# Patient Record
Sex: Female | Born: 1990 | Hispanic: No | Marital: Married | Smoking: Never smoker
Health system: Southern US, Community
[De-identification: ages and names within clinical notes are randomized; demographics above are authoritative.]

## PROBLEM LIST (undated history)

## (undated) ENCOUNTER — Emergency Department (HOSPITAL_COMMUNITY): Admission: EM | Payer: Medicaid Other

## (undated) DIAGNOSIS — O24419 Gestational diabetes mellitus in pregnancy, unspecified control: Secondary | ICD-10-CM

## (undated) DIAGNOSIS — Z789 Other specified health status: Secondary | ICD-10-CM

## (undated) DIAGNOSIS — O139 Gestational [pregnancy-induced] hypertension without significant proteinuria, unspecified trimester: Secondary | ICD-10-CM

## (undated) HISTORY — DX: Gestational (pregnancy-induced) hypertension without significant proteinuria, unspecified trimester: O13.9

## (undated) HISTORY — PX: MANDIBLE SURGERY: SHX707

## (undated) HISTORY — DX: Gestational diabetes mellitus in pregnancy, unspecified control: O24.419

---

## 2018-09-07 ENCOUNTER — Ambulatory Visit (HOSPITAL_COMMUNITY)
Admission: EM | Admit: 2018-09-07 | Discharge: 2018-09-07 | Disposition: A | Discharge status: Home / Self Care | Payer: BLUE CROSS/BLUE SHIELD | Attending: Emergency Medicine | Admitting: Emergency Medicine

## 2018-09-07 ENCOUNTER — Other Ambulatory Visit: Payer: Self-pay

## 2018-09-07 ENCOUNTER — Encounter (HOSPITAL_COMMUNITY): Payer: Self-pay | Admitting: Emergency Medicine

## 2018-09-07 DIAGNOSIS — M545 Low back pain, unspecified: Secondary | ICD-10-CM

## 2018-09-07 DIAGNOSIS — R109 Unspecified abdominal pain: Secondary | ICD-10-CM | POA: Diagnosis not present

## 2018-09-07 DIAGNOSIS — Z3202 Encounter for pregnancy test, result negative: Secondary | ICD-10-CM

## 2018-09-07 DIAGNOSIS — Z789 Other specified health status: Secondary | ICD-10-CM | POA: Diagnosis not present

## 2018-09-07 LAB — POCT PREGNANCY, URINE: Preg Test, Ur: NEGATIVE

## 2018-09-07 NOTE — ED Provider Notes (Signed)
[facility]    CSN: [id number] Arrival date & time: 09/07/18  1528     History   Chief Complaint Chief Complaint  Patient presents with  . Back Pain  . Abdominal Pain    HPI Deborah Lawson is a 28 y.o. female significant medical history presenting for low back and lower abdominal pain.  Patient states symptom onset was 2 days ago.  Endorses dull, aching sensation without radiation.  Patient requesting pregnancy test: Currently sexually active with one female partner, not routinely wearing condoms.  Patient is intentionally try to get pregnant, discontinued her OCPs last month.  LMP 5/26.  Patient not currently taking prenatal vitamin.  Patient endorsing nausea, breast tenderness.  Denies vomiting or nipple discharge or changes - no pelvic/vaginal pain, vaginal discharge or bleeding.  Denies urinary frequency, urgency, dysuria, hematuria.  Patient bowel habit, hematochezia, melena.   History reviewed. No pertinent past medical history.  There are no active problems to display for this patient.   History reviewed. No pertinent surgical history.  OB History   No obstetric history on file.      Home Medications    Prior to Admission medications   Not on File    Family History No family history on file.  Social History Social History   Tobacco Use  . Smoking status: Not on file  Substance Use Topics  . Alcohol use: Not on file  . Drug use: Not on file     Allergies   Patient has no known allergies.   Review of Systems As per HPI   Physical Exam Triage Vital Signs ED Triage Vitals  Enc Vitals Group     BP 09/07/18 1542 119/78     Pulse Rate 09/07/18 1542 (!) 103     Resp 09/07/18 1542 18     Temp 09/07/18 1542 98.9 F (37.2 C)     Temp Source 09/07/18 1542 Oral     SpO2 09/07/18 1542 99 %     Weight --      Height --      Head Circumference --      Peak Flow --      Pain Score 09/07/18 1546 8     Pain Loc --      Pain Edu? --      Excl.  in [place]? --    No data found.  Updated Vital Signs BP 119/78 (BP Location: Left Arm)   Pulse (!) 103   Temp 98.9 F (37.2 C) (Oral)   Resp 18   LMP 08/08/2018   SpO2 99%   Visual Acuity Right Eye Distance:   Left Eye Distance:   Bilateral Distance:    Right Eye Near:   Left Eye Near:    Bilateral Near:     Physical Exam Vitals signs and nursing note reviewed.  Constitutional:      General: She is not in acute distress. HENT:     Head: Normocephalic and atraumatic.  Eyes:     General: No scleral icterus.    Pupils: Pupils are equal, round, and reactive to light.  Cardiovascular:     Rate and Rhythm: Normal rate.  Pulmonary:     Effort: Pulmonary effort is normal.  Abdominal:     General: Bowel sounds are normal.     Palpations: Abdomen is soft. There is no hepatomegaly or splenomegaly.     Tenderness: There is no abdominal tenderness. There is no right CVA tenderness, left CVA tenderness  or guarding.  Skin:    Coloration: Skin is not jaundiced or pale.  Neurological:     Mental Status: She is alert and oriented to person, place, and time.      UC Treatments / Results  Labs (all labs ordered are listed, but only abnormal results are displayed) Labs Reviewed  POC URINE PREG, ED  POCT PREGNANCY, URINE    EKG None  Radiology No results found.  Procedures Procedures (including critical care time)  Medications Ordered in UC Medications - No data to display  Initial Impression / Assessment and Plan / UC Course  I have reviewed the triage vital signs and the nursing notes.  Pertinent labs & imaging results that were available during my care of the patient were reviewed by me and considered in my medical decision making (see chart for details).     28 year old female presenting for 2-day course of low back and abdominal ache.  Patient requesting pregnancy test, actively trying to conceive.  Urine pregnancy test in house was negative.  Patient encouraged  to take daily prenatal vitamin, use OTC NSAIDs, Tylenol, heat for additional pain relief.  Return precautions discussed, patient verbalized understanding. Final Clinical Impressions(s) / UC Diagnoses   Final diagnoses:  Trying to get pregnant  Acute bilateral low back pain without sciatica     Discharge Instructions     Important to take prenatal vitamin daily when trying to conceive. Nasal OTC Tylenol, ibuprofen and hot compresses for pain relief. Recommend that you take, urine pregnancy test if late for your cycle. Return if you develop worsening pain, prolonged vaginal bleeding, fever.    ED Prescriptions    None     Controlled Substance Prescriptions [place] Controlled Substance Registry consulted? Not Applicable   Shea Evans, [place] 09/07/18 1612

## 2018-09-07 NOTE — Discharge Instructions (Addendum)
Important to take prenatal vitamin daily when trying to conceive. Nasal OTC Tylenol, ibuprofen and hot compresses for pain relief. Recommend that you take, urine pregnancy test if late for your cycle. Return if you develop worsening pain, prolonged vaginal bleeding, fever.

## 2018-09-07 NOTE — ED Triage Notes (Signed)
Lower back pain and lower abdominal pain for 2 days.   No pain with urination No increase in frequency Last BM was yesterday and was normal Patient has vaginal discharge.  Onset less than a month

## 2018-11-24 ENCOUNTER — Encounter (HOSPITAL_COMMUNITY): Payer: Self-pay | Admitting: Emergency Medicine

## 2018-11-24 ENCOUNTER — Other Ambulatory Visit: Payer: Self-pay

## 2018-11-24 ENCOUNTER — Ambulatory Visit (HOSPITAL_COMMUNITY)
Admission: EM | Admit: 2018-11-24 | Discharge: 2018-11-24 | Disposition: A | Discharge status: Home / Self Care | Payer: BLUE CROSS/BLUE SHIELD | Attending: Emergency Medicine | Admitting: Emergency Medicine

## 2018-11-24 DIAGNOSIS — R310 Gross hematuria: Secondary | ICD-10-CM | POA: Insufficient documentation

## 2018-11-24 LAB — POCT PREGNANCY, URINE: Preg Test, Ur: NEGATIVE

## 2018-11-24 MED ORDER — CEPHALEXIN 500 MG PO CAPS: 500.0000 mg | ORAL_CAPSULE | Freq: Two times a day (BID) | ORAL | 0 refills | Status: AC

## 2018-11-24 NOTE — Discharge Instructions (Signed)
Negative for pregnancy, recheck after 9/22 or if missed period.  We are culturing your urine to check for infection, with antibiotics initiated in the mean time.  If symptoms worsen or do not improve in the next week to return to be seen or to follow up with your PCP.

## 2018-11-24 NOTE — ED Notes (Signed)
Urinalysis testing exceeded the limitations of the [place] [facility], notified profider

## 2018-11-24 NOTE — ED Provider Notes (Signed)
[facility]    CSN: [id number] Arrival date & time: 11/24/18  1152      History   Chief Complaint Chief Complaint  Patient presents with  . Hematuria    HPI Deborah Lawson is a 28 y.o. female.   Kyrstal Tyndall presents with complaints of blood to urine which she noted this morning. States she was previously experiencing pain and frequency with urination, some pelvic discomfort and back pain, but these have resolved. No current abdominal or back pain. No specific known vaginal bleeding. LMP 8/22. She is not on birth control, she is trying for pregnancy. She has two living children. No fevers. No known vaginal discharge. Denies any previous similar.    Arabic audio interpreter used to collect history and physical.   ROS per HPI, negative if not otherwise mentioned.      History reviewed. No pertinent past medical history.  There are no active problems to display for this patient.   History reviewed. No pertinent surgical history.  OB History   No obstetric history on file.      Home Medications    Prior to Admission medications   Medication Sig Start Date End Date Taking? Authorizing Provider  cephALEXin (KEFLEX) 500 MG capsule Take 1 capsule (500 mg total) by mouth 2 (two) times daily for 7 days. 11/24/18 12/01/18  Georgetta Haber, NP    Family History No family history on file.  Social History Social History   Tobacco Use  . Smoking status: Not on file  Substance Use Topics  . Alcohol use: Not on file  . Drug use: Not on file     Allergies   Patient has no known allergies.   Review of Systems Review of Systems   Physical Exam Triage Vital Signs ED Triage Vitals  Enc Vitals Group     BP 11/24/18 1242 131/66     Pulse Rate 11/24/18 1242 100     Resp 11/24/18 1242 18     Temp 11/24/18 1242 98 F (36.7 C)     Temp src --      SpO2 11/24/18 1242 100 %     Weight --      Height --      Head Circumference --      Peak Flow --      Pain  Score 11/24/18 1247 8     Pain Loc --      Pain Edu? --      Excl. in [place]? --    No data found.  Updated Vital Signs BP 131/66   Pulse 100   Temp 98 F (36.7 C)   Resp 18   LMP 11/10/2018   SpO2 100%    Physical Exam Constitutional:      General: She is not in acute distress.    Appearance: She is well-developed.  Cardiovascular:     Rate and Rhythm: Normal rate.  Pulmonary:     Effort: Pulmonary effort is normal.  Abdominal:     Tenderness: There is no abdominal tenderness. There is no right CVA tenderness or left CVA tenderness.  Genitourinary:    Labia:        Right: No rash.        Left: No rash.      Cervix: No cervical motion tenderness, friability, erythema or cervical bleeding.  Skin:    General: Skin is warm and dry.  Neurological:     Mental Status: She is alert and oriented  to person, place, and time.      UC Treatments / Results  Labs (all labs ordered are listed, but only abnormal results are displayed) Labs Reviewed  URINE CULTURE  POC URINE PREG, ED  POCT PREGNANCY, URINE  CERVICOVAGINAL ANCILLARY ONLY    EKG   Radiology No results found.  Procedures Procedures (including critical care time)  Medications Ordered in UC Medications - No data to display  Initial Impression / Assessment and Plan / UC Course  I have reviewed the triage vital signs and the nursing notes.  Pertinent labs & imaging results that were available during my care of the patient were reviewed by me and considered in my medical decision making (see chart for details).     Urine dip was too significant to run through device for treatment, sample was denied from the machine, per visual gross exam with strip and control appears large hgb, large leuks and high specific gravity. Treatment for UTI initiate with culture pending. Negative for pregnancy, encouraged recheck after missed period. Return precautions provided. Patient verbalized understanding and agreeable to  plan.    Final Clinical Impressions(s) / UC Diagnoses   Final diagnoses:  Gross hematuria     Discharge Instructions     Negative for pregnancy, recheck after 9/22 or if missed period.  We are culturing your urine to check for infection, with antibiotics initiated in the mean time.  If symptoms worsen or do not improve in the next week to return to be seen or to follow up with your PCP.     ED Prescriptions    Medication Sig Dispense Auth. Provider   cephALEXin (KEFLEX) 500 MG capsule Take 1 capsule (500 mg total) by mouth 2 (two) times daily for 7 days. 14 capsule Zigmund Gottron, NP     Controlled Substance Prescriptions [place] Controlled Substance Registry consulted? Not Applicable   Zigmund Gottron, NP 11/25/18 [phone]

## 2018-11-24 NOTE — ED Triage Notes (Signed)
With arabic interpreter. "I have blood in my urine starting today" pt states the urine was pink in the toilet. C/o lower back and pelvic pain.

## 2018-11-25 LAB — URINE CULTURE: Culture: 60000 — AB

## 2018-11-28 LAB — CERVICOVAGINAL ANCILLARY ONLY
Bacterial vaginitis: NEGATIVE
Candida vaginitis: NEGATIVE
Chlamydia: NEGATIVE
Neisseria Gonorrhea: NEGATIVE
Trichomonas: NEGATIVE

## 2019-05-03 ENCOUNTER — Ambulatory Visit (HOSPITAL_COMMUNITY): Payer: Medicaid Other

## 2019-05-15 ENCOUNTER — Ambulatory Visit (HOSPITAL_COMMUNITY): Payer: Self-pay | Admitting: Genetic Counselor

## 2019-05-15 ENCOUNTER — Ambulatory Visit (HOSPITAL_COMMUNITY): Payer: Medicaid Other | Attending: Obstetrics and Gynecology | Admitting: Genetic Counselor

## 2019-05-15 ENCOUNTER — Other Ambulatory Visit: Payer: Self-pay

## 2019-05-15 DIAGNOSIS — Z36 Encounter for antenatal screening for chromosomal anomalies: Secondary | ICD-10-CM

## 2019-05-15 DIAGNOSIS — Z315 Encounter for genetic counseling: Secondary | ICD-10-CM | POA: Diagnosis not present

## 2019-05-15 DIAGNOSIS — Z843 Family history of consanguinity: Secondary | ICD-10-CM | POA: Diagnosis not present

## 2019-05-15 DIAGNOSIS — Z3A15 15 weeks gestation of pregnancy: Secondary | ICD-10-CM | POA: Diagnosis not present

## 2019-05-15 DIAGNOSIS — Z31430 Encounter of female for testing for genetic disease carrier status for procreative management: Secondary | ICD-10-CM

## 2019-05-15 NOTE — Progress Notes (Signed)
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 Deborah Lawson was accompanied to her appointment by a [place] interpreter.   Indication for genetic counseling - History of consanguinity  Prenatal history  Deborah Lawson is a [id number], 29 y.o. year old female. Her current pregnancy has completed [redacted]w[redacted]d (Estimated Date of Delivery: 10/31/19).  Deborah Lawson denied exposure to environmental toxins or chemical agents. She denied the use of alcohol, tobacco or street drugs. She reported taking prenatal vitamins. She denied significant viral illnesses, fevers, and bleeding during the course of her pregnancy. Her medical and surgical histories were noncontributory.  Family History  A three generation pedigree was drafted and reviewed. The family history is remarkable for the following:  - Deborah Lawson's maternal grandfather is first cousins with her husband's maternal grandfather. This makes Deborah Lawson and her husband third cousins to one another. See Discussion section for more details.  The remaining family histories were reviewed and found to be noncontributory for birth defects, intellectual disability, recurrent pregnancy loss, and known genetic conditions.    The patient's ethnicity is [place]. The father of the pregnancy's ethnicity is [place]. Ashkenazi Jewish ancestry was denied. Pedigree will be scanned under Media.  Discussion  Deborah Lawson was referred for genetic counseling due to a history of consanguinity, as she and her husband are third cousins.  We discussed that children born to a consanguineous couple are at increased risk for genetic health problems. This increase in risk is related to the possibility of passing on recessive genes. Every person carries approximately 5-10 non-working genes that, when received in a double dose, result in  recessive genetic conditions. In general, unrelated couples have a relatively low risk of having a child with a recessive condition because the likelihood of both parents carrying the same non-working recessive gene is very low. However, when a couple is related, they have inherited some of their genetic information from the same family member, which leads to an increased chance that they may carry the same recessive gene and thus may have a child with a recessive condition.   For third cousin unions, the chance of having a child with a birth defect, intellectual disability, or genetic condition is increased slightly above the general population risk of 3-5%. Third cousins share ~0.78% (1/128) of their genetic material.Children ofthirdcousin unions are homozygous at 0.39% (1/256) of their gene loci. If these loci are associated with recessive genetic conditions, then there is a chance that the children will be affected by that condition.If Deborah Lawson and her husband were to carry the same recessive condition, they would have a 1 in 4 (25%) chance of having a child with that condition.  Wediscussed that expanded carrier screening(ECS)isatesting option that evaluates an individual's carrier status for>100autosomal recessive orX-linked genetic conditions. Some of the conditions included on ECSarerare while others may occur more commonly. Some conditions may be severe and actionable, whereas other conditions may not yet be well-understood or may not have treatment optionsavailable.Inthe event that an individualwerefound to be a carrier for one or more conditions, carrier screening would be recommended for their partner for those conditions. This could help determine whether a couple's pregnancies are at increased risk for a particular recessivegenetic condition.Additionally, carrier status for certain conditions may have health implications for the carrier themselves. We discussed the risks,  benefits, and limitations of ECS. After thoughtful  consideration of her options,Deborah Lawson indicated that she was interested in pursuing ECS. She understands that she likely will be identified as a carrier for a condition, and that if she is found to be a carrier, follow-up screening for her husband will be recommended.  We also reviewed noninvasive prenatal screening (NIPS) as an available screening option for chromosomal aneuploidies unrelated to a couple's risk for recessive conditions. NIPS analyzes cell free DNA originating from the placenta that is found in the maternal blood circulation during pregnancy. This test is not diagnostic for chromosome conditions, but can provide information regarding the presence or absence of extra fetal DNA for chromosomes 13, 18, 21, and the sex chromosomes. Thus, it would not identify or rule out all fetal aneuploidy. The reported detection rate is 91-99% for trisomies21, 18, 13, and sex chromosome aneuploidies. The false positive rate is reported to be less than 0.1% for any of these conditions. Deborah Lawson indicated that she is interested in undergoing NIPS.  Deborah Lawson was also counseled regarding diagnostic testing via amniocentesis. We discussed the technical aspects of the procedure and quoted up to a 1 in 500 (0.2%) risk for spontaneous pregnancy loss or other adverse pregnancy outcomes as a result of amniocentesis. Cultured cells from an amniocentesis sample allow for the visualization of a fetal karyotype, which can detect >99% of chromosomal aberrations. Chromosomal microarray can also be performed to identify smaller deletions or duplications of fetal chromosomal material. Amniocentesis could also be performed to assess whether the baby is affected by various single gene conditions. If Deborah Lawson and her husband were identified as carriers for the same condition, an amniocentesis would be able to determine if the current fetus is affected by the condition in  question. After careful consideration, Deborah Lawson declined amniocentesis at this time. She understands that amniocentesis is available at any point after 16 weeks of pregnancy and that she may opt to undergo the procedure at a later date should she change her mind.  Lastly, screening for open neural tube defects (ONTDs) via MS-AFP in the second trimester in addition to level II ultrasound examination is recommended. Level II ultrasound and MS-AFP are able to detect ONTDs with 90-95% sensitivity. However, normal results from ultrasound and MS-AFP screening do not guarantee a normal baby, as 3-5% of newborns have some type of birth defect, many of which are not prenatally diagnosable.  Deborah Lawson had her blood drawn for MaterniT21 NIPS and Inheritest-144 expanded carrier screening following our appointment. Results from NIPS will take 5-7 days to be returned. Inheritest results will take 2-3 weeks to be returned. I will call Deborah Lawson when results become available.  I counseled Deborah Lawson regarding the above risks and available options. The approximate face-to-face time with the genetic counselor was 40 minutes.  In summary:  Reviewed family history concerns  Patient and her husband are third cousins  Discussed expanded carrier screening for recessive conditions  Third cousin unions are at increased risk to have a child with a recessive condition  Opted to undergo Inheritest-144 expanded carrier screening. We will follow results  Offered additional screening/testing options  Opted to undergo MaterniT21 NIPS. We will follow results  Declined amniocentesis  Recommend MS-AFP screening be ordered around 16-18 weeks   Gershon Crane, MS, [facility] Genetic Counselor

## 2019-05-22 ENCOUNTER — Telehealth (HOSPITAL_COMMUNITY): Payer: Self-pay | Admitting: Genetic Counselor

## 2019-05-22 NOTE — Telephone Encounter (Signed)
LVM for Ms. Malanga with the help of [place], ID# [phone], re: good news about screening results. Requested a call back to my direct line to discuss these in more detail, as no identifiers were provided in voicemail message. I will attempt to call again tomorrow if I do not hear back from Ms. Stumpe today.   Gershon Crane, MS, [facility] Genetic Counselor

## 2019-05-23 ENCOUNTER — Telehealth (HOSPITAL_COMMUNITY): Payer: Self-pay | Admitting: Genetic Counselor

## 2019-05-23 NOTE — Telephone Encounter (Signed)
Attempted to call Ms. Reichel with the help of [place], [phone], to review Ms. Opdahl's negative noninvasive prenatal screening results. However, there was still no answer.

## 2019-05-23 NOTE — Telephone Encounter (Signed)
Received call back from Ms. Brzozowski to discuss her NIPS results. The telephone call was facilitated by a [place], ID# [phone]. Ms. Boyar had negative MaterniT21 NIPS through [facility]. These negative results demonstrated an expected representation of chromosome 57, 75, 50, and sex chromosome material, greatly reducing the likelihood of trisomies 85, 31, or 61 and sex chromosome aneuploidies for the pregnancy. Ms. Faulconer requested to know about the expected fetal sex, which is female.  NIPS analyzes placental (fetal) DNA in maternal circulation. NIPS is considered to be highly specific and sensitive, but is not considered to be a diagnostic test. We reviewed that this testing identifies 91-99% of pregnancies with trisomies 62, 34, and 84, as well as sex chromosome abnormalities, but does not test for all genetic conditions. Diagnostic testing via amniocentesis is available should she be interested in confirming this result.   We reviewed that results from expanded carrier screening (ECS) are not yet available. I will call Ms. Rigor once those results are returned. She confirmed that she had no further questions at this time.  Gershon Crane, MS, [facility] Genetic Counselor

## 2019-05-31 ENCOUNTER — Encounter (HOSPITAL_COMMUNITY): Payer: Self-pay

## 2019-05-31 ENCOUNTER — Inpatient Hospital Stay (HOSPITAL_COMMUNITY)
Admission: AD | Admit: 2019-05-31 | Discharge: 2019-05-31 | Disposition: A | Discharge status: Home / Self Care | Payer: Medicaid Other | Attending: Obstetrics & Gynecology | Admitting: Obstetrics & Gynecology

## 2019-05-31 ENCOUNTER — Other Ambulatory Visit: Payer: Self-pay

## 2019-05-31 ENCOUNTER — Ambulatory Visit (HOSPITAL_COMMUNITY)
Admission: EM | Admit: 2019-05-31 | Discharge: 2019-05-31 | Payer: Medicaid Other | Attending: Family Medicine | Admitting: Family Medicine

## 2019-05-31 ENCOUNTER — Encounter (HOSPITAL_COMMUNITY): Payer: Self-pay | Admitting: Emergency Medicine

## 2019-05-31 DIAGNOSIS — R1030 Lower abdominal pain, unspecified: Secondary | ICD-10-CM | POA: Insufficient documentation

## 2019-05-31 DIAGNOSIS — M545 Low back pain: Secondary | ICD-10-CM | POA: Diagnosis not present

## 2019-05-31 DIAGNOSIS — O26899 Other specified pregnancy related conditions, unspecified trimester: Secondary | ICD-10-CM

## 2019-05-31 DIAGNOSIS — O26892 Other specified pregnancy related conditions, second trimester: Secondary | ICD-10-CM | POA: Insufficient documentation

## 2019-05-31 DIAGNOSIS — R109 Unspecified abdominal pain: Secondary | ICD-10-CM | POA: Diagnosis not present

## 2019-05-31 DIAGNOSIS — Z3A18 18 weeks gestation of pregnancy: Secondary | ICD-10-CM | POA: Insufficient documentation

## 2019-05-31 LAB — WET PREP, GENITAL
Clue Cells Wet Prep HPF POC: NONE SEEN
Sperm: NONE SEEN
Trich, Wet Prep: NONE SEEN
Yeast Wet Prep HPF POC: NONE SEEN

## 2019-05-31 LAB — POCT URINALYSIS DIP (DEVICE)
Bilirubin Urine: NEGATIVE
Glucose, UA: NEGATIVE mg/dL
Hgb urine dipstick: NEGATIVE
Ketones, ur: NEGATIVE mg/dL
Nitrite: NEGATIVE
Protein, ur: NEGATIVE mg/dL
Specific Gravity, Urine: 1.02 (ref 1.005–1.030)
Urobilinogen, UA: 0.2 mg/dL (ref 0.0–1.0)
pH: 8.5 — ABNORMAL HIGH (ref 5.0–8.0)

## 2019-05-31 LAB — CBC
HCT: 34.8 % — ABNORMAL LOW (ref 36.0–46.0)
Hemoglobin: 11.6 g/dL — ABNORMAL LOW (ref 12.0–15.0)
MCH: 26.9 pg (ref 26.0–34.0)
MCHC: 33.3 g/dL (ref 30.0–36.0)
MCV: 80.6 fL (ref 80.0–100.0)
Platelets: 365 10*3/uL (ref 150–400)
RBC: 4.32 MIL/uL (ref 3.87–5.11)
RDW: 17 % — ABNORMAL HIGH (ref 11.5–15.5)
WBC: 9.2 10*3/uL (ref 4.0–10.5)
nRBC: 0 % (ref 0.0–0.2)

## 2019-05-31 LAB — POC URINE PREG, ED: Preg Test, Ur: POSITIVE — AB

## 2019-05-31 LAB — URINALYSIS, ROUTINE W REFLEX MICROSCOPIC
Bilirubin Urine: NEGATIVE
Glucose, UA: NEGATIVE mg/dL
Hgb urine dipstick: NEGATIVE
Ketones, ur: NEGATIVE mg/dL
Nitrite: NEGATIVE
Protein, ur: NEGATIVE mg/dL
Specific Gravity, Urine: 1.006 (ref 1.005–1.030)
pH: 7 (ref 5.0–8.0)

## 2019-05-31 LAB — POCT PREGNANCY, URINE: Preg Test, Ur: POSITIVE — AB

## 2019-05-31 NOTE — ED Triage Notes (Signed)
Pt states she has some back and stomach pain x 3 days.

## 2019-05-31 NOTE — ED Provider Notes (Signed)
MSE was initiated and I personally evaluated the patient and placed orders (if any) at  4:11 PM on May 31, 2019.  The patient appears stable so that the remainder of the MSE may be completed by another provider.  Chief Complaint: abd pain, back pain  HPI:   29 y/o F currently ~[redacted] wks pregnant presenting for eval of abd pain and back pain x3 days. Has had intermittent fevers as well.   ROS: abd pain, back pain, fever (one)  Physical Exam:   Gen: No distress  Neuro: Awake and Alert  Skin: Warm    Focused Exam: gravid abdomen. Diffuse abd TTP. abd is soft.    Initiation of care has begun. The patient has been counseled on the process, plan, and necessity for staying for the completion/evaluation, and the remainder of the medical screening examination  4:10 PM Spoke with Natalia Leatherwood, [facility] APP who accepts patient for transfer to the [facility]   Karrie Meres, PA-C 05/31/19 1611    Charlynne Pander, MD 06/01/19 [phone]

## 2019-05-31 NOTE — ED Provider Notes (Signed)
[facility]    CSN: [id number] Arrival date & time: 05/31/19  1433      History   Chief Complaint Chief Complaint  Patient presents with  . Back Pain  . Abdominal Pain    HPI Deborah Lawson is a 29 y.o. female.   Patient who is  pregnant reported to urgent care for abdominal and back pain.  This pain started 3 days ago.  She initially points to her lower abdomen however does endorse pain in her other parts of her abdomen as well.  She denies any vaginal bleeding.  Denies painful urination or frequent urination.  She is also felt like she has had a fever.    An Arabic interpreter was utilized.     History reviewed. No pertinent past medical history.  There are no problems to display for this patient.   History reviewed. No pertinent surgical history.  OB History   No obstetric history on file.      Home Medications    Prior to Admission medications   Not on File    Family History History reviewed. No pertinent family history.  Social History Social History   Tobacco Use  . Smoking status: Never Smoker  . Smokeless tobacco: Never Used  Substance Use Topics  . Alcohol use: Never  . Drug use: Never     Allergies   Patient has no known allergies.   Review of Systems Review of Systems  Constitutional: Positive for fever. Negative for chills.  Eyes: Negative for pain and visual disturbance.  Respiratory: Negative for cough and shortness of breath.   Cardiovascular: Negative for chest pain and palpitations.  Gastrointestinal: Positive for abdominal pain. Negative for vomiting.  Genitourinary: Negative for dysuria, flank pain, frequency, hematuria and urgency.  Musculoskeletal: Positive for back pain. Negative for arthralgias.  Skin: Negative for color change and rash.  All other systems reviewed and are negative.    Physical Exam Triage Vital Signs ED Triage Vitals  Enc Vitals Group     BP 05/31/19 1457 132/72     Pulse Rate  05/31/19 1457 98     Resp 05/31/19 1457 18     Temp 05/31/19 1457 98.5 F (36.9 C)     Temp Source 05/31/19 1457 Oral     SpO2 05/31/19 1457 100 %     Weight 05/31/19 1459 195 lb (88.5 kg)     Height --      Head Circumference --      Peak Flow --      Pain Score 05/31/19 1456 5     Pain Loc --      Pain Edu? --      Excl. in GC? --    No data found.  Updated Vital Signs BP 132/72 (BP Location: Right Arm)   Pulse 98   Temp 98.5 F (36.9 C) (Oral)   Resp 18   Wt 195 lb (88.5 kg)   LMP 01/31/2019   SpO2 100%   Visual Acuity Right Eye Distance:   Left Eye Distance:   Bilateral Distance:    Right Eye Near:   Left Eye Near:    Bilateral Near:     Physical Exam Vitals and nursing note reviewed.  Constitutional:      General: She is not in acute distress.    Appearance: She is well-developed. She is not ill-appearing.  HENT:     Head: Normocephalic and atraumatic.  Eyes:     Conjunctiva/sclera: Conjunctivae normal.  Cardiovascular:     Rate and Rhythm: Normal rate and regular rhythm.     Heart sounds: No murmur.  Pulmonary:     Effort: Pulmonary effort is normal. No respiratory distress.     Breath sounds: Normal breath sounds.  Abdominal:     Tenderness: There is generalized abdominal tenderness and tenderness in the right lower quadrant, periumbilical area, suprapubic area and left lower quadrant.     Comments: Visibly pregnant  Musculoskeletal:     Cervical back: Neck supple.  Skin:    General: Skin is warm and dry.  Neurological:     Mental Status: She is alert.      UC Treatments / Results  Labs (all labs ordered are listed, but only abnormal results are displayed) Labs Reviewed  POC URINE PREG, ED - Abnormal; Notable for the following components:      Result Value   Preg Test, Ur POSITIVE (*)    All other components within normal limits  POCT PREGNANCY, URINE - Abnormal; Notable for the following components:   Preg Test, Ur POSITIVE (*)    All  other components within normal limits    EKG   Radiology No results found.  Procedures Procedures (including critical care time)  Medications Ordered in UC Medications - No data to display  Initial Impression / Assessment and Plan / UC Course  I have reviewed the triage vital signs and the nursing notes.  Pertinent labs & imaging results that were available during my care of the patient were reviewed by me and considered in my medical decision making (see chart for details).     #Abdominal pain in pregnancy Pregnant patient presents for evaluation of abdominal and back pain.  According to recent genetic note on 05/18/2019 she was almost [redacted] weeks pregnant at that time.  Placing her at roughly [redacted] weeks pregnant now.  She initially had suprapubic complaints however she also complains of tenderness throughout her abdomen.  This was reported we discussed that would be best for her to be evaluated at the women's center at [facility].  And that she should report through the emergency department.  Patient did become tearful at this time. -Recommended patient go to be evaluated for her abdominal pain at the women's center.  Arabic interpreter was utilized to ensure she understood.  Patient did begin to leave the room and shook her head that she would go. Final Clinical Impressions(s) / UC Diagnoses   Final diagnoses:  Abdominal pain in pregnancy, unspecified trimester     Discharge Instructions     It will be best for you to go to the [facility] center to be further evaluated as they can also evaluate how your pregnancy and baby are doing.     ED Prescriptions    None     PDMP not reviewed this encounter.   Purnell Shoemaker, PA-C 05/31/19 1537

## 2019-05-31 NOTE — ED Notes (Signed)
Called Pa in [place] zone to assess for pt to go MAU

## 2019-05-31 NOTE — ED Triage Notes (Signed)
Pt states she has been having lower abd pain and back pain for 3 days. [redacted] weeks pregnant. This is her 3rd healthy pregnancy. Denies any vaginal bleeding, denies any gush of fluid.

## 2019-05-31 NOTE — MAU Note (Signed)
Having pain in her abd and back, started on Tues, increased a lot today. Has become constant. No bleeding.  Going to [facility]

## 2019-05-31 NOTE — Discharge Instructions (Signed)
It will be best for you to go to the [facility] center to be further evaluated as they can also evaluate how your pregnancy and baby are doing.

## 2019-05-31 NOTE — Discharge Instructions (Signed)

## 2019-05-31 NOTE — MAU Provider Note (Signed)
History     CSN: [id number]  Arrival date and time: 05/31/19 1538   First Provider Initiated Contact with Patient 05/31/19 1746      Chief Complaint  Patient presents with  . Abdominal Pain    [redacted] weeks pregnant  . Back Pain   29 y.o. [id number] @18 .1 wks presenting with abd pain and LBP. Sx started 2 days ago. Abdominal pain is upper and lower, bilateral. Describes as constant and aching. Rates 8/10. She took Tylenol and it helped. Denies fevers. No GI sx. Last BM today. Denies urinary sx.   OB History    Gravida  3   Para  2   Term  2   Preterm  0   AB  0   Living  2     SAB  0   TAB  0   Ectopic  0   Multiple  0   Live Births  2           History reviewed. No pertinent past medical history.  History reviewed. No pertinent surgical history.  No family history on file.  Social History   Tobacco Use  . Smoking status: Never Smoker  . Smokeless tobacco: Never Used  Substance Use Topics  . Alcohol use: Never  . Drug use: Never    Allergies: No Known Allergies  No medications prior to admission.    Review of Systems  Constitutional: Negative for chills and fever.  Gastrointestinal: Positive for abdominal pain. Negative for constipation, diarrhea, nausea and vomiting.  Genitourinary: Negative for dysuria, hematuria, urgency, vaginal bleeding and vaginal discharge.  Musculoskeletal: Positive for back pain.   Physical Exam   Blood pressure 117/74, pulse 100, temperature 98.6 F (37 C), temperature source Oral, resp. rate 18, weight 90.9 kg, last menstrual period 01/24/2019, SpO2 100 %.  Physical Exam  Nursing note and vitals reviewed. Constitutional: She is oriented to person, place, and time. She appears well-developed and well-nourished. No distress.  HENT:  Head: Normocephalic and atraumatic.  Cardiovascular: Normal rate.  Respiratory: Effort normal. No respiratory distress.  GI: Soft. She exhibits no distension and no mass. There is no  abdominal tenderness. There is no rebound, no guarding and no CVA tenderness.  Genitourinary:    Genitourinary Comments: External: no lesions or erythema, female circumcision present Uterus: + enlarged, anteverted, non tender, no CMT Cervix closed/long    Musculoskeletal:        General: Normal range of motion.     Cervical back: Normal range of motion. Normal.     Thoracic back: Normal.     Lumbar back: Normal.  Neurological: She is alert and oriented to person, place, and time.  Skin: Skin is warm and dry.  Psychiatric: She has a normal mood and affect.  FHT 147  Results for orders placed or performed during the hospital encounter of 05/31/19 (from the past 24 hour(s))  POCT urinalysis dip (device)     Status: Abnormal   Collection Time: 05/31/19  2:07 PM  Result Value Ref Range   Glucose, UA NEGATIVE NEGATIVE mg/dL   Bilirubin Urine NEGATIVE NEGATIVE   Ketones, ur NEGATIVE NEGATIVE mg/dL   Specific Gravity, Urine 1.020 1.005 - 1.030   Hgb urine dipstick NEGATIVE NEGATIVE   pH 8.5 (H) 5.0 - 8.0   Protein, ur NEGATIVE NEGATIVE mg/dL   Urobilinogen, UA 0.2 0.0 - 1.0 mg/dL   Nitrite NEGATIVE NEGATIVE   Leukocytes,Ua LARGE (A) NEGATIVE  POC urine pregnancy  Status: Abnormal   Collection Time: 05/31/19  3:09 PM  Result Value Ref Range   Preg Test, Ur POSITIVE (A) NEGATIVE  Pregnancy, urine POC     Status: Abnormal   Collection Time: 05/31/19  3:09 PM  Result Value Ref Range   Preg Test, Ur POSITIVE (A) NEGATIVE   MAU Course  Procedures  MDM Labs ordered and reviewed. Labs normal. UA with leuks, pt asymptomatic, will send UC. Abd pain likely RL. Discussed comfort measures. Stable for discharge home.  Assessment and Plan   1. [redacted] weeks gestation of pregnancy   2. Pain of round ligament during pregnancy    Discharge home Follow up at [facility] as scheduled to start care SAB precautions Tylenol prn  Allergies as of 05/31/2019   No Known Allergies     Medication List     You have not been prescribed any medications.     Video interpreter used for encounter Julianne Handler, CNM 05/31/2019, 6:01 PM

## 2019-05-31 NOTE — ED Notes (Signed)
Patient is being discharged from the Urgent Care Center and sent to the Emergency Department via personal vehicle by family member. Per Provider Cam Hai, patient is stable but in need of higher level of care due to abdominal pain in pregnancy. Patient is aware and verbalizes understanding of plan of care.    Vitals:   05/31/19 1457  BP: 132/72  Pulse: 98  Resp: 18  Temp: 98.5 F (36.9 C)  SpO2: 100%

## 2019-06-01 LAB — GC/CHLAMYDIA PROBE AMP (~~LOC~~) NOT AT ARMC
Chlamydia: NEGATIVE
Comment: NEGATIVE
Comment: NORMAL
Neisseria Gonorrhea: NEGATIVE

## 2019-06-01 LAB — CULTURE, OB URINE: Culture: <10000 — AB

## 2019-06-08 ENCOUNTER — Telehealth (HOSPITAL_COMMUNITY): Payer: Self-pay | Admitting: Genetic Counselor

## 2019-06-08 NOTE — Telephone Encounter (Signed)
Received phone call from Ms. Pink inquiring if her expanded carrier screening (ECS) results were available yet. I indicated that results from her Inheritest were still pending and that I would call her when they become available. She confirmed that she had no further questions at this time.  Gershon Crane, MS, [facility] Genetic Counselor

## 2019-06-19 LAB — MATERNIT21 PLUS CORE+SCA
Fetal Fraction: 5
Monosomy X (Turner Syndrome): NOT DETECTED
Result (T21): NEGATIVE
Trisomy 13 (Patau syndrome): NEGATIVE
Trisomy 18 (Edwards syndrome): NEGATIVE
Trisomy 21 (Down syndrome): NEGATIVE
XXX (Triple X Syndrome): NOT DETECTED
XXY (Klinefelter Syndrome): NOT DETECTED
XYY (Jacobs Syndrome): NOT DETECTED

## 2019-06-19 LAB — INHERITEST COMPREHENSIVE

## 2019-06-20 ENCOUNTER — Telehealth (HOSPITAL_COMMUNITY): Payer: Self-pay | Admitting: Genetic Counselor

## 2019-06-20 NOTE — Telephone Encounter (Signed)
I called Ms. Ruggieri with the help of [place] [employer]pre[employer] nterpreter [place], [employer] [address]16, to discuss her results from expanded carrier screening (ECS). Specifically, Ms. Rybka had Inheritest-144 ECS performed through [facility] for her history of consanguinity. 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  We discussed that Fragile X syndrome is the most common inherited cause of intellectual disability. Fragile X syndrome is caused by mutations in the FMR1 gene on the X chromosome. Nearly all cases of fragile X syndrome are caused by mutations in the CGG trinucleotide repeat region of the FMR1 gene. Repeat sizes of 45-54 in the CGG trinucleotide segment of the FMR1 gene are considered to be intermediate sized repeats. Repeats of this size are also commonly referred to as the "gray zone". Individuals with repeat alleles that fall in the intermediate range are not at risk of having a child affected by fragile X syndrome, as an intermediate allele is not at risk of expanding to a full mutation (>200 repeats) in one generation. However, it is possible that an intermediate allele may expand into a premutation in the next generation. Premutation carriers are at risk of having a child with fragile X syndrome; thus, Ms. Bacha children may be at risk to have children of their own who are affected by fragile X syndrome. For this reason, it is recommended that Ms. Hemp's children be referred for genetic counseling when/if they are considering having children of their own in the future.  Ms. Doiron was negative for the other mutations analyzed in 144 genes associated with more than 115 autosomal recessive and X-linked disorders (listed separately in the laboratory report). These negative results means that her risk to be a carrier for these conditions has been reduced, but not  fully eliminated. This also significantly reduces the couple's risk of having a child affected by one of these 115 conditions. Ms. Ovitt understands that this test does not assess for all possible genetic conditions.  Given that Ms. Meriwether's results were negative, it is not necessary to perform carrier screening for her husband for any of the 115 conditions that Ms. Luse was tested for at this time. Ms. Finan confirmed that she had no further questions about these results at this time.  Gershon Crane, MS, [facility] Genetic Counselor

## 2019-09-16 ENCOUNTER — Other Ambulatory Visit: Payer: Self-pay

## 2019-09-16 ENCOUNTER — Emergency Department (HOSPITAL_COMMUNITY)
Admission: EM | Admit: 2019-09-16 | Discharge: 2019-09-16 | Disposition: A | Discharge status: Home / Self Care | Payer: Medicaid Other | Attending: Emergency Medicine | Admitting: Emergency Medicine

## 2019-09-16 DIAGNOSIS — T23039A Burn of unspecified degree of unspecified multiple fingers (nail), not including thumb, initial encounter: Secondary | ICD-10-CM | POA: Diagnosis not present

## 2019-09-16 DIAGNOSIS — Y999 Unspecified external cause status: Secondary | ICD-10-CM | POA: Insufficient documentation

## 2019-09-16 DIAGNOSIS — Y929 Unspecified place or not applicable: Secondary | ICD-10-CM | POA: Diagnosis not present

## 2019-09-16 DIAGNOSIS — X158XXA Contact with other hot household appliances, initial encounter: Secondary | ICD-10-CM | POA: Diagnosis not present

## 2019-09-16 DIAGNOSIS — T304 Corrosion of unspecified body region, unspecified degree: Secondary | ICD-10-CM

## 2019-09-16 DIAGNOSIS — Y939 Activity, unspecified: Secondary | ICD-10-CM | POA: Insufficient documentation

## 2019-09-16 MED ORDER — BACITRACIN ZINC 500 UNIT/GM EX OINT: 1.0000 "application " | TOPICAL_OINTMENT | Freq: Two times a day (BID) | CUTANEOUS | 0 refills | Status: DC

## 2019-09-16 MED ORDER — BACITRACIN ZINC 500 UNIT/GM EX OINT: 1.0000 "application " | TOPICAL_OINTMENT | Freq: Two times a day (BID) | CUTANEOUS | Status: DC
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
## 2019-10-12 ENCOUNTER — Encounter: Payer: Self-pay | Admitting: Obstetrics and Gynecology

## 2019-10-12 DIAGNOSIS — Z789 Other specified health status: Secondary | ICD-10-CM | POA: Insufficient documentation

## 2019-10-12 DIAGNOSIS — O329XX0 Maternal care for malpresentation of fetus, unspecified, not applicable or unspecified: Secondary | ICD-10-CM | POA: Insufficient documentation

## 2019-10-12 NOTE — Progress Notes (Signed)
OB Note  See [facility] consult note from today, but, in brief, patient seen there today after u/s yesterday showed breech. Pt elects for 39wk c/s and no ECV attempt now or at her c/s. D/w her that if she's ceph at her c/s that I recommend keeping and inducing  Request sent today for 8/11 c/s  Cornelia Copa MD Attending Center for [facility] (Faculty Practice) 10/12/2019 Time: [phone]

## 2019-10-17 ENCOUNTER — Encounter (HOSPITAL_COMMUNITY): Payer: Self-pay

## 2019-10-17 NOTE — Pre-Procedure Instructions (Signed)
Interpreter number [id number] 

## 2019-10-17 NOTE — Patient Instructions (Signed)
Deborah Lawson  10/17/2019   Your procedure is scheduled on:  10/26/2019  Arrive at 0745 at Entrance C on [address] at [facility]  and [address]. You are invited to use the FREE valet parking or use the Visitor's parking deck.  Pick up the phone at the desk and dial [phone].  Call this number if you have problems the morning of surgery: [phone]  Remember:   Do not eat food:(After Midnight) Desps de medianoche.  Do not drink clear liquids: (After Midnight) Desps de medianoche.  Take these medicines the morning of surgery with A SIP OF WATER:  none   Do not wear jewelry, make-up or nail polish.  Do not wear lotions, powders, or perfumes. Do not wear deodorant.  Do not shave 48 hours prior to surgery.  Do not bring valuables to the hospital.  [facility] is not   responsible for any belongings or valuables brought to the hospital.  Contacts, dentures or bridgework may not be worn into surgery.  Leave suitcase in the car. After surgery it may be brought to your room.  For patients admitted to the hospital, checkout time is 11:00 AM the day of              discharge.      Please read over the following fact sheets that you were given:     Preparing for Surgery

## 2019-10-22 NOTE — H&P (Signed)
Deborah Lawson is an 29 y.o. G3P2002 [redacted]w[redacted]d female.   Chief Complaint: breech presentation HPI: Here for primary C-section in setting of breech. Declines attempts at ECV.  Past Medical History:  Diagnosis Date  . Medical history non-contributory     Past Surgical History:  Procedure Laterality Date  . MANDIBLE SURGERY      Family History  Problem Relation Age of Onset  . Hypertension Father    Social History:  reports that she has never smoked. She has never used smokeless tobacco. She reports that she does not drink alcohol and does not use drugs.   No Known Allergies  No medications prior to admission.     A comprehensive review of systems was negative.  Last menstrual period 01/24/2019. General appearance: alert, cooperative and appears stated age Head: Normocephalic, without obvious abnormality, atraumatic Neck: supple, symmetrical, trachea midline Lungs: normal effort Heart: regular rate and rhythm Abdomen: gravid, non-tender Extremities: Homans sign is negative, no sign of DVT Skin: Skin color, texture, turgor normal. No rashes or lesions Neurologic: Grossly normal   Lab Results  Component Value Date   WBC 9.2 05/31/2019   HGB 11.6 (L) 05/31/2019   HCT 34.8 (L) 05/31/2019   MCV 80.6 05/31/2019   PLT 365 05/31/2019         ABO, Rh:    Antibody:    Rubella:    RPR:    HBsAg:    HIV:    GBS:       Assessment/Plan Patient Active Problem List   Diagnosis Date Noted  . Language barrier 10/12/2019  . Malpresentation before onset of labor 10/12/2019   For primary C-section.Risks include but are not limited to bleeding, infection, injury to surrounding structures, including bowel, bladder and ureters, blood clots, and death.  Likelihood of success is high.   Deborah Lawson 10/22/2019, 1:11 PM

## 2019-10-24 ENCOUNTER — Other Ambulatory Visit (HOSPITAL_COMMUNITY)
Admission: RE | Admit: 2019-10-24 | Discharge: 2019-10-24 | Disposition: A | Discharge status: Home / Self Care | Payer: Medicaid Other | Source: Ambulatory Visit | Attending: Obstetrics and Gynecology | Admitting: Obstetrics and Gynecology

## 2019-10-24 ENCOUNTER — Other Ambulatory Visit: Payer: Self-pay

## 2019-10-24 DIAGNOSIS — Z01812 Encounter for preprocedural laboratory examination: Secondary | ICD-10-CM | POA: Diagnosis present

## 2019-10-24 DIAGNOSIS — Z20822 Contact with and (suspected) exposure to covid-19: Secondary | ICD-10-CM | POA: Diagnosis not present

## 2019-10-24 HISTORY — DX: Other specified health status: Z78.9

## 2019-10-24 LAB — CBC
HCT: 41.1 % (ref 36.0–46.0)
Hemoglobin: 13.4 g/dL (ref 12.0–15.0)
MCH: 26.8 pg (ref 26.0–34.0)
MCHC: 32.6 g/dL (ref 30.0–36.0)
MCV: 82.2 fL (ref 80.0–100.0)
Platelets: 319 10*3/uL (ref 150–400)
RBC: 5 MIL/uL (ref 3.87–5.11)
RDW: 14.6 % (ref 11.5–15.5)
WBC: 6.9 10*3/uL (ref 4.0–10.5)
nRBC: 0 % (ref 0.0–0.2)

## 2019-10-24 LAB — TYPE AND SCREEN
ABO/RH(D): O POS
Antibody Screen: NEGATIVE

## 2019-10-24 LAB — RPR: RPR Ser Ql: NONREACTIVE

## 2019-10-24 LAB — SARS CORONAVIRUS 2 (TAT 6-24 HRS): SARS Coronavirus 2: NEGATIVE

## 2019-10-24 NOTE — Progress Notes (Addendum)
Presents for Covid-19 swab and pre-op blood work for scheduled surgery.  Reports asymptomatic. Covid swab done, tolerated well.  Pre-op  instructions and  Body cleanser given, instructions reviewed with pt and understanding verbalized.

## 2019-10-26 ENCOUNTER — Inpatient Hospital Stay (HOSPITAL_COMMUNITY): Payer: Medicaid Other | Admitting: Anesthesiology

## 2019-10-26 ENCOUNTER — Encounter (HOSPITAL_COMMUNITY)
Admission: RE | Disposition: A | Discharge status: Home / Self Care | Payer: Self-pay | Source: Home / Self Care | Attending: Family Medicine

## 2019-10-26 ENCOUNTER — Encounter (HOSPITAL_COMMUNITY): Payer: Self-pay | Admitting: Family Medicine

## 2019-10-26 ENCOUNTER — Other Ambulatory Visit: Payer: Self-pay

## 2019-10-26 ENCOUNTER — Inpatient Hospital Stay (HOSPITAL_COMMUNITY)
Admission: RE | Admit: 2019-10-26 | Discharge: 2019-10-28 | DRG: 788 | Disposition: A | Discharge status: Home / Self Care | Payer: Medicaid Other | Attending: Family Medicine | Admitting: Family Medicine

## 2019-10-26 DIAGNOSIS — O328XX0 Maternal care for other malpresentation of fetus, not applicable or unspecified: Secondary | ICD-10-CM

## 2019-10-26 DIAGNOSIS — O321XX0 Maternal care for breech presentation, not applicable or unspecified: Secondary | ICD-10-CM | POA: Diagnosis not present

## 2019-10-26 DIAGNOSIS — E669 Obesity, unspecified: Secondary | ICD-10-CM | POA: Diagnosis not present

## 2019-10-26 DIAGNOSIS — Z20822 Contact with and (suspected) exposure to covid-19: Secondary | ICD-10-CM | POA: Diagnosis present

## 2019-10-26 DIAGNOSIS — Z3A39 39 weeks gestation of pregnancy: Secondary | ICD-10-CM | POA: Diagnosis not present

## 2019-10-26 DIAGNOSIS — O329XX0 Maternal care for malpresentation of fetus, unspecified, not applicable or unspecified: Secondary | ICD-10-CM | POA: Diagnosis present

## 2019-10-26 DIAGNOSIS — O99214 Obesity complicating childbirth: Secondary | ICD-10-CM | POA: Diagnosis present

## 2019-10-26 DIAGNOSIS — Z98891 History of uterine scar from previous surgery: Secondary | ICD-10-CM

## 2019-10-26 SURGERY — Surgical Case
Anesthesia: Spinal | Wound class: Clean Contaminated

## 2019-10-26 MED ORDER — OXYTOCIN-SODIUM CHLORIDE 30-0.9 UT/500ML-% IV SOLN
2.5000 [IU]/h | INTRAVENOUS | Status: AC
  Administered 2019-10-26: 2.5 [IU]/h via INTRAVENOUS
  Filled 2019-10-26: qty 500

## 2019-10-26 MED ORDER — DIBUCAINE (PERIANAL) 1 % EX OINT: 1.0000 "application " | TOPICAL_OINTMENT | CUTANEOUS | Status: DC | PRN

## 2019-10-26 MED ORDER — GABAPENTIN 300 MG PO CAPS
ORAL_CAPSULE | ORAL | Status: AC
  Filled 2019-10-26: qty 1

## 2019-10-26 MED ORDER — NALOXONE HCL 4 MG/10ML IJ SOLN
1.0000 ug/kg/h | INTRAVENOUS | Status: DC | PRN
  Filled 2019-10-26: qty 5

## 2019-10-26 MED ORDER — IBUPROFEN 800 MG PO TABS
800.0000 mg | ORAL_TABLET | Freq: Four times a day (QID) | ORAL | Status: DC
  Administered 2019-10-27 – 2019-10-28 (×4): 800 mg via ORAL
  Filled 2019-10-26 (×4): qty 1

## 2019-10-26 MED ORDER — PHENYLEPHRINE HCL-NACL 20-0.9 MG/250ML-% IV SOLN
INTRAVENOUS | Status: DC | PRN
  Administered 2019-10-26: 60 ug/min via INTRAVENOUS

## 2019-10-26 MED ORDER — DIPHENHYDRAMINE HCL 25 MG PO CAPS: 25.0000 mg | ORAL_CAPSULE | ORAL | Status: DC | PRN

## 2019-10-26 MED ORDER — OXYCODONE HCL 5 MG/5ML PO SOLN: 5.0000 mg | Freq: Once | ORAL | Status: DC | PRN

## 2019-10-26 MED ORDER — SIMETHICONE 80 MG PO CHEW
80.0000 mg | CHEWABLE_TABLET | Freq: Three times a day (TID) | ORAL | Status: DC
  Administered 2019-10-26 – 2019-10-28 (×5): 80 mg via ORAL
  Filled 2019-10-26 (×6): qty 1

## 2019-10-26 MED ORDER — COCONUT OIL OIL: 1.0000 "application " | TOPICAL_OIL | Status: DC | PRN

## 2019-10-26 MED ORDER — BUPIVACAINE IN DEXTROSE 0.75-8.25 % IT SOLN
INTRATHECAL | Status: DC | PRN
  Administered 2019-10-26: 1.6 mL via INTRATHECAL

## 2019-10-26 MED ORDER — NALBUPHINE HCL 10 MG/ML IJ SOLN: 5.0000 mg | INTRAMUSCULAR | Status: DC | PRN

## 2019-10-26 MED ORDER — GABAPENTIN 300 MG PO CAPS
300.0000 mg | ORAL_CAPSULE | ORAL | Status: AC
  Administered 2019-10-26: 300 mg via ORAL

## 2019-10-26 MED ORDER — LACTATED RINGERS IV SOLN: INTRAVENOUS | Status: DC

## 2019-10-26 MED ORDER — FENTANYL CITRATE (PF) 100 MCG/2ML IJ SOLN
INTRAMUSCULAR | Status: DC | PRN
  Administered 2019-10-26: 15 ug via INTRATHECAL

## 2019-10-26 MED ORDER — PHENYLEPHRINE HCL-NACL 20-0.9 MG/250ML-% IV SOLN
INTRAVENOUS | Status: AC
  Filled 2019-10-26: qty 250

## 2019-10-26 MED ORDER — TERBUTALINE SULFATE 1 MG/ML IJ SOLN
INTRAMUSCULAR | Status: AC
  Filled 2019-10-26: qty 1

## 2019-10-26 MED ORDER — MORPHINE SULFATE (PF) 0.5 MG/ML IJ SOLN
INTRAMUSCULAR | Status: DC | PRN
  Administered 2019-10-26: .15 mg via INTRATHECAL

## 2019-10-26 MED ORDER — SODIUM CHLORIDE 0.9 % IR SOLN
Status: DC | PRN
  Administered 2019-10-26: 1000 mL

## 2019-10-26 MED ORDER — OXYCODONE HCL 5 MG PO TABS
5.0000 mg | ORAL_TABLET | ORAL | Status: DC | PRN
  Administered 2019-10-27 – 2019-10-28 (×2): 10 mg via ORAL
  Administered 2019-10-28: 5 mg via ORAL
  Filled 2019-10-26: qty 1
  Filled 2019-10-26 (×2): qty 2

## 2019-10-26 MED ORDER — SOD CITRATE-CITRIC ACID 500-334 MG/5ML PO SOLN
ORAL | Status: AC
  Filled 2019-10-26: qty 30

## 2019-10-26 MED ORDER — NALBUPHINE HCL 10 MG/ML IJ SOLN: 5.0000 mg | Freq: Once | INTRAMUSCULAR | Status: DC | PRN

## 2019-10-26 MED ORDER — ACETAMINOPHEN 500 MG PO TABS
1000.0000 mg | ORAL_TABLET | Freq: Three times a day (TID) | ORAL | Status: DC
  Administered 2019-10-26 – 2019-10-28 (×6): 1000 mg via ORAL
  Filled 2019-10-26 (×6): qty 2

## 2019-10-26 MED ORDER — SENNOSIDES-DOCUSATE SODIUM 8.6-50 MG PO TABS
2.0000 | ORAL_TABLET | ORAL | Status: DC
  Administered 2019-10-26 – 2019-10-28 (×2): 2 via ORAL
  Filled 2019-10-26 (×2): qty 2

## 2019-10-26 MED ORDER — ONDANSETRON HCL 4 MG/2ML IJ SOLN: 4.0000 mg | Freq: Three times a day (TID) | INTRAMUSCULAR | Status: DC | PRN

## 2019-10-26 MED ORDER — DEXAMETHASONE SODIUM PHOSPHATE 4 MG/ML IJ SOLN
INTRAMUSCULAR | Status: AC
  Filled 2019-10-26: qty 1

## 2019-10-26 MED ORDER — POVIDONE-IODINE 10 % EX SWAB: 2.0000 "application " | Freq: Once | CUTANEOUS | Status: DC

## 2019-10-26 MED ORDER — MORPHINE SULFATE (PF) 0.5 MG/ML IJ SOLN
INTRAMUSCULAR | Status: AC
  Filled 2019-10-26: qty 10

## 2019-10-26 MED ORDER — PRENATAL MULTIVITAMIN CH
1.0000 | ORAL_TABLET | Freq: Every day | ORAL | Status: DC
  Administered 2019-10-28: 1 via ORAL
  Filled 2019-10-26 (×2): qty 1

## 2019-10-26 MED ORDER — MEPERIDINE HCL 25 MG/ML IJ SOLN: 6.2500 mg | INTRAMUSCULAR | Status: DC | PRN

## 2019-10-26 MED ORDER — KETOROLAC TROMETHAMINE 30 MG/ML IJ SOLN
30.0000 mg | Freq: Four times a day (QID) | INTRAMUSCULAR | Status: AC
  Administered 2019-10-26 – 2019-10-27 (×4): 30 mg via INTRAVENOUS
  Filled 2019-10-26 (×4): qty 1

## 2019-10-26 MED ORDER — ENOXAPARIN SODIUM 40 MG/0.4ML ~~LOC~~ SOLN
40.0000 mg | SUBCUTANEOUS | Status: DC
  Administered 2019-10-27 – 2019-10-28 (×2): 40 mg via SUBCUTANEOUS
  Filled 2019-10-26 (×2): qty 0.4

## 2019-10-26 MED ORDER — PROMETHAZINE HCL 25 MG/ML IJ SOLN: 6.2500 mg | INTRAMUSCULAR | Status: DC | PRN

## 2019-10-26 MED ORDER — ACETAMINOPHEN 500 MG PO TABS
ORAL_TABLET | ORAL | Status: AC
  Filled 2019-10-26: qty 2

## 2019-10-26 MED ORDER — DEXAMETHASONE SODIUM PHOSPHATE 4 MG/ML IJ SOLN
INTRAMUSCULAR | Status: DC | PRN
Start: 2019-10-26 — End: 2019-10-26
  Administered 2019-10-26: 4 mg via INTRAVENOUS

## 2019-10-26 MED ORDER — SIMETHICONE 80 MG PO CHEW
80.0000 mg | CHEWABLE_TABLET | ORAL | Status: DC
  Administered 2019-10-26 – 2019-10-28 (×2): 80 mg via ORAL
  Filled 2019-10-26 (×2): qty 1

## 2019-10-26 MED ORDER — SCOPOLAMINE 1 MG/3DAYS TD PT72
1.0000 | MEDICATED_PATCH | Freq: Once | TRANSDERMAL | Status: DC
  Administered 2019-10-26: 1.5 mg via TRANSDERMAL

## 2019-10-26 MED ORDER — CEFAZOLIN SODIUM-DEXTROSE 2-4 GM/100ML-% IV SOLN
INTRAVENOUS | Status: AC
  Filled 2019-10-26: qty 100

## 2019-10-26 MED ORDER — ACETAMINOPHEN 500 MG PO TABS
1000.0000 mg | ORAL_TABLET | ORAL | Status: AC
  Administered 2019-10-26: 1000 mg via ORAL

## 2019-10-26 MED ORDER — OXYTOCIN-SODIUM CHLORIDE 30-0.9 UT/500ML-% IV SOLN
INTRAVENOUS | Status: DC | PRN
  Administered 2019-10-26: 500 mL via INTRAVENOUS

## 2019-10-26 MED ORDER — BUPIVACAINE HCL (PF) 0.25 % IJ SOLN
INTRAMUSCULAR | Status: AC
  Filled 2019-10-26: qty 30

## 2019-10-26 MED ORDER — NALOXONE HCL 0.4 MG/ML IJ SOLN: 0.4000 mg | INTRAMUSCULAR | Status: DC | PRN

## 2019-10-26 MED ORDER — KETOROLAC TROMETHAMINE 30 MG/ML IJ SOLN: 30.0000 mg | Freq: Four times a day (QID) | INTRAMUSCULAR | Status: AC | PRN

## 2019-10-26 MED ORDER — WITCH HAZEL-GLYCERIN EX PADS: 1.0000 "application " | MEDICATED_PAD | CUTANEOUS | Status: DC | PRN

## 2019-10-26 MED ORDER — FENTANYL CITRATE (PF) 100 MCG/2ML IJ SOLN
INTRAMUSCULAR | Status: AC
  Filled 2019-10-26: qty 2

## 2019-10-26 MED ORDER — DIPHENHYDRAMINE HCL 25 MG PO CAPS: 25.0000 mg | ORAL_CAPSULE | Freq: Four times a day (QID) | ORAL | Status: DC | PRN

## 2019-10-26 MED ORDER — FENTANYL CITRATE (PF) 100 MCG/2ML IJ SOLN: 25.0000 ug | INTRAMUSCULAR | Status: DC | PRN

## 2019-10-26 MED ORDER — SOD CITRATE-CITRIC ACID 500-334 MG/5ML PO SOLN
30.0000 mL | ORAL | Status: AC
  Administered 2019-10-26: 30 mL via ORAL

## 2019-10-26 MED ORDER — KETOROLAC TROMETHAMINE 30 MG/ML IJ SOLN
INTRAMUSCULAR | Status: AC
  Filled 2019-10-26: qty 1

## 2019-10-26 MED ORDER — STERILE WATER FOR IRRIGATION IR SOLN
Status: DC | PRN
  Administered 2019-10-26: 1000 mL

## 2019-10-26 MED ORDER — SIMETHICONE 80 MG PO CHEW: 80.0000 mg | CHEWABLE_TABLET | ORAL | Status: DC | PRN

## 2019-10-26 MED ORDER — BUPIVACAINE HCL 0.25 % IJ SOLN
INTRAMUSCULAR | Status: DC | PRN
  Administered 2019-10-26: 30 mL

## 2019-10-26 MED ORDER — CEFAZOLIN SODIUM-DEXTROSE 2-4 GM/100ML-% IV SOLN
2.0000 g | INTRAVENOUS | Status: AC
  Administered 2019-10-26: 2 g via INTRAVENOUS

## 2019-10-26 MED ORDER — SODIUM CHLORIDE 0.9% FLUSH: 3.0000 mL | INTRAVENOUS | Status: DC | PRN

## 2019-10-26 MED ORDER — KETOROLAC TROMETHAMINE 30 MG/ML IJ SOLN
30.0000 mg | Freq: Four times a day (QID) | INTRAMUSCULAR | Status: AC | PRN
  Administered 2019-10-26: 30 mg via INTRAMUSCULAR

## 2019-10-26 MED ORDER — SCOPOLAMINE 1 MG/3DAYS TD PT72
MEDICATED_PATCH | TRANSDERMAL | Status: AC
  Filled 2019-10-26: qty 1

## 2019-10-26 MED ORDER — DIPHENHYDRAMINE HCL 50 MG/ML IJ SOLN: 12.5000 mg | INTRAMUSCULAR | Status: DC | PRN

## 2019-10-26 MED ORDER — OXYTOCIN-SODIUM CHLORIDE 30-0.9 UT/500ML-% IV SOLN
INTRAVENOUS | Status: AC
  Filled 2019-10-26: qty 500

## 2019-10-26 MED ORDER — ONDANSETRON HCL 4 MG/2ML IJ SOLN
INTRAMUSCULAR | Status: AC
  Filled 2019-10-26: qty 2

## 2019-10-26 MED ORDER — OXYCODONE HCL 5 MG PO TABS: 5.0000 mg | ORAL_TABLET | Freq: Once | ORAL | Status: DC | PRN

## 2019-10-26 MED ORDER — TETANUS-DIPHTH-ACELL PERTUSSIS 5-2.5-18.5 LF-MCG/0.5 IM SUSP: 0.5000 mL | Freq: Once | INTRAMUSCULAR | Status: DC

## 2019-10-26 MED ORDER — MENTHOL 3 MG MT LOZG: 1.0000 | LOZENGE | OROMUCOSAL | Status: DC | PRN

## 2019-10-26 MED ORDER — ONDANSETRON HCL 4 MG/2ML IJ SOLN
INTRAMUSCULAR | Status: DC | PRN
  Administered 2019-10-26: 4 mg via INTRAVENOUS

## 2019-10-26 SURGICAL SUPPLY — 34 items
BENZOIN TINCTURE PRP APPL 2/3 (GAUZE/BANDAGES/DRESSINGS) ×2 IMPLANT
CLAMP CORD UMBIL (MISCELLANEOUS) ×2 IMPLANT
CLOSURE STERI STRIP 1/2 X4 (GAUZE/BANDAGES/DRESSINGS) ×2 IMPLANT
CLOTH BEACON ORANGE TIMEOUT ST (SAFETY) ×2 IMPLANT
DRSG OPSITE POSTOP 4X10 (GAUZE/BANDAGES/DRESSINGS) ×2 IMPLANT
ELECT REM PT RETURN 9FT ADLT (ELECTROSURGICAL) ×2
ELECTRODE REM PT RTRN 9FT ADLT (ELECTROSURGICAL) ×1 IMPLANT
EXTRACTOR VACUUM M CUP 4 TUBE (SUCTIONS) IMPLANT
GLOVE BIOGEL PI IND STRL 7.0 (GLOVE) ×3 IMPLANT
GLOVE BIOGEL PI INDICATOR 7.0 (GLOVE) ×3
GLOVE ECLIPSE 7.0 STRL STRAW (GLOVE) ×2 IMPLANT
GOWN STRL REUS W/TWL LRG LVL3 (GOWN DISPOSABLE) ×4 IMPLANT
HEMOSTAT ARISTA ABSORB 3G PWDR (HEMOSTASIS) ×2 IMPLANT
KIT ABG SYR 3ML LUER SLIP (SYRINGE) ×2 IMPLANT
NEEDLE HYPO 22GX1.5 SAFETY (NEEDLE) ×2 IMPLANT
NEEDLE HYPO 25X5/8 SAFETYGLIDE (NEEDLE) ×2 IMPLANT
NS IRRIG 1000ML POUR BTL (IV SOLUTION) ×2 IMPLANT
PACK C SECTION WH (CUSTOM PROCEDURE TRAY) ×2 IMPLANT
PAD ABD 7.5X8 STRL (GAUZE/BANDAGES/DRESSINGS) ×2 IMPLANT
PAD OB MATERNITY 4.3X12.25 (PERSONAL CARE ITEMS) ×2 IMPLANT
PENCIL SMOKE EVAC W/HOLSTER (ELECTROSURGICAL) ×2 IMPLANT
RTRCTR C-SECT PINK 25CM LRG (MISCELLANEOUS) ×2 IMPLANT
SPONGE GAUZE 4X4 12PLY STER LF (GAUZE/BANDAGES/DRESSINGS) ×4 IMPLANT
STRIP CLOSURE SKIN 1/2X4 (GAUZE/BANDAGES/DRESSINGS) ×2 IMPLANT
SUT MNCRL 0 VIOLET CTX 36 (SUTURE) ×3 IMPLANT
SUT MONOCRYL 0 CTX 36 (SUTURE) ×3
SUT PLAIN 2 0 XLH (SUTURE) ×2 IMPLANT
SUT VIC AB 0 CTX 36 (SUTURE) ×1
SUT VIC AB 0 CTX36XBRD ANBCTRL (SUTURE) ×1 IMPLANT
SUT VIC AB 4-0 KS 27 (SUTURE) ×2 IMPLANT
SYR 30ML LL (SYRINGE) ×2 IMPLANT
TOWEL OR 17X24 6PK STRL BLUE (TOWEL DISPOSABLE) ×2 IMPLANT
TRAY FOLEY W/BAG SLVR 14FR LF (SET/KITS/TRAYS/PACK) ×2 IMPLANT
WATER STERILE IRR 1000ML POUR (IV SOLUTION) ×2 IMPLANT

## 2019-10-26 NOTE — Transfer of Care (Signed)
Immediate Anesthesia Transfer of Care Note  Patient: Deborah Lawson  Procedure(s) Performed: CESAREAN SECTION (N/A )  Patient Location: PACU  Anesthesia Type:Spinal  Level of Consciousness: awake  Airway & Oxygen Therapy: Patient Spontanous Breathing  Post-op Assessment: Report given to RN and Post -op Vital signs reviewed and stable  Post vital signs: Reviewed and stable  Last Vitals:  Vitals Value Taken Time  BP 126/75 10/26/19 1103  Temp    Pulse 96 10/26/19 1105  Resp 21 10/26/19 1105  SpO2 100 % 10/26/19 1105  Vitals shown include unvalidated device data.  Last Pain:  Vitals:   10/26/19 0844  TempSrc: Oral  PainSc:          Complications: No complications documented.

## 2019-10-26 NOTE — Discharge Summary (Signed)
   Postpartum Discharge Summary  Date of Service updated 10/28/19     Patient Name: Deborah Lawson DOB: 02/08/1991 MRN: [id number]  Date of admission: 10/26/2019 Delivery date:10/26/2019  Delivering provider: PRATT, TANYA S  Date of discharge: 10/28/2019  Admitting diagnosis: Breech presentation of fetus [O32.1XX0] Intrauterine pregnancy: [redacted]w[redacted]d     Secondary diagnosis:  Principal Problem:   Malpresentation before onset of labor Active Problems:   Breech presentation of fetus  Additional problems: None    Discharge diagnosis: Term Pregnancy Delivered                                              Post partum procedures:none Augmentation: N/A Complications: None  Hospital course: Sceduled C/S   29 y.o. yo G3P3003 at [redacted]w[redacted]d was admitted to the hospital 10/26/2019 for scheduled cesarean section with the following indication:Malpresentation.Delivery details are as follows:  Membrane Rupture Time/Date: 9:51 AM ,10/26/2019   Delivery Method:C-Section, Low Transverse  Details of operation can be found in separate operative note.  Patient had an uncomplicated postpartum course.  She is ambulating, tolerating a regular diet, passing flatus, and urinating well. Patient is discharged home in stable condition on  10/28/19        Newborn Data: Birth date:10/26/2019  Birth time:9:51 AM  Gender:Female  Living status:Living  Apgars:8 ,8  Weight:3147 g     Magnesium Sulfate received: No BMZ received: No Rhophylac:No MMR:No T-DaP:Given prenatally Flu: N/A Transfusion:No  Physical exam  Vitals:   10/27/19 0830 10/27/19 1445 10/27/19 2059 10/28/19 0550  BP: 126/83 124/73 130/88 127/88  Pulse:  79 93 84  Resp: 18 16 16 16  Temp: 97.8 F (36.6 C) 97.6 F (36.4 C) 98 F (36.7 C) 97.6 F (36.4 C)  TempSrc: Oral Oral Oral Oral  SpO2: 98% 99% 100%   Weight:      Height:       General: alert, cooperative and no distress Lochia: appropriate Uterine Fundus: firm Incision: Dressing is clean,  intact, with small area of bleeding marked DVT Evaluation: No evidence of DVT seen on physical exam. Negative Homan's sign. No cords or calf tenderness. No significant calf/ankle edema. Labs: Lab Results  Component Value Date   WBC 11.5 (H) 10/27/2019   HGB 11.0 (L) 10/27/2019   HCT 33.1 (L) 10/27/2019   MCV 83.8 10/27/2019   PLT 250 10/27/2019   CMP Latest Ref Rng & Units 10/27/2019  Creatinine 0.44 - 1.00 mg/dL 0.44   Edinburgh Score: Edinburgh Postnatal Depression Scale Screening Tool 10/27/2019  I have been able to laugh and see the funny side of things. 0  I have looked forward with enjoyment to things. 0  I have blamed myself unnecessarily when things went wrong. 0  I have been anxious or worried for no good reason. 0  I have felt scared or panicky for no good reason. 0  Things have been getting on top of me. 0  I have been so unhappy that I have had difficulty sleeping. 0  I have felt sad or miserable. 0  I have been so unhappy that I have been crying. 0  The thought of harming myself has occurred to me. 0  Edinburgh Postnatal Depression Scale Total 0     After visit meds:  Allergies as of 10/28/2019   No Known Allergies     Medication List      TAKE these medications   acetaminophen 325 MG tablet Commonly known as: TYLENOL Take 650 mg by mouth every 6 (six) hours as needed for moderate pain or headache.   bacitracin ointment Apply 1 application topically 2 (two) times daily. Apply to burns 2x daily for 5 days.   calcium carbonate 500 MG chewable tablet Commonly known as: TUMS - dosed in mg elemental calcium Chew 1 tablet by mouth 3 (three) times daily as needed for indigestion or heartburn.   docusate sodium 100 MG capsule Commonly known as: Colace Take 1 capsule (100 mg total) by mouth daily as needed.   ibuprofen 800 MG tablet Commonly known as: ADVIL Take 1 tablet (800 mg total) by mouth every 6 (six) hours.   oxyCODONE-acetaminophen 5-325 MG  tablet Commonly known as: Percocet Take 1 tablet by mouth every 4 (four) hours as needed for severe pain.   Prenatal 27-1 MG Tabs Take 1 tablet by mouth daily.        Discharge home in stable condition Infant Feeding: Breast Infant Disposition:home with mother Discharge instruction: per After Visit Summary and Postpartum booklet. Activity: Advance as tolerated. Pelvic rest for 6 weeks.  Diet: routine diet Future Appointments: Future Appointments  Date Time Provider [place]  11/05/2019 10:20 AM [facility] NURSE [facility]   Follow up Visit:  Follow-up Information    Department, [facility] Follow up.   Why: IN 4-6 weeks for postpartum visit.  Contact information: [address] [place] [id number] [phone]                Please schedule this patient for a In person postpartum visit in 1 week with the following provider: RN. Additional Postpartum F/U:Incision check 1 week  Low risk pregnancy complicated by: n/a Delivery mode:  C-Section, Low Transverse  Anticipated Birth Control:  POPs   10/28/2019 Fatima Blank, CNM

## 2019-10-26 NOTE — Progress Notes (Signed)
Using the video remote interpreter #[id number], Pt was admitted to room 418. Pt oriented to room and Plan of care. Pt verbalized understanding. Admission paper work was completed. Pts questions were answered.

## 2019-10-26 NOTE — Interval H&P Note (Signed)
History and Physical Interval Note:  10/26/2019 11:02 AM  Deborah Lawson  has presented today for surgery, with the diagnosis of Breech.  The various methods of treatment have been discussed with the patient and family. After consideration of risks, benefits and other options for treatment, the patient has consented to  Procedure(s): CESAREAN SECTION (N/A) as a surgical intervention.  The patient's history has been reviewed, patient examined, no change in status, stable for surgery.  I have reviewed the patient's chart and labs.  Questions were answered to the patient's satisfaction.     Reva Bores

## 2019-10-26 NOTE — Anesthesia Procedure Notes (Signed)
Spinal  Patient location during procedure: OR Start time: 10/26/2019 9:25 AM End time: 10/26/2019 9:29 AM Staffing Performed: anesthesiologist  Anesthesiologist: [name], MD Preanesthetic Checklist Completed: patient identified, IV checked, risks and benefits discussed, surgical consent, monitors and equipment checked, pre-op evaluation and timeout performed Spinal Block Patient position: sitting Prep: DuraPrep Patient monitoring: heart rate, cardiac monitor, continuous pulse ox and blood pressure Approach: midline Location: L3-4 Injection technique: single-shot Needle Needle type: Pencan  Needle gauge: 24 G Additional Notes Consent was obtained prior to the procedure with all questions answered and concerns addressed. Risks including, but not limited to, bleeding, infection, nerve damage, paralysis, failed block, inadequate analgesia, allergic reaction, high spinal, itching, and headache were discussed and the patient wished to proceed. Functioning IV was confirmed and monitors were applied. Sterile prep and drape, including hand hygiene, mask, and sterile gloves were used. The patient was positioned and the spine was prepped. The skin was anesthetized with lidocaine. Free flow of clear CSF was obtained prior to injecting local anesthetic into the CSF. The spinal needle aspirated freely following injection. The needle was carefully withdrawn. The patient tolerated the procedure well.   Leslye Peer, MD

## 2019-10-26 NOTE — Anesthesia Preprocedure Evaluation (Addendum)
Anesthesia Evaluation  Patient identified by MRN, date of birth, ID band Patient awake    Reviewed: Allergy & Precautions, NPO status , Patient's Chart, lab work & pertinent test results  History of Anesthesia Complications Negative for: history of anesthetic complications  Airway Mallampati: II  TM Distance: >3 FB Neck ROM: Full    Dental  (+) Dental Advisory Given   Pulmonary neg pulmonary ROS,    Pulmonary exam normal        Cardiovascular negative cardio ROS Normal cardiovascular exam     Neuro/Psych negative neurological ROS  negative psych ROS   GI/Hepatic negative GI ROS, Neg liver ROS,   Endo/Other   Obesity   Renal/GU negative Renal ROS     Musculoskeletal negative musculoskeletal ROS (+)   Abdominal   Peds  Hematology negative hematology ROS (+)  Plt 319k    Anesthesia Other Findings Covid test negative   Reproductive/Obstetrics (+) Pregnancy                            Anesthesia Physical Anesthesia Plan  ASA: II  Anesthesia Plan: Spinal   Post-op Pain Management:    Induction:   PONV Risk Score and Plan: 2 and Treatment may vary due to age or medical condition, Ondansetron and Scopolamine patch - Pre-op  Airway Management Planned: Natural Airway  Additional Equipment: None  Intra-op Plan:   Post-operative Plan:   Informed Consent: I have reviewed the patients History and Physical, chart, labs and discussed the procedure including the risks, benefits and alternatives for the proposed anesthesia with the patient or authorized representative who has indicated his/her understanding and acceptance.     Interpreter used for [employer] Discussed with: CRNA and Anesthesiologist  Anesthesia Plan Comments: (Labs reviewed, platelets acceptable. Discussed risks and benefits of spinal, including spinal/epidural hematoma, infection, failed block, and PDPH.  Patient expressed understanding and wished to proceed. )       Anesthesia Quick Evaluation

## 2019-10-26 NOTE — Anesthesia Postprocedure Evaluation (Signed)
Anesthesia Post Note  Patient: Deborah Lawson  Procedure(s) Performed: CESAREAN SECTION (N/A )     Patient location during evaluation: PACU Anesthesia Type: Spinal Level of consciousness: awake and alert Pain management: pain level controlled Vital Signs Assessment: post-procedure vital signs reviewed and stable Respiratory status: spontaneous breathing and respiratory function stable Cardiovascular status: blood pressure returned to baseline and stable Postop Assessment: spinal receding and no apparent nausea or vomiting Anesthetic complications: no   No complications documented.  Last Vitals:  Vitals:   10/26/19 1200 10/26/19 1210  BP: 125/79 134/86  Pulse: 85 83  Resp: (!) 24 20  Temp: 37.1 C   SpO2: 100% 100%    Last Pain:  Vitals:   10/26/19 1200  TempSrc: Oral  PainSc: 0-No pain   Pain Goal:                   [name]

## 2019-10-26 NOTE — Op Note (Signed)
Preoperative Diagnosis:  IUP @ [redacted]w[redacted]d,   Postoperative Diagnosis:  Same  Procedure: Priamry low transverse cesarean section  Surgeon: Tinnie Gens, M.D.  Assistant: Casper Harrison, MD   Findings: Viable female infant, APGAR (1 MIN): 8   APGAR (5 MINS): 8   Weight Pending    Estimated blood loss: 1000 cc  Complications: None known  Specimens: Placenta to labor and delivery  Reason for procedure: Briefly, the patient is a 29 y.o. [id number] [redacted]w[redacted]d who presented for primary LTCS for breech presentation.   Procedure: Patient is a to the OR where spinal analgesia was administered. She was then placed in a supine position with left lateral tilt. She received 2g of Ancef and SCDs were in place. A timeout was performed. She was prepped and draped in the usual sterile fashion. A Foley catheter was placed in the bladder. A knife was then used to make a Pfannenstiel incision. This incision was carried out to underlying fascia which was divided in the midline with the knife. The incision was extended laterally, sharply.  The rectus was divided in the midline.  The peritoneal cavity was entered bluntly.  Alexis retractor was placed inside the incision.  A knife was used to make a low transverse incision on the uterus. This incision was carried down to the amniotic cavity was entered. Fetus was in breech position and was brought up out of the incision without difficulty. Cord was clamped x 2 and cut. Infant taken to waiting nurse.  Cord blood was obtained. Placenta was delivered from the uterus.  Uterus was cleaned with dry lap pads. Uterine incision closed with 0 Monocryl suture in a locked running fashion. A second layer of 0 Monocryl in an imbricating fashion was used to achieve hemostasis. Brisk bleeding was noted from a uterine vessel, and hemostasis was achieved with additional 0 Monocryl suture and overlying Arista. Alexis retractor was removed from the abdomen. Peritoneal closure was done with 0 Vicryl  suture.  Fascia is closed with 0 Vicryl suture in a running fashion. Subcutaneous tissue infused with 30cc 0.25% Marcaine.  Subcutaneous closure was performed with 0 plain suture.  Skin closed using 3-0 Vicryl on a Keith needle.  Steri strips applied, followed by pressure dressing.  All instrument, needle and lap counts were correct x 2.  Patient was awake and taken to PACU stable.  Infant remained with mom in couplet care , stable.   Gita Kudo MD 10/26/2019 11:01 AM OB Family Medicine Fellow, [facility] for [employer], [facility]

## 2019-10-27 LAB — CBC
HCT: 33.1 % — ABNORMAL LOW (ref 36.0–46.0)
Hemoglobin: 11 g/dL — ABNORMAL LOW (ref 12.0–15.0)
MCH: 27.8 pg (ref 26.0–34.0)
MCHC: 33.2 g/dL (ref 30.0–36.0)
MCV: 83.8 fL (ref 80.0–100.0)
Platelets: 250 10*3/uL (ref 150–400)
RBC: 3.95 MIL/uL (ref 3.87–5.11)
RDW: 14.5 % (ref 11.5–15.5)
WBC: 11.5 10*3/uL — ABNORMAL HIGH (ref 4.0–10.5)
nRBC: 0 % (ref 0.0–0.2)

## 2019-10-27 LAB — CREATININE, SERUM
Creatinine, Ser: 0.44 mg/dL (ref 0.44–1.00)
GFR calc Af Amer: >60 mL/min (ref >60)
GFR calc non Af Amer: >60 mL/min (ref >60)

## 2019-10-27 NOTE — Progress Notes (Signed)
POSTPARTUM PROGRESS NOTE  Subjective: Deborah Lawson is a 29 y.o. [id number] s/p LTCS secondary to breech presentation at [redacted]w[redacted]d.  She reports she is doing well. No acute events overnight. She denies any problems with ambulating, voiding or po intake. Denies nausea or vomiting. She is urinating without difficulty and has passed flatus. Pain is well controlled.  Lochia is mild.  Objective: Blood pressure 126/83, pulse 78, temperature 97.8 F (36.6 C), temperature source Oral, resp. rate 18, height 5\' 8"  (1.727 m), weight 93 kg, last menstrual period 01/24/2019, SpO2 98 %, unknown if currently breastfeeding.  Physical Exam:  General: alert, cooperative and no distress Chest: no respiratory distress Abdomen: soft, non-tender, dressing clean/dry/intact Uterine Fundus: firm and at level of umbilicus Extremities: No calf swelling or tenderness  no LE edema  Recent Labs    10/27/19 0421  HGB 11.0*  HCT 33.1*    Assessment/Plan: Deborah Lawson is a 29 y.o. 34 s/p LTCS at [redacted]w[redacted]d for breech presentation. Overall doing well. No concerns reported today.  Routine Postpartum Care: Doing well, pain well-controlled.  -- Continue routine care, lactation support  -- Contraception: POPs >OCPs -- Feeding: breast  Dispo: Plan for discharge POD#2-3.  [redacted]w[redacted]d, MD OB Fellow, [facility] 10/27/2019 12:48 PM

## 2019-10-27 NOTE — Lactation Note (Signed)
This note was copied from a baby's chart. Lactation Consultation Note  Patient Name: Girl Rim Hausner WNUUV'O Date: 10/27/2019 Reason for consult: Follow-up assessment   Video interpreter used for Arabic [phone] Dois Davenport.  P3, Baby 24 hours old.  Mother is breastfeeding and formula feeding. Reviewed volume guidelines and hand pump use. Nipples evert and compressible.  Feed on demand with cues.  Goal 8-12+ times per day after first 24 hrs.  Place baby STS if not cueing.  Discussed cluster feeding.  Encouraged breastfeeding before offering formula.   Reviewed engorgement care and monitoring voids/stools. Mom was provided with lactation brochure of services and phone number for questions.   Maternal Data Has patient been taught Hand Expression?: Yes Does the patient have breastfeeding experience prior to this delivery?: Yes  Feeding Feeding Type: Breast Fed  LATCH Score                   Interventions Interventions: Breast feeding basics reviewed;Hand pump  Lactation Tools Discussed/Used     Consult Status Consult Status: Complete    Hardie Pulley 10/27/2019, 9:57 AM

## 2019-10-28 MED ORDER — DOCUSATE SODIUM 100 MG PO CAPS
100.0000 mg | ORAL_CAPSULE | Freq: Every day | ORAL | 2 refills | Status: DC | PRN
Start: 2019-10-28 — End: 2020-02-18

## 2019-10-28 MED ORDER — IBUPROFEN 800 MG PO TABS: 800.0000 mg | ORAL_TABLET | Freq: Four times a day (QID) | ORAL | 0 refills | Status: DC

## 2019-10-28 MED ORDER — OXYCODONE-ACETAMINOPHEN 5-325 MG PO TABS: 1.0000 | ORAL_TABLET | ORAL | 0 refills | Status: DC | PRN

## 2019-11-04 ENCOUNTER — Inpatient Hospital Stay (HOSPITAL_COMMUNITY)
Admission: AD | Admit: 2019-11-04 | Discharge: 2019-11-05 | Disposition: A | Discharge status: Home / Self Care | Payer: Medicaid Other | Attending: Obstetrics and Gynecology | Admitting: Obstetrics and Gynecology

## 2019-11-04 ENCOUNTER — Inpatient Hospital Stay (HOSPITAL_COMMUNITY): Payer: Medicaid Other

## 2019-11-04 ENCOUNTER — Encounter (HOSPITAL_COMMUNITY): Payer: Self-pay | Admitting: Obstetrics and Gynecology

## 2019-11-04 ENCOUNTER — Other Ambulatory Visit: Payer: Self-pay

## 2019-11-04 DIAGNOSIS — Z98891 History of uterine scar from previous surgery: Secondary | ICD-10-CM

## 2019-11-04 DIAGNOSIS — K8020 Calculus of gallbladder without cholecystitis without obstruction: Secondary | ICD-10-CM | POA: Insufficient documentation

## 2019-11-04 DIAGNOSIS — R109 Unspecified abdominal pain: Secondary | ICD-10-CM | POA: Diagnosis present

## 2019-11-04 DIAGNOSIS — N83201 Unspecified ovarian cyst, right side: Secondary | ICD-10-CM | POA: Diagnosis not present

## 2019-11-04 DIAGNOSIS — O165 Unspecified maternal hypertension, complicating the puerperium: Secondary | ICD-10-CM | POA: Insufficient documentation

## 2019-11-04 DIAGNOSIS — O99893 Other specified diseases and conditions complicating puerperium: Secondary | ICD-10-CM | POA: Insufficient documentation

## 2019-11-04 DIAGNOSIS — O26893 Other specified pregnancy related conditions, third trimester: Secondary | ICD-10-CM | POA: Diagnosis not present

## 2019-11-04 LAB — COMPREHENSIVE METABOLIC PANEL
ALT: 25 U/L (ref 0–44)
AST: 20 U/L (ref 15–41)
Albumin: 2.7 g/dL — ABNORMAL LOW (ref 3.5–5.0)
Alkaline Phosphatase: 98 U/L (ref 38–126)
Anion gap: 8 (ref 5–15)
BUN: 9 mg/dL (ref 6–20)
CO2: 24 mmol/L (ref 22–32)
Calcium: 8.8 mg/dL — ABNORMAL LOW (ref 8.9–10.3)
Chloride: 105 mmol/L (ref 98–111)
Creatinine, Ser: 0.52 mg/dL (ref 0.44–1.00)
GFR calc Af Amer: >60 mL/min (ref >60)
GFR calc non Af Amer: >60 mL/min (ref >60)
Glucose, Bld: 104 mg/dL — ABNORMAL HIGH (ref 70–99)
Potassium: 3.8 mmol/L (ref 3.5–5.1)
Sodium: 137 mmol/L (ref 135–145)
Total Bilirubin: 0.2 mg/dL — ABNORMAL LOW (ref 0.3–1.2)
Total Protein: 6.8 g/dL (ref 6.5–8.1)

## 2019-11-04 LAB — CBC WITH DIFFERENTIAL/PLATELET
Abs Immature Granulocytes: 0.03 10*3/uL (ref 0.00–0.07)
Basophils Absolute: 0 10*3/uL (ref 0.0–0.1)
Basophils Relative: 0 %
Eosinophils Absolute: 0.2 10*3/uL (ref 0.0–0.5)
Eosinophils Relative: 2 %
HCT: 37.4 % (ref 36.0–46.0)
Hemoglobin: 11.9 g/dL — ABNORMAL LOW (ref 12.0–15.0)
Immature Granulocytes: 0 %
Lymphocytes Relative: 32 %
Lymphs Abs: 3.2 10*3/uL (ref 0.7–4.0)
MCH: 26.9 pg (ref 26.0–34.0)
MCHC: 31.8 g/dL (ref 30.0–36.0)
MCV: 84.6 fL (ref 80.0–100.0)
Monocytes Absolute: 0.8 10*3/uL (ref 0.1–1.0)
Monocytes Relative: 8 %
Neutro Abs: 5.8 10*3/uL (ref 1.7–7.7)
Neutrophils Relative %: 58 %
Platelets: 582 10*3/uL — ABNORMAL HIGH (ref 150–400)
RBC: 4.42 MIL/uL (ref 3.87–5.11)
RDW: 14.5 % (ref 11.5–15.5)
WBC: 10 10*3/uL (ref 4.0–10.5)
nRBC: 0 % (ref 0.0–0.2)

## 2019-11-04 LAB — URINALYSIS, ROUTINE W REFLEX MICROSCOPIC
Bacteria, UA: NONE SEEN
Bilirubin Urine: NEGATIVE
Glucose, UA: NEGATIVE mg/dL
Ketones, ur: NEGATIVE mg/dL
Nitrite: NEGATIVE
Protein, ur: NEGATIVE mg/dL
RBC / HPF: >50 RBC/hpf — ABNORMAL HIGH (ref 0–5)
Specific Gravity, Urine: 1.017 (ref 1.005–1.030)
pH: 5 (ref 5.0–8.0)

## 2019-11-04 IMAGING — CT CT ABD-PELV W/ CM
2 of 8 series · 14 of 46 positions shown, 18 images · IV contrast (Omni 300)
Comparison: None.

CLINICAL DATA: 29-year-old female with right lower quadrant
abdominal pain.

EXAM:
CT ABDOMEN AND PELVIS WITH CONTRAST
TECHNIQUE: Multidetector CT imaging of the abdomen and pelvis was performed
using the standard protocol following bolus administration of
intravenous contrast.
CONTRAST:  100mL OMNIPAQUE IOHEXOL 300 MG/ML  SOLN

[Series 4: a/p w/ 5mm · axial · 0.93mm/px · z∈[+804,+1174]mm · 11 of 89 slices shown, 15 images]
[im 10/89  soft-tissue]
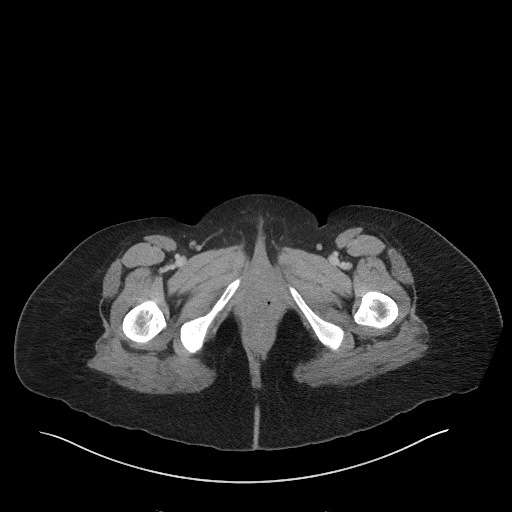
[im 10/89  bone]
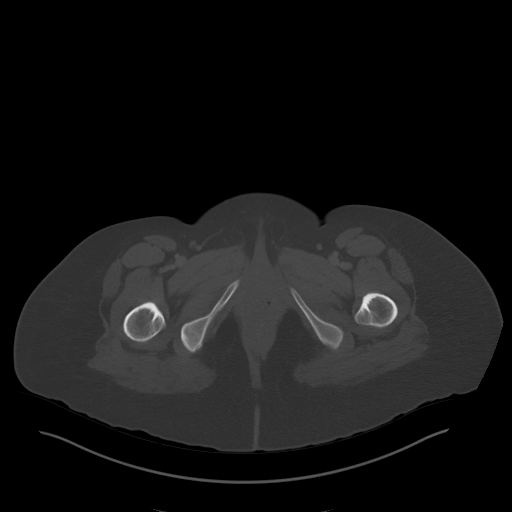
[im 19/89  soft-tissue]
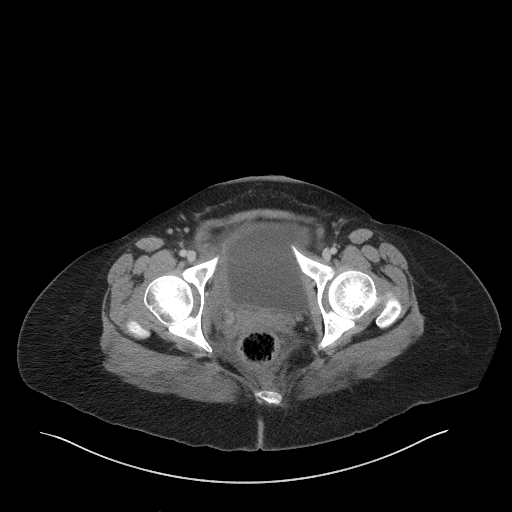
[im 28/89  soft-tissue]
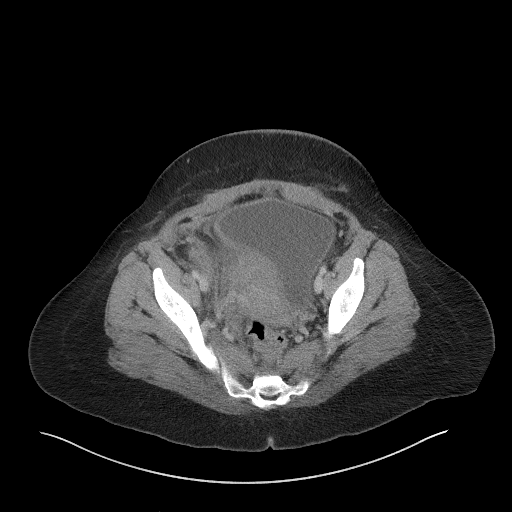
[im 38/89  soft-tissue]
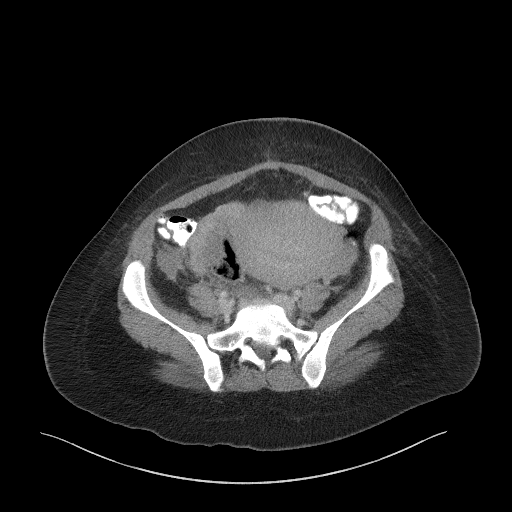
[im 47/89  soft-tissue]
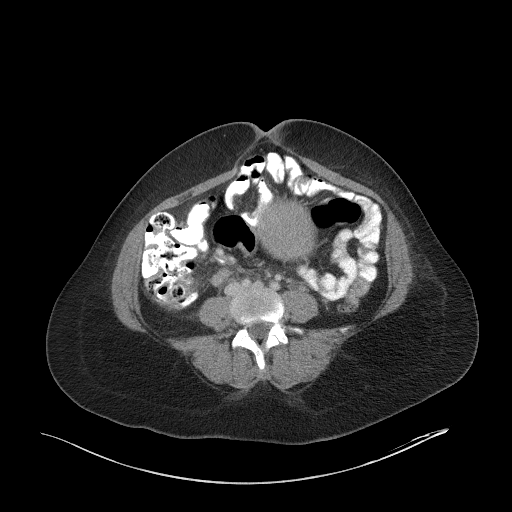
[im 56/89  soft-tissue]
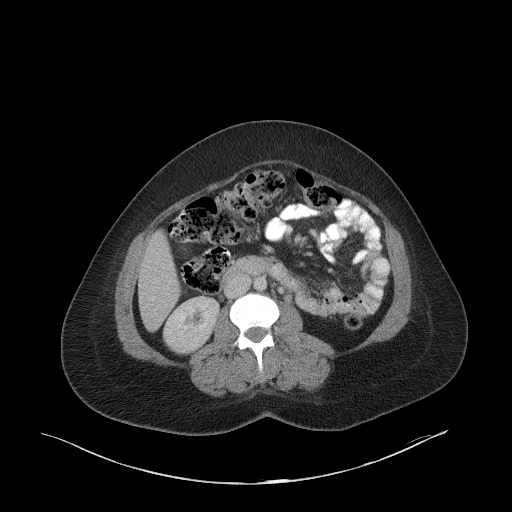
[im 65/89  soft-tissue]
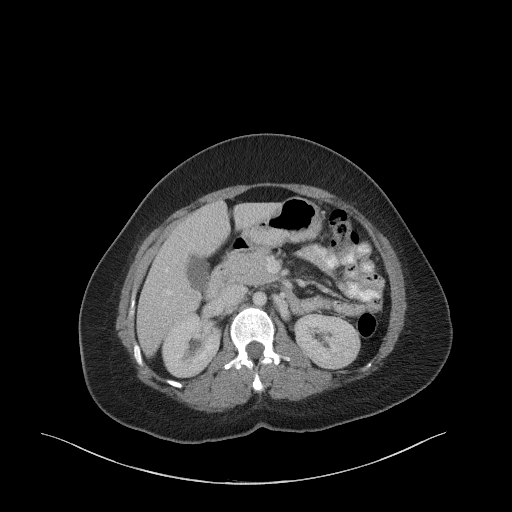
[im 70/89  lung]
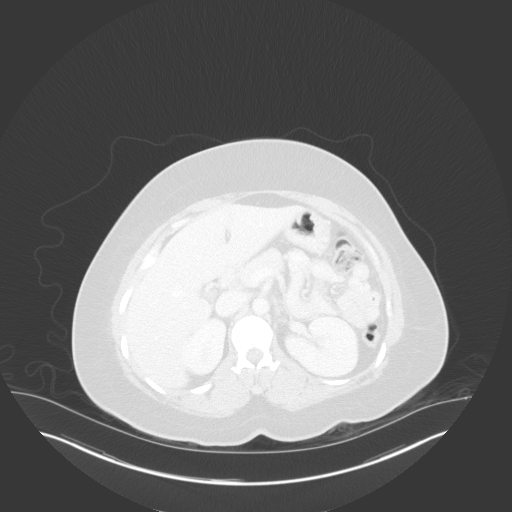
[im 75/89  soft-tissue]
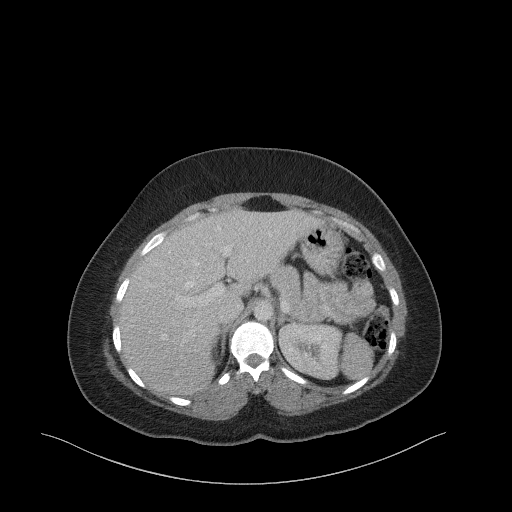
[im 75/89  lung]
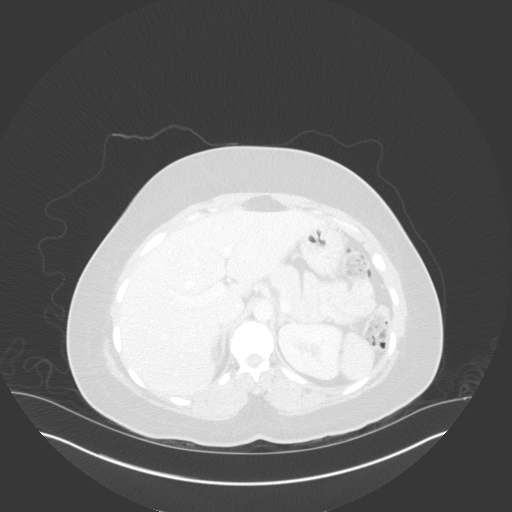
[im 79/89  lung]
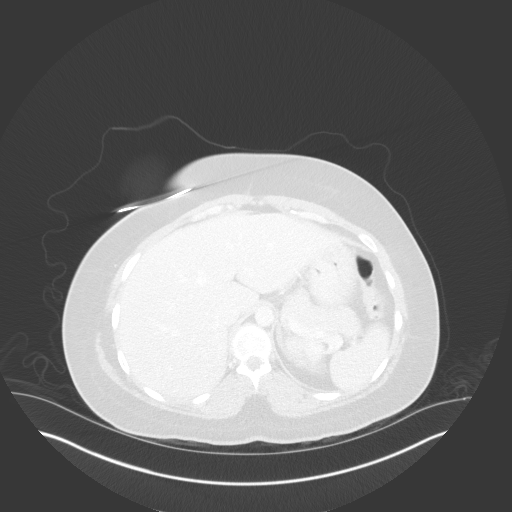
[im 84/89  soft-tissue]
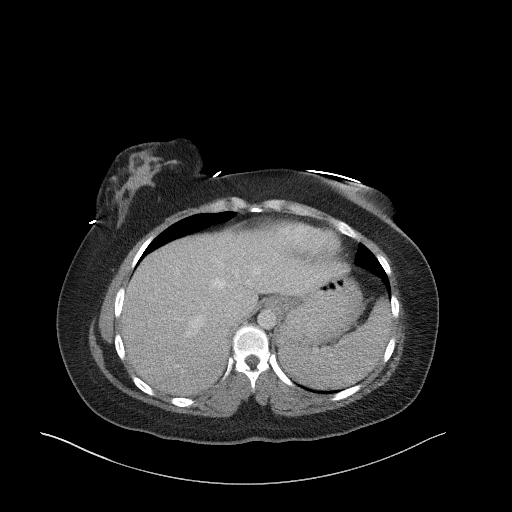
[im 84/89  lung]
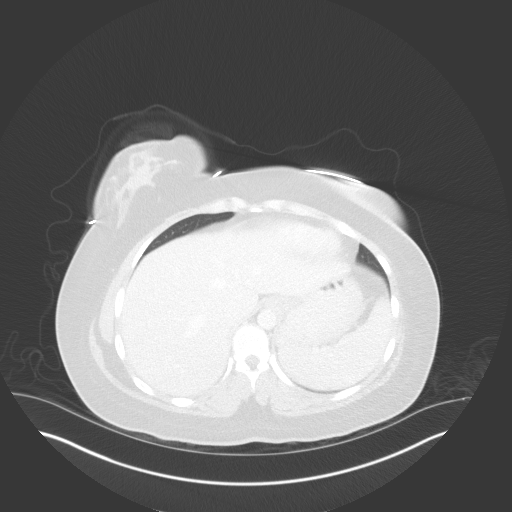
[im 84/89  bone]
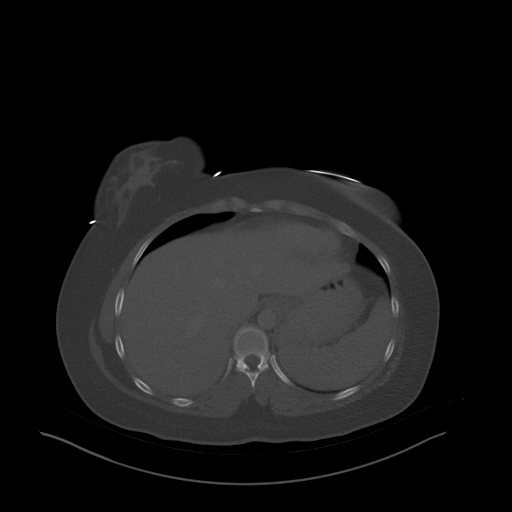

[Series 7: a/p w/ cor · coronal · 0.77mm/px · 3 of 140 slices shown]
[im 35/140  soft-tissue]
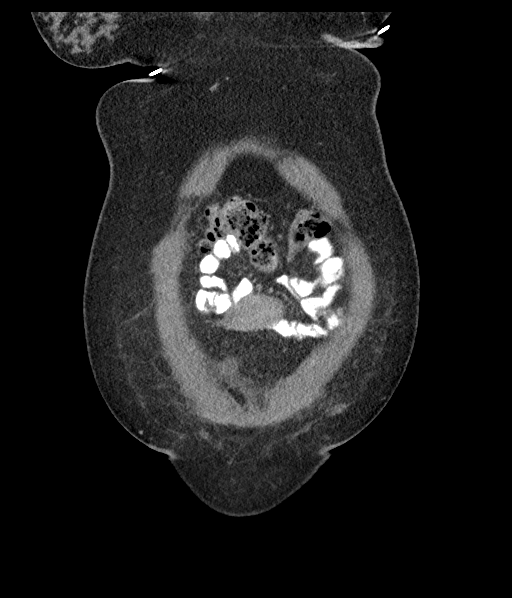
[im 70/140  soft-tissue]
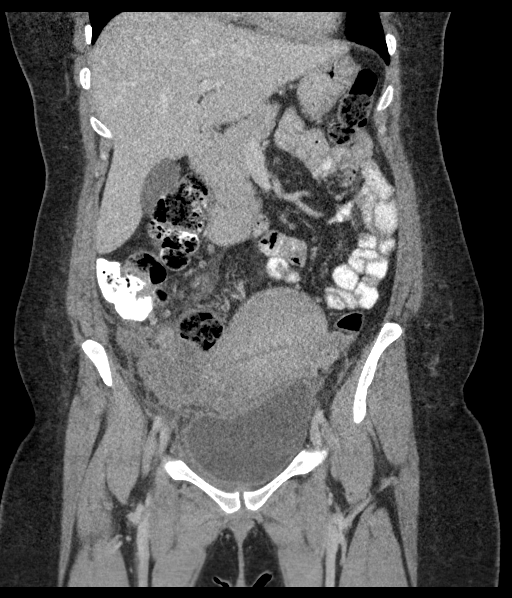
[im 105/140  soft-tissue]
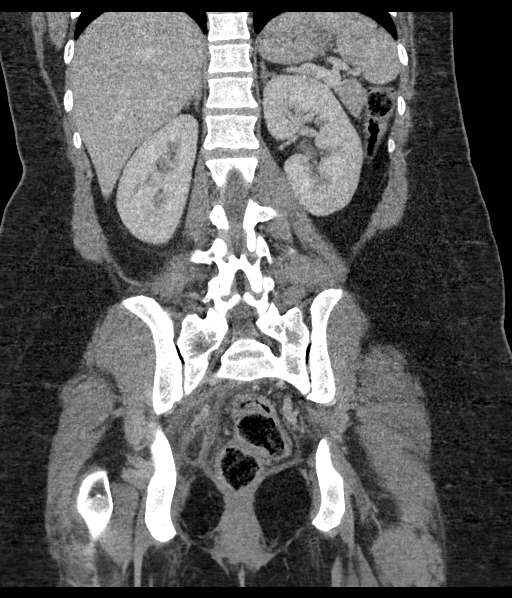

[14 of 46 positions shown; findings below may reference images not displayed]

FINDINGS: Lower chest: The visualized lung bases are clear.

No intra-abdominal free air. There is a small free fluid in the
pelvis.

Hepatobiliary: The liver is unremarkable. No intrahepatic biliary
dilatation. Gallstone. No pericholecystic fluid or evidence of acute
cholecystitis by CT.

Pancreas: Unremarkable. No pancreatic ductal dilatation or
surrounding inflammatory changes.

Spleen: No acute findings.

Adrenals/Urinary Tract: The adrenal glands unremarkable. Mild
fullness of the renal collecting systems bilaterally. There is
symmetric enhancement and excretion of contrast by both kidneys. The
urinary bladder is distended.

Stomach/Bowel: There is no bowel obstruction or active inflammation.
There is moderate stool throughout the colon. The appendix is
unremarkable.

Vascular/Lymphatic: The abdominal aorta and IVC are unremarkable. No
portal venous gas. There is no adenopathy.

Reproductive: The uterus is enlarged. The uterus is anteverted.
Focal area of irregularity in the anterior lower uterus likely
related to prior C-section. There is an ill-defined collection in
the right adnexa measuring 5.5 x 4.0 cm most consistent with a
ruptured hemorrhagic cyst. Further evaluation with pelvic ultrasound
recommended. There is associated mass effect on the bladder dome.
The left ovary is unremarkable.

Other: There is diastasis of anterior abdominal wall musculature the
midline.

Musculoskeletal: No acute or significant osseous findings.
IMPRESSION: 1. Findings most consistent with a ruptured hemorrhagic cyst in the
right adnexa. Further evaluation with pelvic ultrasound recommended.
2. No bowel obstruction. Normal appendix.
3. Cholelithiasis.

## 2019-11-04 MED ORDER — IOHEXOL 9 MG/ML PO SOLN
ORAL | Status: AC
  Filled 2019-11-04: qty 1000

## 2019-11-04 MED ORDER — IBUPROFEN 800 MG PO TABS
800.0000 mg | ORAL_TABLET | Freq: Once | ORAL | Status: AC
  Administered 2019-11-04: 800 mg via ORAL
  Filled 2019-11-04: qty 1

## 2019-11-04 MED ORDER — IOHEXOL 300 MG/ML  SOLN
100.0000 mL | Freq: Once | INTRAMUSCULAR | Status: AC | PRN
  Administered 2019-11-05: 100 mL via INTRAVENOUS

## 2019-11-04 MED ORDER — IOHEXOL 9 MG/ML PO SOLN: 500.0000 mL | ORAL | Status: AC

## 2019-11-04 NOTE — MAU Provider Note (Addendum)
Chief Complaint: Abdominal Pain   First Provider Initiated Contact with Patient 11/04/19 1825     *Video Arabic interpreter used for this encounter*  SUBJECTIVE HPI: Deborah Lawson is a 29 y.o. [id number] at 9 days postpartum who presents to [facility] reporting abdominal pain. She as a primary c/section for breech presentation. Symptoms started last week. Reports right lower quadrant & right side pain that comes & goes. Walking makes pain worse. Was taking unknown pain medication at home but ran out of it yesterday. States she felt like she had fever yesterday but didn't check her temperature.  Denies n/v. Endorses some dysuria since her surgery. No hematuria.   Location: abdomen Quality: sharp, throbbing Severity: 7/10 on pain scale Duration: 1 week Timing: intermittent Modifying factors: worse with walking. Improved with pain meds.  Associated signs and symptoms: dysuria, ?fever  Past Medical History:  Diagnosis Date  . Medical history non-contributory    OB History  Gravida Para Term Preterm AB Living  0 0 3  SAB TAB Ectopic Multiple Live Births  0 0 0 0 3    # Outcome Date GA Lbr Len/2nd Weight Sex Delivery Anes PTL Lv  3 Term 10/26/19 [redacted]w[redacted]d  3147 g F CS-LTranv Spinal  LIV  2 Term      Vag-Spont     1 Term      Vag-Spont      Past Surgical History:  Procedure Laterality Date  . CESAREAN SECTION N/A 10/26/2019   Procedure: CESAREAN SECTION;  Surgeon: Reva Bores, MD;  Location: [facility];  Service: Obstetrics;  Laterality: N/A;  . MANDIBLE SURGERY     Social History   Socioeconomic History  . Marital status: Married    Spouse name: Not on file  . Number of children: Not on file  . Years of education: Not on file  . Highest education level: Not on file  Occupational History  . Not on file  Tobacco Use  . Smoking status: Never Smoker  . Smokeless tobacco: Never Used  Vaping Use  . Vaping Use: Never used  Substance and Sexual Activity  . Alcohol use:  Never  . Drug use: Never  . Sexual activity: Not on file  Other Topics Concern  . Not on file  Social History Narrative  . Not on file   Social Determinants of Health   Financial Resource Strain:   . Difficulty of Paying Living Expenses: Not on file  Food Insecurity:   . Worried About Programme researcher, broadcasting/film/video in the Last Year: Not on file  . Ran Out of Food in the Last Year: Not on file  Transportation Needs:   . Lack of Transportation (Medical): Not on file  . Lack of Transportation (Non-Medical): Not on file  Physical Activity:   . Days of Exercise per Week: Not on file  . Minutes of Exercise per Session: Not on file  Stress:   . Feeling of Stress : Not on file  Social Connections:   . Frequency of Communication with Friends and Family: Not on file  . Frequency of Social Gatherings with Friends and Family: Not on file  . Attends Religious Services: Not on file  . Active Member of Clubs or Organizations: Not on file  . Attends Banker Meetings: Not on file  . Marital Status: Not on file  Intimate Partner Violence:   . Fear of Current or Ex-Partner: Not on file  . Emotionally Abused: Not on  file  . Physically Abused: Not on file  . Sexually Abused: Not on file   Family History  Problem Relation Age of Onset  . Hypertension Father    No current facility-administered medications on file prior to encounter.   Current Outpatient Medications on File Prior to Encounter  Medication Sig Dispense Refill  . acetaminophen (TYLENOL) 325 MG tablet Take 650 mg by mouth every 6 (six) hours as needed for moderate pain or headache.    . calcium carbonate (TUMS - DOSED IN MG ELEMENTAL CALCIUM) 500 MG chewable tablet Chew 1 tablet by mouth 3 (three) times daily as needed for indigestion or heartburn.    . docusate sodium (COLACE) 100 MG capsule Take 1 capsule (100 mg total) by mouth daily as needed. 30 capsule 2  . oxyCODONE-acetaminophen (PERCOCET) 5-325 MG tablet Take 1 tablet  by mouth every 4 (four) hours as needed for severe pain. 20 tablet 0  . Prenatal 27-1 MG TABS Take 1 tablet by mouth daily.     No Known Allergies  I have reviewed patient's Past Medical Hx, Surgical Hx, Family Hx, Social Hx, medications and allergies.   Review of Systems  Constitutional: Positive for fever. Negative for chills.  Gastrointestinal: Positive for abdominal pain. Negative for constipation, diarrhea, nausea and vomiting.  Genitourinary: Positive for dysuria. Negative for flank pain, frequency and vaginal discharge.    OBJECTIVE Patient Vitals for the past 24 hrs:  BP Temp Temp src Pulse Resp SpO2  11/05/19 0240 (!) 132/95 -- -- 98 -- --  11/05/19 0208 (!) 136/98 -- -- (!) 105 -- --  11/05/19 0207 (!) 148/79 98.2 F (36.8 C) -- (!) 105 18 --  11/04/19 1736 131/79 99 F (37.2 C) Oral (!) 107 16 100 %   Constitutional: Well-developed, well-nourished female in no acute distress.  Cardiovascular: normal rate & rhythm, no murmur Respiratory: normal rate and effort. Lung sounds clear throughout GI: Incision clean & intact, no erythema or drainage. Significantly tender to palpation in RLQ & right mid abdomen. Abd soft,  Pos BS x 4. No guarding or rebound tenderness MS: Extremities nontender, no edema, normal ROM Neurologic: Alert and oriented x 4.      LAB RESULTS Results for orders placed or performed during the hospital encounter of 11/04/19 (from the past 24 hour(s))  CBC with Differential/Platelet     Status: Abnormal   Collection Time: 11/04/19  6:55 PM  Result Value Ref Range   WBC 10.0 4.0 - 10.5 K/uL   RBC 4.42 3.87 - 5.11 MIL/uL   Hemoglobin 11.9 (L) 12.0 - 15.0 g/dL   HCT 40.9 36 - 46 %   MCV 84.6 80.0 - 100.0 fL   MCH 26.9 26.0 - 34.0 pg   MCHC 31.8 30.0 - 36.0 g/dL   RDW 81.1 91.4 - 78.2 %   Platelets 582 (H) 150 - 400 K/uL   nRBC 0.0 0.0 - 0.2 %   Neutrophils Relative % 58 %   Neutro Abs 5.8 1.7 - 7.7 K/uL   Lymphocytes Relative 32 %   Lymphs Abs 3.2  0.7 - 4.0 K/uL   Monocytes Relative 8 %   Monocytes Absolute 0.8 0 - 1 K/uL   Eosinophils Relative 2 %   Eosinophils Absolute 0.2 0 - 0 K/uL   Basophils Relative 0 %   Basophils Absolute 0.0 0 - 0 K/uL   Immature Granulocytes 0 %   Abs Immature Granulocytes 0.03 0.00 - 0.07 K/uL  Urinalysis, Routine w  reflex microscopic     Status: Abnormal   Collection Time: 11/04/19  7:09 PM  Result Value Ref Range   Color, Urine YELLOW YELLOW   APPearance CLEAR CLEAR   Specific Gravity, Urine 1.017 1.005 - 1.030   pH 5.0 5.0 - 8.0   Glucose, UA NEGATIVE NEGATIVE mg/dL   Hgb urine dipstick LARGE (A) NEGATIVE   Bilirubin Urine NEGATIVE NEGATIVE   Ketones, ur NEGATIVE NEGATIVE mg/dL   Protein, ur NEGATIVE NEGATIVE mg/dL   Nitrite NEGATIVE NEGATIVE   Leukocytes,Ua SMALL (A) NEGATIVE   RBC / HPF >50 (H) 0 - 5 RBC/hpf   WBC, UA 21-50 0 - 5 WBC/hpf   Bacteria, UA NONE SEEN NONE SEEN   Squamous Epithelial / LPF 0-5 0 - 5  Comprehensive metabolic panel     Status: Abnormal   Collection Time: 11/04/19  9:12 PM  Result Value Ref Range   Sodium 137 135 - 145 mmol/L   Potassium 3.8 3.5 - 5.1 mmol/L   Chloride 105 98 - 111 mmol/L   CO2 24 22 - 32 mmol/L   Glucose, Bld 104 (H) 70 - 99 mg/dL   BUN 9 6 - 20 mg/dL   Creatinine, Ser 1.61 0.44 - 1.00 mg/dL   Calcium 8.8 (L) 8.9 - 10.3 mg/dL   Total Protein 6.8 6.5 - 8.1 g/dL   Albumin 2.7 (L) 3.5 - 5.0 g/dL   AST 20 15 - 41 U/L   ALT 25 0 - 44 U/L   Alkaline Phosphatase 98 38 - 126 U/L   Total Bilirubin 0.2 (L) 0.3 - 1.2 mg/dL   GFR calc non Af Amer >60 >60 mL/min   GFR calc Af Amer >60 >60 mL/min   Anion gap 8 5 - 15    IMAGING CT ABDOMEN PELVIS W CONTRAST  Result Date: 11/05/2019 CLINICAL DATA:  29 year old female with right lower quadrant abdominal pain. EXAM: CT ABDOMEN AND PELVIS WITH CONTRAST TECHNIQUE: Multidetector CT imaging of the abdomen and pelvis was performed using the standard protocol following bolus administration of intravenous  contrast. CONTRAST:  OMNIPAQUE IOHEXOL 300 MG/ML  SOLN COMPARISON:  None. FINDINGS: Lower chest: The visualized lung bases are clear. No intra-abdominal free air. There is a small free fluid in the pelvis. Hepatobiliary: The liver is unremarkable. No intrahepatic biliary dilatation. Gallstone. No pericholecystic fluid or evidence of acute cholecystitis by CT. Pancreas: Unremarkable. No pancreatic ductal dilatation or surrounding inflammatory changes. Spleen: No acute findings. Adrenals/Urinary Tract: The adrenal glands unremarkable. Mild fullness of the renal collecting systems bilaterally. There is symmetric enhancement and excretion of contrast by both kidneys. The urinary bladder is distended. Stomach/Bowel: There is no bowel obstruction or active inflammation. There is moderate stool throughout the colon. The appendix is unremarkable. Vascular/Lymphatic: The abdominal aorta and IVC are unremarkable. No portal venous gas. There is no adenopathy. Reproductive: The uterus is enlarged. The uterus is anteverted. Focal area of irregularity in the anterior lower uterus likely related to prior C-section. There is an ill-defined collection in the right adnexa measuring 5.5 x 4.0 cm most consistent with a ruptured hemorrhagic cyst. Further evaluation with pelvic ultrasound recommended. There is associated mass effect on the bladder dome. The left ovary is unremarkable. Other: There is diastasis of anterior abdominal wall musculature the midline. Musculoskeletal: No acute or significant osseous findings. IMPRESSION: 1. Findings most consistent with a ruptured hemorrhagic cyst in the right adnexa. Further evaluation with pelvic ultrasound recommended. 2. No bowel obstruction. Normal appendix. 3. Cholelithiasis.  Electronically Signed   By: Elgie Collard M.D.   On: 11/05/2019 02:02    MAU COURSE Orders Placed This Encounter  Procedures  . Urine culture  . CT ABDOMEN PELVIS W CONTRAST  . CBC with  Differential/Platelet  . Urinalysis, Routine w reflex microscopic  . Comprehensive metabolic panel  . Diet NPO time specified  . Saline lock IV  . Discharge patient   MDM Pt with high normal temp at 99 & slightly tachycardic. Abdomen soft & good bowel sounds. Patient is very tender throughout right abdomen, specifically RLQ. No rebound & negative heel tap.  Incision is clean & intact.  Questionable right CVA tenderness.   U/a pending.  Ibuprofen ordered CBC - no leukocytosis  Reviewed patient with Dr. Jolayne Panther. Recommends CT abdomen/pelvis  Care turned over to [facility] Judeth Horn, NP 11/04/2019  8:12 PM  CT ABDOMEN PELVIS W CONTRAST  Result Date: 11/05/2019 CLINICAL DATA:  29 year old female with right lower quadrant abdominal pain. EXAM: CT ABDOMEN AND PELVIS WITH CONTRAST TECHNIQUE: Multidetector CT imaging of the abdomen and pelvis was performed using the standard protocol following bolus administration of intravenous contrast. CONTRAST:  OMNIPAQUE IOHEXOL 300 MG/ML  SOLN COMPARISON:  None. FINDINGS: Lower chest: The visualized lung bases are clear. No intra-abdominal free air. There is a small free fluid in the pelvis. Hepatobiliary: The liver is unremarkable. No intrahepatic biliary dilatation. Gallstone. No pericholecystic fluid or evidence of acute cholecystitis by CT. Pancreas: Unremarkable. No pancreatic ductal dilatation or surrounding inflammatory changes. Spleen: No acute findings. Adrenals/Urinary Tract: The adrenal glands unremarkable. Mild fullness of the renal collecting systems bilaterally. There is symmetric enhancement and excretion of contrast by both kidneys. The urinary bladder is distended. Stomach/Bowel: There is no bowel obstruction or active inflammation. There is moderate stool throughout the colon. The appendix is unremarkable. Vascular/Lymphatic: The abdominal aorta and IVC are unremarkable. No portal venous gas. There is no adenopathy.  Reproductive: The uterus is enlarged. The uterus is anteverted. Focal area of irregularity in the anterior lower uterus likely related to prior C-section. There is an ill-defined collection in the right adnexa measuring 5.5 x 4.0 cm most consistent with a ruptured hemorrhagic cyst. Further evaluation with pelvic ultrasound recommended. There is associated mass effect on the bladder dome. The left ovary is unremarkable. Other: There is diastasis of anterior abdominal wall musculature the midline. Musculoskeletal: No acute or significant osseous findings. IMPRESSION: 1. Findings most consistent with a ruptured hemorrhagic cyst in the right adnexa. Further evaluation with pelvic ultrasound recommended. 2. No bowel obstruction. Normal appendix. 3. Cholelithiasis. Electronically Signed   By: Elgie Collard M.D.   On: 11/05/2019 02:02   Results for orders placed or performed during the hospital encounter of 11/04/19 (from the past 24 hour(s))  CBC with Differential/Platelet     Status: Abnormal   Collection Time: 11/04/19  6:55 PM  Result Value Ref Range   WBC 10.0 4.0 - 10.5 K/uL   RBC 4.42 3.87 - 5.11 MIL/uL   Hemoglobin 11.9 (L) 12.0 - 15.0 g/dL   HCT 78.6 36 - 46 %   MCV 84.6 80.0 - 100.0 fL   MCH 26.9 26.0 - 34.0 pg   MCHC 31.8 30.0 - 36.0 g/dL   RDW 76.7 20.9 - 47.0 %   Platelets 582 (H) 150 - 400 K/uL   nRBC 0.0 0.0 - 0.2 %   Neutrophils Relative % 58 %   Neutro Abs 5.8 1.7 - 7.7 K/uL   Lymphocytes  Relative 32 %   Lymphs Abs 3.2 0.7 - 4.0 K/uL   Monocytes Relative 8 %   Monocytes Absolute 0.8 0 - 1 K/uL   Eosinophils Relative 2 %   Eosinophils Absolute 0.2 0 - 0 K/uL   Basophils Relative 0 %   Basophils Absolute 0.0 0 - 0 K/uL   Immature Granulocytes 0 %   Abs Immature Granulocytes 0.03 0.00 - 0.07 K/uL  Urinalysis, Routine w reflex microscopic     Status: Abnormal   Collection Time: 11/04/19  7:09 PM  Result Value Ref Range   Color, Urine YELLOW YELLOW   APPearance CLEAR CLEAR    Specific Gravity, Urine 1.017 1.005 - 1.030   pH 5.0 5.0 - 8.0   Glucose, UA NEGATIVE NEGATIVE mg/dL   Hgb urine dipstick LARGE (A) NEGATIVE   Bilirubin Urine NEGATIVE NEGATIVE   Ketones, ur NEGATIVE NEGATIVE mg/dL   Protein, ur NEGATIVE NEGATIVE mg/dL   Nitrite NEGATIVE NEGATIVE   Leukocytes,Ua SMALL (A) NEGATIVE   RBC / HPF >50 (H) 0 - 5 RBC/hpf   WBC, UA 21-50 0 - 5 WBC/hpf   Bacteria, UA NONE SEEN NONE SEEN   Squamous Epithelial / LPF 0-5 0 - 5  Comprehensive metabolic panel     Status: Abnormal   Collection Time: 11/04/19  9:12 PM  Result Value Ref Range   Sodium 137 135 - 145 mmol/L   Potassium 3.8 3.5 - 5.1 mmol/L   Chloride 105 98 - 111 mmol/L   CO2 24 22 - 32 mmol/L   Glucose, Bld 104 (H) 70 - 99 mg/dL   BUN 9 6 - 20 mg/dL   Creatinine, Ser 1.91 0.44 - 1.00 mg/dL   Calcium 8.8 (L) 8.9 - 10.3 mg/dL   Total Protein 6.8 6.5 - 8.1 g/dL   Albumin 2.7 (L) 3.5 - 5.0 g/dL   AST 20 15 - 41 U/L   ALT 25 0 - 44 U/L   Alkaline Phosphatase 98 38 - 126 U/L   Total Bilirubin 0.2 (L) 0.3 - 1.2 mg/dL   GFR calc non Af Amer >60 >60 mL/min   GFR calc Af Amer >60 >60 mL/min   Anion gap 8 5 - 15   Pain improved with Ibuprofen, appears comfortable. CT shows ovarian cyst and qallstone, no evidence of appendicitis. Consult with Dr. Jolayne Panther. Plan for pain management and f/u in office this week. BP elevated prior to discharge. Review of chart shows borderline HTN prior to hospital discharge last week. Will start Procardia and arrange BP check with incision check this week. Discussed findings and plan with pt via video interpreter. All questions answered. Stable for discharge home. UA with WBCs, culture added.  A/P: 1. Status post cesarean delivery   2. Cyst of right ovary   3. Postpartum hypertension    Discharge home Follow up at [facility] later this week- message sent Rx Ibuprofen Rx Procardia Return precautions  Allergies as of 11/05/2019   No Known Allergies     Medication List     STOP taking these medications   bacitracin ointment     TAKE these medications   acetaminophen 325 MG tablet Commonly known as: TYLENOL Take 650 mg by mouth every 6 (six) hours as needed for moderate pain or headache.   calcium carbonate 500 MG chewable tablet Commonly known as: TUMS - dosed in mg elemental calcium Chew 1 tablet by mouth 3 (three) times daily as needed for indigestion or heartburn.   docusate sodium  100 MG capsule Commonly known as: Colace Take 1 capsule (100 mg total) by mouth daily as needed.   ibuprofen 600 MG tablet Commonly known as: ADVIL Take 1 tablet (600 mg total) by mouth every 6 (six) hours as needed. What changed:   medication strength  how much to take  when to take this  reasons to take this   NIFEdipine 30 MG 24 hr tablet Commonly known as: Procardia XL Take 1 tablet (30 mg total) by mouth daily.   oxyCODONE-acetaminophen 5-325 MG tablet Commonly known as: Percocet Take 1 tablet by mouth every 4 (four) hours as needed for severe pain.   Prenatal 27-1 MG Tabs Take 1 tablet by mouth daily.      Donette Larry, CNM  11/05/2019 3:10 AM

## 2019-11-04 NOTE — MAU Note (Signed)
Deborah Lawson is a 29 y.o. here in MAU reporting: s/p c/s on 10/26/19. Having leg pain and abdominal pain for the past 3 days. Pains are intermittent. Also sometimes has a fever, has not checked temperature at home but states she felt that she had a high.  Onset of complaint: ongoing  Pain score: leg pain 8/10, abdominal pain 9/10  Vitals:   11/04/19 1736  BP: 131/79  Pulse: (!) 107  Resp: 16  Temp: 99 F (37.2 C)  SpO2: 100%     Lab orders placed from triage: none

## 2019-11-05 ENCOUNTER — Ambulatory Visit: Payer: Medicaid Other

## 2019-11-05 ENCOUNTER — Inpatient Hospital Stay (HOSPITAL_COMMUNITY): Payer: Medicaid Other

## 2019-11-05 DIAGNOSIS — Z98891 History of uterine scar from previous surgery: Secondary | ICD-10-CM

## 2019-11-05 DIAGNOSIS — O165 Unspecified maternal hypertension, complicating the puerperium: Secondary | ICD-10-CM

## 2019-11-05 DIAGNOSIS — N83201 Unspecified ovarian cyst, right side: Secondary | ICD-10-CM

## 2019-11-05 DIAGNOSIS — O26893 Other specified pregnancy related conditions, third trimester: Secondary | ICD-10-CM

## 2019-11-05 LAB — URINE CULTURE: Culture: <10000 — AB

## 2019-11-05 MED ORDER — NIFEDIPINE ER OSMOTIC RELEASE 30 MG PO TB24
30.0000 mg | ORAL_TABLET | Freq: Every day | ORAL | 1 refills | Status: DC
Start: 2019-11-05 — End: 2020-02-18

## 2019-11-05 MED ORDER — IBUPROFEN 600 MG PO TABS: 600.0000 mg | ORAL_TABLET | Freq: Four times a day (QID) | ORAL | 0 refills | Status: DC | PRN

## 2019-11-05 NOTE — Discharge Instructions (Signed)
Ovarian Cyst An ovarian cyst is a fluid-filled sac on an ovary. The ovaries are organs that make eggs in women. Most ovarian cysts go away on their own and are not cancerous (are benign). Some cysts need treatment. Follow these instructions at home:  Take over-the-counter and prescription medicines only as told by your doctor.  Do not drive or use heavy machinery while taking prescription pain medicine.  Get pelvic exams and Pap tests as often as told by your doctor.  Return to your normal activities as told by your doctor. Ask your doctor what activities are safe for you.  Do not use any products that contain nicotine or tobacco, such as cigarettes and e-cigarettes. If you need help quitting, ask your doctor.  Keep all follow-up visits as told by your doctor. This is important. Contact a doctor if:  Your periods are: ? Late. ? Irregular. ? Painful.   Your periods stop.  You have pelvic pain that does not go away.  You have pressure on your bladder.  You have trouble making your bladder empty when you pee (urinate).  You have pain during sex.  You have any of the following in your belly (abdomen): ? A feeling of fullness. ? Pressure. ? Discomfort. ? Pain that does not go away. ? Swelling.  You feel sick most of the time.  You have trouble pooping (have constipation).  You are not as hungry as usual (you lose your appetite).  You get very bad acne.  You start to have more hair on your body and face.  You are gaining weight or losing weight without changing your exercise and eating habits.  You think you may be pregnant. Get help right away if:  You have belly pain that is very bad or gets worse.  You cannot eat or drink without throwing up (vomiting).  You suddenly get a fever.  Your period is a lot heavier than usual. This information is not intended to replace advice given to you by your health care provider. Make sure you discuss any questions you have  with your health care provider. Document Revised: 02/11/2017 Document Reviewed: 08/03/2015 Elsevier Patient Education  2020 [employer]  

## 2019-11-08 ENCOUNTER — Encounter: Payer: Self-pay | Admitting: *Deleted

## 2019-11-08 ENCOUNTER — Other Ambulatory Visit: Payer: Self-pay

## 2019-11-08 ENCOUNTER — Ambulatory Visit (INDEPENDENT_AMBULATORY_CARE_PROVIDER_SITE_OTHER): Payer: Medicaid Other | Admitting: *Deleted

## 2019-11-08 VITALS — BP 132/79 | HR 120 | Temp 98.3°F | Ht 68.0 in | Wt 187.9 lb

## 2019-11-08 DIAGNOSIS — Z4889 Encounter for other specified surgical aftercare: Secondary | ICD-10-CM

## 2019-11-08 DIAGNOSIS — Z0130 Encounter for examination of blood pressure without abnormal findings: Secondary | ICD-10-CM

## 2019-11-08 MED ORDER — BLOOD PRESSURE KIT DEVI: 1.0000 | 0 refills | Status: DC | PRN

## 2019-11-08 NOTE — Progress Notes (Signed)
Video interpreter [id number] used for encounter. Pt presents for incision and blood pressure check following C/S delivery on 8/13. BP - 132/79, P - 120. Pt states she began taking Nifedipine as directed on 8/23 following MAU visit. She reports H/A since visit to hospital on 8/22. Pt denies visual disturbances or RUQ pain. She takes ibuprofen q6hrs and this has caused the pain to be milder but pain is not completely resolved. Current pain scale for H/A - 5. Steri-strips observed to be intact @ operative site and these were removed. Incision was found to be well-healed and without swelling, redness or drainage. Cleansing instructions and wound care were provided. Pt also reports that she continues to have RLQ pain (pain scale 4) which is less than when last seen @ MAU on 8/22. Per chart review, pt had CT scan on 8/23 which showed probable ruptured hemorrhagic cyst in the rt adnexa. Consult completed with Dr. Donavan Foil regarding pt status. Pt was then advised that since she is breastfeeding, she should continue taking ibuprofen for her pain relief. She may also add Tylenol q6 hrs prn. Pt was instructed to continue taking Nifedipine as directed. She will be informed @ her PP visit whether she will need to continue taking the medication longer. Pt requested BP cuff. Rx e-prescribed and pt was instructed to pick it up from the [facility]. Pt was instructed to check BP daily and if >140/90, she should wait one hour and then re-check. If BP persists >140/90, she should return to MAU for evaluation. Pt was also instructed to return to MAU if she develops visual disturbances or worsening H/A. Pt received prenatal care @ [facility] and will schedule PP visit to occur in 2-4 weeks. Pt voiced understanding of all information and instructions given.

## 2019-11-09 NOTE — Progress Notes (Signed)
Patient was assessed and managed by nursing staff during this encounter. I have reviewed the chart and agree with the documentation and plan. I have also made any necessary editorial changes.  Warden Fillers, MD 11/09/2019 8:19 AM

## 2019-12-31 ENCOUNTER — Other Ambulatory Visit: Payer: Self-pay | Admitting: Certified Nurse Midwife

## 2020-02-18 ENCOUNTER — Encounter (HOSPITAL_COMMUNITY): Payer: Self-pay

## 2020-02-18 ENCOUNTER — Other Ambulatory Visit: Payer: Self-pay

## 2020-02-18 ENCOUNTER — Other Ambulatory Visit: Payer: Self-pay | Admitting: Certified Nurse Midwife

## 2020-02-18 ENCOUNTER — Ambulatory Visit (HOSPITAL_COMMUNITY)
Admission: EM | Admit: 2020-02-18 | Discharge: 2020-02-18 | Disposition: A | Discharge status: Home / Self Care | Payer: Medicaid Other | Attending: Internal Medicine | Admitting: Internal Medicine

## 2020-02-18 DIAGNOSIS — R5383 Other fatigue: Secondary | ICD-10-CM | POA: Insufficient documentation

## 2020-02-18 LAB — VITAMIN D 25 HYDROXY (VIT D DEFICIENCY, FRACTURES): Vit D, 25-Hydroxy: 17.47 ng/mL — ABNORMAL LOW (ref 30–100)

## 2020-02-18 LAB — POC URINE PREG, ED: Preg Test, Ur: NEGATIVE

## 2020-02-18 NOTE — ED Triage Notes (Signed)
Pt presents for pregnancy test. Denies abdominal pan, dysuria vaginal discharge.  Pt reports having body aches x 4 days. Denies fever, chills, cough.

## 2020-02-18 NOTE — Discharge Instructions (Addendum)
Please sign up for my chart If your labs are abnormal we will give you a call with recommendations.

## 2020-02-20 NOTE — ED Provider Notes (Signed)
[place] [facility]    CSN: [id number] Arrival date & time: 02/18/20  1152      History   Chief Complaint Chief Complaint  Patient presents with  . Possible Pregnancy  . Generalized Body Aches    HPI Deborah Lawson is a 29 y.o. female comes to the urgent care requesting pregnancy test.  Patient also complains of generalized body aches.  No fever or chills.  She has had some nausea and is concerned that she might be pregnant.  Patient is lactating mother who has a 18-monthold son.  Patient is currently on birth control and has not had menses in 3 months.  On further questioning the patient says the baby sleeps throughout the night.  She apparently has support caring for the baby at home.  She denies feeling depressed. HPI  Past Medical History:  Diagnosis Date  . Medical history non-contributory     Patient Active Problem List   Diagnosis Date Noted  . Breech presentation of fetus 10/26/2019  . Language barrier 10/12/2019  . Malpresentation before onset of labor 10/12/2019    Past Surgical History:  Procedure Laterality Date  . CESAREAN SECTION N/A 10/26/2019   Procedure: CESAREAN SECTION;  Surgeon: PDonnamae Jude MD;  Location: [facility];  Service: Obstetrics;  Laterality: N/A;  . MANDIBLE SURGERY      OB History    Gravida  3   Para  3   Term  3   Preterm  0   AB  0   Living  3     SAB  0   TAB  0   Ectopic  0   Multiple  0   Live Births  3            Home Medications    Prior to Admission medications   Medication Sig Start Date End Date Taking? Authorizing Provider  Blood Pressure Monitoring (BLOOD PRESSURE KIT) DEVI 1 Device by Does not apply route as needed. 11/08/19   BGriffin Basil MD  norethindrone (MICRONOR) 0.35 MG tablet Take 1 tablet by mouth daily. 12/07/19   [provider]  NIFEdipine (PROCARDIA XL) 30 MG 24 hr tablet Take 1 tablet (30 mg total) by mouth daily. 11/05/19 02/18/20  BJulianne Handler CNM    Family  History Family History  Problem Relation Age of Onset  . Hypertension Father     Social History Social History   Tobacco Use  . Smoking status: Never Smoker  . Smokeless tobacco: Never Used  Vaping Use  . Vaping Use: Never used  Substance Use Topics  . Alcohol use: Never  . Drug use: Never     Allergies   Patient has no known allergies.   Review of Systems Review of Systems   Physical Exam Triage Vital Signs ED Triage Vitals  Enc Vitals Group     BP 02/18/20 1228 125/74     Pulse Rate 02/18/20 1228 99     Resp 02/18/20 1228 16     Temp 02/18/20 1228 98.1 F (36.7 C)     Temp Source 02/18/20 1228 Oral     SpO2 02/18/20 1228 100 %     Weight --      Height --      Head Circumference --      Peak Flow --      Pain Score 02/18/20 1235 0     Pain Loc --      Pain Edu? --  Excl. in GC? --    No data found.  Updated Vital Signs BP 119/81 (BP Location: Left Arm)   Pulse 95   Temp 98.4 F (36.9 C) (Oral)   Resp 18   LMP  (Within Months) Comment: September 2021  SpO2 100%   Breastfeeding Yes   Visual Acuity Right Eye Distance:   Left Eye Distance:   Bilateral Distance:    Right Eye Near:   Left Eye Near:    Bilateral Near:     Physical Exam   UC Treatments / Results  Labs (all labs ordered are listed, but only abnormal results are displayed) Labs Reviewed  VITAMIN D 25 HYDROXY (VIT D DEFICIENCY, FRACTURES) - Abnormal; Notable for the following components:      Result Value   Vit D, 25-Hydroxy 17.47 (*)    All other components within normal limits  POC URINE PREG, ED    EKG   Radiology No results found.  Procedures Procedures (including critical care time)  Medications Ordered in UC Medications - No data to display  Initial Impression / Assessment and Plan / UC Course  I have reviewed the triage vital signs and the nursing notes.  Pertinent labs & imaging results that were available during my care of the patient were reviewed  by me and considered in my medical decision making (see chart for details).     1.  Negative pregnancy test: Patient was reassured that the likelihood of being pregnant is very small given that she is breast-feeding exclusively and she is on birth control.  2.  Generalized body aches with fatigue: I will check vitamin D level. If the vitamin D level is low I will supplement Patient is encouraged to continue taking vitamins. Final Clinical Impressions(s) / UC Diagnoses   Final diagnoses:  Other fatigue     Discharge Instructions     Please sign up for my chart If your labs are abnormal we will give you a call with recommendations.   ED Prescriptions    None     PDMP not reviewed this encounter.   Chase Picket, MD 02/20/20 [id number]

## 2020-03-25 ENCOUNTER — Other Ambulatory Visit: Payer: Medicaid Other

## 2020-04-23 ENCOUNTER — Encounter (HOSPITAL_COMMUNITY): Payer: Self-pay

## 2020-04-23 ENCOUNTER — Other Ambulatory Visit: Payer: Self-pay

## 2020-04-23 ENCOUNTER — Emergency Department (HOSPITAL_COMMUNITY): Payer: Medicaid Other

## 2020-04-23 ENCOUNTER — Encounter: Payer: Self-pay | Admitting: Neurology

## 2020-04-23 ENCOUNTER — Emergency Department (HOSPITAL_COMMUNITY)
Admission: EM | Admit: 2020-04-23 | Discharge: 2020-04-23 | Disposition: A | Discharge status: Home / Self Care | Payer: Medicaid Other | Attending: Emergency Medicine | Admitting: Emergency Medicine

## 2020-04-23 DIAGNOSIS — R519 Headache, unspecified: Secondary | ICD-10-CM | POA: Diagnosis present

## 2020-04-23 DIAGNOSIS — G43109 Migraine with aura, not intractable, without status migrainosus: Secondary | ICD-10-CM | POA: Insufficient documentation

## 2020-04-23 LAB — BASIC METABOLIC PANEL
Anion gap: 11 (ref 5–15)
BUN: 12 mg/dL (ref 6–20)
CO2: 23 mmol/L (ref 22–32)
Calcium: 9.4 mg/dL (ref 8.9–10.3)
Chloride: 105 mmol/L (ref 98–111)
Creatinine, Ser: 0.58 mg/dL (ref 0.44–1.00)
GFR, Estimated: >60 mL/min (ref >60)
Glucose, Bld: 106 mg/dL — ABNORMAL HIGH (ref 70–99)
Potassium: 4.2 mmol/L (ref 3.5–5.1)
Sodium: 139 mmol/L (ref 135–145)

## 2020-04-23 LAB — CBC
HCT: 40.2 % (ref 36.0–46.0)
Hemoglobin: 13.2 g/dL (ref 12.0–15.0)
MCH: 25.7 pg — ABNORMAL LOW (ref 26.0–34.0)
MCHC: 32.8 g/dL (ref 30.0–36.0)
MCV: 78.2 fL — ABNORMAL LOW (ref 80.0–100.0)
Platelets: 505 10*3/uL — ABNORMAL HIGH (ref 150–400)
RBC: 5.14 MIL/uL — ABNORMAL HIGH (ref 3.87–5.11)
RDW: 15.6 % — ABNORMAL HIGH (ref 11.5–15.5)
WBC: 7.3 10*3/uL (ref 4.0–10.5)
nRBC: 0 % (ref 0.0–0.2)

## 2020-04-23 LAB — I-STAT BETA HCG BLOOD, ED (MC, WL, AP ONLY): I-stat hCG, quantitative: <5 m[IU]/mL (ref <5)

## 2020-04-23 IMAGING — CT CT HEAD W/O CM
4 series · 16 of 47 positions shown, 18 images · non-contrast
Comparison: No pertinent prior exams available for comparison.

CLINICAL DATA: Headache, new or worsening. Prior head injury.
Additional history provided: Patient reports left-sided headache
with blurred vision for 2 days.

EXAM:
CT HEAD WITHOUT CONTRAST
TECHNIQUE: Contiguous axial images were obtained from the base of the skull
through the vertex without intravenous contrast.

[Series 3: head bone · axial · 0.45mm/px · z∈[-107,-77]mm · 3 of 76 slices shown]
[im 8/76  bone]
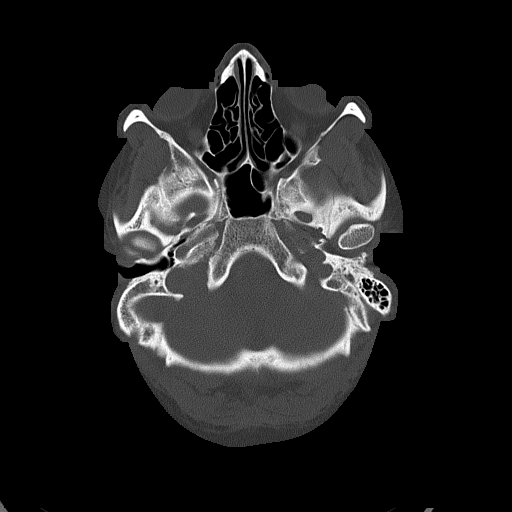
[im 16/76  bone]
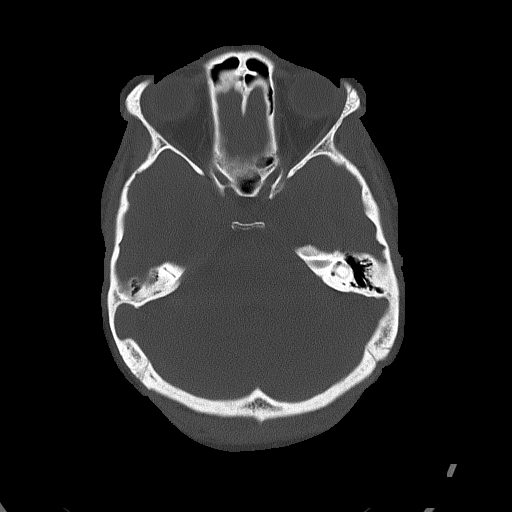
[im 23/76  bone]
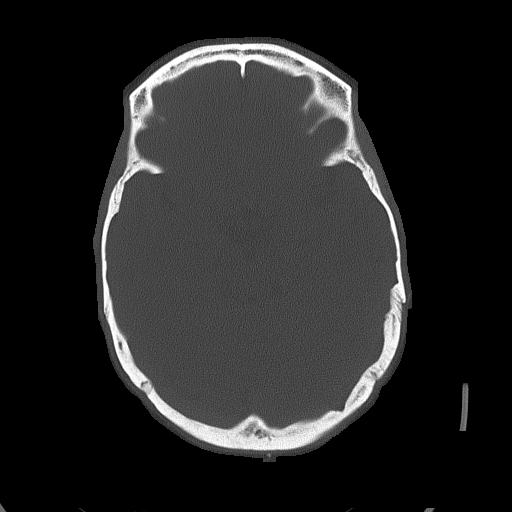

[Series 4: head without · axial · non-contrast · 0.45mm/px · z∈[-106,+9]mm · 7 of 31 slices shown, 9 images]
[im 4/31  brain]
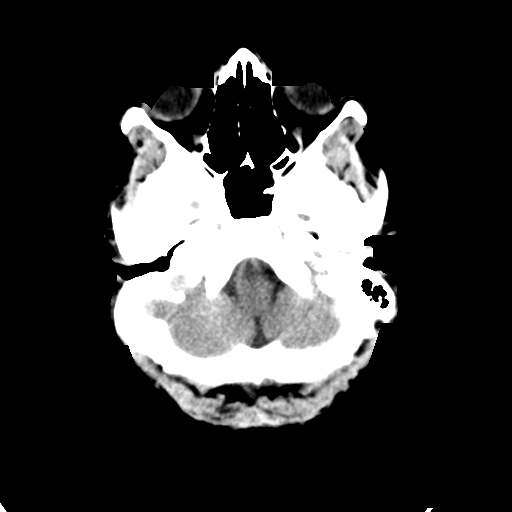
[im 4/31  bone]
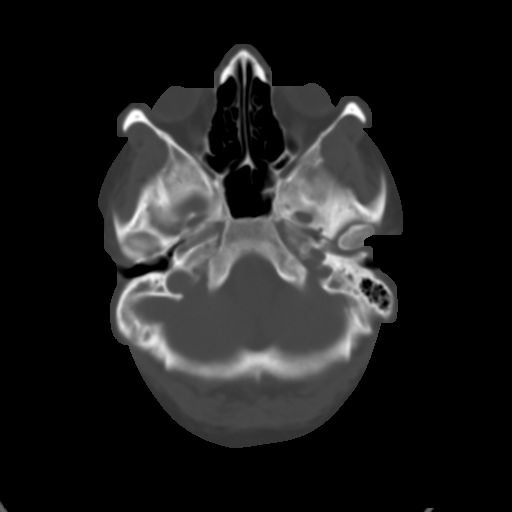
[im 8/31  brain]
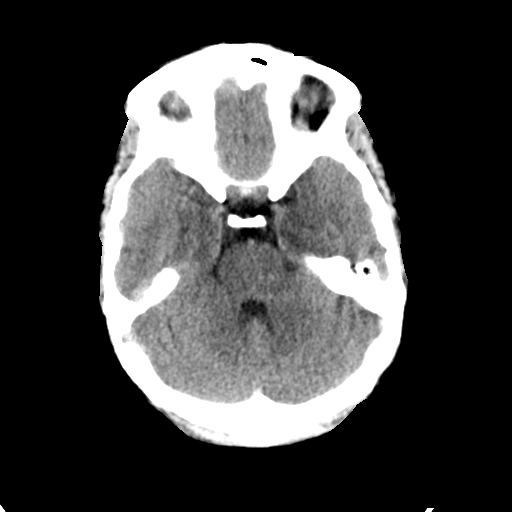
[im 12/31  brain]
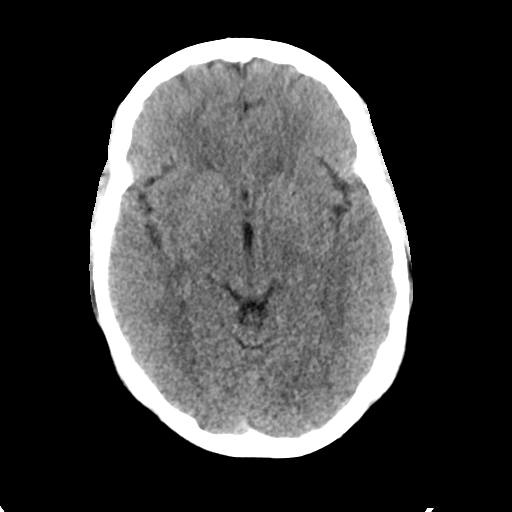
[im 16/31  brain]
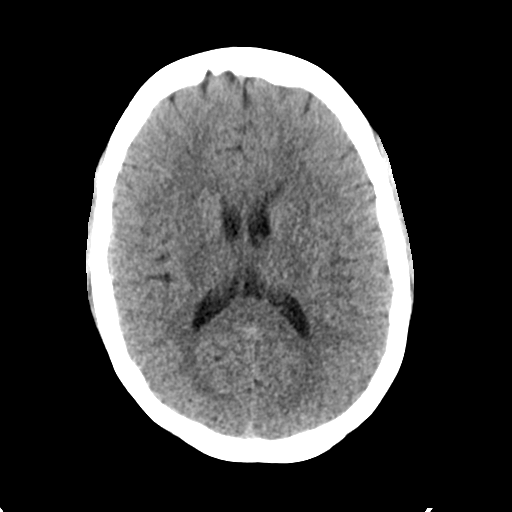
[im 19/31  brain]
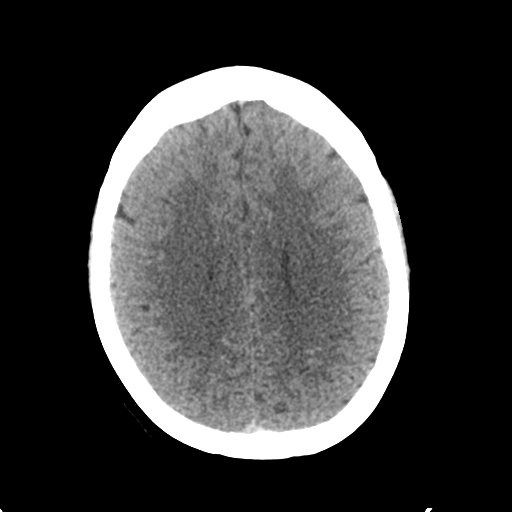
[im 19/31  bone]
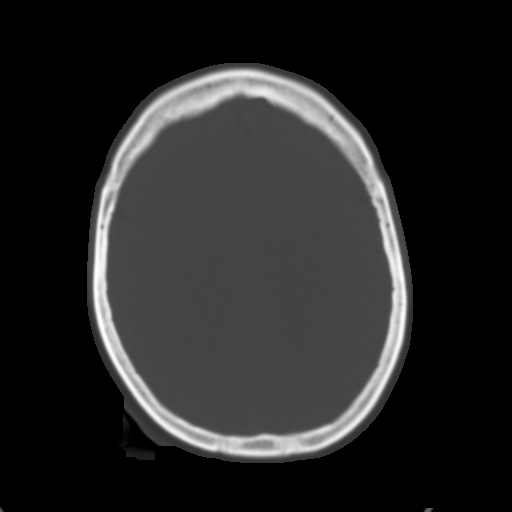
[im 23/31  brain]
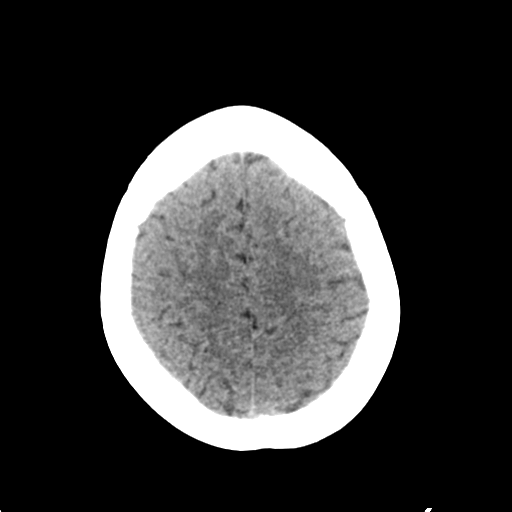
[im 27/31  brain]
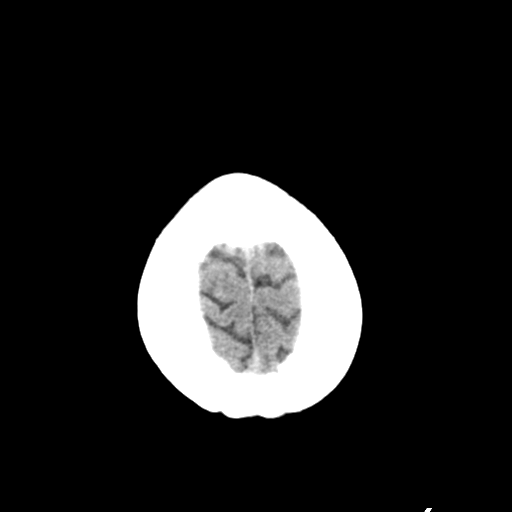

[Series 5: head without cor · coronal · non-contrast · 0.34mm/px · 3 of 66 slices shown]
[im 22/66  brain]
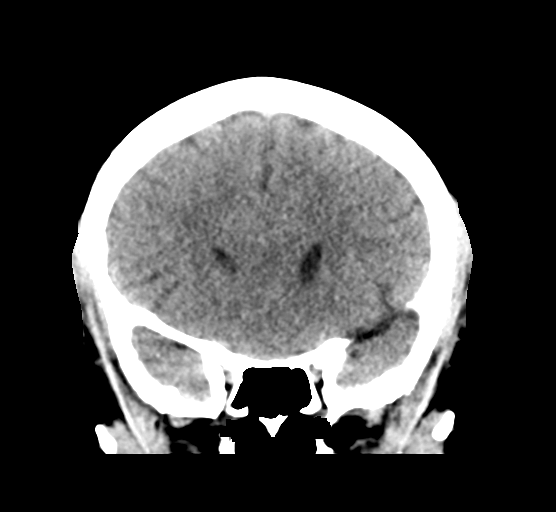
[im 29/66  brain]
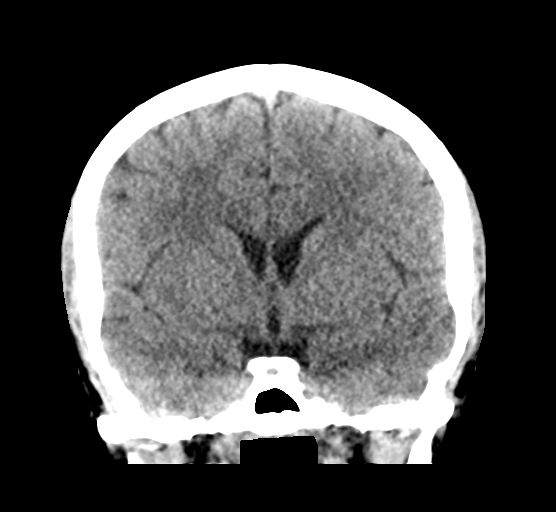
[im 37/66  brain]
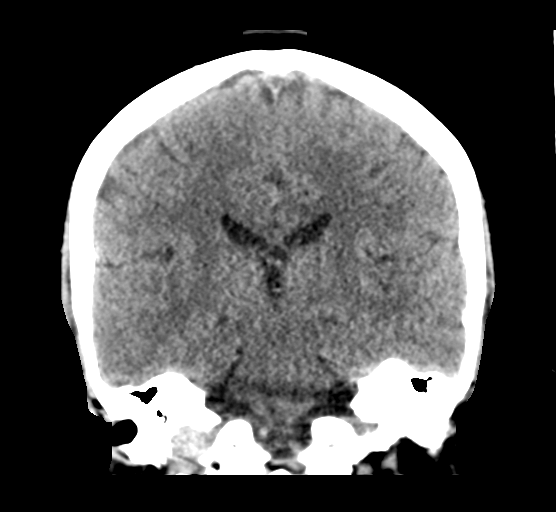

[Series 6: head without sag · sagittal · non-contrast · 0.32mm/px · 3 of 53 slices shown]
[im 18/53  brain]
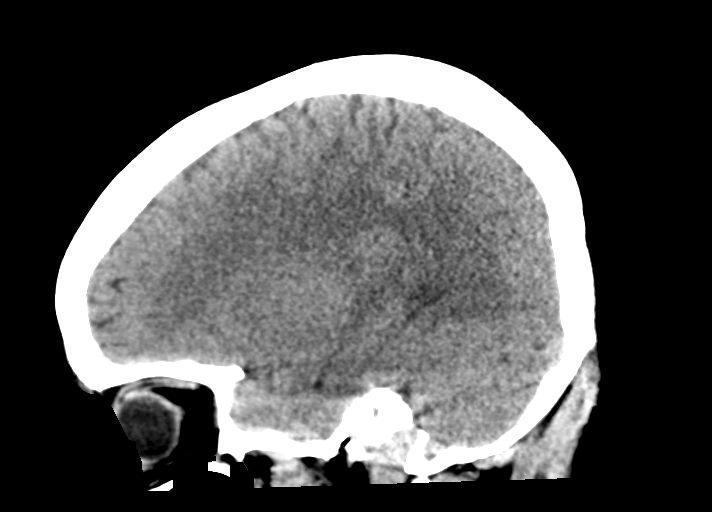
[im 27/53  brain]
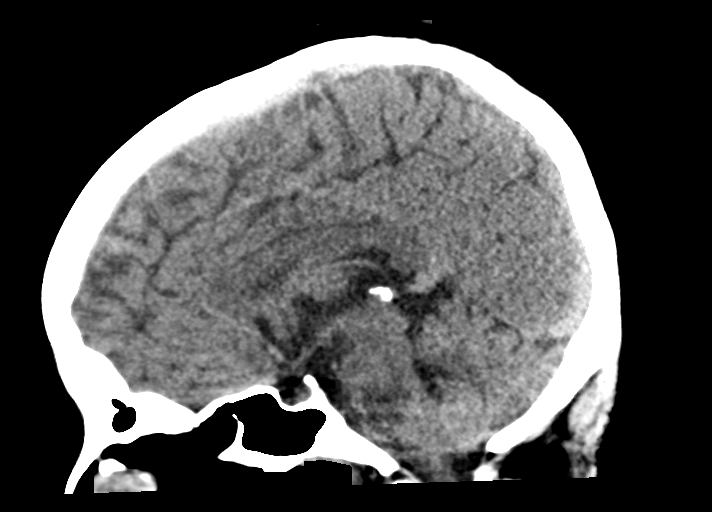
[im 35/53  brain]
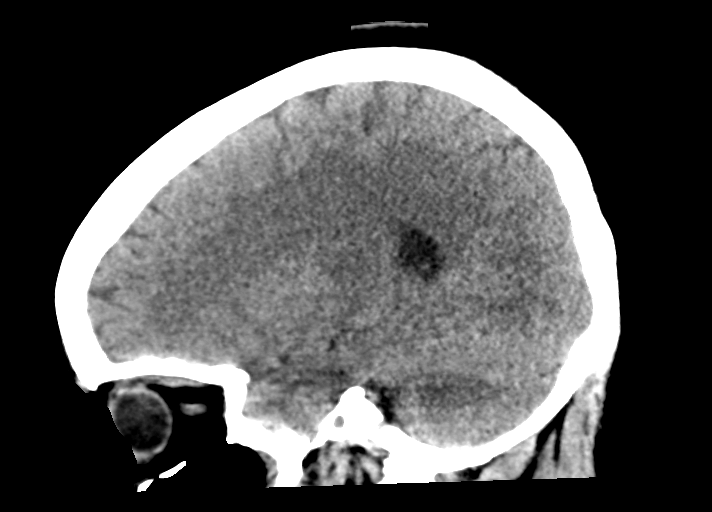

[16 of 47 positions shown; findings below may reference images not displayed]

FINDINGS: Brain:

Cerebral volume is normal.

There is no acute intracranial hemorrhage.

No demarcated cortical infarct.

No extra-axial fluid collection.

No evidence of intracranial mass.

No midline shift.

Vascular: No hyperdense vessel.

Skull: Normal. Negative for fracture or focal lesion.

Sinuses/Orbits: Visualized orbits show no acute finding. 6 mm right
frontal ethmoidal sinus osteoma. Visualized paranasal sinuses
otherwise normally aerated.

Other: Incidentally noted congenital nonunion of the posterior arch
of C1.
IMPRESSION: Unremarkable non-contrast CT appearance of the brain. No evidence of
acute intracranial abnormality.

Incidentally noted 6 mm right frontoethmoidal sinus osteoma.

## 2020-04-23 MED ORDER — ACETAMINOPHEN 500 MG PO TABS
1000.0000 mg | ORAL_TABLET | Freq: Once | ORAL | Status: AC
  Administered 2020-04-23: 1000 mg via ORAL
  Filled 2020-04-23: qty 2

## 2020-04-23 MED ORDER — METOCLOPRAMIDE HCL 5 MG/ML IJ SOLN
10.0000 mg | Freq: Once | INTRAMUSCULAR | Status: AC
  Administered 2020-04-23: 10 mg via INTRAMUSCULAR
  Filled 2020-04-23: qty 2

## 2020-04-23 MED ORDER — IBUPROFEN 600 MG PO TABS: 600.0000 mg | ORAL_TABLET | Freq: Four times a day (QID) | ORAL | 0 refills | Status: DC | PRN

## 2020-04-23 MED ORDER — IBUPROFEN 400 MG PO TABS
400.0000 mg | ORAL_TABLET | Freq: Once | ORAL | Status: AC
  Administered 2020-04-23: 400 mg via ORAL
  Filled 2020-04-23: qty 1

## 2020-04-23 NOTE — ED Provider Notes (Signed)
[facility] EMERGENCY DEPARTMENT Provider Note   CSN: [id number] Arrival date & time: 04/23/20  8938     History Chief Complaint  Patient presents with  . Headache    Deborah Lawson is a 30 y.o. female.  The history is provided by the patient.  Headache Pain location:  L parietal Quality:  Dull Radiates to:  Eyes Severity currently:  7/10 Severity at highest:  7/10 Onset quality:  Gradual Duration:  2 days Timing:  Constant Progression:  Waxing and waning Chronicity:  Recurrent Similar to prior headaches: no   Context: bright light and loud noise   Relieved by:  None tried Worsened by:  Light Ineffective treatments:  None tried Associated symptoms: blurred vision and eye pain   Associated symptoms: no congestion, no cough, no diarrhea, no dizziness, no ear pain, no fatigue, no fever, no focal weakness, no hearing loss, no loss of balance, no myalgias, no nausea, no paresthesias and no photophobia        Past Medical History:  Diagnosis Date  . Medical history non-contributory     Patient Active Problem List   Diagnosis Date Noted  . Breech presentation of fetus 10/26/2019  . Language barrier 10/12/2019  . Malpresentation before onset of labor 10/12/2019    Past Surgical History:  Procedure Laterality Date  . CESAREAN SECTION N/A 10/26/2019   Procedure: CESAREAN SECTION;  Surgeon: Donnamae Jude, MD;  Location: [facility];  Service: Obstetrics;  Laterality: N/A;  . MANDIBLE SURGERY       OB History    Gravida  3   Para  3   Term  3   Preterm  0   AB  0   Living  3     SAB  0   IAB  0   Ectopic  0   Multiple  0   Live Births  3           Family History  Problem Relation Age of Onset  . Hypertension Father     Social History   Tobacco Use  . Smoking status: Never Smoker  . Smokeless tobacco: Never Used  Vaping Use  . Vaping Use: Never used  Substance Use Topics  . Alcohol use: Never  . Drug use: Never     Home Medications Prior to Admission medications   Medication Sig Start Date End Date Taking? Authorizing Provider  Blood Pressure Monitoring (BLOOD PRESSURE KIT) DEVI 1 Device by Does not apply route as needed. 11/08/19   Griffin Basil, MD  norethindrone (MICRONOR) 0.35 MG tablet Take 1 tablet by mouth daily. 12/07/19   [provider]  NIFEdipine (PROCARDIA XL) 30 MG 24 hr tablet Take 1 tablet (30 mg total) by mouth daily. 11/05/19 02/18/20  Julianne Handler, CNM    Allergies    Patient has no known allergies.  Review of Systems   Review of Systems  Constitutional: Negative for fatigue and fever.  HENT: Negative for congestion, ear pain and hearing loss.   Eyes: Positive for blurred vision and pain. Negative for photophobia.  Respiratory: Negative for cough.   Gastrointestinal: Negative for diarrhea and nausea.  Musculoskeletal: Negative for myalgias.  Neurological: Positive for headaches. Negative for dizziness, focal weakness, paresthesias and loss of balance.  All other systems reviewed and are negative.   Physical Exam Updated Vital Signs BP (!) 122/91   Pulse 98   Temp 98.2 F (36.8 C) (Oral)   Resp 16  SpO2 100%   Physical Exam Vitals and nursing note reviewed.  Constitutional:      General: She is not in acute distress.    Appearance: She is well-developed and well-nourished.  HENT:     Head: Normocephalic and atraumatic.     Right Ear: Tympanic membrane normal.     Left Ear: Tympanic membrane normal.     Nose: Nose normal.     Mouth/Throat:     Mouth: Mucous membranes are moist.  Eyes:     Extraocular Movements: Extraocular movements intact and EOM normal.     Pupils: Pupils are equal, round, and reactive to light.     Funduscopic exam:    Right eye: No papilledema.        Left eye: No papilledema.  Cardiovascular:     Rate and Rhythm: Normal rate and regular rhythm.     Pulses: Intact distal pulses.     Heart sounds: Normal heart sounds.  No murmur heard. No friction rub.  Pulmonary:     Effort: Pulmonary effort is normal.     Breath sounds: Normal breath sounds. No wheezing or rales.  Musculoskeletal:        General: No tenderness. Normal range of motion.     Cervical back: Normal range of motion and neck supple. No tenderness.     Comments: No edema  Lymphadenopathy:     Cervical: No cervical adenopathy.  Skin:    General: Skin is warm and dry.     Findings: No rash.  Neurological:     General: No focal deficit present.     Mental Status: She is alert and oriented to person, place, and time. Mental status is at baseline.     Cranial Nerves: No cranial nerve deficit.     Sensory: No sensory deficit.     Motor: No weakness.     Gait: Gait normal.     Deep Tendon Reflexes: Strength normal.     Comments: photophobia  Psychiatric:        Mood and Affect: Mood and affect and mood normal.        Behavior: Behavior normal.        Thought Content: Thought content normal.     ED Results / Procedures / Treatments   Labs (all labs ordered are listed, but only abnormal results are displayed) Labs Reviewed  BASIC METABOLIC PANEL - Abnormal; Notable for the following components:      Result Value   Glucose, Bld 106 (*)    All other components within normal limits  CBC - Abnormal; Notable for the following components:   RBC 5.14 (*)    MCV 78.2 (*)    MCH 25.7 (*)    RDW 15.6 (*)    Platelets 505 (*)    All other components within normal limits  I-STAT BETA HCG BLOOD, ED ([facility], [facility], [facility] ONLY)    EKG None  Radiology CT Head Wo Contrast  Result Date: 04/23/2020 CLINICAL DATA:  Headache, new or worsening. Prior head injury. Additional history provided: Patient reports left-sided headache with blurred vision for 2 days. EXAM: CT HEAD WITHOUT CONTRAST TECHNIQUE: Contiguous axial images were obtained from the base of the skull through the vertex without intravenous contrast. COMPARISON:  No pertinent prior exams available  for comparison. FINDINGS: Brain: Cerebral volume is normal. There is no acute intracranial hemorrhage. No demarcated cortical infarct. No extra-axial fluid collection. No evidence of intracranial mass. No midline shift. Vascular: No hyperdense vessel.  Skull: Normal. Negative for fracture or focal lesion. Sinuses/Orbits: Visualized orbits show no acute finding. 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 Visualized paranasal sinuses otherwise normally aerated. 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 IMPRESSION: Unremarkable non-contrast CT appearance of the brain. No evidence of acute intracranial abnormality. 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 Electronically Signed   By: Kellie Simmering DO   On: 04/23/2020 11:20    Procedures Procedures   Medications Ordered in ED Medications  metoCLOPramide (REGLAN) injection 10 mg (10 mg Intramuscular Given 04/23/20 1043)  ibuprofen (ADVIL) tablet 400 mg (400 mg Oral Given 04/23/20 1042)  acetaminophen (TYLENOL) tablet 1,000 mg (1,000 mg Oral Given 04/23/20 1042)    ED Course  I have reviewed the triage vital signs and the nursing notes.  Pertinent labs & imaging results that were available during my care of the patient were reviewed by me and considered in my medical decision making (see chart for details).    MDM Rules/Calculators/A&P                          Pt with migraine HA sx without sx suggestive of SAH(sudden onset, worst of life, or deficits), infection, or cavernous vein thrombosis.  Normal neuro exam and vital signs with mild tachycardia but o/w normal.  Pt has been having headaches for over a year after she had a head injury but the headache that started yesterday was a little different as she has had intermittent blurry vision on the left with this headache and funny lights.  Head CT shows no signs of scaring from her injury and is normal.  Labs done before exam wnl.  Pt is still breast  feeding and was given reglan, ibuprofen/tylenol.     Final Clinical Impression(s) / ED Diagnoses Final diagnoses:  Migraine with aura and without status migrainosus, not intractable    Rx / DC Orders ED Discharge Orders    None       Blanchie Dessert, MD 04/23/20 1254

## 2020-04-23 NOTE — ED Notes (Signed)
Pt states when she gets the HA on the left side of her head, she gets blurred vision in the left eye.

## 2020-04-23 NOTE — ED Notes (Signed)
Patient transported to CT 

## 2020-04-23 NOTE — ED Triage Notes (Signed)
Pt reports 2 days of left sided headache with intermittent vision problems. Pt denies dizziness or nausea. No fever or cough, has not has any COVID vaccines

## 2020-05-31 ENCOUNTER — Encounter (HOSPITAL_COMMUNITY): Payer: Self-pay | Admitting: Emergency Medicine

## 2020-05-31 ENCOUNTER — Emergency Department (HOSPITAL_COMMUNITY)
Admission: EM | Admit: 2020-05-31 | Discharge: 2020-05-31 | Disposition: A | Discharge status: Home / Self Care | Payer: Medicaid Other | Attending: Emergency Medicine | Admitting: Emergency Medicine

## 2020-05-31 DIAGNOSIS — L210 Seborrhea capitis: Secondary | ICD-10-CM

## 2020-05-31 DIAGNOSIS — L219 Seborrheic dermatitis, unspecified: Secondary | ICD-10-CM | POA: Insufficient documentation

## 2020-05-31 DIAGNOSIS — K0889 Other specified disorders of teeth and supporting structures: Secondary | ICD-10-CM | POA: Diagnosis not present

## 2020-05-31 MED ORDER — SELSUN BLUE DRY SCALP 1 % EX SHAM: MEDICATED_SHAMPOO | Freq: Every day | CUTANEOUS | 12 refills | Status: DC | PRN

## 2020-05-31 MED ORDER — PENICILLIN V POTASSIUM 500 MG PO TABS: 500.0000 mg | ORAL_TABLET | Freq: Four times a day (QID) | ORAL | 0 refills | Status: AC

## 2020-05-31 NOTE — ED Triage Notes (Signed)
C/o dandruff x 3 days and L upper toothache x 2 days.

## 2020-05-31 NOTE — ED Provider Notes (Signed)
[facility] EMERGENCY DEPARTMENT Provider Note   CSN: [id number] Arrival date & time: 05/31/20  1125     History Chief Complaint  Patient presents with  . dandruff  . Dental Pain    Deborah Lawson is a 30 y.o. female.  30 year old female presents with flakes consistent with dandruff along with left-sided jaw pain.  Patient had a jaw pain times several days but denies any fever or chills.  Pain is characterizes sharp and worse in mastication.  Has had the dandruff for quite some time and has not used any treatment for this.  Patient speaks limited English and therefore interpreter was used        Past Medical History:  Diagnosis Date  . Medical history non-contributory     Patient Active Problem List   Diagnosis Date Noted  . Breech presentation of fetus 10/26/2019  . Language barrier 10/12/2019  . Malpresentation before onset of labor 10/12/2019    Past Surgical History:  Procedure Laterality Date  . CESAREAN SECTION N/A 10/26/2019   Procedure: CESAREAN SECTION;  Surgeon: Donnamae Jude, MD;  Location: [facility] LD ORS;  Service: Obstetrics;  Laterality: N/A;  . MANDIBLE SURGERY       OB History    Gravida  3   Para  3   Term  3   Preterm  0   AB  0   Living  3     SAB  0   IAB  0   Ectopic  0   Multiple  0   Live Births  3           Family History  Problem Relation Age of Onset  . Hypertension Father     Social History   Tobacco Use  . Smoking status: Never Smoker  . Smokeless tobacco: Never Used  Vaping Use  . Vaping Use: Never used  Substance Use Topics  . Alcohol use: Never  . Drug use: Never    Home Medications Prior to Admission medications   Medication Sig Start Date End Date Taking? Authorizing Provider  Blood Pressure Monitoring (BLOOD PRESSURE KIT) DEVI 1 Device by Does not apply route as needed. 11/08/19   Griffin Basil, MD  ibuprofen (ADVIL) 600 MG tablet Take 1 tablet (600 mg total) by mouth every 6  (six) hours as needed for headache. 04/23/20   Blanchie Dessert, MD  norethindrone (MICRONOR) 0.35 MG tablet Take 1 tablet by mouth daily. 12/07/19   [provider]  NIFEdipine (PROCARDIA XL) 30 MG 24 hr tablet Take 1 tablet (30 mg total) by mouth daily. 11/05/19 02/18/20  Julianne Handler, CNM    Allergies    Patient has no known allergies.  Review of Systems   Review of Systems  All other systems reviewed and are negative.   Physical Exam Updated Vital Signs BP 123/83 (BP Location: Left Arm)   Pulse 90   Temp 98.6 F (37 C) (Oral)   Resp 16   SpO2 100%   Physical Exam Vitals and nursing note reviewed.  Constitutional:      General: She is not in acute distress.    Appearance: Normal appearance. She is well-developed. She is not toxic-appearing.  HENT:     Head: Normocephalic and atraumatic.     Mouth/Throat:      Comments: Slight pain at left TMJ Eyes:     General: Lids are normal.     Conjunctiva/sclera: Conjunctivae normal.     Pupils: Pupils  are equal, round, and reactive to light.  Neck:     Thyroid: No thyroid mass.     Trachea: No tracheal deviation.  Cardiovascular:     Rate and Rhythm: Normal rate and regular rhythm.     Heart sounds: Normal heart sounds. No murmur heard. No gallop.   Pulmonary:     Effort: Pulmonary effort is normal. No respiratory distress.     Breath sounds: Normal breath sounds. No stridor. No decreased breath sounds, wheezing, rhonchi or rales.  Abdominal:     General: Bowel sounds are normal. There is no distension.     Palpations: Abdomen is soft.     Tenderness: There is no abdominal tenderness. There is no rebound.  Musculoskeletal:        General: No tenderness. Normal range of motion.     Cervical back: Normal range of motion and neck supple.  Skin:    General: Skin is warm and dry.     Findings: No abrasion or rash.     Comments: Dry skin on scalp noted  Neurological:     Mental Status: She is alert and oriented to  person, place, and time.     GCS: GCS eye subscore is 4. GCS verbal subscore is 5. GCS motor subscore is 6.     Cranial Nerves: No cranial nerve deficit.     Sensory: No sensory deficit.  Psychiatric:        Speech: Speech normal.        Behavior: Behavior normal.     ED Results / Procedures / Treatments   Labs (all labs ordered are listed, but only abnormal results are displayed) Labs Reviewed - No data to display  EKG None  Radiology No results found.  Procedures Procedures   Medications Ordered in ED Medications - No data to display  ED Course  I have reviewed the triage vital signs and the nursing notes.  Pertinent labs & imaging results that were available during my care of the patient were reviewed by me and considered in my medical decision making (see chart for details).    MDM Rules/Calculators/A&P                          Patient be placed on prednisone as well as given Motrin.  She was instructed to use an antidandruff shampoo Final Clinical Impression(s) / ED Diagnoses Final diagnoses:  None    Rx / DC Orders ED Discharge Orders    None       Lacretia Leigh, MD 05/31/20 1537

## 2020-06-09 ENCOUNTER — Emergency Department (HOSPITAL_COMMUNITY)
Admission: EM | Admit: 2020-06-09 | Discharge: 2020-06-10 | Disposition: A | Discharge status: Home / Self Care | Payer: Medicaid Other | Attending: Emergency Medicine | Admitting: Emergency Medicine

## 2020-06-09 ENCOUNTER — Encounter (HOSPITAL_COMMUNITY): Payer: Self-pay | Admitting: Emergency Medicine

## 2020-06-09 ENCOUNTER — Other Ambulatory Visit: Payer: Self-pay

## 2020-06-09 DIAGNOSIS — R1031 Right lower quadrant pain: Secondary | ICD-10-CM | POA: Diagnosis not present

## 2020-06-09 DIAGNOSIS — R1032 Left lower quadrant pain: Secondary | ICD-10-CM | POA: Diagnosis not present

## 2020-06-09 DIAGNOSIS — R1030 Lower abdominal pain, unspecified: Secondary | ICD-10-CM

## 2020-06-09 DIAGNOSIS — N3000 Acute cystitis without hematuria: Secondary | ICD-10-CM

## 2020-06-09 LAB — CBC
HCT: 39 % (ref 36.0–46.0)
Hemoglobin: 12.4 g/dL (ref 12.0–15.0)
MCH: 25.5 pg — ABNORMAL LOW (ref 26.0–34.0)
MCHC: 31.8 g/dL (ref 30.0–36.0)
MCV: 80.1 fL (ref 80.0–100.0)
Platelets: 470 10*3/uL — ABNORMAL HIGH (ref 150–400)
RBC: 4.87 MIL/uL (ref 3.87–5.11)
RDW: 15.2 % (ref 11.5–15.5)
WBC: 9.7 10*3/uL (ref 4.0–10.5)
nRBC: 0 % (ref 0.0–0.2)

## 2020-06-09 LAB — COMPREHENSIVE METABOLIC PANEL
ALT: 44 U/L (ref 0–44)
AST: 36 U/L (ref 15–41)
Albumin: 3.5 g/dL (ref 3.5–5.0)
Alkaline Phosphatase: 87 U/L (ref 38–126)
Anion gap: 7 (ref 5–15)
BUN: 15 mg/dL (ref 6–20)
CO2: 22 mmol/L (ref 22–32)
Calcium: 9.2 mg/dL (ref 8.9–10.3)
Chloride: 107 mmol/L (ref 98–111)
Creatinine, Ser: 0.76 mg/dL (ref 0.44–1.00)
GFR, Estimated: >60 mL/min (ref >60)
Glucose, Bld: 120 mg/dL — ABNORMAL HIGH (ref 70–99)
Potassium: 3.8 mmol/L (ref 3.5–5.1)
Sodium: 136 mmol/L (ref 135–145)
Total Bilirubin: 0.3 mg/dL (ref 0.3–1.2)
Total Protein: 7.3 g/dL (ref 6.5–8.1)

## 2020-06-09 LAB — I-STAT BETA HCG BLOOD, ED (MC, WL, AP ONLY): I-stat hCG, quantitative: <5 m[IU]/mL (ref <5)

## 2020-06-09 LAB — URINALYSIS, ROUTINE W REFLEX MICROSCOPIC
Bilirubin Urine: NEGATIVE
Glucose, UA: NEGATIVE mg/dL
Ketones, ur: NEGATIVE mg/dL
Nitrite: NEGATIVE
Protein, ur: NEGATIVE mg/dL
Specific Gravity, Urine: 1.018 (ref 1.005–1.030)
Squamous Epithelial / HPF: >50 — ABNORMAL HIGH (ref 0–5)
WBC, UA: >50 WBC/hpf — ABNORMAL HIGH (ref 0–5)
pH: 5 (ref 5.0–8.0)

## 2020-06-09 LAB — LIPASE, BLOOD: Lipase: 34 U/L (ref 11–51)

## 2020-06-09 NOTE — ED Triage Notes (Signed)
Patient reports chronic intermittent RLQ abdominal pain for several months , no emesis or diarrhea , no fever or chills , patient added mild headache .

## 2020-06-10 ENCOUNTER — Emergency Department (HOSPITAL_COMMUNITY): Payer: Medicaid Other

## 2020-06-10 IMAGING — CT CT ABD-PELV W/ CM
4 series · 13 of 46 positions shown, 18 images · IV contrast (APPLIED)
Comparison: [DATE]

CLINICAL DATA: Abdominal abscess

EXAM:
CT ABDOMEN AND PELVIS WITH CONTRAST
TECHNIQUE: Multidetector CT imaging of the abdomen and pelvis was performed
using the standard protocol following bolus administration of
intravenous contrast.
CONTRAST:  100mL OMNIPAQUE IOHEXOL 300 MG/ML  SOLN

[Series 3: abdomen 5.0 · axial · 0.88mm/px · z∈[+641,+976]mm · 7 of 91 slices shown, 12 images]
[im 12/91  soft-tissue]
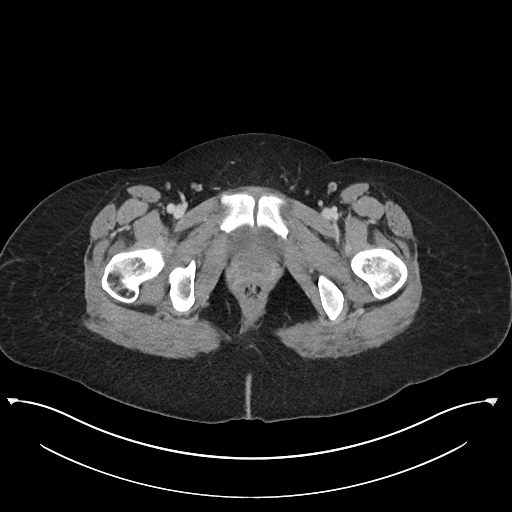
[im 12/91  bone]
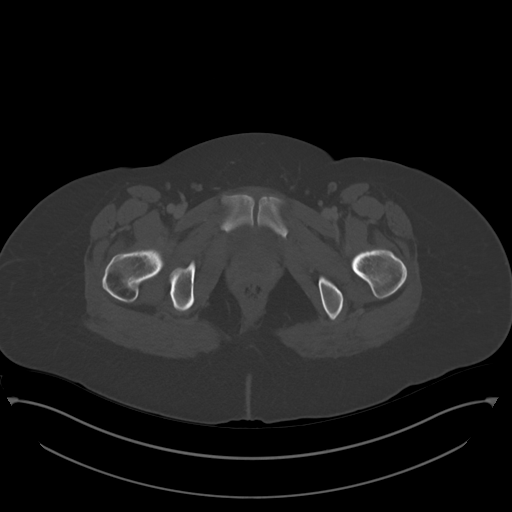
[im 23/91  soft-tissue]
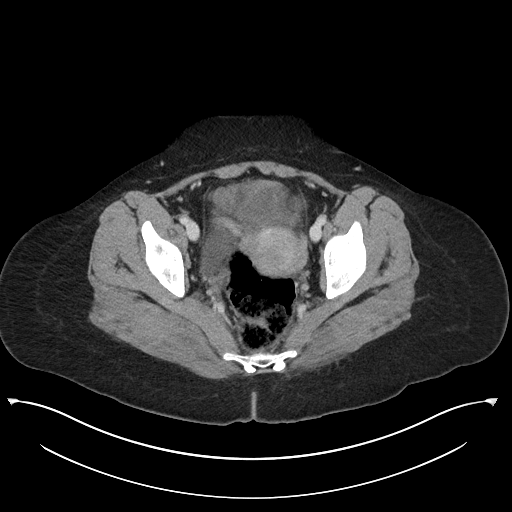
[im 34/91  soft-tissue]
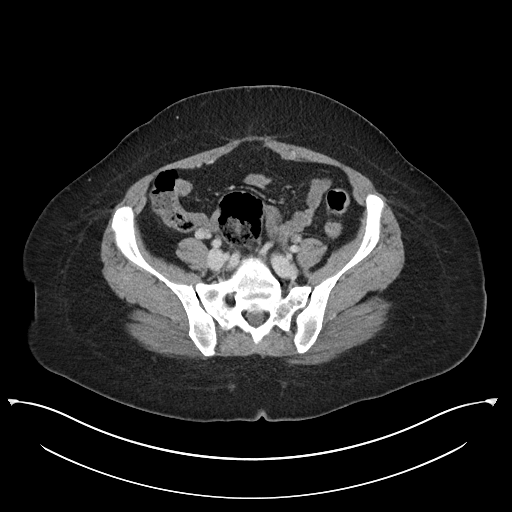
[im 46/91  soft-tissue]
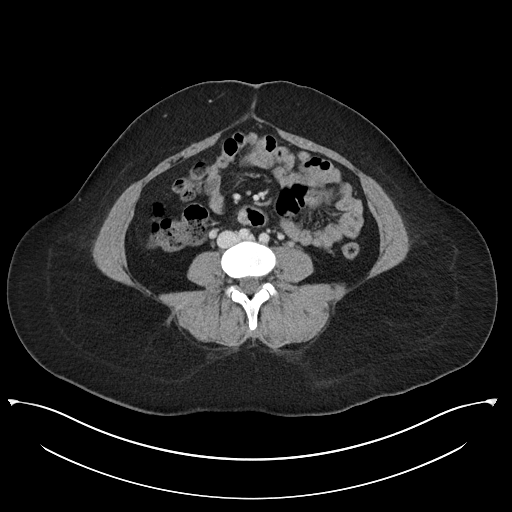
[im 46/91  lung]
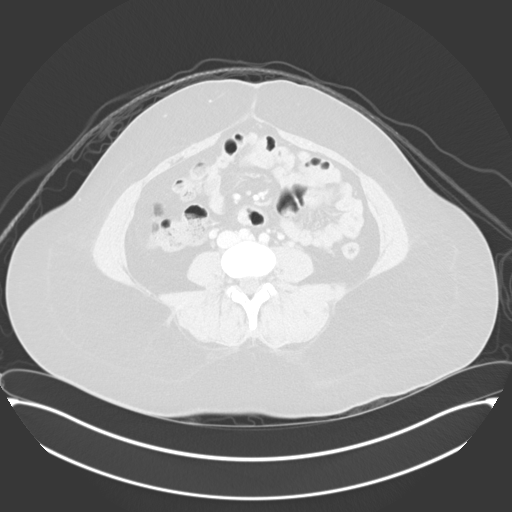
[im 57/91  soft-tissue]
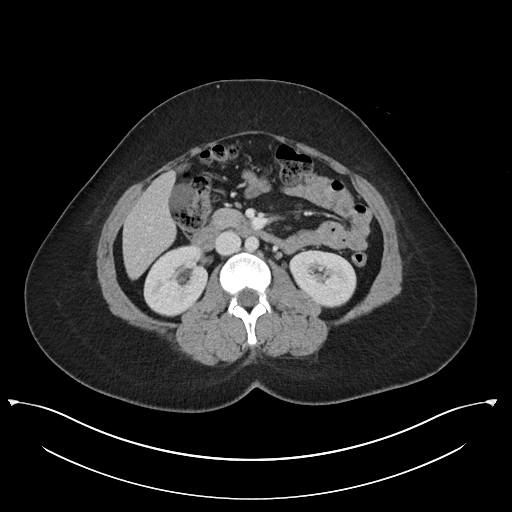
[im 57/91  lung]
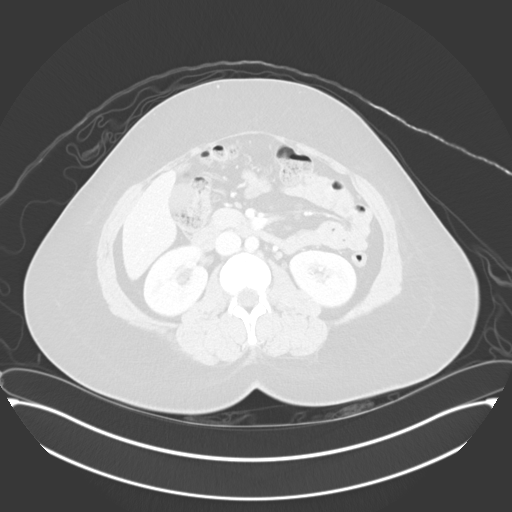
[im 68/91  soft-tissue]
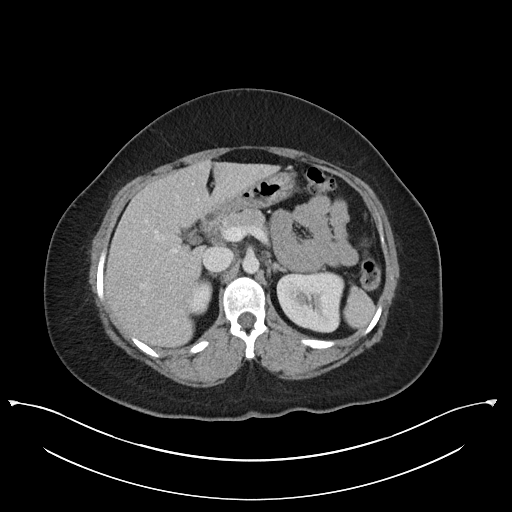
[im 68/91  lung]
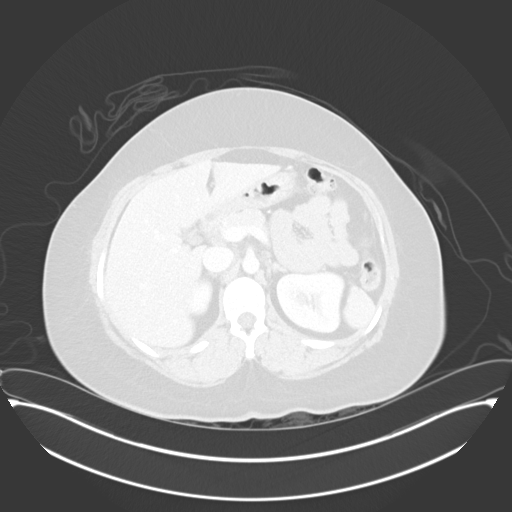
[im 79/91  soft-tissue]
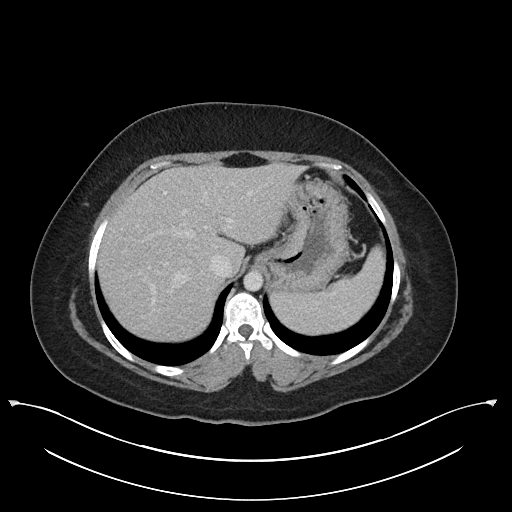
[im 79/91  lung]
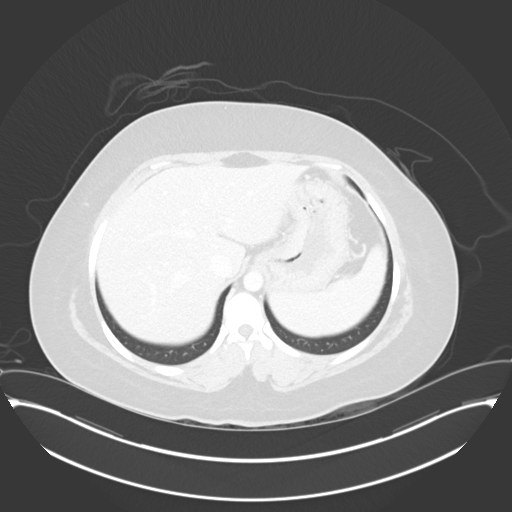

[Series 5: lung · axial · 0.88mm/px · z∈[+626,+666]mm · 2 of 226 slices shown]
[im 21/226  bone]
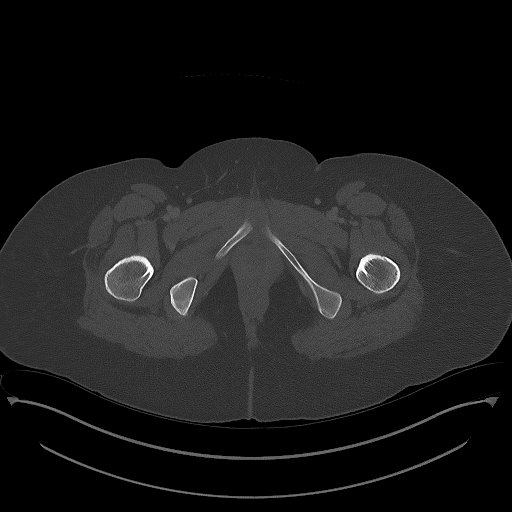
[im 41/226  bone]
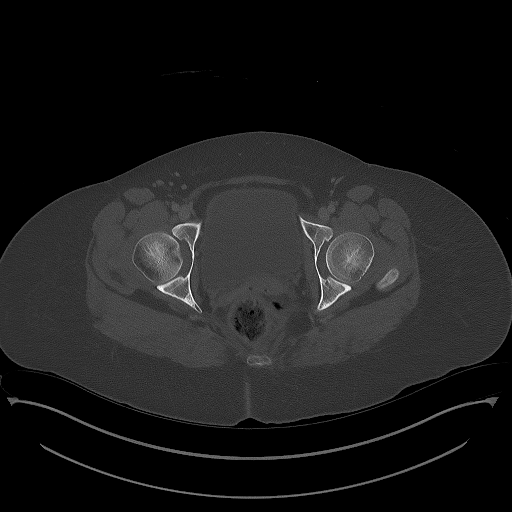

[Series 6: abdomen 3.0 mpr cor · coronal · 0.84mm/px · 3 of 102 slices shown]
[im 34/102  soft-tissue]
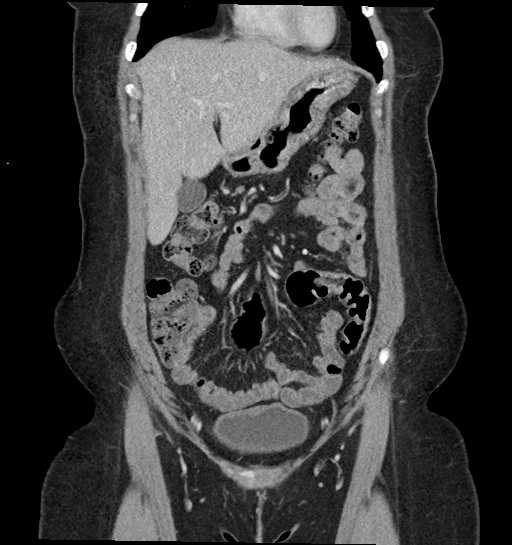
[im 45/102  soft-tissue]
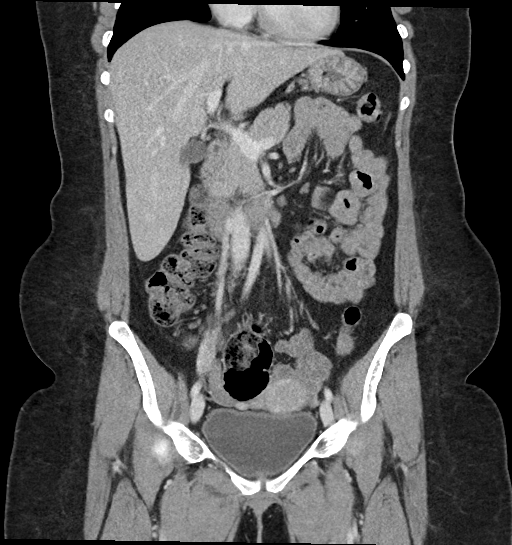
[im 57/102  soft-tissue]
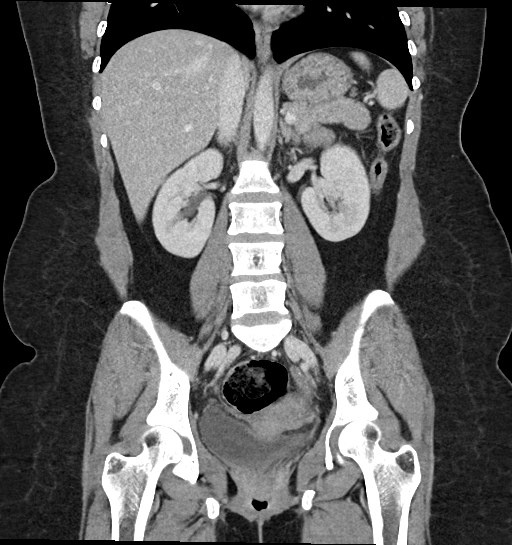

[Series 7: abdomen 3.0 mpr sag · sagittal · 0.59mm/px · 1 of 144 slices shown]
[im 48/144  soft-tissue]
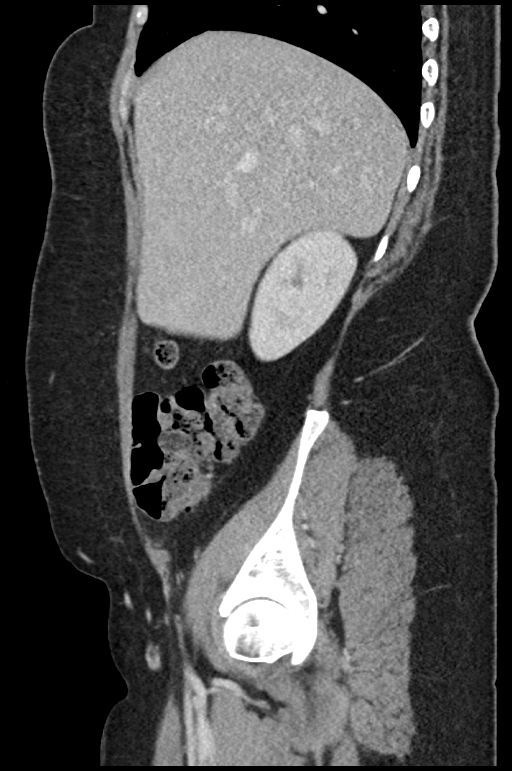

[13 of 46 positions shown; findings below may reference images not displayed]

FINDINGS: Lower chest: The visualized lung bases are clear bilaterally. The
visualized heart and pericardium are unremarkable.

Hepatobiliary: Cholelithiasis without pericholecystic inflammatory
change. No intra or extrahepatic biliary ductal dilation. Liver
unremarkable.

Pancreas: Unremarkable

Spleen: Unremarkable

Adrenals/Urinary Tract: Adrenal glands are unremarkable. Kidneys are
normal, without renal calculi, focal lesion, or hydronephrosis.
Bladder is unremarkable.

Stomach/Bowel: Stomach is within normal limits. Appendix appears
normal. No evidence of bowel wall thickening, distention, or
inflammatory changes. No free intraperitoneal gas or fluid.

Vascular/Lymphatic: There is marked mass effect upon the left common
iliac vein by the crossing right common iliac artery. The right
gonadal vein appears dilated suggesting changes of ovarian vein
reflux and pelvic venous insufficiency in the appropriate clinical
setting. The abdominal vasculature is otherwise unremarkable.

Reproductive: Uterus and bilateral adnexa are unremarkable.

Other: Tiny broad-based fat containing umbilical hernia. Rectum
unremarkable.

Musculoskeletal: No acute bone abnormality. No lytic or blastic bone
lesions seen.
IMPRESSION: No acute intra-abdominal pathology identified.

Cholelithiasis.

Dilation of the right gonadal vein possibly reflecting changes
ovarian vein reflux and pelvic venous insufficiency in the
appropriate clinical setting.

## 2020-06-10 MED ORDER — SODIUM CHLORIDE 0.9 % IV BOLUS
1000.0000 mL | Freq: Once | INTRAVENOUS | Status: AC
  Administered 2020-06-10: 1000 mL via INTRAVENOUS

## 2020-06-10 MED ORDER — CEPHALEXIN 500 MG PO CAPS: 500.0000 mg | ORAL_CAPSULE | Freq: Three times a day (TID) | ORAL | 0 refills | Status: DC

## 2020-06-10 MED ORDER — IOHEXOL 300 MG/ML  SOLN
100.0000 mL | Freq: Once | INTRAMUSCULAR | Status: AC | PRN
  Administered 2020-06-10: 100 mL via INTRAVENOUS

## 2020-06-10 NOTE — ED Provider Notes (Signed)
[facility] EMERGENCY DEPARTMENT Provider Note   CSN: [id number] Arrival date & time: 06/09/20  2006     History Chief Complaint  Patient presents with  . Abdominal Pain    Deborah Lawson is a 30 y.o. female.  Patient is a 30 year old female with history of prior C-section 7 months ago and ovarian cyst.  Patient presents today for evaluation of lower abdominal pain.  This has been present for the past several days.  She tells me she was diagnosed with ovarian cysts and this feels similar.  She denies any fevers or chills.  She denies any bowel or bladder complaints.  Patient denies abnormal menstrual periods or irregular bleeding.  Of note is that patient speaks minimal [place], mainly Arabic.  History was taken with the assistance of the translator tablet.  The history is provided by the patient.  Abdominal Pain Pain location:  LLQ and RLQ Pain quality: cramping   Pain radiates to:  Does not radiate Pain severity:  Moderate Onset quality:  Gradual Timing:  Constant Progression:  Worsening Chronicity:  Recurrent Relieved by:  Nothing Worsened by:  Movement and palpation      Past Medical History:  Diagnosis Date  . Medical history non-contributory     Patient Active Problem List   Diagnosis Date Noted  . Breech presentation of fetus 10/26/2019  . Language barrier 10/12/2019  . Malpresentation before onset of labor 10/12/2019    Past Surgical History:  Procedure Laterality Date  . CESAREAN SECTION N/A 10/26/2019   Procedure: CESAREAN SECTION;  Surgeon: Donnamae Jude, MD;  Location: [facility];  Service: Obstetrics;  Laterality: N/A;  . MANDIBLE SURGERY       OB History    Gravida  3   Para  3   Term  3   Preterm  0   AB  0   Living  3     SAB  0   IAB  0   Ectopic  0   Multiple  0   Live Births  3           Family History  Problem Relation Age of Onset  . Hypertension Father     Social History   Tobacco Use  .  Smoking status: Never Smoker  . Smokeless tobacco: Never Used  Vaping Use  . Vaping Use: Never used  Substance Use Topics  . Alcohol use: Never  . Drug use: Never    Home Medications Prior to Admission medications   Medication Sig Start Date End Date Taking? Authorizing Provider  Blood Pressure Monitoring (BLOOD PRESSURE KIT) DEVI 1 Device by Does not apply route as needed. 11/08/19   Griffin Basil, MD  ibuprofen (ADVIL) 600 MG tablet Take 1 tablet (600 mg total) by mouth every 6 (six) hours as needed for headache. 04/23/20   Blanchie Dessert, MD  norethindrone (MICRONOR) 0.35 MG tablet Take 1 tablet by mouth daily. 12/07/19   [provider]  pyrithione zinc (SELSUN BLUE DRY SCALP) 1 % shampoo Apply topically daily as needed for itching. 05/31/20   Lacretia Leigh, MD  NIFEdipine (PROCARDIA XL) 30 MG 24 hr tablet Take 1 tablet (30 mg total) by mouth daily. 11/05/19 02/18/20  Julianne Handler, CNM    Allergies    Patient has no known allergies.  Review of Systems   Review of Systems  Gastrointestinal: Positive for abdominal pain.  All other systems reviewed and are negative.   Physical Exam Updated  Vital Signs BP 132/85 (BP Location: Right Arm)   Pulse 89   Temp 98.3 F (36.8 C) (Oral)   Resp 16   SpO2 100%   Physical Exam Vitals and nursing note reviewed.  Constitutional:      General: She is not in acute distress.    Appearance: She is well-developed. She is not diaphoretic.  HENT:     Head: Normocephalic and atraumatic.  Cardiovascular:     Rate and Rhythm: Normal rate and regular rhythm.     Heart sounds: No murmur heard. No friction rub. No gallop.   Pulmonary:     Effort: Pulmonary effort is normal. No respiratory distress.     Breath sounds: Normal breath sounds. No wheezing.  Abdominal:     General: Bowel sounds are normal. There is no distension.     Palpations: Abdomen is soft.     Tenderness: There is abdominal tenderness in the right lower  quadrant, suprapubic area and left lower quadrant. There is no right CVA tenderness, left CVA tenderness, guarding or rebound.  Musculoskeletal:        General: Normal range of motion.     Cervical back: Normal range of motion and neck supple.  Skin:    General: Skin is warm and dry.  Neurological:     Mental Status: She is alert and oriented to person, place, and time.     ED Results / Procedures / Treatments   Labs (all labs ordered are listed, but only abnormal results are displayed) Labs Reviewed  COMPREHENSIVE METABOLIC PANEL - Abnormal; Notable for the following components:      Result Value   Glucose, Bld 120 (*)    All other components within normal limits  CBC - Abnormal; Notable for the following components:   MCH 25.5 (*)    Platelets 470 (*)    All other components within normal limits  URINALYSIS, ROUTINE W REFLEX MICROSCOPIC - Abnormal; Notable for the following components:   APPearance CLOUDY (*)    Hgb urine dipstick SMALL (*)    Leukocytes,Ua MODERATE (*)    WBC, UA >50 (*)    Bacteria, UA FEW (*)    Squamous Epithelial / LPF >50 (*)    All other components within normal limits  LIPASE, BLOOD  I-STAT BETA HCG BLOOD, ED ([facility], [facility], [facility] ONLY)    EKG None  Radiology No results found.  Procedures Procedures   Medications Ordered in ED Medications  sodium chloride 0.9 % bolus 1,000 mL (has no administration in time range)    ED Course  I have reviewed the triage vital signs and the nursing notes.  Pertinent labs & imaging results that were available during my care of the patient were reviewed by me and considered in my medical decision making (see chart for details).    MDM Rules/Calculators/A&P  Patient is a 29 year old female presenting with complaints of lower abdominal pain, the etiology of which appears to be a UTI.  Patient's laboratory studies are reassuring otherwise and CT of the abdomen and pelvis reveals no acute abnormality.  Patient to  be discharged with Keflex, is to take ibuprofen as needed for pain, and follow-up with primary doctor if not improving.  Final Clinical Impression(s) / ED Diagnoses Final diagnoses:  None    Rx / DC Orders ED Discharge Orders    None       Veryl Speak, MD 06/10/20 [phone]

## 2020-06-10 NOTE — Discharge Instructions (Addendum)
Begin taking Keflex as prescribed.  Take ibuprofen 600 mg every 6 hours as needed for pain.  Follow-up with primary doctor if symptoms or not improving in the next 3 to 4 days, and return to the ER if symptoms significantly worsen or change.

## 2020-06-10 NOTE — ED Notes (Signed)
Pt to CT, staff keeping baby

## 2020-06-25 NOTE — Progress Notes (Signed)
NEUROLOGY CONSULTATION NOTE  Deborah Lawson MRN: [id number] DOB: November 14, 1990  Referring provider: Blanchie Dessert, MD (ED referral) Primary care provider: No PCP  Reason for consult:  migraine  Assessment/Plan:   1.  Atypical left-sided facial pain - unclear if atypical trigeminal neuralgia, TMJ dysfunction or dental etiology.  1.  Check MRI of brain and left trigeminal nerve with and without contrast 2.  Start gabapentin 150m titrating to 306mat bedtime.  We can increase dose if needed. 3.  Follow up 4 months   Subjective:  Deborah Lawson a [age]ear old female who presents for migraines.  History supplemented by ED note.  She is accompanied by Arabic speaking interpreter.  She started having a headache around January  No prior history of headache.  It is a moderate to severe left sided pressure/aching pain involving the left temple, eye, ear and face, including along the jaw and behind TMJ.  It comes and goes during the day the pain lessens and resolves by next day.  They occurs when she feels stressed or depressed, about 3 to 4 days a week.  No associated nausea, vomiting, photophobia, phonophobia, visual disturbance, autonomic symptoms, numbness or weakness.  She went to the ED where she was diagnosed with a cavity.  She had the tooth pulled but pain persisted.  She was seen in the ED on 04/23/2020 where CT head personally reviewed was unremarkable and she was treated with headache cocktail.  She returned to the ED on 05/31/2020 for left sided TMJ pain where she was prescribed a prednisone taper and instructed to take Motrin as needed.      Patient is currently breastfeeding.  Current NSAIDS/analgesics:  ibuprofen Current triptans:  none Current ergotamine:  none Current anti-emetic:  none Current muscle relaxants:  none Current Antihypertensive medications:  none Current Antidepressant medications:  none Current Anticonvulsant medications:  none Current anti-CGRP:  none Current  Vitamins/Herbal/Supplements:  none Current Antihistamines/Decongestants:  none Other therapy:  none Hormone/birth control:  norethindrone  Past NSAIDS/analgesics:  none Past abortive triptans:  none Past abortive ergotamine:  none Past muscle relaxants:  none Past anti-emetic:  none Past antihypertensive medications:  nifedipine Past antidepressant medications:  none Past anticonvulsant medications:  none Past anti-CGRP:  none Past vitamins/Herbal/Supplements:  none Past antihistamines/decongestants:  none Other past therapies:  none   PAST MEDICAL HISTORY: Past Medical History:  Diagnosis Date  . Medical history non-contributory     PAST SURGICAL HISTORY: Past Surgical History:  Procedure Laterality Date  . CESAREAN SECTION N/A 10/26/2019   Procedure: CESAREAN SECTION;  Surgeon: PrDonnamae JudeMD;  Location: [facility] LD [facility]S;  Service: Obstetrics;  Laterality: N/A;  . MANDIBLE SURGERY      MEDICATIONS: Current Outpatient Medications on File Prior to Visit  Medication Sig Dispense Refill  . Blood Pressure Monitoring (BLOOD PRESSURE KIT) DEVI 1 Device by Does not apply route as needed. 1 each 0  . cephALEXin (KEFLEX) 500 MG capsule Take 1 capsule (500 mg total) by mouth 3 (three) times daily. 15 capsule 0  . ibuprofen (ADVIL) 600 MG tablet Take 1 tablet (600 mg total) by mouth every 6 (six) hours as needed for headache. 30 tablet 0  . norethindrone (MICRONOR) 0.35 MG tablet Take 1 tablet by mouth daily.    . Marland Kitchenyrithione zinc (SELSUN BLUE DRY SCALP) 1 % shampoo Apply topically daily as needed for itching. 400 mL 12  . [DISCONTINUED] NIFEdipine (PROCARDIA XL) 30 MG 24 hr tablet Take 1 tablet (  30 mg total) by mouth daily. 30 tablet 1   No current facility-administered medications on file prior to visit.    ALLERGIES: No Known Allergies  FAMILY HISTORY: Family History  Problem Relation Age of Onset  . Hypertension Father     Objective:  Blood pressure 119/90, pulse 88,  height 5' 2" (1.575 m), weight 205 lb 3.2 oz (93.1 kg), SpO2 100 %, currently breastfeeding. General: No acute distress.  Patient appears well-groomed.   Head:  Normocephalic/atraumatic Eyes:  fundi examined but not visualized Neck: supple, no paraspinal tenderness, full range of motion Back: No paraspinal tenderness Heart: regular rate and rhythm Lungs: Clear to auscultation bilaterally. Vascular: No carotid bruits. Neurological Exam: Mental status: alert and oriented to person, place, and time, recent and remote memory intact, fund of knowledge intact, attention and concentration intact, speech fluent and not dysarthric, language intact. Cranial nerves: CN I: not tested CN II: pupils equal, round and reactive to light, visual fields intact CN III, IV, VI:  full range of motion, no nystagmus, no ptosis CN V: reports altered/decreased sensation to light touch left V1-V3 CN VII: upper and lower face symmetric CN VIII: hearing intact CN IX, X: gag intact, uvula midline CN XI: sternocleidomastoid and trapezius muscles intact CN XII: tongue midline Bulk & Tone: normal, no fasciculations. Motor:  muscle strength 5/5 throughout Sensation:  Pinprick, temperature and vibratory sensation intact. Deep Tendon Reflexes:  2+ throughout,  toes downgoing.   Finger to nose testing:  Without dysmetria.   Heel to shin:  Without dysmetria.   Gait:  Normal station and stride.  Romberg negative.    Thank you for allowing me to take part in the care of this patient.  Adam Jaffe, DO      

## 2020-06-26 ENCOUNTER — Encounter: Payer: Self-pay | Admitting: Neurology

## 2020-06-26 ENCOUNTER — Ambulatory Visit: Payer: Medicaid Other | Admitting: Neurology

## 2020-06-26 ENCOUNTER — Other Ambulatory Visit: Payer: Self-pay

## 2020-06-26 VITALS — BP 119/90 | HR 88 | Ht 62.0 in | Wt 205.2 lb

## 2020-06-26 DIAGNOSIS — G501 Atypical facial pain: Secondary | ICD-10-CM | POA: Diagnosis not present

## 2020-06-26 DIAGNOSIS — G35 Multiple sclerosis: Secondary | ICD-10-CM

## 2020-06-26 MED ORDER — GABAPENTIN 100 MG PO CAPS: ORAL_CAPSULE | ORAL | 0 refills | Status: DC

## 2020-06-26 NOTE — Addendum Note (Signed)
Addended by: Leida Lauth on: 06/26/2020 10:29 AM   Modules accepted: Orders

## 2020-06-26 NOTE — Patient Instructions (Signed)
1.  Start gabapentin 100mg .  Take 1 pill at bedtime for one week, then 2 pills at bedtime for one week, then 3 pills at bedtime. If no improvement by refill, contact me and I will increase dose 2.  MRI of brain and left trigeminal nerve with and without contrast 3.  Follow up 4 months.

## 2020-07-08 ENCOUNTER — Telehealth: Payer: Self-pay | Admitting: Neurology

## 2020-07-08 DIAGNOSIS — E236 Other disorders of pituitary gland: Secondary | ICD-10-CM

## 2020-07-08 NOTE — Telephone Encounter (Signed)
MRI of brain w/wo contrast reviewed - overall unremarkable.  No abnormalities concerning that would cause the facial pain.  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  This is benign but she may want to bring attention to her PCP to see if further workup is needed.

## 2020-07-08 NOTE — Telephone Encounter (Signed)
Patient would like a call back about her test results

## 2020-07-08 NOTE — Telephone Encounter (Signed)
Pt advised of her MRI results. Pt called with an Interpreter on the line.   Pt wants to be seen by [facility] to discuss the inciendal  Finding. Pt doesn't have a PCP

## 2020-07-08 NOTE — Telephone Encounter (Signed)
Yes, we can refer her to [facility]

## 2020-07-08 NOTE — Telephone Encounter (Signed)
Results not back, patient called.

## 2020-07-09 ENCOUNTER — Telehealth: Payer: Self-pay

## 2020-07-09 NOTE — Telephone Encounter (Signed)
[facility] numbers given to pt to see if any other providers are seeing new patient.      Nature conservation officer at [employer] [address] . [place], [place] Main Line: [phone] . Sports Medicine: [phone] . Fax: [phone] . Hours (M-F): 8 am - 5 pm Primary Care, Behavioral Medicine  Map it [facility] [address] . [place], [place]: [phone] . Behavioral Medicine: [phone] . Fax: [phone] . Hours: 7am - 5pm (M, Th, F); 7am - 6pm (Tue & Wed)

## 2020-07-10 NOTE — Telephone Encounter (Signed)
Patient called to check on the results.  Also, she'd like a referral to an endocrinologist as well to be sure she is alright.

## 2020-07-10 NOTE — Telephone Encounter (Signed)
Refer to endocrinology regarding pituitary cyst

## 2020-07-10 NOTE — Telephone Encounter (Signed)
Referral made. Pt advised.

## 2020-08-05 ENCOUNTER — Ambulatory Visit (INDEPENDENT_AMBULATORY_CARE_PROVIDER_SITE_OTHER): Payer: Medicaid Other | Admitting: Internal Medicine

## 2020-08-05 ENCOUNTER — Other Ambulatory Visit: Payer: Self-pay

## 2020-08-05 ENCOUNTER — Encounter: Payer: Self-pay | Admitting: Internal Medicine

## 2020-08-05 VITALS — BP 136/76 | HR 106 | Ht 62.0 in | Wt 198.4 lb

## 2020-08-05 DIAGNOSIS — E236 Other disorders of pituitary gland: Secondary | ICD-10-CM | POA: Diagnosis not present

## 2020-08-05 NOTE — Progress Notes (Signed)
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 is a 30 y.o. female with unremarkable past medical history . The patient presented for initial endocrinology clinic visit on 08/05/2020 for consultative assistance with her pituitary cyst.   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 Historically she has had occasional headaches but these became worse associated with left visual changes .  Her headaches have improved since removing the Nexplanon 5/11th   She is S/P C-section in 10/2019 ,this was her 3rd pregnancy.  She is currently nursing.    She noted left facial pain since being on Nexplanon, palpitations  As well as enlarging breast  Removed nexplanon 07/23/2020  Currently nursing  No menstruations since delivery.   Has lost weight since delivery  No rash or striae     HISTORY:  Past Medical History:  Past Medical History:  Diagnosis Date  . Medical history non-contributory    Past Surgical History:  Past Surgical History:  Procedure Laterality Date  . CESAREAN SECTION N/A 10/26/2019   Procedure: CESAREAN SECTION;  Surgeon: Donnamae Jude, MD;  Location: [facility] LD [facility]S;  Service: Obstetrics;  Laterality: N/A;  . MANDIBLE SURGERY        Social History:  reports that she has never smoked. She has never used smokeless tobacco. She reports that she does not drink alcohol and does not use drugs.  Family History: family history includes Hypertension in her father.   HOME MEDICATIONS: Allergies as of 08/05/2020   No Known Allergies     Medication List       Accurate as of Aug 05, 2020 11:46  AM. If you have any questions, ask your nurse or doctor.        STOP taking these medications   Blood Pressure Kit Devi Stopped by: Dorita Sciara, MD   cephALEXin 500 MG capsule Commonly known as: KEFLEX Stopped by: Dorita Sciara, MD   ibuprofen 600 MG tablet Commonly known as: ADVIL Stopped by: Dorita Sciara, MD   Nexplanon 68 MG Impl implant Generic drug: etonogestrel Stopped by: Dorita Sciara, MD     TAKE these medications   gabapentin 100 MG capsule Commonly known as: Neurontin Take 1 capsule at bedtime for one week, then 2 capsules at bedtime for one week, then 3 capsules at bedtime   norethindrone 0.35 MG tablet Commonly known as: MICRONOR Take 1 tablet by mouth daily.   Selsun Blue Dry Scalp 1 % shampoo Generic drug: pyrithione zinc Apply topically daily as needed for itching.         REVIEW OF SYSTEMS: A comprehensive ROS was conducted with the patient and is negative except as per HPI    OBJECTIVE:  VS: BP 136/76   Pulse (!) 106   Ht '5\' 2"'  (1.575 m)   Wt 198 lb 6 oz (90 kg)   SpO2 98%   BMI 36.28 kg/m    Wt Readings from Last 3 Encounters:  08/05/20 198 lb 6 oz (90 kg)  06/26/20 205 lb 3.2 oz (93.1 kg)  11/08/19 187 lb 14.4 oz (85.2 kg)     EXAM: General: Pt appears well and is in NAD  Eyes: External eye exam normal without stare, lid lag or exophthalmos.  EOM intact.   Neck: General: Supple without adenopathy. Thyroid: Thyroid size normal.  No goiter or nodules appreciated.  Lungs: Clear with good BS bilat with no rales, rhonchi, or wheezes  Heart: Auscultation: RRR.  Abdomen: Normoactive bowel sounds, soft, nontender, without masses or organomegaly palpable  Extremities:  BL LE: No pretibial edema normal ROM and strength.  Skin: Hair: Texture and amount normal with gender appropriate distribution Skin Inspection: No rashes, acanthosis nigricans/skin tags.  Skin Palpation: Skin temperature, texture, and  thickness normal to palpation  Neuro: Cranial nerves: II - XII grossly intact  Motor: Normal strength throughout DTRs: 2+ and symmetric in UE without delay in relaxation phase  Mental Status: Judgment, insight: Intact Orientation: Oriented to time, place, and person Mood and affect: No depression, anxiety, or agitation     DATA REVIEWED: Results for SHANNYN, JANKOWIAK (MRN [id number]) as of 08/05/2020 13:44  Ref. Range 06/09/2020 20:39  Sodium Latest Ref Range: 135 - 145 mmol/L 136  Potassium Latest Ref Range: 3.5 - 5.1 mmol/L 3.8  Chloride Latest Ref Range: 98 - 111 mmol/L 107  CO2 Latest Ref Range: 22 - 32 mmol/L 22  Glucose Latest Ref Range: 70 - 99 mg/dL 120 (H)  BUN Latest Ref Range: 6 - 20 mg/dL 15  Creatinine Latest Ref Range: 0.44 - 1.00 mg/dL 0.76  Calcium Latest Ref Range: 8.9 - 10.3 mg/dL 9.2  Anion gap Latest Ref Range: 5 - 15  7  Alkaline Phosphatase Latest Ref Range: 38 - 126 U/L 87  Albumin Latest Ref Range: 3.5 - 5.0 g/dL 3.5  Lipase Latest Ref Range: 11 - 51 U/L 34  AST Latest Ref Range: 15 - 41 U/L 36  ALT Latest Ref Range: 0 - 44 U/L 44  Total Protein Latest Ref Range: 6.5 - 8.1 g/dL 7.3  Total Bilirubin Latest Ref Range: 0.3 - 1.2 mg/dL 0.3  GFR, Estimated Latest Ref Range: >60 mL/min >60      ASSESSMENT/PLAN/RECOMMENDATIONS:   1. Pituitary Cyst:  - This was an incidental finding on MRI for evaluation of headaches.  - Reassurance provided at this time  - Will consider repeat imaging in 1 yr  - No clinical concern for hypo or hyperpituitarism but will evaluate biochemically  - Pt will return for fasting 8 AM labs      F/Y in 1 yr or sooner pending lab results   Signed electronically by: Mack Guise, MD  [facility]  [facility] [address] [place], [place] [address] Phone: [phone] FAX: [phone]   CC: Patient, No Pcp Per (Inactive) No address on file Phone: None Fax: None   Return to  Endocrinology clinic as below: Future Appointments  Date Time Provider [place]  08/07/2020  7:45 AM [facility]  08/07/2020  4:00 PM Erline Hau, MD [facility]  12/23/2020 10:50 AM Pieter Partridge, DO [facility] None  08/11/2021  8:10 AM Kamri Gotsch, Melanie Crazier, MD [facility]

## 2020-08-06 DIAGNOSIS — H9202 Otalgia, left ear: Secondary | ICD-10-CM | POA: Insufficient documentation

## 2020-08-06 DIAGNOSIS — M2669 Other specified disorders of temporomandibular joint: Secondary | ICD-10-CM | POA: Insufficient documentation

## 2020-08-06 HISTORY — DX: Otalgia, left ear: H92.02

## 2020-08-06 HISTORY — DX: Other specified disorders of temporomandibular joint: M26.69

## 2020-08-07 ENCOUNTER — Other Ambulatory Visit (INDEPENDENT_AMBULATORY_CARE_PROVIDER_SITE_OTHER): Payer: Medicaid Other

## 2020-08-07 ENCOUNTER — Encounter: Payer: Self-pay | Admitting: Internal Medicine

## 2020-08-07 ENCOUNTER — Other Ambulatory Visit: Payer: Self-pay

## 2020-08-07 ENCOUNTER — Ambulatory Visit (INDEPENDENT_AMBULATORY_CARE_PROVIDER_SITE_OTHER): Payer: Medicaid Other | Admitting: Internal Medicine

## 2020-08-07 DIAGNOSIS — O139 Gestational [pregnancy-induced] hypertension without significant proteinuria, unspecified trimester: Secondary | ICD-10-CM

## 2020-08-07 DIAGNOSIS — O24419 Gestational diabetes mellitus in pregnancy, unspecified control: Secondary | ICD-10-CM | POA: Diagnosis not present

## 2020-08-07 DIAGNOSIS — E236 Other disorders of pituitary gland: Secondary | ICD-10-CM

## 2020-08-07 DIAGNOSIS — Z8759 Personal history of other complications of pregnancy, childbirth and the puerperium: Secondary | ICD-10-CM | POA: Insufficient documentation

## 2020-08-07 DIAGNOSIS — O09299 Supervision of pregnancy with other poor reproductive or obstetric history, unspecified trimester: Secondary | ICD-10-CM | POA: Insufficient documentation

## 2020-08-07 LAB — TSH: TSH: 2.11 u[IU]/mL (ref 0.35–4.50)

## 2020-08-07 LAB — CORTISOL: Cortisol, Plasma: 7.6 ug/dL

## 2020-08-07 LAB — LUTEINIZING HORMONE: LH: 6.62 m[IU]/mL

## 2020-08-07 LAB — FOLLICLE STIMULATING HORMONE: FSH: 9.6 m[IU]/mL

## 2020-08-07 LAB — T4, FREE: Free T4: 0.71 ng/dL (ref 0.60–1.60)

## 2020-08-07 NOTE — Patient Instructions (Signed)
-  Nice seeing you today!!  -Schedule follow up in 3 months for your physical. Please come in fasting that day. 

## 2020-08-07 NOTE — Progress Notes (Signed)
New Patient Office Visit     This visit occurred during the SARS-CoV-2 public health emergency.  Safety protocols were in place, including screening questions prior to the visit, additional usage of staff PPE, and extensive cleaning of exam room while observing appropriate contact time as indicated for disinfecting solutions.    CC/Reason for Visit: Establish care, discuss chronic conditions Previous PCP: None Last Visit: Unknown  HPI: Ameila Lawson is a 30 y.o. female who is coming in today for the above mentioned reasons.  She speaks Arabic and we are aided with a Advertising account executive today.  Her past medical history is nonsignificant other than gestational diabetes and hypertension during her 1 and only pregnancy.  She delivered via C-section in August.  Shortly after delivery she had a birth control implant placed.  She developed significant migraines.  She had to go to the emergency department who referred her to neurology.  She had a birth implant removed about 3 weeks ago with almost immediate cessation of her migraine headaches.  She has not had routine medical care other than during her pregnancy in years.  She has a 56-month-old and is currently breast-feeding.  Past Medical/Surgical History: Past Medical History:  Diagnosis Date  . Gestational diabetes   . Gestational hypertension   . Medical history non-contributory     Past Surgical History:  Procedure Laterality Date  . CESAREAN SECTION N/A 10/26/2019   Procedure: CESAREAN SECTION;  Surgeon: Reva Bores, MD;  Location: [facility];  Service: Obstetrics;  Laterality: N/A;  . MANDIBLE SURGERY      Social History:  reports that she has never smoked. She has never used smokeless tobacco. She reports that she does not drink alcohol and does not use drugs.  Allergies: No Known Allergies  Family History:  Family History  Problem Relation Age of Onset  . Hypertension Father     No current outpatient medications on  file.  Review of Systems:  Constitutional: Denies fever, chills, diaphoresis, appetite change and fatigue.  HEENT: Denies photophobia, eye pain, redness, hearing loss, ear pain, congestion, sore throat, rhinorrhea, sneezing, mouth sores, trouble swallowing, neck pain, neck stiffness and tinnitus.   Respiratory: Denies SOB, DOE, cough, chest tightness,  and wheezing.   Cardiovascular: Denies chest pain, palpitations and leg swelling.  Gastrointestinal: Denies nausea, vomiting, abdominal pain, diarrhea, constipation, blood in stool and abdominal distention.  Genitourinary: Denies dysuria, urgency, frequency, hematuria, flank pain and difficulty urinating.  Endocrine: Denies: hot or cold intolerance, sweats, changes in hair or nails, polyuria, polydipsia. Musculoskeletal: Denies myalgias, back pain, joint swelling, arthralgias and gait problem.  Skin: Denies pallor, rash and wound.  Neurological: Denies dizziness, seizures, syncope, weakness, light-headedness, numbness and headaches.  Hematological: Denies adenopathy. Easy bruising, personal or family bleeding history  Psychiatric/Behavioral: Denies suicidal ideation, mood changes, confusion, nervousness, sleep disturbance and agitation    Physical Exam: Vitals:   08/07/20 1550  BP: 110/70  Pulse: 100  Temp: 98 F (36.7 C)  TempSrc: Oral  SpO2: 99%  Weight: 198 lb 9.6 oz (90.1 kg)  Height: 5\' 2"  (1.575 m)   Body mass index is 36.32 kg/m.  Constitutional: NAD, calm, comfortable Eyes: PERRL, lids and conjunctivae normal ENMT: Mucous membranes are moist.  Respiratory: clear to auscultation bilaterally, no wheezing, no crackles. Normal respiratory effort. No accessory muscle use.  Cardiovascular: Regular rate and rhythm, no murmurs / rubs / gallops. No extremity edema.  Neurologic: Grossly intact and nonfocal Psychiatric: Normal judgment and insight.  Alert and oriented x 3. Normal mood.    Impression and Plan:  Gestational  diabetes mellitus (GDM), antepartum, gestational diabetes method of control unspecified  Gestational hypertension, antepartum  -Migraine headaches have resolved. -Blood pressure is within limits today. -While in the emergency department she had a normal glucose level. -She will return in 3 months for physical.  Time spent: 22 minutes reviewing chart, interviewing and examining patient with the aid of a translator and formulating plan of care.    Patient Instructions  -Nice seeing you today!!  -Schedule follow up in 3 months for your physical. Please come in fasting that day.     Chaya Jan, MD [facility] at [facility]

## 2020-08-08 LAB — ESTRADIOL: Estradiol: 17 pg/mL

## 2020-08-11 ENCOUNTER — Other Ambulatory Visit: Payer: Self-pay | Admitting: Certified Nurse Midwife

## 2020-08-11 LAB — INSULIN-LIKE GROWTH FACTOR
IGF-I, LC/MS: 214 ng/mL (ref 63–373)
Z-Score (Female): 0.6 SD (ref ?–2.0)

## 2020-08-12 LAB — ACTH: C206 ACTH: 29 pg/mL (ref 6–50)

## 2020-08-13 ENCOUNTER — Encounter: Payer: Self-pay | Admitting: Internal Medicine

## 2020-10-08 ENCOUNTER — Encounter: Payer: Self-pay | Admitting: Internal Medicine

## 2020-10-08 ENCOUNTER — Ambulatory Visit (INDEPENDENT_AMBULATORY_CARE_PROVIDER_SITE_OTHER): Payer: Medicaid Other | Admitting: Internal Medicine

## 2020-10-08 ENCOUNTER — Other Ambulatory Visit: Payer: Self-pay

## 2020-10-08 VITALS — BP 120/88 | HR 103 | Temp 98.3°F | Wt 191.7 lb

## 2020-10-08 DIAGNOSIS — N912 Amenorrhea, unspecified: Secondary | ICD-10-CM

## 2020-10-08 DIAGNOSIS — N3001 Acute cystitis with hematuria: Secondary | ICD-10-CM | POA: Diagnosis not present

## 2020-10-08 LAB — POCT URINALYSIS DIPSTICK
Bilirubin, UA: NEGATIVE
Glucose, UA: NEGATIVE
Ketones, UA: NEGATIVE
Nitrite, UA: NEGATIVE
Protein, UA: POSITIVE — AB
Spec Grav, UA: 1.02 (ref 1.010–1.025)
Urobilinogen, UA: 0.2 E.U./dL
pH, UA: 6 (ref 5.0–8.0)

## 2020-10-08 LAB — POCT URINE PREGNANCY: Preg Test, Ur: NEGATIVE

## 2020-10-08 MED ORDER — SULFAMETHOXAZOLE-TRIMETHOPRIM 800-160 MG PO TABS: 1.0000 | ORAL_TABLET | Freq: Two times a day (BID) | ORAL | 0 refills | Status: AC

## 2020-10-08 NOTE — Progress Notes (Signed)
Acute office Visit     This visit occurred during the SARS-CoV-2 public health emergency.  Safety protocols were in place, including screening questions prior to the visit, additional usage of staff PPE, and extensive cleaning of exam room while observing appropriate contact time as indicated for disinfecting solutions.    CC/Reason for Visit: Dysuria  HPI: Deborah Lawson is a 30 y.o. female who is coming in today for the above mentioned reasons.  For the past 10 days she has been having dysuria, has noticed her urine is cloudy, no blood, no fever, she has had some mild right lower quadrant pain that is worse with urination.  Also has urinary frequency.  She is still breast-feeding an 32-month-old.  She wonders if she may be pregnant as she has yet to have a period.  Review of history is aided by an in person translator.   Past Medical/Surgical History: Past Medical History:  Diagnosis Date   Gestational diabetes    Gestational hypertension    Medical history non-contributory     Past Surgical History:  Procedure Laterality Date   CESAREAN SECTION N/A 10/26/2019   Procedure: CESAREAN SECTION;  Surgeon: Reva Bores, MD;  Location: [facility];  Service: Obstetrics;  Laterality: N/A;   MANDIBLE SURGERY      Social History:  reports that she has never smoked. She has never used smokeless tobacco. She reports that she does not drink alcohol and does not use drugs.  Allergies: No Known Allergies  Family History:  Family History  Problem Relation Age of Onset   Hypertension Father      Current Outpatient Medications:    sulfamethoxazole-trimethoprim (BACTRIM DS) 800-160 MG tablet, Take 1 tablet by mouth 2 (two) times daily for 7 days., Disp: 14 tablet, Rfl: 0  Review of Systems:  Constitutional: Denies fever, chills, diaphoresis, appetite change and fatigue.  HEENT: Denies photophobia, eye pain, redness, hearing loss, ear pain, congestion, sore throat, rhinorrhea,  sneezing, mouth sores, trouble swallowing, neck pain, neck stiffness and tinnitus.   Respiratory: Denies SOB, DOE, cough, chest tightness,  and wheezing.   Cardiovascular: Denies chest pain, palpitations and leg swelling.  Gastrointestinal: Denies nausea, vomiting, diarrhea, constipation, blood in stool and abdominal distention.  Genitourinary: Positive for dysuria, urgency, frequency, hematuria and difficulty urinating.  Endocrine: Denies: hot or cold intolerance, sweats, changes in hair or nails, polyuria, polydipsia. Musculoskeletal: Denies myalgias, back pain, joint swelling, arthralgias and gait problem.  Skin: Denies pallor, rash and wound.  Neurological: Denies dizziness, seizures, syncope, weakness, light-headedness, numbness and headaches.  Hematological: Denies adenopathy. Easy bruising, personal or family bleeding history  Psychiatric/Behavioral: Denies suicidal ideation, mood changes, confusion, nervousness, sleep disturbance and agitation    Physical Exam: Vitals:   10/08/20 1338  BP: 120/88  Pulse: (!) 103  Temp: 98.3 F (36.8 C)  TempSrc: Oral  SpO2: 98%  Weight: 191 lb 11.2 oz (87 kg)    Body mass index is 35.06 kg/m.   Constitutional: NAD, calm, comfortable Eyes: PERRL, lids and conjunctivae normal ENMT: Mucous membranes are moist.  Neurologic: Grossly intact and nonfocal. Psychiatric: Normal judgment and insight. Alert and oriented x 3. Normal mood.    Impression and Plan:  Acute cystitis with hematuria  - Plan: POCT urinalysis dipstick, POCT urine pregnancy, Urine Culture, Urine Culture, sulfamethoxazole-trimethoprim (BACTRIM DS) 800-160 MG tablet -In office dipstick is consistent with UTI with leukocytes and blood and protein. -She will be prescribed Bactrim for 7 days, will send  for formal UA and culture.  Amenorrhea  - Plan: POCT urine pregnancy is negative.    Patient Instructions  -Nice seeing you today!!  -Pregnancy test is  negative.  -Start Bactrim DS 1 tablet twice daily for 7 days.      Chaya Jan, MD [facility]

## 2020-10-08 NOTE — Patient Instructions (Signed)
-  Nice seeing you today!!  -Pregnancy test is negative.  -Start Bactrim DS 1 tablet twice daily for 7 days.

## 2020-10-09 LAB — URINE CULTURE
MICRO NUMBER:: 12170584
SPECIMEN QUALITY:: ADEQUATE

## 2020-11-02 ENCOUNTER — Ambulatory Visit (HOSPITAL_COMMUNITY)
Admission: EM | Admit: 2020-11-02 | Discharge: 2020-11-02 | Disposition: A | Discharge status: Home / Self Care | Payer: Medicaid Other | Attending: Emergency Medicine | Admitting: Emergency Medicine

## 2020-11-02 ENCOUNTER — Other Ambulatory Visit: Payer: Self-pay

## 2020-11-02 ENCOUNTER — Encounter (HOSPITAL_COMMUNITY): Payer: Self-pay

## 2020-11-02 DIAGNOSIS — R109 Unspecified abdominal pain: Secondary | ICD-10-CM | POA: Diagnosis not present

## 2020-11-02 DIAGNOSIS — N390 Urinary tract infection, site not specified: Secondary | ICD-10-CM | POA: Insufficient documentation

## 2020-11-02 DIAGNOSIS — R300 Dysuria: Secondary | ICD-10-CM | POA: Insufficient documentation

## 2020-11-02 LAB — POCT URINALYSIS DIPSTICK, ED / UC
Bilirubin Urine: NEGATIVE
Glucose, UA: NEGATIVE mg/dL
Ketones, ur: NEGATIVE mg/dL
Nitrite: NEGATIVE
Protein, ur: NEGATIVE mg/dL
Specific Gravity, Urine: 1.015 (ref 1.005–1.030)
Urobilinogen, UA: 0.2 mg/dL (ref 0.0–1.0)
pH: 7 (ref 5.0–8.0)

## 2020-11-02 LAB — POC URINE PREG, ED: Preg Test, Ur: NEGATIVE

## 2020-11-02 MED ORDER — NITROFURANTOIN MONOHYD MACRO 100 MG PO CAPS: 100.0000 mg | ORAL_CAPSULE | Freq: Two times a day (BID) | ORAL | 0 refills | Status: DC

## 2020-11-02 NOTE — ED Triage Notes (Signed)
Pt c/o lower abd and back pain, painful urination.  Started: 5 days ago

## 2020-11-02 NOTE — ED Provider Notes (Addendum)
[facility]   CC: Burning with urination  SUBJECTIVE:  Deborah Lawson is a 30 y.o. female who presented to the urgent care with a complaint of lower abdominal/ flank pain, painful urination for the past 5 days.  Patient denies a precipitating event, recent sexual encounter, excessive caffeine intake.  Localizes the pain to the lower abdomen/ flank.  Pain is intermittent and describes it as achy.  Has tried OTC medications without relief.  Symptoms are made worse with urination.  Admits to similar symptoms in the past.  Denies fever, chills, nausea, vomiting, abdominal pain, flank pain, abnormal vaginal discharge or bleeding, hematuria.    LMP: No LMP recorded (lmp unknown). (Menstrual status: Irregular Periods).  ROS: As in HPI.  All other pertinent ROS negative.     Past Medical History:  Diagnosis Date   Gestational diabetes    Gestational hypertension    Medical history non-contributory    Past Surgical History:  Procedure Laterality Date   CESAREAN SECTION N/A 10/26/2019   Procedure: CESAREAN SECTION;  Surgeon: Reva Bores, MD;  Location: [facility];  Service: Obstetrics;  Laterality: N/A;   MANDIBLE SURGERY     No Known Allergies No current facility-administered medications on file prior to encounter.   Current Outpatient Medications on File Prior to Encounter  Medication Sig Dispense Refill   [DISCONTINUED] NIFEdipine (PROCARDIA XL) 30 MG 24 hr tablet Take 1 tablet (30 mg total) by mouth daily. 30 tablet 1   Social History   Socioeconomic History   Marital status: Married    Spouse name: Not on file   Number of children: Not on file   Years of education: Not on file   Highest education level: Not on file  Occupational History   Not on file  Tobacco Use   Smoking status: Never   Smokeless tobacco: Never  Vaping Use   Vaping Use: Never used  Substance and Sexual Activity   Alcohol use: Never   Drug use: Never   Sexual activity: Not on file   Other Topics Concern   Not on file  Social History Narrative   Right handed   Social Determinants of Health   Financial Resource Strain: Not on file  Food Insecurity: Not on file  Transportation Needs: Not on file  Physical Activity: Not on file  Stress: Not on file  Social Connections: Not on file  Intimate Partner Violence: Not on file   Family History  Problem Relation Age of Onset   Hypertension Father     OBJECTIVE:  Vitals:   11/02/20 1020  BP: 123/81  Pulse: 88  Resp: 18  Temp: 98.4 F (36.9 C)  TempSrc: Oral  SpO2: 100%   General appearance: AOx3 in no acute distress HEENT: NCAT.  Oropharynx clear.  Lungs: clear to auscultation bilaterally without adventitious breath sounds Heart: regular rate and rhythm.  Radial pulses 2+ symmetrical bilaterally Abdomen: soft; non-distended; no tenderness; bowel sounds present; no guarding or rebound tenderness Back: no CVA tenderness Extremities: no edema; symmetrical with no gross deformities Skin: warm and dry Neurologic: Ambulates from chair to exam table without difficulty Psychological: alert and cooperative; normal mood and affect  Labs Reviewed  POCT URINALYSIS DIPSTICK, ED / UC - Abnormal; Notable for the following components:      Result Value   Hgb urine dipstick TRACE (*)    Leukocytes,Ua LARGE (*)    All other components within normal limits  URINE CULTURE  POC URINE PREG, ED  ASSESSMENT & PLAN:  1. Flank pain   2. Dysuria   3. Acute UTI     Meds ordered this encounter  Medications   nitrofurantoin, macrocrystal-monohydrate, (MACROBID) 100 MG capsule    Sig: Take 1 capsule (100 mg total) by mouth 2 (two) times daily.    Dispense:  10 capsule    Refill:  0   Patient is stable at discharge.  Urine analysis showed trace of blood and large amount of white blood cell, likely from UTI.  Will prescribed Macrobid and await urine culture result  Discharge instructions  Urine culture sent.  We  will call you with the results.   Push fluids and get plenty of rest.   Take antibiotic as directed and to completion Follow up with PCP if symptoms persists Return here or go to ER if you have any new or worsening symptoms such as fever, worsening abdominal pain, nausea/vomiting, flank pain, etc...  Outlined signs and symptoms indicating need for more acute intervention. Patient verbalized understanding. After Visit Summary given.      Durward Parcel, FNP 11/02/20 1043    Durward Parcel, FNP 11/02/20 1044

## 2020-11-02 NOTE — Discharge Instructions (Addendum)
Urine culture sent.  We will call you with the results.   Push fluids and get plenty of rest.   Take antibiotic as directed and to completion Follow up with PCP if symptoms persists Return here or go to ER if you have any new or worsening symptoms such as fever, worsening abdominal pain, nausea/vomiting, flank pain, etc... 

## 2020-11-03 LAB — URINE CULTURE

## 2020-11-07 ENCOUNTER — Ambulatory Visit (INDEPENDENT_AMBULATORY_CARE_PROVIDER_SITE_OTHER): Payer: Medicaid Other | Admitting: Internal Medicine

## 2020-11-07 ENCOUNTER — Encounter: Payer: Self-pay | Admitting: Internal Medicine

## 2020-11-07 ENCOUNTER — Other Ambulatory Visit: Payer: Self-pay

## 2020-11-07 VITALS — BP 120/84 | HR 102 | Temp 98.2°F | Ht 64.5 in | Wt 193.1 lb

## 2020-11-07 DIAGNOSIS — H539 Unspecified visual disturbance: Secondary | ICD-10-CM | POA: Diagnosis not present

## 2020-11-07 DIAGNOSIS — N3001 Acute cystitis with hematuria: Secondary | ICD-10-CM

## 2020-11-07 DIAGNOSIS — Z124 Encounter for screening for malignant neoplasm of cervix: Secondary | ICD-10-CM

## 2020-11-07 DIAGNOSIS — Z0000 Encounter for general adult medical examination without abnormal findings: Secondary | ICD-10-CM

## 2020-11-07 LAB — LIPID PANEL
Cholesterol: 167 mg/dL (ref 0–200)
HDL: 51.3 mg/dL (ref >39.00)
LDL Cholesterol: 106 mg/dL — ABNORMAL HIGH (ref 0–99)
NonHDL: 115.6
Total CHOL/HDL Ratio: 3
Triglycerides: 49 mg/dL (ref 0.0–149.0)
VLDL: 9.8 mg/dL (ref 0.0–40.0)

## 2020-11-07 LAB — CBC WITH DIFFERENTIAL/PLATELET
Basophils Absolute: 0 10*3/uL (ref 0.0–0.1)
Basophils Relative: 0.5 % (ref 0.0–3.0)
Eosinophils Absolute: 0.1 10*3/uL (ref 0.0–0.7)
Eosinophils Relative: 1 % (ref 0.0–5.0)
HCT: 37.6 % (ref 36.0–46.0)
Hemoglobin: 12.4 g/dL (ref 12.0–15.0)
Lymphocytes Relative: 39.8 % (ref 12.0–46.0)
Lymphs Abs: 2.9 10*3/uL (ref 0.7–4.0)
MCHC: 32.9 g/dL (ref 30.0–36.0)
MCV: 77.7 fl — ABNORMAL LOW (ref 78.0–100.0)
Monocytes Absolute: 0.5 10*3/uL (ref 0.1–1.0)
Monocytes Relative: 7 % (ref 3.0–12.0)
Neutro Abs: 3.7 10*3/uL (ref 1.4–7.7)
Neutrophils Relative %: 51.7 % (ref 43.0–77.0)
Platelets: 422 10*3/uL — ABNORMAL HIGH (ref 150.0–400.0)
RBC: 4.85 Mil/uL (ref 3.87–5.11)
RDW: 15.7 % — ABNORMAL HIGH (ref 11.5–15.5)
WBC: 7.2 10*3/uL (ref 4.0–10.5)

## 2020-11-07 LAB — POCT URINALYSIS DIPSTICK
Bilirubin, UA: NEGATIVE
Blood, UA: POSITIVE
Glucose, UA: NEGATIVE
Ketones, UA: NEGATIVE
Nitrite, UA: NEGATIVE
Protein, UA: NEGATIVE
Spec Grav, UA: >=1.03 — AB (ref 1.010–1.025)
Urobilinogen, UA: 0.2 E.U./dL
pH, UA: 6 (ref 5.0–8.0)

## 2020-11-07 LAB — VITAMIN D 25 HYDROXY (VIT D DEFICIENCY, FRACTURES): VITD: 14.53 ng/mL — ABNORMAL LOW (ref 30.00–100.00)

## 2020-11-07 LAB — COMPREHENSIVE METABOLIC PANEL
ALT: 35 U/L (ref 0–35)
AST: 22 U/L (ref 0–37)
Albumin: 4.1 g/dL (ref 3.5–5.2)
Alkaline Phosphatase: 106 U/L (ref 39–117)
BUN: 13 mg/dL (ref 6–23)
CO2: 25 mEq/L (ref 19–32)
Calcium: 9.4 mg/dL (ref 8.4–10.5)
Chloride: 103 mEq/L (ref 96–112)
Creatinine, Ser: 0.5 mg/dL (ref 0.40–1.20)
GFR: 127.44 mL/min (ref >60.00)
Glucose, Bld: 89 mg/dL (ref 70–99)
Potassium: 4.5 mEq/L (ref 3.5–5.1)
Sodium: 138 mEq/L (ref 135–145)
Total Bilirubin: 0.3 mg/dL (ref 0.2–1.2)
Total Protein: 7.5 g/dL (ref 6.0–8.3)

## 2020-11-07 LAB — TSH: TSH: 1.96 u[IU]/mL (ref 0.35–5.50)

## 2020-11-07 LAB — VITAMIN B12: Vitamin B-12: 373 pg/mL (ref 211–911)

## 2020-11-07 LAB — HEMOGLOBIN A1C: Hgb A1c MFr Bld: 5.9 % (ref 4.6–6.5)

## 2020-11-07 MED ORDER — SULFAMETHOXAZOLE-TRIMETHOPRIM 800-160 MG PO TABS: 1.0000 | ORAL_TABLET | Freq: Two times a day (BID) | ORAL | 0 refills | Status: AC

## 2020-11-07 NOTE — Addendum Note (Signed)
Addended by: Kandra Nicolas on: 11/07/2020 08:08 AM   Modules accepted: Orders

## 2020-11-07 NOTE — Progress Notes (Signed)
Established Patient Office Visit     This visit occurred during the SARS-CoV-2 public health emergency.  Safety protocols were in place, including screening questions prior to the visit, additional usage of staff PPE, and extensive cleaning of exam room while observing appropriate contact time as indicated for disinfecting solutions.    CC/Reason for Visit: Annual preventive exam, dysuria  HPI: Deborah Lawson is a 30 y.o. female who is coming in today for the above mentioned reasons.  She has no past medical history of significance.  In the past 6 weeks she has been treated twice for UTI.  She continues to have frequency and dysuria and now has right flank and suprapubic pain.  She has routine dental care but no eye care.  She does not exercise routinely.  She does not have a GYN, she has had 2 COVID vaccines needs her booster, she had her Tdap while pregnant.  Past Medical/Surgical History: Past Medical History:  Diagnosis Date   Gestational diabetes    Gestational hypertension    Medical history non-contributory     Past Surgical History:  Procedure Laterality Date   CESAREAN SECTION N/A 10/26/2019   Procedure: CESAREAN SECTION;  Surgeon: Reva Bores, MD;  Location: [facility];  Service: Obstetrics;  Laterality: N/A;   MANDIBLE SURGERY      Social History:  reports that she has never smoked. She has never used smokeless tobacco. She reports that she does not drink alcohol and does not use drugs.  Allergies: No Known Allergies  Family History:  Family History  Problem Relation Age of Onset   Hypertension Father      Current Outpatient Medications:    sulfamethoxazole-trimethoprim (BACTRIM DS) 800-160 MG tablet, Take 1 tablet by mouth 2 (two) times daily for 7 days., Disp: 14 tablet, Rfl: 0  Review of Systems:  Constitutional: Denies fever, chills, diaphoresis, appetite change and fatigue.  HEENT: Denies photophobia, eye pain, redness, hearing loss, ear pain,  congestion, sore throat, rhinorrhea, sneezing, mouth sores, trouble swallowing, neck pain, neck stiffness and tinnitus.   Respiratory: Denies SOB, DOE, cough, chest tightness,  and wheezing.   Cardiovascular: Denies chest pain, palpitations and leg swelling.  Gastrointestinal: Denies nausea, vomiting, abdominal pain, diarrhea, constipation, blood in stool and abdominal distention.  Genitourinary: Positive for dysuria, urgency, frequency, hematuria, flank pain and difficulty urinating.  Endocrine: Denies: hot or cold intolerance, sweats, changes in hair or nails, polyuria, polydipsia. Musculoskeletal: Denies myalgias, back pain, joint swelling, arthralgias and gait problem.  Skin: Denies pallor, rash and wound.  Neurological: Denies dizziness, seizures, syncope, weakness, light-headedness, numbness and headaches.  Hematological: Denies adenopathy. Easy bruising, personal or family bleeding history  Psychiatric/Behavioral: Denies suicidal ideation, mood changes, confusion, nervousness, sleep disturbance and agitation    Physical Exam: Vitals:   11/07/20 0732  BP: 120/84  Pulse: (!) 102  Temp: 98.2 F (36.8 C)  TempSrc: Oral  SpO2: 98%  Weight: 193 lb 1.6 oz (87.6 kg)  Height: 5' 4.5" (1.638 m)    Body mass index is 32.63 kg/m.   Constitutional: NAD, calm, comfortable Eyes: PERRL, lids and conjunctivae normal ENMT: Mucous membranes are moist. Posterior pharynx clear of any exudate or lesions. Normal dentition. Tympanic membrane is pearly white, no erythema or bulging. Neck: normal, supple, no masses, no thyromegaly Respiratory: clear to auscultation bilaterally, no wheezing, no crackles. Normal respiratory effort. No accessory muscle use.  Cardiovascular: Regular rate and rhythm, no murmurs / rubs / gallops. No extremity  edema. 2+ pedal pulses. No carotid bruits.  Abdomen: no tenderness, no masses palpated. No hepatosplenomegaly. Bowel sounds positive.  Musculoskeletal: no clubbing  / cyanosis. No joint deformity upper and lower extremities. Good ROM, no contractures. Normal muscle tone.  Skin: no rashes, lesions, ulcers. No induration Neurologic: CN 2-12 grossly intact. Sensation intact, DTR normal. Strength 5/5 in all 4.  Psychiatric: Normal judgment and insight. Alert and oriented x 3. Normal mood.    Impression and Plan:  Encounter for preventive health examination  -Advised routine eye and dental care. -She is due for COVID booster but otherwise immunizations are up-to-date. -Screening labs today. -Healthy lifestyle discussed in detail. -She will be referred to GYN. -Commence routine breast cancer screening age 72 and colon cancer screening age 37.  Acute cystitis with hematuria  - Plan: POCT urinalysis dipstick, Urine Culture, US Renal, sulfamethoxazole-trimethoprim (BACTRIM DS) 800-160 MG tablet -In office urine dipstick with blood and leukocytes. -Treat with Bactrim, since this is her third episode in 6 weeks we will also send her for renal ultrasound.  Screening for malignant neoplasm of cervix  - Plan: Ambulatory referral to Obstetrics / Gynecology  Vision changes  - Plan: Ambulatory referral to Ophthalmology   Patient Instructions  -Nice seeing you today!!  -Lab work today; will notify you once results are available.  -You have a urine infection. Start antibiotic: Bactrim DS 1 tablet twice daily for 7 days. I will also send you for an ultrasound of your kidneys.  -Remember to get your COVID booster.  -Referrals for GYN and eye doctor have been made.  -Schedule follow up in 1 year or sooner as needed.    Chaya Jan, MD [facility] at [facility]

## 2020-11-07 NOTE — Patient Instructions (Addendum)
-  Nice seeing you today!!  -Lab work today; will notify you once results are available.  -You have a urine infection. Start antibiotic: Bactrim DS 1 tablet twice daily for 7 days. I will also send you for an ultrasound of your kidneys.  -Remember to get your COVID booster.  -Referrals for GYN and eye doctor have been made.  -Schedule follow up in 1 year or sooner as needed.

## 2020-11-08 LAB — URINE CULTURE
MICRO NUMBER:: 12296827
SPECIMEN QUALITY:: ADEQUATE

## 2020-11-11 ENCOUNTER — Encounter: Payer: Self-pay | Admitting: Internal Medicine

## 2020-11-11 ENCOUNTER — Other Ambulatory Visit: Payer: Self-pay | Admitting: Internal Medicine

## 2020-11-11 DIAGNOSIS — E559 Vitamin D deficiency, unspecified: Secondary | ICD-10-CM

## 2020-11-11 DIAGNOSIS — E611 Iron deficiency: Secondary | ICD-10-CM | POA: Insufficient documentation

## 2020-11-11 HISTORY — DX: Vitamin D deficiency, unspecified: E55.9

## 2020-11-11 MED ORDER — VITAMIN D (ERGOCALCIFEROL) 1.25 MG (50000 UNIT) PO CAPS: 50000.0000 [IU] | ORAL_CAPSULE | ORAL | 0 refills | Status: AC

## 2020-11-11 MED ORDER — IRON (FERROUS SULFATE) 325 (65 FE) MG PO TABS: 325.0000 mg | ORAL_TABLET | Freq: Every day | ORAL | Status: DC

## 2020-11-24 ENCOUNTER — Telehealth: Payer: Self-pay | Admitting: Neurology

## 2020-11-24 NOTE — Telephone Encounter (Signed)
Pt said she needs a nurse to call her. She is hurting in her left eye/head.

## 2020-11-25 NOTE — Telephone Encounter (Signed)
Called patient and informed her per Dr. Everlena Cooper It appears that she is not taking the gabapentin that he prescribed.  That is to help with the pain. Dr. Everlena Cooper would recommend restarting it as prescribed.  Also, Dr Everlena Cooper recommends following up with the dentist. Patient asked if we can refer her to a dentist. I informed patient that she would need to contact her insurance company and they can assist her in finding a dentist that is within network. Patient verbalized understanding and had no further questions or concerns.

## 2020-11-25 NOTE — Telephone Encounter (Signed)
Called patient and she stated that the left side of her face, head, and ear hurts. She states it has been going on for a couple of days and it is still hurting today.  Informed patient that I will send Dr. Everlena Cooper a message and give her a call back once I hear back.

## 2020-11-26 ENCOUNTER — Encounter: Payer: Self-pay | Admitting: Internal Medicine

## 2020-12-02 ENCOUNTER — Telehealth: Payer: Self-pay | Admitting: Internal Medicine

## 2020-12-02 DIAGNOSIS — N390 Urinary tract infection, site not specified: Secondary | ICD-10-CM

## 2020-12-02 DIAGNOSIS — R109 Unspecified abdominal pain: Secondary | ICD-10-CM

## 2020-12-02 NOTE — Telephone Encounter (Signed)
[facility] called to relay info that the referral for the Ultrasound needs to show [phone] instead of [phone]. Please advise.

## 2020-12-03 NOTE — Telephone Encounter (Signed)
Order placed

## 2020-12-03 NOTE — Addendum Note (Signed)
Addended by: Kern Reap B on: 12/03/2020 04:09 PM   Modules accepted: Orders

## 2020-12-09 ENCOUNTER — Ambulatory Visit
Admission: RE | Admit: 2020-12-09 | Discharge: 2020-12-09 | Disposition: A | Discharge status: Home / Self Care | Payer: Medicaid Other | Source: Ambulatory Visit | Attending: Internal Medicine | Admitting: Internal Medicine

## 2020-12-09 DIAGNOSIS — N3001 Acute cystitis with hematuria: Secondary | ICD-10-CM

## 2020-12-09 IMAGING — US US RENAL
1 series · 14 of 25 positions shown · non-contrast
Comparison: CT abdomen pelvis dated [DATE].

CLINICAL DATA: Recurrent UTI.  Right flank pain.

EXAM:
RENAL / URINARY TRACT ULTRASOUND COMPLETE

[Series 1: us renal · 0.22mm/px · 14 of 30 slices shown]
[im 1/30]
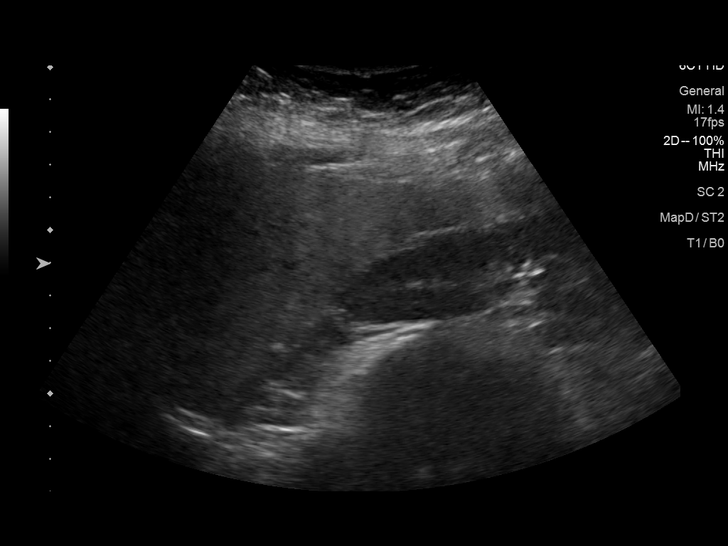
[im 3/30]
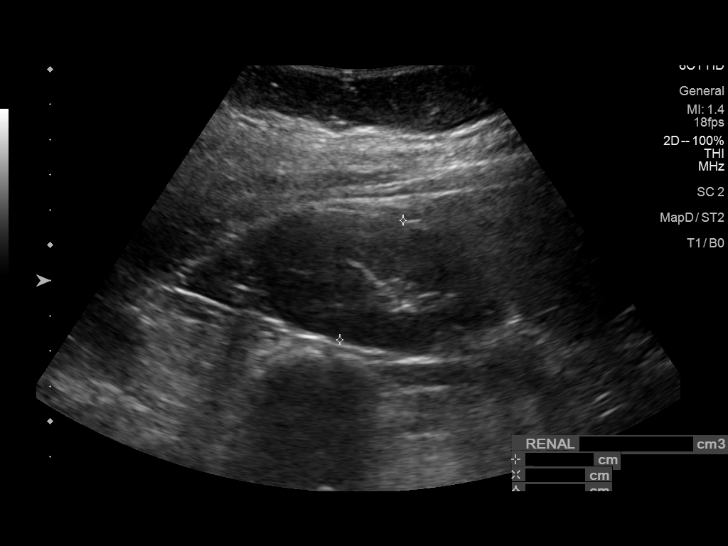
[im 5/30]
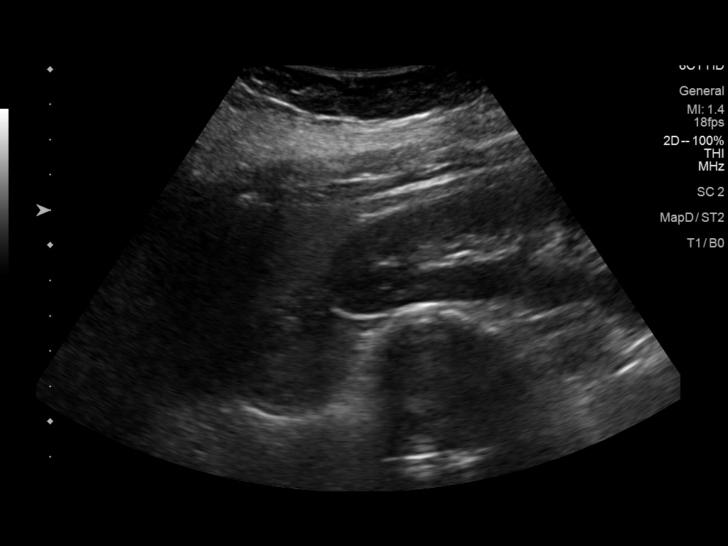
[im 8/30]
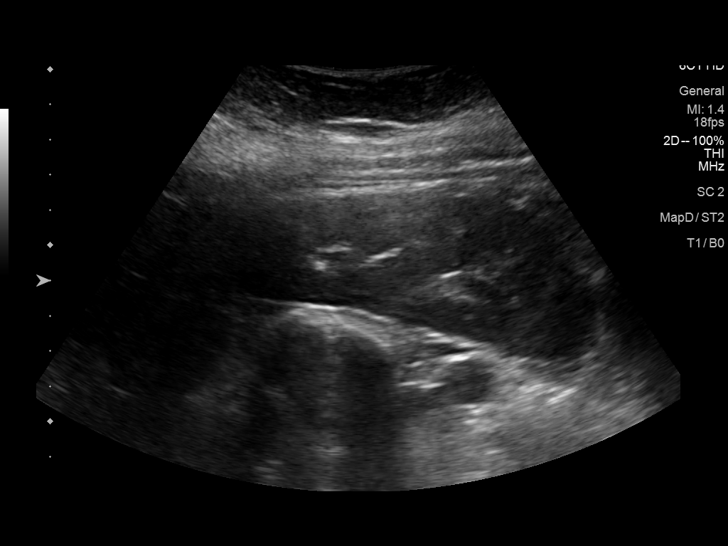
[im 10/30]
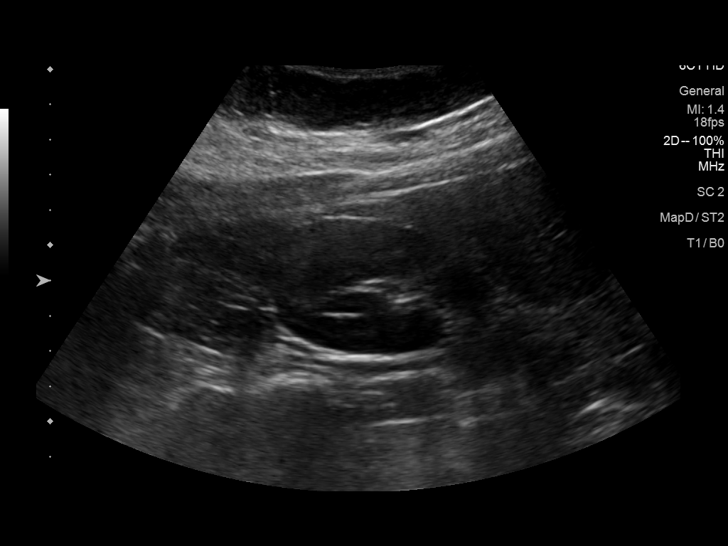
[im 11/30]
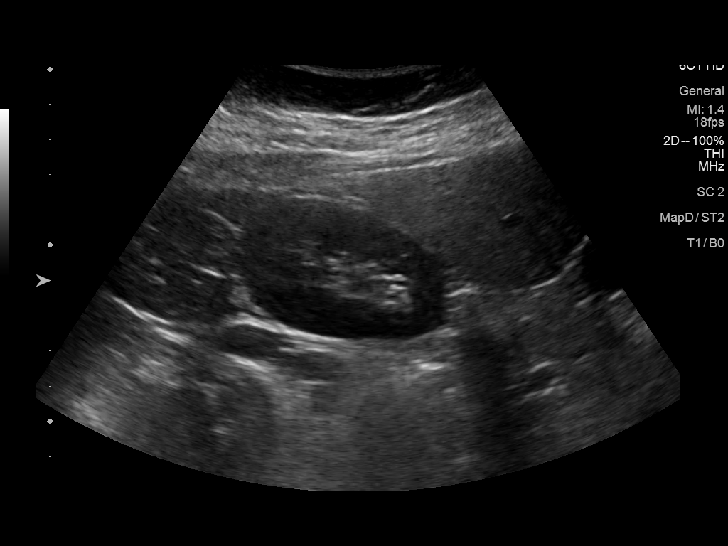
[im 14/30]
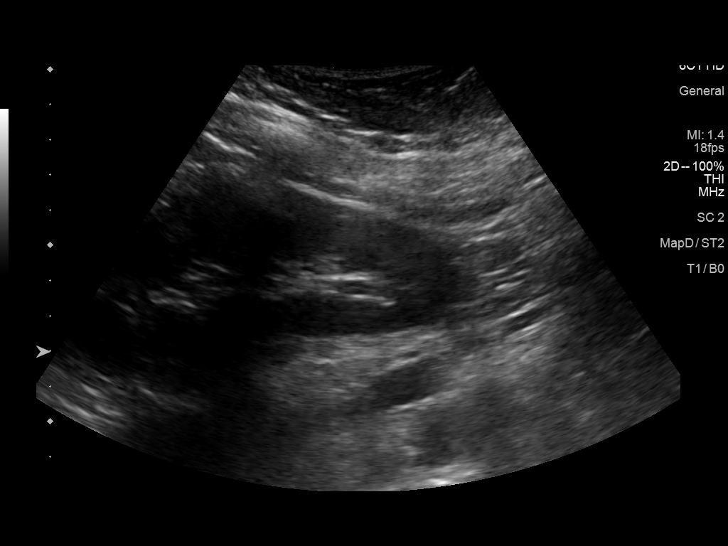
[im 16/30]
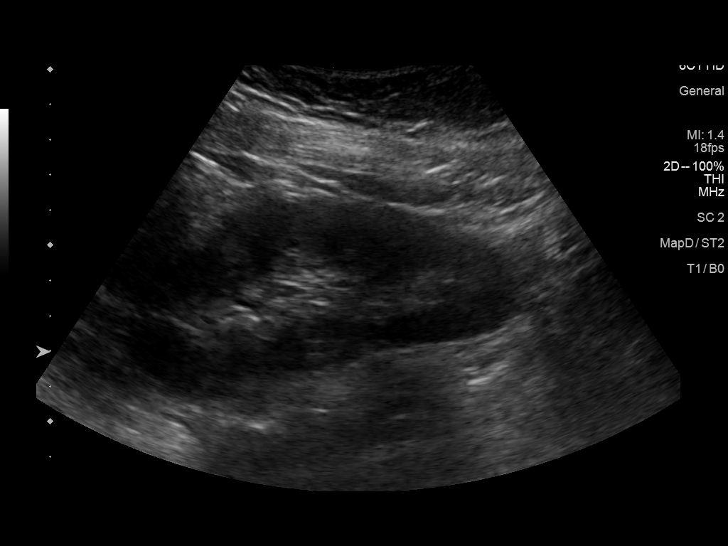
[im 19/30]
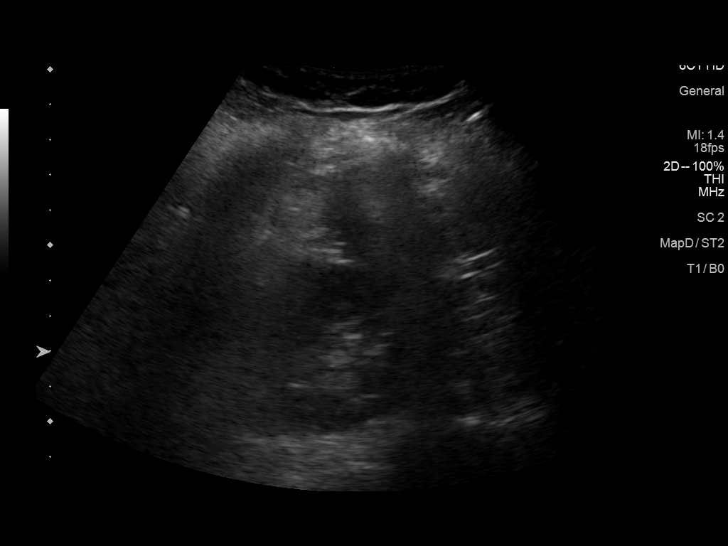
[im 20/30]
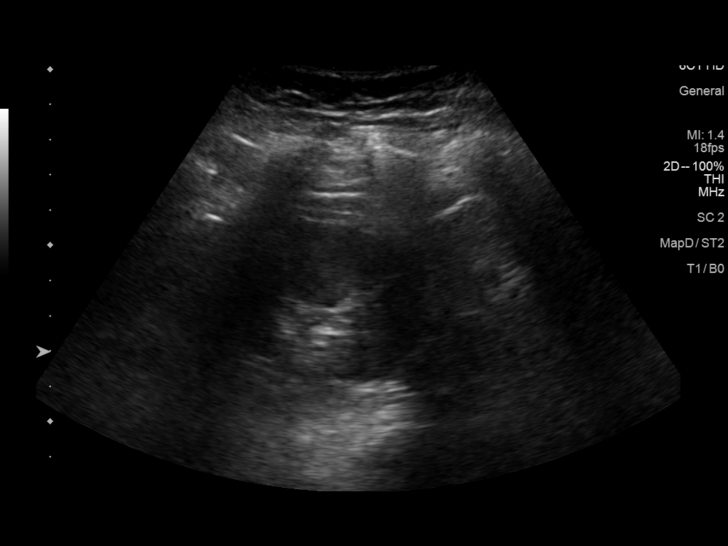
[im 22/30]
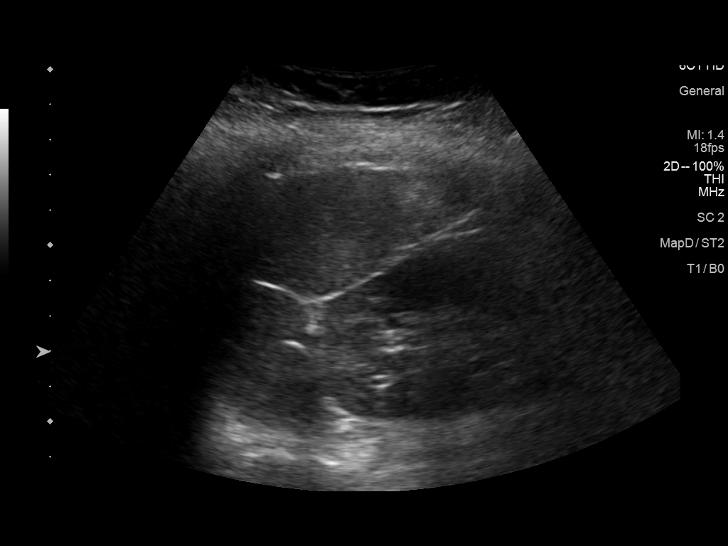
[im 25/30]
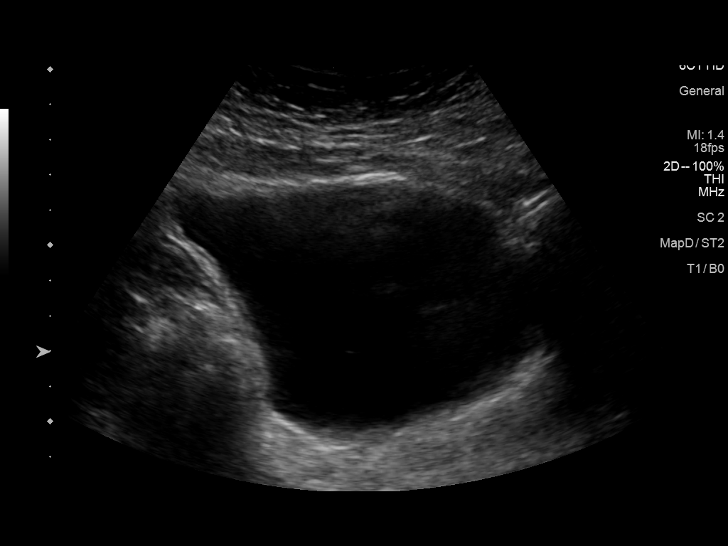
[im 27/30]
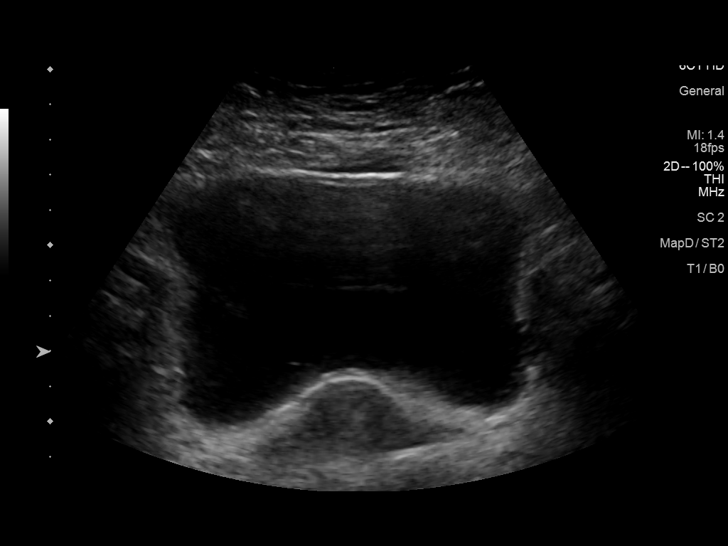
[im 30/30]
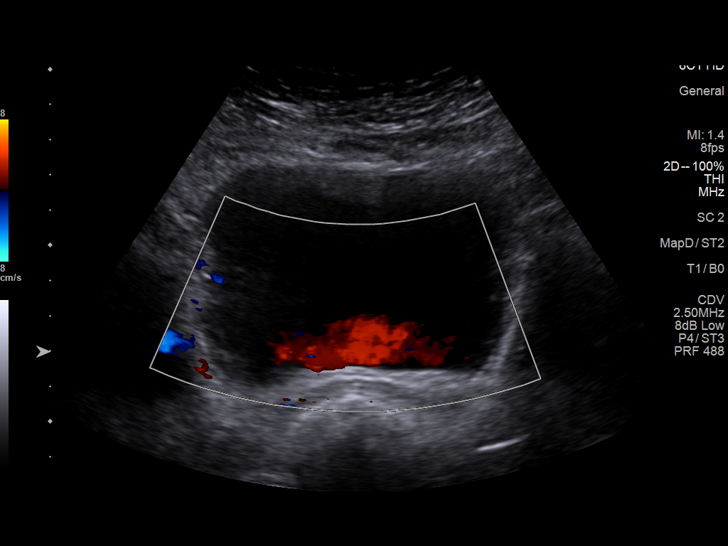

[14 of 25 positions shown; findings below may reference images not displayed]

FINDINGS: Right Kidney:

Renal measurements: 11.6 x 4.7 x 3.8 cm = volume: 108 mL.
Echogenicity within normal limits. No mass or hydronephrosis
visualized.

Left Kidney:

Renal measurements: 11.5 x 5.0 x 5.4 cm = volume: 159 mL.
Echogenicity within normal limits. No mass or hydronephrosis
visualized.

Bladder:

Appears normal for degree of bladder distention.

Other:

There is diffuse increased liver echogenicity most commonly seen in
the setting of fatty infiltration. Superimposed inflammation or
fibrosis is not excluded. Clinical correlation is recommended.
IMPRESSION: 1. Unremarkable kidneys and urinary bladder.
2. Fatty liver.

## 2020-12-11 ENCOUNTER — Encounter: Payer: Self-pay | Admitting: Internal Medicine

## 2020-12-22 NOTE — Progress Notes (Signed)
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  With Medicaid, she is aware that it will likely be only one session approved.  She would still like a referral Follow up 6 months.  Subjective:  Deborah Lawson is a 30 year old female who follows up for atypical facial pain.  She is accompanied by Arabic speaking interpreter.  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  She was referred to endocrinology.  Patient is currently breastfeeding. Started gabapentin for facial pain.  She is only taking 200mg  at bedtime.  She was also advised to see the dentist but doesn't have an appointment until February.     Current NSAIDS/analgesics:  ibuprofen Current triptans:  none Current ergotamine:  none Current anti-emetic:  none Current muscle relaxants:  none Current Antihypertensive medications:  none Current Antidepressant medications:  none Current Anticonvulsant medications:  gabapentin 300mg  QHS Current anti-CGRP:  none Current Vitamins/Herbal/Supplements:  none Current Antihistamines/Decongestants:  none Other therapy:  none Hormone/birth control:  norethindrone   HISTORY: She started having a headache around January  No prior history of headache.  It is a moderate to severe left sided pressure/aching pain involving the left temple, eye, ear and face, including along the jaw and behind TMJ.   Sometimes with associated left sided/occipital numbness and tingling.  It comes and goes during the day the pain lessens and resolves by next day.  They occurs when she feels stressed or depressed, about 3 to 4 days a week.  Sometimes associated neck pain.  No associated nausea, vomiting, photophobia, phonophobia, visual disturbance, autonomic symptoms, numbness or weakness.  She went to the ED where she was diagnosed with a cavity.  She had the tooth pulled but pain persisted.  She was seen in the ED on 04/23/2020 where CT head personally reviewed was unremarkable and she was treated with headache cocktail.  She returned to the ED on 05/31/2020 for left sided TMJ pain where she was prescribed a prednisone taper and instructed to take Motrin as needed.        Past NSAIDS/analgesics:  none Past abortive triptans:  none Past abortive ergotamine:  none Past muscle relaxants:  none Past anti-emetic:  none Past antihypertensive medications:  nifedipine Past antidepressant medications:  none Past anticonvulsant medications:  none Past anti-CGRP:  none Past vitamins/Herbal/Supplements:  none Past antihistamines/decongestants:  none Other past therapies:  none  PAST MEDICAL HISTORY: Past Medical History:  Diagnosis Date   Gestational diabetes    Gestational hypertension    Medical history non-contributory     MEDICATIONS: Current Outpatient Medications on File Prior to Visit  Medication Sig Dispense Refill   Iron, Ferrous Sulfate, 325 (65 Fe) MG TABS Take 325 mg by mouth daily. 30 tablet    Vitamin D, Ergocalciferol, (DRISDOL) 1.25 MG (50000 UNIT) CAPS capsule Take 1 capsule (50,000 Units total) by mouth every 7 (seven) days for 12 doses. 12 capsule 0   [DISCONTINUED] NIFEdipine (  PROCARDIA XL) 30 MG 24 hr tablet Take 1 tablet (30 mg total) by mouth daily. 30 tablet 1   No current facility-administered medications on file prior to visit.    ALLERGIES: No Known Allergies  FAMILY  HISTORY: Family History  Problem Relation Age of Onset   Hypertension Father       Objective:  Blood pressure 127/85, pulse 100, height 5\' 3"  (1.6 m), weight 192 lb (87.1 kg), SpO2 99 %, currently breastfeeding. General: No acute distress.  Patient appears well-groomed.   Tenderness to palpation of left occipital region/mastoid, left cervical paraspinal region and left TMJ.  No crepitus appreciated.  Reduced left V1-V3 sensation to light touch.    , DO  CC: Shon Millet, MD

## 2020-12-23 ENCOUNTER — Other Ambulatory Visit: Payer: Self-pay

## 2020-12-23 ENCOUNTER — Encounter: Payer: Self-pay | Admitting: Neurology

## 2020-12-23 ENCOUNTER — Ambulatory Visit: Payer: Medicaid Other | Admitting: Neurology

## 2020-12-23 VITALS — BP 127/85 | HR 100 | Ht 63.0 in | Wt 192.0 lb

## 2020-12-23 DIAGNOSIS — G501 Atypical facial pain: Secondary | ICD-10-CM | POA: Diagnosis not present

## 2020-12-23 DIAGNOSIS — M26609 Unspecified temporomandibular joint disorder, unspecified side: Secondary | ICD-10-CM | POA: Diagnosis not present

## 2020-12-23 DIAGNOSIS — M542 Cervicalgia: Secondary | ICD-10-CM | POA: Diagnosis not present

## 2020-12-23 MED ORDER — GABAPENTIN 300 MG PO CAPS: ORAL_CAPSULE | ORAL | 0 refills | Status: DC

## 2020-12-23 NOTE — Patient Instructions (Signed)
Start gabapentin 300mg  pill.  Take 1 pill at bedtime for one week, then increase to 1 pill twice daily Follow up with the dentist as your pain may be due to a problem with the jaw joint on the left.  Have the dentist fax the note to me. 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 Follow up in 6 months.    1. ????? ????? ????????? 300 ???. ????? ??? ????? ?? ??? ????? ???? ????? ? ?? ???? ??? ??? ????? ????? ?????? 2. ???? ?? ???? ??????? ??? ?? ????? ???? ???? ?? ?? ???? ???? ????? ?? ???? ???? ??????. ???? ?? ???? ??????? ????? ??????? ??? ???????. 3. ??? ???? ??? ?????? ??????? ????? ?????? ??????? ??? ?????? ?????? ?????. 4. ?????? ?? 6 ????.

## 2020-12-24 ENCOUNTER — Encounter: Payer: Self-pay | Admitting: Internal Medicine

## 2020-12-27 ENCOUNTER — Encounter (HOSPITAL_COMMUNITY): Payer: Self-pay | Admitting: Emergency Medicine

## 2020-12-27 ENCOUNTER — Ambulatory Visit (HOSPITAL_COMMUNITY)
Admission: EM | Admit: 2020-12-27 | Discharge: 2020-12-27 | Disposition: A | Discharge status: Home / Self Care | Payer: Medicaid Other | Attending: Medical Oncology | Admitting: Medical Oncology

## 2020-12-27 ENCOUNTER — Other Ambulatory Visit: Payer: Self-pay

## 2020-12-27 DIAGNOSIS — K047 Periapical abscess without sinus: Secondary | ICD-10-CM | POA: Diagnosis not present

## 2020-12-27 MED ORDER — AMOXICILLIN-POT CLAVULANATE 875-125 MG PO TABS: 1.0000 | ORAL_TABLET | Freq: Two times a day (BID) | ORAL | 0 refills | Status: DC

## 2020-12-27 NOTE — ED Provider Notes (Signed)
[facility]    CSN: [id number] Arrival date & time: 12/27/20  1622      History   Chief Complaint Chief Complaint  Patient presents with   Facial Pain    HPI Deborah Lawson is a 30 y.o. female.  Patient declined interpreter for our visit today.  She was able to answer my questions appropriately and expressed understanding and teach back  HPI  Dental Pain: Patient is having left upper dental pain.  She has had this for the past 2 days but has noticed that she has had sensitivity to eating sugar and sweets in this area for quite some time.  She denies any fever, trouble swallowing or trouble breathing.  She does report that her pain is dull to sharp in nature and extends to her ear.  She has tried Tylenol for symptoms with some relief.  She reports that it has been about 1 year since she went to a dentist.  Past Medical History:  Diagnosis Date   Gestational diabetes    Gestational hypertension    Medical history non-contributory     Patient Active Problem List   Diagnosis Date Noted   Vitamin D deficiency 11/11/2020   Iron deficiency 11/11/2020   Gestational diabetes    Gestational hypertension    Breech presentation of fetus 10/26/2019   Language barrier 10/12/2019   Malpresentation before onset of labor 10/12/2019    Past Surgical History:  Procedure Laterality Date   CESAREAN SECTION N/A 10/26/2019   Procedure: CESAREAN SECTION;  Surgeon: Reva Bores, MD;  Location: [facility];  Service: Obstetrics;  Laterality: N/A;   MANDIBLE SURGERY      OB History     Gravida  3   Para  3   Term  3   Preterm  0   AB  0   Living  3      SAB  0   IAB  0   Ectopic  0   Multiple  0   Live Births  3            Home Medications    Prior to Admission medications   Medication Sig Start Date End Date Taking? Authorizing Provider  gabapentin (NEURONTIN) 300 MG capsule Take 1 capsule at bedtime for one week, then increase to 1 capsule  twice daily 12/23/20   Drema Dallas, DO  Iron, Ferrous Sulfate, 325 (65 Fe) MG TABS Take 325 mg by mouth daily. 11/11/20   Philip Aspen, Limmie Patricia, MD  Vitamin D, Ergocalciferol, (DRISDOL) 1.25 MG (50000 UNIT) CAPS capsule Take 1 capsule (50,000 Units total) by mouth every 7 (seven) days for 12 doses. 11/11/20 01/28/21  Philip Aspen, Limmie Patricia, MD  NIFEdipine (PROCARDIA XL) 30 MG 24 hr tablet Take 1 tablet (30 mg total) by mouth daily. 11/05/19 02/18/20  Donette Larry, CNM    Family History Family History  Problem Relation Age of Onset   Hypertension Father     Social History Social History   Tobacco Use   Smoking status: Never   Smokeless tobacco: Never  Vaping Use   Vaping Use: Never used  Substance Use Topics   Alcohol use: Never   Drug use: Never     Allergies   Patient has no known allergies.   Review of Systems Review of Systems  As stated above in HPI Physical Exam Triage Vital Signs ED Triage Vitals [12/27/20 1723]  Enc Vitals Group     BP  116/77     Pulse Rate 92     Resp 16     Temp (!) 97.5 F (36.4 C)     Temp Source Oral     SpO2 97 %     Weight      Height      Head Circumference      Peak Flow      Pain Score      Pain Loc      Pain Edu?      Excl. in GC?    No data found.  Updated Vital Signs BP 116/77 (BP Location: Left Arm)   Pulse 92   Temp (!) 97.5 F (36.4 C) (Oral)   Resp 16   SpO2 97%    Physical Exam Vitals and nursing note reviewed.  Constitutional:      Appearance: Normal appearance.  HENT:     Head: Normocephalic and atraumatic.     Right Ear: Tympanic membrane normal.     Left Ear: Tympanic membrane normal.     Nose: Nose normal. No congestion or rhinorrhea.     Mouth/Throat:     Mouth: Mucous membranes are moist.     Dentition: Dental tenderness, dental caries, dental abscesses and gum lesions present.     Tongue: No lesions.     Palate: No mass and lesions.     Pharynx: Oropharynx is clear. Uvula  midline. No posterior oropharyngeal erythema or uvula swelling.     Tonsils: No tonsillar exudate.   Eyes:     Extraocular Movements: Extraocular movements intact.     Pupils: Pupils are equal, round, and reactive to light.  Cardiovascular:     Rate and Rhythm: Normal rate and regular rhythm.     Pulses: Normal pulses.     Heart sounds: Normal heart sounds.  Pulmonary:     Effort: Pulmonary effort is normal.     Breath sounds: Normal breath sounds.  Musculoskeletal:     Cervical back: Normal range of motion and neck supple.  Lymphadenopathy:     Cervical: Cervical adenopathy (left sided) present.  Skin:    General: Skin is warm.  Neurological:     Mental Status: She is alert and oriented to person, place, and time.     UC Treatments / Results  Labs (all labs ordered are listed, but only abnormal results are displayed) Labs Reviewed - No data to display  EKG   Radiology No results found.  Procedures Procedures (including critical care time)  Medications Ordered in UC Medications - No data to display  Initial Impression / Assessment and Plan / UC Course  I have reviewed the triage vital signs and the nursing notes.  Pertinent labs & imaging results that were available during my care of the patient were reviewed by me and considered in my medical decision making (see chart for details).     New.  Discussed with patient that she has a dental abscess and many dental caries.  I have recommended a follow-up with her dentist along with treating with Augmentin today to prevent further complications and illness.  Discussed red flag signs symptoms.  She can use Tylenol or ibuprofen to help with any additional pain but the Augmentin should help with the discomfort to heal the abscess.  Follow-up as needed. Final Clinical Impressions(s) / UC Diagnoses   Final diagnoses:  None   Discharge Instructions   None    ED Prescriptions   None    PDMP  not reviewed this  encounter.   Rushie Chestnut, [place] 12/27/20 1739

## 2020-12-27 NOTE — ED Triage Notes (Signed)
C/o pain in left lateral side of face x 2 days.

## 2021-01-01 ENCOUNTER — Encounter: Payer: Self-pay | Admitting: Family Medicine

## 2021-01-01 ENCOUNTER — Encounter (HOSPITAL_BASED_OUTPATIENT_CLINIC_OR_DEPARTMENT_OTHER): Payer: Self-pay | Admitting: Emergency Medicine

## 2021-01-01 ENCOUNTER — Other Ambulatory Visit: Payer: Self-pay

## 2021-01-01 ENCOUNTER — Emergency Department (HOSPITAL_BASED_OUTPATIENT_CLINIC_OR_DEPARTMENT_OTHER): Payer: Medicaid Other

## 2021-01-01 ENCOUNTER — Emergency Department (HOSPITAL_BASED_OUTPATIENT_CLINIC_OR_DEPARTMENT_OTHER)
Admission: EM | Admit: 2021-01-01 | Discharge: 2021-01-01 | Disposition: A | Discharge status: Home / Self Care | Payer: Medicaid Other | Attending: Emergency Medicine | Admitting: Emergency Medicine

## 2021-01-01 ENCOUNTER — Telehealth (INDEPENDENT_AMBULATORY_CARE_PROVIDER_SITE_OTHER): Payer: Medicaid Other | Admitting: Family Medicine

## 2021-01-01 DIAGNOSIS — Z79899 Other long term (current) drug therapy: Secondary | ICD-10-CM | POA: Insufficient documentation

## 2021-01-01 DIAGNOSIS — R1032 Left lower quadrant pain: Secondary | ICD-10-CM | POA: Insufficient documentation

## 2021-01-01 DIAGNOSIS — R109 Unspecified abdominal pain: Secondary | ICD-10-CM

## 2021-01-01 DIAGNOSIS — R319 Hematuria, unspecified: Secondary | ICD-10-CM | POA: Diagnosis not present

## 2021-01-01 DIAGNOSIS — R310 Gross hematuria: Secondary | ICD-10-CM | POA: Diagnosis not present

## 2021-01-01 DIAGNOSIS — R10A0 Flank pain, unspecified side: Secondary | ICD-10-CM

## 2021-01-01 DIAGNOSIS — R509 Fever, unspecified: Secondary | ICD-10-CM

## 2021-01-01 LAB — CBC WITH DIFFERENTIAL/PLATELET
Abs Immature Granulocytes: 0.02 10*3/uL (ref 0.00–0.07)
Basophils Absolute: 0 10*3/uL (ref 0.0–0.1)
Basophils Relative: 0 %
Eosinophils Absolute: 0.1 10*3/uL (ref 0.0–0.5)
Eosinophils Relative: 1 %
HCT: 41.1 % (ref 36.0–46.0)
Hemoglobin: 13.1 g/dL (ref 12.0–15.0)
Immature Granulocytes: 0 %
Lymphocytes Relative: 46 %
Lymphs Abs: 4.4 10*3/uL — ABNORMAL HIGH (ref 0.7–4.0)
MCH: 25.3 pg — ABNORMAL LOW (ref 26.0–34.0)
MCHC: 31.9 g/dL (ref 30.0–36.0)
MCV: 79.3 fL — ABNORMAL LOW (ref 80.0–100.0)
Monocytes Absolute: 0.5 10*3/uL (ref 0.1–1.0)
Monocytes Relative: 6 %
Neutro Abs: 4.4 10*3/uL (ref 1.7–7.7)
Neutrophils Relative %: 47 %
Platelets: 455 10*3/uL — ABNORMAL HIGH (ref 150–400)
RBC: 5.18 MIL/uL — ABNORMAL HIGH (ref 3.87–5.11)
RDW: 15.1 % (ref 11.5–15.5)
WBC: 9.4 10*3/uL (ref 4.0–10.5)
nRBC: 0 % (ref 0.0–0.2)

## 2021-01-01 LAB — URINALYSIS, ROUTINE W REFLEX MICROSCOPIC
Bilirubin Urine: NEGATIVE
Glucose, UA: NEGATIVE mg/dL
Ketones, ur: NEGATIVE mg/dL
Leukocytes,Ua: NEGATIVE
Nitrite: NEGATIVE
Protein, ur: NEGATIVE mg/dL
Specific Gravity, Urine: 1.025 (ref 1.005–1.030)
pH: 6 (ref 5.0–8.0)

## 2021-01-01 LAB — BASIC METABOLIC PANEL
Anion gap: 11 (ref 5–15)
BUN: 12 mg/dL (ref 6–20)
CO2: 24 mmol/L (ref 22–32)
Calcium: 9.3 mg/dL (ref 8.9–10.3)
Chloride: 103 mmol/L (ref 98–111)
Creatinine, Ser: 0.55 mg/dL (ref 0.44–1.00)
GFR, Estimated: >60 mL/min (ref >60)
Glucose, Bld: 82 mg/dL (ref 70–99)
Potassium: 3.6 mmol/L (ref 3.5–5.1)
Sodium: 138 mmol/L (ref 135–145)

## 2021-01-01 LAB — URINALYSIS, MICROSCOPIC (REFLEX)

## 2021-01-01 LAB — PREGNANCY, URINE: Preg Test, Ur: NEGATIVE

## 2021-01-01 IMAGING — CT CT RENAL STONE PROTOCOL
3 of 7 series · 6 of 46 positions shown, 11 images · non-contrast
Comparison: [DATE]

CLINICAL DATA: Flank pain, kidney stone suspected.

EXAM:
CT ABDOMEN AND PELVIS WITHOUT CONTRAST
TECHNIQUE: Multidetector CT imaging of the abdomen and pelvis was performed
following the standard protocol without IV contrast.

[Series 4: lungs · axial · 0.78mm/px · z∈[-65,-35]mm · 2 of 18 slices shown, 5 images]
[im 6/18  soft-tissue]
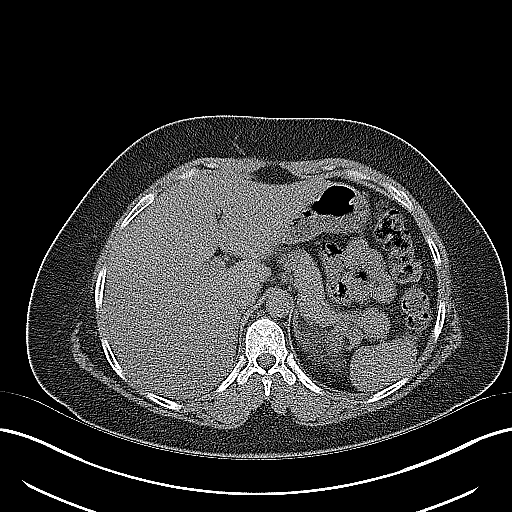
[im 6/18  lung]
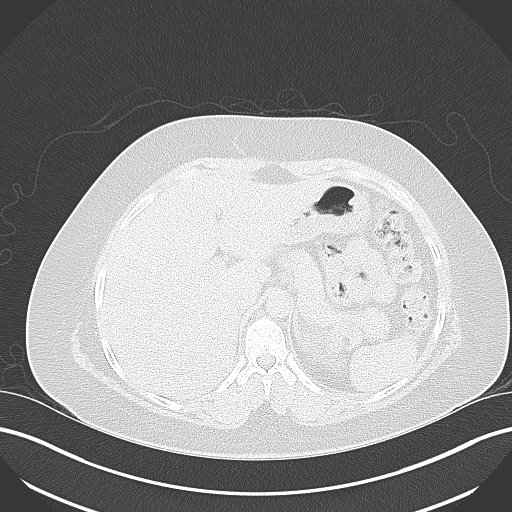
[im 6/18  bone]
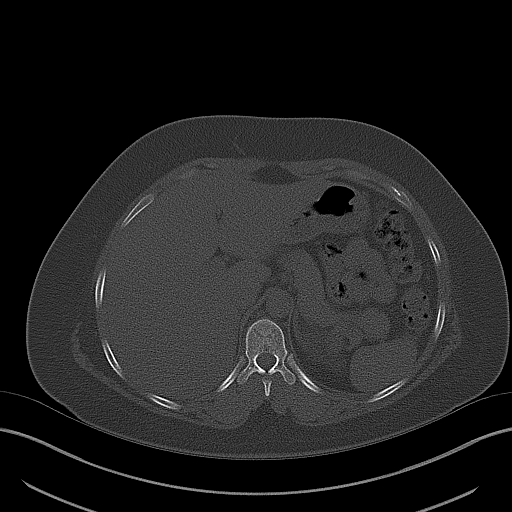
[im 12/18  soft-tissue]
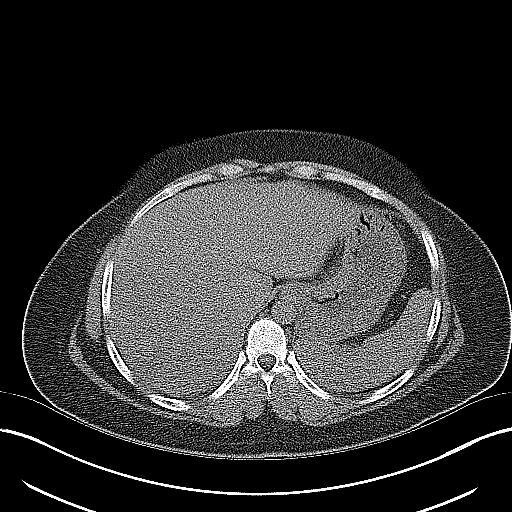
[im 12/18  lung]
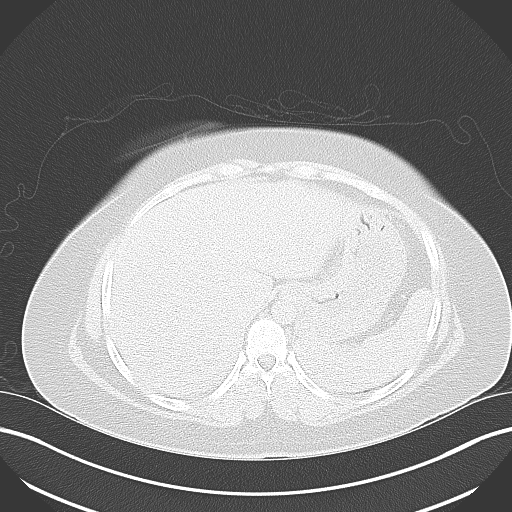

[Series 5: coronal · coronal · 0.87mm/px · 3 of 93 slices shown, 4 images]
[im 24/93  soft-tissue]
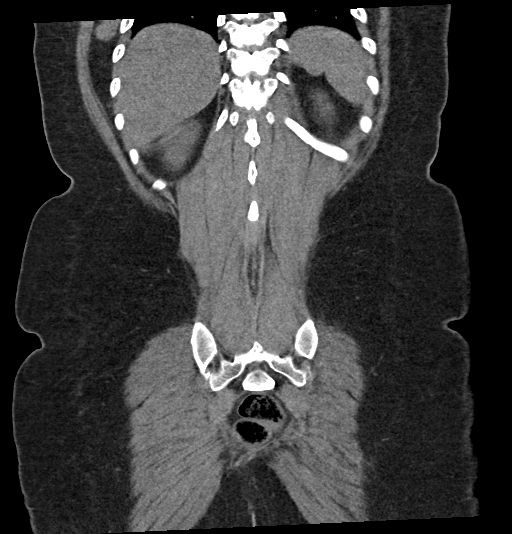
[im 47/93  soft-tissue]
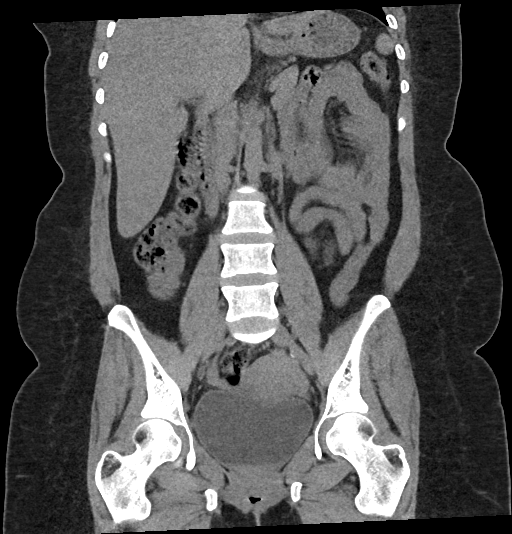
[im 47/93  bone]
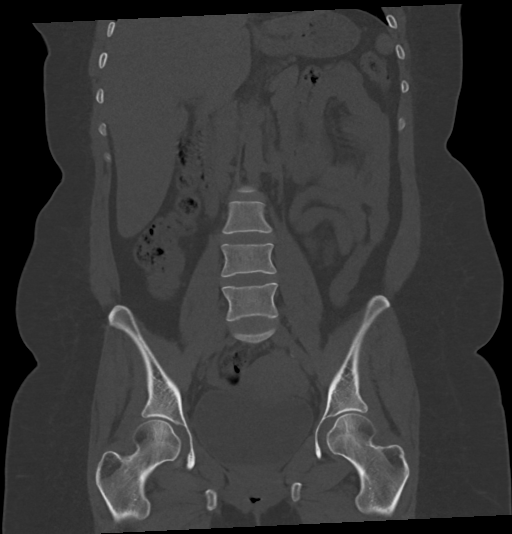
[im 70/93  soft-tissue]
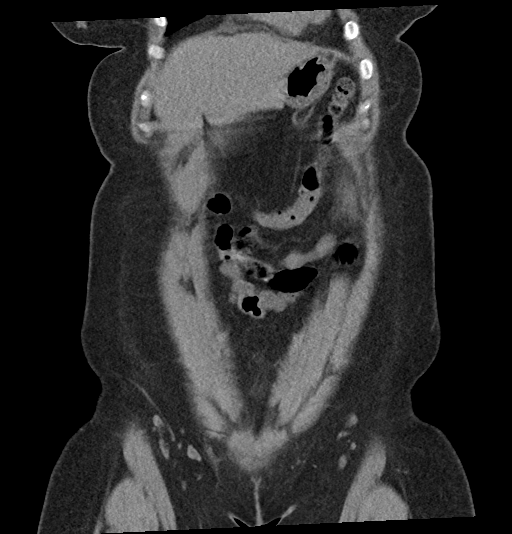

[Series 10: sagittal · sagittal · 0.24mm/px · 1 of 126 slices shown, 2 images]
[im 63/126  soft-tissue]
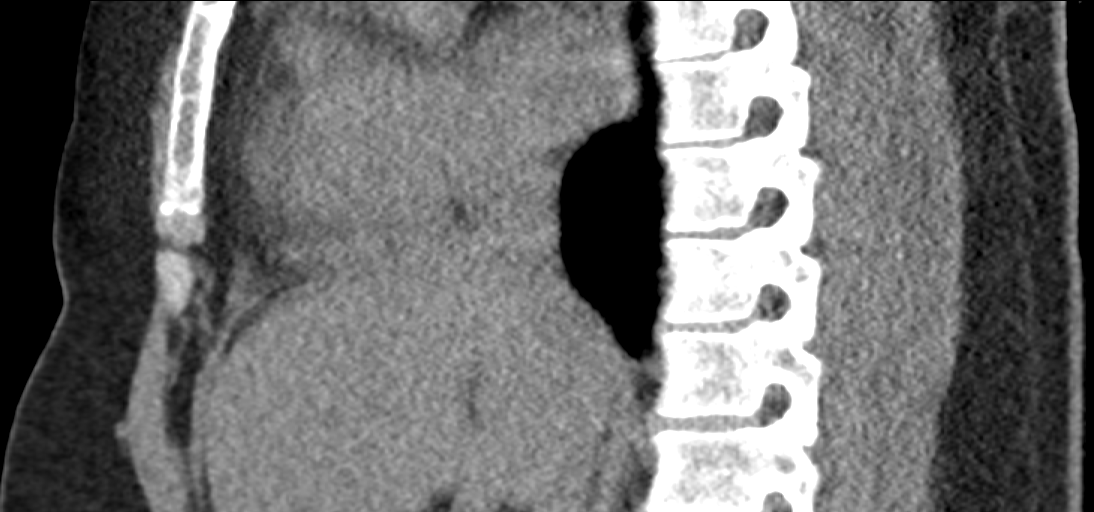
[im 63/126  bone]
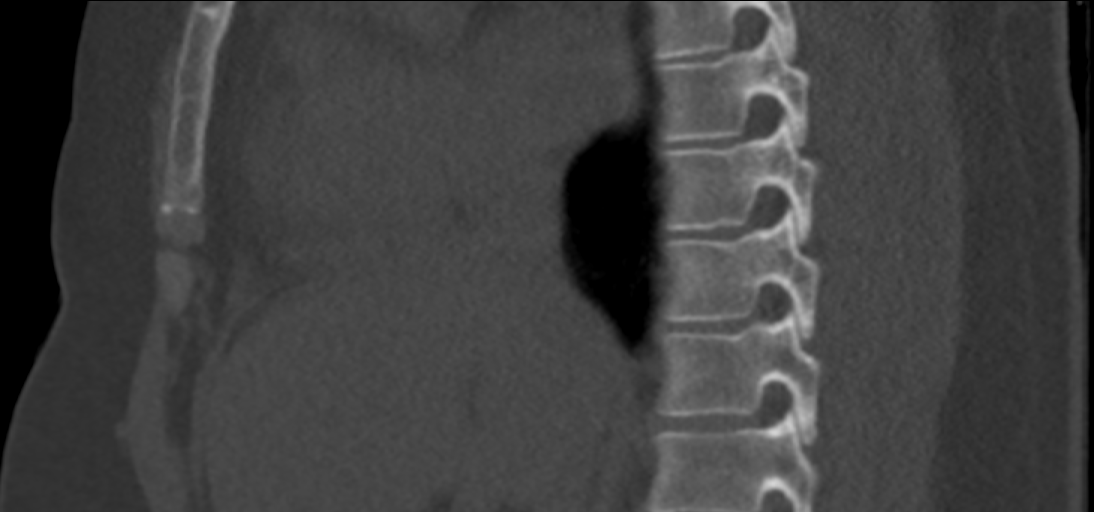

[6 of 46 positions shown; findings below may reference images not displayed]

FINDINGS: Lower chest: Lung bases are clear.

Hepatobiliary: Normal appearance of the liver. Again noted is a
small density in the gallbladder that could represent a small stone.
No gallbladder distension.

Pancreas: Unremarkable. No pancreatic ductal dilatation or
surrounding inflammatory changes.

Spleen: Normal in size without focal abnormality.

Adrenals/Urinary Tract: Normal adrenal glands. Motion artifact
involving the kidneys. Negative for kidney stones or hydronephrosis.
Normal appearance of the urinary bladder.

Stomach/Bowel: Stomach is within normal limits. Appendix appears
normal. No evidence of bowel wall thickening, distention, or
inflammatory changes.

Vascular/Lymphatic: No significant vascular findings are present. No
enlarged abdominal or pelvic lymph nodes.

Reproductive: Uterus and bilateral adnexa are unremarkable.

Other: No ascites. Negative for free air. Mild laxity along the
anterior abdominal wall near the umbilicus is unchanged.

Musculoskeletal: No acute bone abnormality.
IMPRESSION: 1. No acute abnormality in the abdomen or pelvis.
2. No evidence for kidney stones or hydronephrosis.
3. Probable cholelithiasis. No evidence for gallbladder inflammation
or distension.

## 2021-01-01 MED ORDER — IBUPROFEN 600 MG PO TABS: 600.0000 mg | ORAL_TABLET | Freq: Four times a day (QID) | ORAL | 0 refills | Status: DC | PRN

## 2021-01-01 MED ORDER — KETOROLAC TROMETHAMINE 30 MG/ML IJ SOLN: 30.0000 mg | Freq: Once | INTRAMUSCULAR | Status: DC

## 2021-01-01 MED ORDER — OXYCODONE-ACETAMINOPHEN 5-325 MG PO TABS: 1.0000 | ORAL_TABLET | Freq: Once | ORAL | Status: DC

## 2021-01-01 MED ORDER — ACETAMINOPHEN 325 MG PO TABS
650.0000 mg | ORAL_TABLET | Freq: Once | ORAL | Status: AC
  Administered 2021-01-01: 650 mg via ORAL
  Filled 2021-01-01: qty 2

## 2021-01-01 MED ORDER — KETOROLAC TROMETHAMINE 30 MG/ML IJ SOLN
30.0000 mg | Freq: Once | INTRAMUSCULAR | Status: AC
  Administered 2021-01-01: 30 mg via INTRAVENOUS
  Filled 2021-01-01: qty 1

## 2021-01-01 NOTE — Patient Instructions (Signed)
Please seek in person care at the urgent care or Medical Center today as we discussed.    I hope you are feeling better soon!   It was nice to meet you today. I help [facility] out with telemedicine visits on Tuesdays and Thursdays and am available for visits on those days. If you have any concerns or questions following this visit please schedule a follow up visit with your Primary Care doctor or seek care at a local urgent care clinic to avoid delays in care.

## 2021-01-01 NOTE — Discharge Instructions (Addendum)
You were seen in the emergency department today for abdominal pain.  Your lab work and scan today were normal. Your urine showed no sign of infection. We gave you a medicine for pain and I'm glad this seemed to have helped. I think you likely passed a kidney stone which is what caused the blood in your urine.   I am prescribing you ibuprofen, which you can take 1 tablet by mouth every 6 hours as needed for pain.  I'd like you to follow up with your primary doctor if your symptoms continue. Continue to monitor how you're doing and return to the ER for new or worsening symptoms such as worsening pain, fever, vomiting, or diarrhea.   It has been a pleasure seeing and caring for you today and I hope you start feeling better soon!

## 2021-01-01 NOTE — Progress Notes (Signed)
Virtual Visit via Video Note  I connected with Sunaina  on 01/01/21 at  1:00 PM EDT by a video enabled telemedicine application and verified that I am speaking with the correct person using two identifiers.  Location patient: home, [place] Location provider:work or home office Persons participating in the virtual visit: patient, provider  I discussed the limitations of evaluation and management by telemedicine and the availability of in person appointments. The patient expressed understanding and agreed to proceed.   HPI:  Acute telemedicine visit for back pain and blood in the urine: -Onset: 3 days ago -Symptoms include: moderate flank/back pain, subjective fever, blood in the urine once -Denies: vomiting, diarrhea -Pertinent past medical history:see below, denies history of similar or hx of UTI -Pertinent medication allergies:No Known Allergies -reports irr periods   ROS: See pertinent positives and negatives per HPI.  Past Medical History:  Diagnosis Date   Gestational diabetes    Gestational hypertension    Medical history non-contributory     Past Surgical History:  Procedure Laterality Date   CESAREAN SECTION N/A 10/26/2019   Procedure: CESAREAN SECTION;  Surgeon: Reva Bores, MD;  Location: [facility];  Service: Obstetrics;  Laterality: N/A;   MANDIBLE SURGERY       Current Outpatient Medications:    amoxicillin-clavulanate (AUGMENTIN) 875-125 MG tablet, Take 1 tablet by mouth every 12 (twelve) hours., Disp: 14 tablet, Rfl: 0   gabapentin (NEURONTIN) 300 MG capsule, Take 1 capsule at bedtime for one week, then increase to 1 capsule twice daily, Disp: 60 capsule, Rfl: 0   Iron, Ferrous Sulfate, 325 (65 Fe) MG TABS, Take 325 mg by mouth daily., Disp: 30 tablet, Rfl:    Vitamin D, Ergocalciferol, (DRISDOL) 1.25 MG (50000 UNIT) CAPS capsule, Take 1 capsule (50,000 Units total) by mouth every 7 (seven) days for 12 doses., Disp: 12 capsule, Rfl: 0  EXAM:  VITALS per patient  if applicable: reports subjective fever  GENERAL: alert, oriented, appears well and in no acute distress, child is screaming throughout the visit  PSYCH/NEURO: pleasant and cooperative, no obvious depression or anxiety, speech and thought processing grossly intact  ASSESSMENT AND PLAN:  Discussed the following assessment and plan:  Flank pain  Gross hematuria  Subjective fever  -we discussed possible serious and likely etiologies, options for evaluation and workup, limitations of telemedicine visit vs in person visit, treatment, treatment risks and precautions. After this discussion advised inperson visit would be best. PCP not available so discussed other options for inperson evaluation and she agrees to go to an [facility] for evaluation today.    I discussed the assessment and treatment plan with the patient. The patient was provided an opportunity to ask questions and all were answered. The patient agreed with the plan and demonstrated an understanding of the instructions.     Terressa Koyanagi, DO

## 2021-01-01 NOTE — ED Provider Notes (Signed)
[facility] EMERGENCY DEPT Provider Note   CSN: [id number] Arrival date & time: 01/01/21  1514     History Chief Complaint  Patient presents with   Flank Pain    Deborah Lawson is a 30 y.o. female who presents to the emergency department for abdominal pain x3 days.  Patient states that she was seen via virtual visit and was recommended to be evaluated in ED. She notes that she has had left-sided lower abdominal pain, and flank pain with 1 episode of hematuria.  Pain not related to eating. No aggravating or alleviating factors. No documented fever at home, but states that she has been feeling warm.  She has not been taking anything at home for pain.  No recent illness or injury. No nausea, vomiting, diarrhea, constipation.  She denies vaginal discharge, bleeding, or pelvic pain.   Flank Pain Associated symptoms include abdominal pain. Pertinent negatives include no chest pain and no shortness of breath.      Past Medical History:  Diagnosis Date   Gestational diabetes    Gestational hypertension    Medical history non-contributory     Patient Active Problem List   Diagnosis Date Noted   Vitamin D deficiency 11/11/2020   Iron deficiency 11/11/2020   Gestational diabetes    Gestational hypertension    Breech presentation of fetus 10/26/2019   Language barrier 10/12/2019   Malpresentation before onset of labor 10/12/2019    Past Surgical History:  Procedure Laterality Date   CESAREAN SECTION N/A 10/26/2019   Procedure: CESAREAN SECTION;  Surgeon: Reva Bores, MD;  Location: [facility] LD ORS;  Service: Obstetrics;  Laterality: N/A;   MANDIBLE SURGERY       OB History     Gravida  3   Para  3   Term  3   Preterm  0   AB  0   Living  3      SAB  0   IAB  0   Ectopic  0   Multiple  0   Live Births  3           Family History  Problem Relation Age of Onset   Hypertension Father     Social History   Tobacco Use   Smoking  status: Never   Smokeless tobacco: Never  Vaping Use   Vaping Use: Never used  Substance Use Topics   Alcohol use: Never   Drug use: Never    Home Medications Prior to Admission medications   Medication Sig Start Date End Date Taking? Authorizing Provider  ibuprofen (ADVIL) 600 MG tablet Take 1 tablet (600 mg total) by mouth every 6 (six) hours as needed. 01/01/21  Yes Latrel Szymczak T, PA-C  amoxicillin-clavulanate (AUGMENTIN) 875-125 MG tablet Take 1 tablet by mouth every 12 (twelve) hours. 12/27/20   Rushie Chestnut, PA-C  gabapentin (NEURONTIN) 300 MG capsule Take 1 capsule at bedtime for one week, then increase to 1 capsule twice daily 12/23/20   Drema Dallas, DO  Iron, Ferrous Sulfate, 325 (65 Fe) MG TABS Take 325 mg by mouth daily. 11/11/20   Philip Aspen, Limmie Patricia, MD  Vitamin D, Ergocalciferol, (DRISDOL) 1.25 MG (50000 UNIT) CAPS capsule Take 1 capsule (50,000 Units total) by mouth every 7 (seven) days for 12 doses. 11/11/20 01/28/21  Philip Aspen, Limmie Patricia, MD  NIFEdipine (PROCARDIA XL) 30 MG 24 hr tablet Take 1 tablet (30 mg total) by mouth daily. 11/05/19 02/18/20  Donette Larry, CNM  Allergies    Patient has no known allergies.  Review of Systems   Review of Systems  Constitutional:  Positive for chills. Negative for fever.  Respiratory:  Negative for shortness of breath.   Cardiovascular:  Negative for chest pain.  Gastrointestinal:  Positive for abdominal pain. Negative for constipation, diarrhea, nausea and vomiting.  Genitourinary:  Positive for flank pain and hematuria. Negative for dysuria, pelvic pain, vaginal bleeding and vaginal discharge.  All other systems reviewed and are negative.  Physical Exam Updated Vital Signs BP 133/86 (BP Location: Right Arm)   Pulse 87   Temp 98.4 F (36.9 C) (Oral)   Resp 18   Ht 5\' 3"  (1.6 m)   Wt 87.1 kg   LMP 12/14/2020   SpO2 100%   BMI 34.01 kg/m   Physical Exam Vitals and nursing note reviewed.   Constitutional:      Appearance: Normal appearance.  HENT:     Head: Normocephalic and atraumatic.  Eyes:     Conjunctiva/sclera: Conjunctivae normal.  Cardiovascular:     Rate and Rhythm: Normal rate and regular rhythm.  Pulmonary:     Effort: Pulmonary effort is normal. No respiratory distress.     Breath sounds: Normal breath sounds.  Abdominal:     General: There is no distension.     Palpations: Abdomen is soft.     Tenderness: There is no abdominal tenderness. There is left CVA tenderness. There is no right CVA tenderness, guarding or rebound.  Skin:    General: Skin is warm and dry.  Neurological:     General: No focal deficit present.     Mental Status: She is alert.    ED Results / Procedures / Treatments   Labs (all labs ordered are listed, but only abnormal results are displayed) Labs Reviewed  URINALYSIS, ROUTINE W REFLEX MICROSCOPIC - Abnormal; Notable for the following components:      Result Value   Color, Urine STRAW (*)    Hgb urine dipstick TRACE (*)    All other components within normal limits  URINALYSIS, MICROSCOPIC (REFLEX) - Abnormal; Notable for the following components:   Bacteria, UA RARE (*)    All other components within normal limits  CBC WITH DIFFERENTIAL/PLATELET - Abnormal; Notable for the following components:   RBC 5.18 (*)    MCV 79.3 (*)    MCH 25.3 (*)    Platelets 455 (*)    Lymphs Abs 4.4 (*)    All other components within normal limits  PREGNANCY, URINE  BASIC METABOLIC PANEL    EKG None  Radiology CT Renal Stone Study  Result Date: 01/01/2021 CLINICAL DATA:  Flank pain, kidney stone suspected. EXAM: CT ABDOMEN AND PELVIS WITHOUT CONTRAST TECHNIQUE: Multidetector CT imaging of the abdomen and pelvis was performed following the standard protocol without IV contrast. COMPARISON:  06/10/2020 FINDINGS: Lower chest: Lung bases are clear. Hepatobiliary: Normal appearance of the liver. Again noted is a small density in the  gallbladder that could represent a small stone. No gallbladder distension. Pancreas: Unremarkable. No pancreatic ductal dilatation or surrounding inflammatory changes. Spleen: Normal in size without focal abnormality. Adrenals/Urinary Tract: Normal adrenal glands. Motion artifact involving the kidneys. Negative for kidney stones or hydronephrosis. Normal appearance of the urinary bladder. Stomach/Bowel: Stomach is within normal limits. Appendix appears normal. No evidence of bowel wall thickening, distention, or inflammatory changes. Vascular/Lymphatic: No significant vascular findings are present. No enlarged abdominal or pelvic lymph nodes. Reproductive: Uterus and bilateral adnexa are unremarkable.  Other: No ascites. Negative for free air. Mild laxity along the anterior abdominal wall near the umbilicus is unchanged. Musculoskeletal: No acute bone abnormality. IMPRESSION: 1. No acute abnormality in the abdomen or pelvis. 2. No evidence for kidney stones or hydronephrosis. 3. Probable cholelithiasis. No evidence for gallbladder inflammation or distension. Electronically Signed   By: Richarda Overlie M.D.   On: 01/01/2021 17:24    Procedures Procedures   Medications Ordered in ED Medications  acetaminophen (TYLENOL) tablet 650 mg (650 mg Oral Given 01/01/21 1719)  ketorolac (TORADOL) 30 MG/ML injection 30 mg (30 mg Intravenous Given 01/01/21 1842)    ED Course  I have reviewed the triage vital signs and the nursing notes.  Pertinent labs & imaging results that were available during my care of the patient were reviewed by me and considered in my medical decision making (see chart for details).    MDM Rules/Calculators/A&P                           Patient is 30 y/o female who presents to the ED for abdominal pain and flank pain x 3 days. Had one episode of hematuria several days ago. Has not taken anything for her symptoms.   The differential diagnosis of emergent flank pain includes, but is not  limited to: AAA, mesenteric ischemia, pyelonephritis, nephrolithiasis, cystitis, biliary colic, appendicitis, diverticulitis, bowel obstruction.   On exam patient is afebrile, she is not toxic appearing.  Abdomen is soft, non-tender and non-distended, no guarding or rigidity. She does have left CVA tenderness, none on the right.   Lab work shows no leukocytosis, no electrolyte abnormalities. Urinalysis with trace hematuria. Pregnancy test negative, not concerned for ectopic pregnancy. CT renal study showed no nephrolithiasis or hydronephrosis.   Low concern for acute infection as there is no leukocytosis and urinalysis negative for UTI. Symptoms not related to food, lesser concern for biliary colic. No diarrhea or constipation increasing my suspicion for gastroenteritis or bowel obstruction. Patient is otherwise healthy and at low risk for AAA or ischemia. On reevaluation patient states pain has improved. Abdomen remains nonsurgical.   Patient's symptoms likely related to painful hematuria after passing kidney stone. Plan to treat with anti-inflammatories and encourage follow up with PCP for urine recheck. Discussed reasons to return to ED. Patient agreeable to plan.   Final Clinical Impression(s) / ED Diagnoses Final diagnoses:  Painful hematuria    Rx / DC Orders ED Discharge Orders          Ordered    ibuprofen (ADVIL) 600 MG tablet  Every 6 hours PRN        01/01/21 1838             Su Monks, PA-C 01/01/21 1850    Ernie Avena, MD 01/01/21 2343

## 2021-01-01 NOTE — ED Triage Notes (Signed)
Reports bil flank pain with blood in urine for the last three day.

## 2021-01-09 ENCOUNTER — Ambulatory Visit (HOSPITAL_COMMUNITY)
Admission: EM | Admit: 2021-01-09 | Discharge: 2021-01-09 | Disposition: A | Discharge status: Home / Self Care | Payer: Medicaid Other | Attending: Emergency Medicine | Admitting: Emergency Medicine

## 2021-01-09 ENCOUNTER — Encounter (HOSPITAL_COMMUNITY): Payer: Self-pay | Admitting: Emergency Medicine

## 2021-01-09 ENCOUNTER — Other Ambulatory Visit: Payer: Self-pay

## 2021-01-09 DIAGNOSIS — M25561 Pain in right knee: Secondary | ICD-10-CM

## 2021-01-09 DIAGNOSIS — M25562 Pain in left knee: Secondary | ICD-10-CM

## 2021-01-09 MED ORDER — IBUPROFEN 800 MG PO TABS: 800.0000 mg | ORAL_TABLET | Freq: Three times a day (TID) | ORAL | 0 refills | Status: DC

## 2021-01-09 NOTE — ED Provider Notes (Signed)
[facility]    CSN: [id number] Arrival date & time: 01/09/21  1103      History   Chief Complaint Chief Complaint  Patient presents with   Knee Pain    HPI Deborah Lawson is a 30 y.o. female.   HPI  Knee Pain: Pt reports that she fell a few days ago. Since this time she has had bilateral anterior knee pain. Pain mainly occurs when she kneels on the ground. She has tried occasional non-prescription strength ibuprofen for symptoms with some relief. She denies instability, bruising, pain with walking. Of note she is breastfeeding.   Past Medical History:  Diagnosis Date   Gestational diabetes    Gestational hypertension    Medical history non-contributory     Patient Active Problem List   Diagnosis Date Noted   Vitamin D deficiency 11/11/2020   Iron deficiency 11/11/2020   Gestational diabetes    Gestational hypertension    Breech presentation of fetus 10/26/2019   Language barrier 10/12/2019   Malpresentation before onset of labor 10/12/2019    Past Surgical History:  Procedure Laterality Date   CESAREAN SECTION N/A 10/26/2019   Procedure: CESAREAN SECTION;  Surgeon: Reva Bores, MD;  Location: [facility]S;  Service: Obstetrics;  Laterality: N/A;   MANDIBLE SURGERY      OB History     Gravida  3   Para  3   Term  3   Preterm  0   AB  0   Living  3      SAB  0   IAB  0   Ectopic  0   Multiple  0   Live Births  3            Home Medications    Prior to Admission medications   Medication Sig Start Date End Date Taking? Authorizing Provider  amoxicillin-clavulanate (AUGMENTIN) 875-125 MG tablet Take 1 tablet by mouth every 12 (twelve) hours. 12/27/20   Rushie Chestnut, PA-C  gabapentin (NEURONTIN) 300 MG capsule Take 1 capsule at bedtime for one week, then increase to 1 capsule twice daily 12/23/20   Drema Dallas, DO  ibuprofen (ADVIL) 600 MG tablet Take 1 tablet (600 mg total) by mouth every 6 (six) hours as needed.  01/01/21   Roemhildt, Lorin T, PA-C  Iron, Ferrous Sulfate, 325 (65 Fe) MG TABS Take 325 mg by mouth daily. 11/11/20   Philip Aspen, Limmie Patricia, MD  Vitamin D, Ergocalciferol, (DRISDOL) 1.25 MG (50000 UNIT) CAPS capsule Take 1 capsule (50,000 Units total) by mouth every 7 (seven) days for 12 doses. 11/11/20 01/28/21  Philip Aspen, Limmie Patricia, MD  NIFEdipine (PROCARDIA XL) 30 MG 24 hr tablet Take 1 tablet (30 mg total) by mouth daily. 11/05/19 02/18/20  Donette Larry, CNM    Family History Family History  Problem Relation Age of Onset   Hypertension Father     Social History Social History   Tobacco Use   Smoking status: Never   Smokeless tobacco: Never  Vaping Use   Vaping Use: Never used  Substance Use Topics   Alcohol use: Never   Drug use: Never     Allergies   Patient has no known allergies.   Review of Systems Review of Systems  As stated above in HPI Physical Exam Triage Vital Signs ED Triage Vitals [01/09/21 1332]  Enc Vitals Group     BP 119/84     Pulse Rate 98  Resp 18     Temp 98.4 F (36.9 C)     Temp Source Oral     SpO2 97 %     Weight      Height      Head Circumference      Peak Flow      Pain Score      Pain Loc      Pain Edu?      Excl. in GC?    No data found.  Updated Vital Signs BP 119/84 (BP Location: Left Arm)   Pulse 98   Temp 98.4 F (36.9 C) (Oral)   Resp 18   LMP 12/14/2020   SpO2 97%   Physical Exam Vitals and nursing note reviewed.  Constitutional:      General: She is not in acute distress.    Appearance: Normal appearance. She is not ill-appearing, toxic-appearing or diaphoretic.  Musculoskeletal:        General: No swelling or tenderness. Normal range of motion.     Right knee: Normal. No swelling, deformity, effusion, erythema, ecchymosis, bony tenderness or crepitus. Normal range of motion. No tenderness. No LCL laxity, MCL laxity, ACL laxity or PCL laxity. Normal alignment, normal meniscus and normal  patellar mobility. Normal pulse.     Instability Tests: Anterior drawer test negative. Posterior drawer test negative. Anterior Lachman test negative. Medial McMurray test negative and lateral McMurray test negative.     Left knee: Normal. No swelling, deformity, effusion, erythema, ecchymosis, bony tenderness or crepitus. Normal range of motion. No tenderness. No LCL laxity, MCL laxity, ACL laxity or PCL laxity.Normal alignment, normal meniscus and normal patellar mobility. Normal pulse.     Instability Tests: Anterior drawer test negative. Posterior drawer test negative. Anterior Lachman test negative. Medial McMurray test negative and lateral McMurray test negative.     Right lower leg: No edema.     Left lower leg: No edema.  Neurological:     Mental Status: She is alert.     UC Treatments / Results  Labs (all labs ordered are listed, but only abnormal results are displayed) Labs Reviewed - No data to display  EKG   Radiology No results found.  Procedures Procedures (including critical care time)  Medications Ordered in UC Medications - No data to display  Initial Impression / Assessment and Plan / UC Course  I have reviewed the triage vital signs and the nursing notes.  Pertinent labs & imaging results that were available during my care of the patient were reviewed by me and considered in my medical decision making (see chart for details).     New.  Unfortunately other NSAIDs such as Mobic, naproxen and Lodine are all ill-advised for lactation so I am going to send in prescription strength ibuprofen for patient.  We discussed heating or ice packs and avoidance of activities that cause pain.  Should symptoms fail to improve over the next 2 weeks I am recommending that she see orthopedics.  At this time given her examination findings and the fact that she is able to easily ambulate on her own I would not recommend x-ray imaging. Final Clinical Impressions(s) / UC Diagnoses    Final diagnoses:  None   Discharge Instructions   None    ED Prescriptions   None    PDMP not reviewed this encounter.   Rushie Chestnut, [place] 01/09/21 1359

## 2021-01-09 NOTE — ED Triage Notes (Signed)
Pt c/o bilat knee pain after fall couple days ago.

## 2021-01-19 NOTE — Progress Notes (Unsigned)
Dental Clarence received from [facility]. Dental.

## 2021-01-26 ENCOUNTER — Other Ambulatory Visit: Payer: Self-pay | Admitting: Internal Medicine

## 2021-01-26 DIAGNOSIS — E559 Vitamin D deficiency, unspecified: Secondary | ICD-10-CM

## 2021-02-09 ENCOUNTER — Other Ambulatory Visit: Payer: Self-pay | Admitting: Neurology

## 2021-02-12 ENCOUNTER — Telehealth: Payer: Self-pay | Admitting: Internal Medicine

## 2021-02-12 ENCOUNTER — Ambulatory Visit: Payer: Medicaid Other | Attending: Neurology | Admitting: Physical Therapy

## 2021-02-12 ENCOUNTER — Encounter: Payer: Self-pay | Admitting: Physical Therapy

## 2021-02-12 ENCOUNTER — Other Ambulatory Visit: Payer: Self-pay

## 2021-02-12 DIAGNOSIS — M26609 Unspecified temporomandibular joint disorder, unspecified side: Secondary | ICD-10-CM | POA: Insufficient documentation

## 2021-02-12 DIAGNOSIS — M542 Cervicalgia: Secondary | ICD-10-CM | POA: Insufficient documentation

## 2021-02-12 NOTE — Therapy (Signed)
[facility] [address] [place], [place], [address] Phone: [phone]   Fax:  [phone]  Physical Therapy Evaluation  Patient Details  Name: Deborah Lawson MRN: [id number] Date of Birth: Dec 25, 1990 Referring Provider (PT): Drema Dallas, DO   Encounter Date: 02/12/2021   PT End of Session - 02/12/21 1233     Visit Number 1    Number of Visits 9    Date for PT Re-Evaluation 04/09/21    Authorization Type UHC MCD    PT Start Time 1225    PT Stop Time 1310    PT Time Calculation (min) 45 min    Activity Tolerance Patient tolerated treatment well    Behavior During Therapy [facility] for tasks assessed/performed             Past Medical History:  Diagnosis Date   Gestational diabetes    Gestational hypertension    Medical history non-contributory     Past Surgical History:  Procedure Laterality Date   CESAREAN SECTION N/A 10/26/2019   Procedure: CESAREAN SECTION;  Surgeon: Reva Bores, MD;  Location: [facility] LD [facility]S;  Service: Obstetrics;  Laterality: N/A;   MANDIBLE SURGERY      There were no vitals filed for this visit.    Subjective Assessment - 02/12/21 1241     Subjective pt reports the pain started on the L shoulder / neck that started about 3 months ago. pain started with a migraine and will flucutate. She denies hx or neck or shoulder issues prior to onset.  she states the pain is 6/10 and has issues occasionally with taking a deep breath denies any N/T.    How long can you sit comfortably? n/a    How long can you stand comfortably? n/a    How long can you walk comfortably? n/a    Patient Stated Goals to feel better.    Currently in Pain? 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 1    Has the patient had a decrease in activity level because of a fear of falling?  No    Is the patient reluctant to leave their home because of a fear of falling?  No      Home [employer] information [employer] manager residence      Prior Function   Level of Independence Independent with basic ADLs    Vocation Unemployed  Cognition   Overall Cognitive Status Within Functional Limits for tasks assessed      Observation/Other Assessments   Focus on Therapeutic Outcomes (FOTO)  MCD      Posture/Postural Control   Posture/Postural Control Postural limitations    Postural Limitations Rounded Shoulders;Forward head      ROM / Strength   AROM / PROM / Strength AROM;Strength      AROM   Overall AROM Comments shoulder WFL but did not pain with horizontal adduction and ER    AROM Assessment Site Cervical    Cervical Flexion 50   mild concordant pain noted during testing   Cervical Extension 58   mild concordant pain noted during testing   Cervical - Right Side Bend 38   L sided concordant pain noted   Cervical - Left Side Bend 52    Cervical - Right Rotation 54    Cervical - Left Rotation 44   concordatn pain during motion     Strength   Overall Strength Comments limited effort given requiring multiple verbal cues for proper form    Strength Assessment Site Shoulder    Right/Left Shoulder Right;Left    Right Shoulder Flexion 4-/5    Right Shoulder Extension 4-/5     Right Shoulder ABduction 4-/5    Right Shoulder Internal Rotation 4-/5    Right Shoulder External Rotation 4-/5    Left Shoulder Flexion 4-/5    Left Shoulder Extension 4-/5    Left Shoulder ABduction 4-/5    Left Shoulder Internal Rotation 4-/5    Left Shoulder External Rotation 4-/5      Palpation   Palpation comment TTP along the L upper trap, levator scapulae, L sided cervical paraspinals , and L rhomboids with trigger points noted.                        Objective measurements completed on examination: See above findings.                  PT Short Term Goals - 02/12/21 1325       PT SHORT TERM GOAL #1   Title pt to be IND with intial HEP    Baseline no previous HEP    Time 4    Period Weeks    Status New    Target Date 03/12/21               PT Long Term Goals - 02/12/21 1325       PT LONG TERM GOAL #1   Title pt to verbalize/ demo efficient posture and lifting mechanics to reduce and prevent L shoulder pain    Baseline no previous knowledge of posture and lifting mechanics    Time 8    Period Weeks    Status New    Target Date 04/09/21      PT LONG TERM GOAL #2   Title increase gross shouler strength to >/= 4/5 with no report of pain during testing.    Baseline see flowsheet    Time 8    Period Weeks    Status New    Target Date 04/09/21      PT LONG TERM GOAL #3   Title pt to report </=2/10 max pain to demo improvement in function    Baseline 6/10 pain today    Time 8    Period Weeks    Status New    Target Date 04/09/21  PT LONG TERM GOAL #4   Title increase L rotation and R sidebending by >/= 10 degrees with max pain or </= 2/10 for safety with drivign    Baseline see flowsheet    Time 8    Period Weeks    Status New    Target Date 04/09/21      PT LONG TERM GOAL #5   Title pt to be IND with all HEP to be able to maintain and progress current LOF IND    Time 8    Period Weeks    Status New    Target  Date 04/09/21                    Plan - 02/12/21 1319     Clinical Impression Statement pt arrives to OPP[facility] with CC of L shoulder/ neck pain that started 3 months ago beginning with a migraine but had no specific MOI. Her referral included a Dx of TMJ but pt denied any issues with her Jaw. She has functional shoulder ROM with report of concordant symptoms with horizontal abduction and ER, and concordant findings with cervical flexion/ extension, R sidebending and L rotation. limited reliabiltiy with MMT secondary to frequent cueing needed in combination with limited effort noted. TTP noted at the L upper trap/ levator scapuale, rhomboids with multiple trigger points noted. She would benefit from physical therapy to decrease L shoulder pain, reduce muscle tension, improve lifting/ carrying biomechanics of her child, and maximize her function by addressing the deficits listed.    Personal Factors and Comorbidities Comorbidity 1    Comorbidities hx of DM    Stability/Clinical Decision Making Stable/Uncomplicated    Clinical Decision Making Low    Rehab Potential Good    PT Frequency 1x / week    PT Duration 8 weeks    PT Treatment/Interventions ADLs/Self Care Home Management;Cryotherapy;Iontophoresis 4mg /ml Dexamethasone;Traction;Moist Heat;Functional mobility training;Therapeutic activities;Electrical Stimulation;Therapeutic exercise;Neuromuscular re-education;Patient/family education;Manual techniques;Passive range of motion;Dry needling;Taping    PT Next Visit Plan review/ update HEP PRN, STW ( may try to discuss DN) along the L upper trap/ levator scapula/ cervical paraspinals and sub-occipitals , posture and lifting mechanics (as it relates to lifting her child), posterior shoulder strengthening.    PT Home Exercise Plan GHCDRETD - upper trap stretch, levator scapulae stretch, chin tuck (seated), scapular retraction    Consulted and Agree with Plan of Care Patient              Patient will benefit from skilled therapeutic intervention in order to improve the following deficits and impairments:  Improper body mechanics, Increased muscle spasms, Decreased strength, Postural dysfunction, Pain, Decreased activity tolerance  Visit Diagnosis: TMJ dysfunction  Cervicalgia     Problem List Patient Active Problem List   Diagnosis Date Noted   Vitamin D deficiency 11/11/2020   Iron deficiency 11/11/2020   Gestational diabetes    Gestational hypertension    Breech presentation of fetus 10/26/2019   Language barrier 10/12/2019   Malpresentation before onset of labor 10/12/2019     [facility] [address] [place], [place], [place] Phone: [phone]   Fax:  [phone]  Name: Dameka Younker MRN: Ellis Parents Date of Birth: 05-17-1990   05/15/1990 PT, DPT, LAT, ATC  02/12/21  1:32 PM       Check all possible CPT codes: 97110- Therapeutic Exercise, [phone]- Neuro Re-education, [phone] - Gait Training, 97140 - Manual Therapy, 97530 - Therapeutic Activities, [phone] -  Self Care, 69629 - Electrical stimulation (unattended), [id number] - Iontophoresis, [id number] - Ultrasound, and [id number] - Physical performance training

## 2021-02-12 NOTE — Telephone Encounter (Signed)
Pt states she has been having trouble breathing x 3weeks. Pt states "sometimes when I left my head up it's hard to breathe" Pt states this is intermittent & does not currently feel SOB. Denies cough, congestion, fever or h/a. Pt advised that if she is not able to catch her breath for longer episodes before she see Dr Ardyth Harps on 02/12/21 then she should go to ED immediately. Pt verb understanding & denies further ques/concerns at this time.

## 2021-02-12 NOTE — Telephone Encounter (Signed)
Patient states through MyChart message that "sometimes she can't breathe the air."  Patient doesn't speak English--speaks Arabic, [place].  Patient has an appt on 02/17/21, however wasn't sure if she needed to be seen sooner.

## 2021-02-13 ENCOUNTER — Ambulatory Visit (INDEPENDENT_AMBULATORY_CARE_PROVIDER_SITE_OTHER): Payer: Medicaid Other | Admitting: Internal Medicine

## 2021-02-13 VITALS — BP 110/80 | HR 95 | Temp 98.2°F | Wt 194.4 lb

## 2021-02-13 DIAGNOSIS — Z9109 Other allergy status, other than to drugs and biological substances: Secondary | ICD-10-CM

## 2021-02-13 DIAGNOSIS — R079 Chest pain, unspecified: Secondary | ICD-10-CM

## 2021-02-13 DIAGNOSIS — E611 Iron deficiency: Secondary | ICD-10-CM | POA: Diagnosis not present

## 2021-02-13 DIAGNOSIS — E559 Vitamin D deficiency, unspecified: Secondary | ICD-10-CM

## 2021-02-13 DIAGNOSIS — R0602 Shortness of breath: Secondary | ICD-10-CM

## 2021-02-13 DIAGNOSIS — Z23 Encounter for immunization: Secondary | ICD-10-CM | POA: Diagnosis not present

## 2021-02-13 NOTE — Progress Notes (Signed)
Acute office Visit     This visit occurred during the SARS-CoV-2 public health emergency.  Safety protocols were in place, including screening questions prior to the visit, additional usage of staff PPE, and extensive cleaning of exam room while observing appropriate contact time as indicated for disinfecting solutions.    CC/Reason for Visit: Shortness of breath, watery, itchy eyes  HPI: Deborah Lawson is a 30 y.o. female who is coming in today for the above mentioned reasons.  She states that for about the past month she has noticed that while at home she will experience some dry, itchy eyes and mild shortness of breath.  This all started happening after she uncovered a carpet in her house.  She noticed that as soon as she leaves the house and gets some "fresh air" her symptoms rapidly improve.  She has not tried any over-the-counter medication.  This visit is performed with the aid of a translator as she does not speak [place].  Past Medical/Surgical History: Past Medical History:  Diagnosis Date   Gestational diabetes    Gestational hypertension    Medical history non-contributory     Past Surgical History:  Procedure Laterality Date   CESAREAN SECTION N/A 10/26/2019   Procedure: CESAREAN SECTION;  Surgeon: Reva Bores, MD;  Location: [facility] LD [facility]S;  Service: Obstetrics;  Laterality: N/A;   MANDIBLE SURGERY      Social History:  reports that she has never smoked. She has never used smokeless tobacco. She reports that she does not drink alcohol and does not use drugs.  Allergies: No Known Allergies  Family History:  Family History  Problem Relation Age of Onset   Hypertension Father      Current Outpatient Medications:    gabapentin (NEURONTIN) 300 MG capsule, 1 capsule twice daily, Disp: 60 capsule, Rfl: 5   ibuprofen (ADVIL) 800 MG tablet, Take 1 tablet (800 mg total) by mouth 3 (three) times daily., Disp: 21 tablet, Rfl: 0   Iron, Ferrous Sulfate, 325 (65  Fe) MG TABS, Take 325 mg by mouth daily., Disp: 30 tablet, Rfl:   Review of Systems:  Constitutional: Denies fever, chills, diaphoresis, appetite change and fatigue.  HEENT: Denies photophobia, eye pain, redness, hearing loss, ear pain, congestion, sore throat, rhinorrhea, sneezing, mouth sores, trouble swallowing, neck pain, neck stiffness and tinnitus.   Respiratory: Denies  chest tightness,  and wheezing.   Cardiovascular: Denies chest pain, palpitations and leg swelling.  Gastrointestinal: Denies nausea, vomiting, abdominal pain, diarrhea, constipation, blood in stool and abdominal distention.  Genitourinary: Denies dysuria, urgency, frequency, hematuria, flank pain and difficulty urinating.  Endocrine: Denies: hot or cold intolerance, sweats, changes in hair or nails, polyuria, polydipsia. Musculoskeletal: Denies myalgias, back pain, joint swelling, arthralgias and gait problem.  Skin: Denies pallor, rash and wound.  Neurological: Denies dizziness, seizures, syncope, weakness, light-headedness, numbness and headaches.  Hematological: Denies adenopathy. Easy bruising, personal or family bleeding history  Psychiatric/Behavioral: Denies suicidal ideation, mood changes, confusion, nervousness, sleep disturbance and agitation    Physical Exam: Vitals:   02/13/21 1500  BP: 110/80  Pulse: 95  Temp: 98.2 F (36.8 C)  TempSrc: Oral  SpO2: 99%  Weight: 194 lb 6.4 oz (88.2 kg)    Body mass index is 34.44 kg/m.   Constitutional: NAD, calm, comfortable Eyes: PERRL, lids and conjunctivae normal ENMT: Mucous membranes are moist.  Respiratory: clear to auscultation bilaterally, no wheezing, no crackles. Normal respiratory effort. No accessory muscle use.  Cardiovascular:  Regular rate and rhythm, no murmurs / rubs / gallops. No extremity edema.  Psychiatric: Normal judgment and insight. Alert and oriented x 3. Normal mood.    Impression and Plan:  SOB (shortness of breath) - Plan: EKG  12-Lead  Chest pain, unspecified type - Plan: EKG 12-Lead  Environmental allergies  -EKG was done before I evaluated patient due to concerns of shortness of breath.  EKG was interpreted by myself as normal sinus rhythm at a rate of 90, normal axis, no acute ST or T wave changes. -I suspect that her symptoms are likely due to environmental allergies, possibly related to the carpet in her house that she has uncovered. -Have advised use of daily antihistamine for now, can consider referral to allergy if symptoms persist.  Time spent: 22 minutes reviewing chart, interviewing and examining patient and formulating plan of care.   Patient Instructions  -Nice seeing you today!!  -Try zyrtec 1 tablet daily. If no improvement in 3-4 weeks, please reach out to me.    Lelon Frohlich, MD [facility]

## 2021-02-13 NOTE — Addendum Note (Signed)
Addended by: Kern Reap B on: 02/13/2021 03:57 PM   Modules accepted: Orders

## 2021-02-13 NOTE — Patient Instructions (Signed)
-  Nice seeing you today!!  -Try zyrtec 1 tablet daily. If no improvement in 3-4 weeks, please reach out to me.

## 2021-02-13 NOTE — Addendum Note (Signed)
Addended by: Marian Sorrow D on: 02/13/2021 03:47 PM   Modules accepted: Orders

## 2021-02-14 ENCOUNTER — Other Ambulatory Visit: Payer: Self-pay | Admitting: Internal Medicine

## 2021-02-14 DIAGNOSIS — E559 Vitamin D deficiency, unspecified: Secondary | ICD-10-CM

## 2021-02-14 LAB — CBC WITH DIFFERENTIAL/PLATELET
Absolute Monocytes: 552 cells/uL (ref 200–950)
Basophils Absolute: 40 cells/uL (ref 0–200)
Basophils Relative: 0.5 %
Eosinophils Absolute: 88 cells/uL (ref 15–500)
Eosinophils Relative: 1.1 %
HCT: 42 % (ref 35.0–45.0)
Hemoglobin: 13.5 g/dL (ref 11.7–15.5)
Lymphs Abs: 3960 cells/uL — ABNORMAL HIGH (ref 850–3900)
MCH: 26 pg — ABNORMAL LOW (ref 27.0–33.0)
MCHC: 32.1 g/dL (ref 32.0–36.0)
MCV: 80.8 fL (ref 80.0–100.0)
MPV: 10.1 fL (ref 7.5–12.5)
Monocytes Relative: 6.9 %
Neutro Abs: 3360 cells/uL (ref 1500–7800)
Neutrophils Relative %: 42 %
Platelets: 441 10*3/uL — ABNORMAL HIGH (ref 140–400)
RBC: 5.2 10*6/uL — ABNORMAL HIGH (ref 3.80–5.10)
RDW: 14.3 % (ref 11.0–15.0)
Total Lymphocyte: 49.5 %
WBC: 8 10*3/uL (ref 3.8–10.8)

## 2021-02-14 LAB — VITAMIN D 25 HYDROXY (VIT D DEFICIENCY, FRACTURES): Vit D, 25-Hydroxy: 42 ng/mL (ref 30–100)

## 2021-02-16 ENCOUNTER — Encounter: Payer: Self-pay | Admitting: Internal Medicine

## 2021-02-17 ENCOUNTER — Ambulatory Visit: Payer: Medicaid Other | Admitting: Internal Medicine

## 2021-02-23 ENCOUNTER — Ambulatory Visit: Payer: Medicaid Other | Admitting: Physical Therapy

## 2021-02-23 ENCOUNTER — Other Ambulatory Visit: Payer: Self-pay

## 2021-02-23 ENCOUNTER — Encounter: Payer: Self-pay | Admitting: Physical Therapy

## 2021-02-23 DIAGNOSIS — M26609 Unspecified temporomandibular joint disorder, unspecified side: Secondary | ICD-10-CM | POA: Diagnosis not present

## 2021-02-23 DIAGNOSIS — M542 Cervicalgia: Secondary | ICD-10-CM

## 2021-02-23 NOTE — Therapy (Signed)
[facility] [address] [place], [place], [address] Phone: [phone]   Fax:  [phone]  Physical Therapy Treatment  Patient Details  Name: Deborah Lawson MRN: [id number] Date of Birth: 1990-05-31 Referring Provider (PT): Drema Dallas, DO   Encounter Date: 02/23/2021   PT End of Session - 02/23/21 1152     Visit Number 2    Number of Visits 9    Date for PT Re-Evaluation 04/09/21    Authorization Type UHC MCD- 27 visit    PT Start Time 1145    PT Stop Time 1223    PT Time Calculation (min) 38 min             Past Medical History:  Diagnosis Date   Gestational diabetes    Gestational hypertension    Medical history non-contributory     Past Surgical History:  Procedure Laterality Date   CESAREAN SECTION N/A 10/26/2019   Procedure: CESAREAN SECTION;  Surgeon: Reva Bores, MD;  Location: [facility] LD ORS;  Service: Obstetrics;  Laterality: N/A;   MANDIBLE SURGERY      There were no vitals filed for this visit.   Subjective Assessment - 02/23/21 1148     Subjective Sometimes I do the exercise. I do think the exercises are helpful.    Currently in Pain? Yes    Pain Score 8     Pain Location Neck    Pain Orientation Left    Pain Descriptors / Indicators Aching;Sore    Pain Type Chronic pain    Pain Radiating Towards left shoulder, left chest , under arm.            [facility] Adult PT Treatment/Exercise:  Therapeutic Exercise: - Seated AROM cervical, side bend, rotations and flex/ext x 5 each -seated levator stretch 10 sec x 5 each with cues for posture.  - seated scap squeeze x 10  -seated red band scap retract with shoulder ER  -corner stretch  -seated horizontal abduction red band 10 x2  - seated chin tuck x 10          PT Short Term Goals - 02/12/21 1325       PT SHORT TERM GOAL #1   Title pt to be IND with intial HEP    Baseline no previous HEP    Time 4    Period Weeks    Status  New    Target Date 03/12/21               PT Long Term Goals - 02/12/21 1325       PT LONG TERM GOAL #1   Title pt to verbalize/ demo efficient posture and lifting mechanics to reduce and prevent L shoulder pain    Baseline no previous knowledge of posture and lifting mechanics    Time 8    Period Weeks    Status New    Target Date 04/09/21      PT LONG TERM GOAL #2   Title increase gross shouler strength to >/= 4/5 with no report of pain during testing.    Baseline see flowsheet    Time 8    Period Weeks    Status New    Target Date 04/09/21      PT LONG TERM GOAL #3   Title pt to report </=2/10 max pain to demo improvement in function    Baseline 6/10 pain today    Time 8    Period  Weeks    Status New    Target Date 04/09/21      PT LONG TERM GOAL #4   Title increase L rotation and R sidebending by >/= 10 degrees with max pain or </= 2/10 for safety with drivign    Baseline see flowsheet    Time 8    Period Weeks    Status New    Target Date 04/09/21      PT LONG TERM GOAL #5   Title pt to be IND with all HEP to be able to maintain and progress current LOF IND    Time 8    Period Weeks    Status New    Target Date 04/09/21                   Plan - 02/23/21 1151     Clinical Impression Statement Pt reports pain is in her left chest, breast, axilla, posterior shoulder and rates the pain at 8/10. She reports some compliance with HEP and reported increased pain with some HEP. Continued with cervical AROM and scap stabilization. She tolerated the session well. Her HEP was updated and  questions abswered via interpreter.    PT Treatment/Interventions ADLs/Self Care Home Management;Cryotherapy;Iontophoresis 4mg /ml Dexamethasone;Traction;Moist Heat;Functional mobility training;Therapeutic activities;Electrical Stimulation;Therapeutic exercise;Neuromuscular re-education;Patient/family education;Manual techniques;Passive range of motion;Dry needling;Taping     PT Next Visit Plan review/ update HEP PRN, STW ( may try to discuss DN) along the L upper trap/ levator scapula/ cervical paraspinals and sub-occipitals , posture and lifting mechanics (as it relates to lifting her child), posterior shoulder strengthening.    PT Home Exercise Plan GHCDRETD - upper trap stretch, levator scapulae stretch, chin tuck (seated), scapular retraction             Patient will benefit from skilled therapeutic intervention in order to improve the following deficits and impairments:  Improper body mechanics, Increased muscle spasms, Decreased strength, Postural dysfunction, Pain, Decreased activity tolerance  Visit Diagnosis: TMJ dysfunction  Cervicalgia     Problem List Patient Active Problem List   Diagnosis Date Noted   Vitamin D deficiency 11/11/2020   Iron deficiency 11/11/2020   Gestational diabetes    Gestational hypertension    Breech presentation of fetus 10/26/2019   Language barrier 10/12/2019   Malpresentation before onset of labor 10/12/2019    10/14/2019, PTA 02/23/2021, 12:41 PM  [facility] [address] [place], [place], [place] Phone: [phone]   Fax:  [phone]  Name: Deborah Lawson MRN: Ellis Parents Date of Birth: 11/30/1990

## 2021-03-02 ENCOUNTER — Ambulatory Visit: Payer: Medicaid Other | Admitting: Physical Therapy

## 2021-03-02 ENCOUNTER — Encounter: Payer: Self-pay | Admitting: Physical Therapy

## 2021-03-02 ENCOUNTER — Other Ambulatory Visit: Payer: Self-pay

## 2021-03-02 DIAGNOSIS — M26609 Unspecified temporomandibular joint disorder, unspecified side: Secondary | ICD-10-CM | POA: Diagnosis not present

## 2021-03-02 DIAGNOSIS — M542 Cervicalgia: Secondary | ICD-10-CM

## 2021-03-02 NOTE — Therapy (Signed)
[facility] [address] [place], [place], [address] Phone: [phone]   Fax:  [phone]  Physical Therapy Treatment  Patient Details  Name: Deborah Lawson MRN: [id number] Date of Birth: 29-Jun-1990 Referring Provider (PT): Drema Dallas, DO   Encounter Date: 03/02/2021   PT End of Session - 03/02/21 1107     Visit Number 3    Number of Visits 9    Date for PT Re-Evaluation 04/09/21    Authorization Type [employer] MCD- 27 visit    PT Start Time 1100    PT Stop Time 1125    PT Time Calculation (min) 25 min             Past Medical History:  Diagnosis Date   Gestational diabetes    Gestational hypertension    Medical history non-contributory     Past Surgical History:  Procedure Laterality Date   CESAREAN SECTION N/A 10/26/2019   Procedure: CESAREAN SECTION;  Surgeon: Reva Bores, MD;  Location: [facility];  Service: Obstetrics;  Laterality: N/A;   MANDIBLE SURGERY      There were no vitals filed for this visit.   Subjective Assessment - 03/02/21 1104     Subjective Neck pain is much better but I feel pain still in my left chest and left upper back. The neck and top of shoulder are better.    Currently in Pain? Yes    Pain Score 8     Pain Location Chest    Pain Orientation Left    Pain Descriptors / Indicators Pressure;Tightness    Pain Type Chronic pain    Pain Radiating Towards left scapula/upper back    Aggravating Factors  carrying my baby    Pain Relieving Factors ibuprofen             [facility] Adult PT Treatment/Exercise:  Therapeutic Exercise: - Seated AROM cervical, side bend, rotations and flex/ext x 5 each -seated levator stretch 10 sec x 5 each with cues for posture (not today)  - seated scap squeeze x 10 ( not today) -seated red band scap retract with shoulder ER -progressed to green -corner stretch x 3  -seated horizontal abduction red band 10 x2 -progressed to green  - seated chin  tuck x 10 (not today)  -standing green band row x 15 -standing wall push up x 10     [facility] PT Assessment - 03/02/21 0001       AROM   Cervical - Right Rotation 60    Cervical - Left Rotation 60                      PT Short Term Goals - 02/12/21 1325       PT SHORT TERM GOAL #1   Title pt to be IND with intial HEP    Baseline no previous HEP    Time 4    Period Weeks    Status New    Target Date 03/12/21               PT Long Term Goals - 02/12/21 1325       PT LONG TERM GOAL #1   Title pt to verbalize/ demo efficient posture and lifting mechanics to reduce and prevent L shoulder pain    Baseline no previous knowledge of posture and lifting mechanics    Time 8    Period Weeks    Status New  Target Date 04/09/21      PT LONG TERM GOAL #2   Title increase gross shouler strength to >/= 4/5 with no report of pain during testing.    Baseline see flowsheet    Time 8    Period Weeks    Status New    Target Date 04/09/21      PT LONG TERM GOAL #3   Title pt to report </=2/10 max pain to demo improvement in function    Baseline 6/10 pain today    Time 8    Period Weeks    Status New    Target Date 04/09/21      PT LONG TERM GOAL #4   Title increase L rotation and R sidebending by >/= 10 degrees with max pain or </= 2/10 for safety with drivign    Baseline see flowsheet    Time 8    Period Weeks    Status New    Target Date 04/09/21      PT LONG TERM GOAL #5   Title pt to be IND with all HEP to be able to maintain and progress current LOF IND    Time 8    Period Weeks    Status New    Target Date 04/09/21                   Plan - 03/02/21 1127     Clinical Impression Statement Pt reports overall improvement with pain still present in left chest and left upper back only. She reports compliance with HEP. Time spent with progression of HEP. Pt with limited time today due to having 3 children with her today at her appointment  because of winter break from school.  After session she reported feeling better with less pain.    PT Treatment/Interventions ADLs/Self Care Home Management;Cryotherapy;Iontophoresis 4mg /ml Dexamethasone;Traction;Moist Heat;Functional mobility training;Therapeutic activities;Electrical Stimulation;Therapeutic exercise;Neuromuscular re-education;Patient/family education;Manual techniques;Passive range of motion;Dry needling;Taping    PT Next Visit Plan check response to HEP progression- posterior shoulder strengthening- posture and lifting as it relates to lifting and carrying her child.    PT Home Exercise Plan GHCDRETD - upper trap stretch, levator scapulae stretch, chin tuck (seated), scapular retraction, seated ER with scap retract green band, seated horizontal abduction green band, standing row green band, wall push up             Patient will benefit from skilled therapeutic intervention in order to improve the following deficits and impairments:  Improper body mechanics, Increased muscle spasms, Decreased strength, Postural dysfunction, Pain, Decreased activity tolerance  Visit Diagnosis: TMJ dysfunction  Cervicalgia     Problem List Patient Active Problem List   Diagnosis Date Noted   Vitamin D deficiency 11/11/2020   Iron deficiency 11/11/2020   Gestational diabetes    Gestational hypertension    Breech presentation of fetus 10/26/2019   Language barrier 10/12/2019   Malpresentation before onset of labor 10/12/2019    10/14/2019, PTA 03/02/2021, 11:31 AM  [facility] [address] [place], [place], [place] Phone: [phone]   Fax:  [phone]  Name: Nancey Kreitz MRN: Ellis Parents Date of Birth: 06-27-1990

## 2021-03-10 ENCOUNTER — Telehealth: Payer: Self-pay | Admitting: Physical Therapy

## 2021-03-10 ENCOUNTER — Ambulatory Visit: Payer: Medicaid Other | Admitting: Physical Therapy

## 2021-03-10 NOTE — Telephone Encounter (Signed)
Contacted patient via in-person interpreter after No show to appointment. She reports that she forgot about her appointment today. Confirmed her next appointment date/time and reminded her of the attendance policy.

## 2021-03-16 ENCOUNTER — Other Ambulatory Visit: Payer: Self-pay

## 2021-03-16 ENCOUNTER — Ambulatory Visit (HOSPITAL_COMMUNITY)
Admission: EM | Admit: 2021-03-16 | Discharge: 2021-03-16 | Disposition: A | Discharge status: Home / Self Care | Payer: Medicaid Other | Attending: Family Medicine | Admitting: Family Medicine

## 2021-03-16 ENCOUNTER — Encounter (HOSPITAL_COMMUNITY): Payer: Self-pay | Admitting: Emergency Medicine

## 2021-03-16 DIAGNOSIS — H6692 Otitis media, unspecified, left ear: Secondary | ICD-10-CM

## 2021-03-16 MED ORDER — AMOXICILLIN 500 MG PO CAPS: 500.0000 mg | ORAL_CAPSULE | Freq: Three times a day (TID) | ORAL | 0 refills | Status: AC

## 2021-03-16 NOTE — Discharge Instructions (Addendum)
You have a left ear infection. Take amoxicillin 500 mg 1 tab 3 times daily for 7 days. You can take tylenol as needed for pain.

## 2021-03-16 NOTE — ED Triage Notes (Signed)
Pt presents with c/o left ear pain. Reports pain started yesterday and feels better when she covers her ear. Denies any drainage or ringing.

## 2021-03-16 NOTE — ED Provider Notes (Signed)
[facility]    CSN: [id number] Arrival date & time: 03/16/21  1002      History   Chief Complaint Chief Complaint  Patient presents with   Otalgia    HPI Deborah Lawson is a 31 y.o. female.    Otalgia Here for a 2 day h/o left ear pain. No f/c. No URI symptoms, except states she has had a little tenderness under her chin. No dc or itching.    Past Medical History:  Diagnosis Date   Gestational diabetes    Gestational hypertension    Medical history non-contributory     Patient Active Problem List   Diagnosis Date Noted   Vitamin D deficiency 11/11/2020   Iron deficiency 11/11/2020   Gestational diabetes    Gestational hypertension    Breech presentation of fetus 10/26/2019   Language barrier 10/12/2019   Malpresentation before onset of labor 10/12/2019    Past Surgical History:  Procedure Laterality Date   CESAREAN SECTION N/A 10/26/2019   Procedure: CESAREAN SECTION;  Surgeon: Reva Bores, MD;  Location: [facility];  Service: Obstetrics;  Laterality: N/A;   MANDIBLE SURGERY      OB History     Gravida  3   Para  3   Term  3   Preterm  0   AB  0   Living  3      SAB  0   IAB  0   Ectopic  0   Multiple  0   Live Births  3            Home Medications    Prior to Admission medications   Medication Sig Start Date End Date Taking? Authorizing Provider  amoxicillin (AMOXIL) 500 MG capsule Take 1 capsule (500 mg total) by mouth 3 (three) times daily for 7 days. 03/16/21 03/23/21 Yes Zenia Resides, MD  gabapentin (NEURONTIN) 300 MG capsule 1 capsule twice daily 02/09/21   Drema Dallas, DO  ibuprofen (ADVIL) 800 MG tablet Take 1 tablet (800 mg total) by mouth 3 (three) times daily. 01/09/21   Rushie Chestnut, PA-C  Iron, Ferrous Sulfate, 325 (65 Fe) MG TABS Take 325 mg by mouth daily. 11/11/20   Philip Aspen, Limmie Patricia, MD  NIFEdipine (PROCARDIA XL) 30 MG 24 hr tablet Take 1 tablet (30 mg total) by mouth daily.  11/05/19 02/18/20  Donette Larry, CNM    Family History Family History  Problem Relation Age of Onset   Hypertension Father     Social History Social History   Tobacco Use   Smoking status: Never   Smokeless tobacco: Never  Vaping Use   Vaping Use: Never used  Substance Use Topics   Alcohol use: Never   Drug use: Never     Allergies   Patient has no known allergies.   Review of Systems Review of Systems  HENT:  Positive for ear pain.     Physical Exam Triage Vital Signs ED Triage Vitals  Enc Vitals Group     BP 03/16/21 1120 112/76     Pulse Rate 03/16/21 1120 88     Resp 03/16/21 1120 16     Temp 03/16/21 1120 98.2 F (36.8 C)     Temp Source 03/16/21 1120 Oral     SpO2 03/16/21 1120 100 %     Weight 03/16/21 1123 194 lb 7.1 oz (88.2 kg)     Height 03/16/21 1123 5\' 3"  (1.6 m)  Head Circumference --      Peak Flow --      Pain Score 03/16/21 1122 9     Pain Loc --      Pain Edu? --      Excl. in GC? --    No data found.  Updated Vital Signs BP 112/76 (BP Location: Right Arm)    Pulse 88    Temp 98.2 F (36.8 C) (Oral)    Resp 16    Ht 5\' 3"  (1.6 m)    Wt 88.2 kg    SpO2 100%    BMI 34.44 kg/m   Visual Acuity Right Eye Distance:   Left Eye Distance:   Bilateral Distance:    Right Eye Near:   Left Eye Near:    Bilateral Near:     Physical Exam Vitals reviewed.  Constitutional:      General: She is not in acute distress.    Appearance: She is not toxic-appearing or diaphoretic.  HENT:     Right Ear: Tympanic membrane and ear canal normal.     Left Ear: Ear canal normal.     Ears:     Comments: Left TM is pink/dull/slightly bulging    Nose: Nose normal.     Mouth/Throat:     Mouth: Mucous membranes are moist.     Pharynx: No oropharyngeal exudate or posterior oropharyngeal erythema.  Eyes:     Extraocular Movements: Extraocular movements intact.     Pupils: Pupils are equal, round, and reactive to light.  Cardiovascular:     Rate  and Rhythm: Normal rate and regular rhythm.     Heart sounds: No murmur heard. Pulmonary:     Breath sounds: No wheezing, rhonchi or rales.  Musculoskeletal:     Cervical back: Neck supple.  Lymphadenopathy:     Cervical: No cervical adenopathy.  Skin:    Capillary Refill: Capillary refill takes less than 2 seconds.     Coloration: Skin is not jaundiced or pale.  Neurological:     General: No focal deficit present.     Mental Status: She is alert and oriented to person, place, and time.  Psychiatric:        Behavior: Behavior normal.     UC Treatments / Results  Labs (all labs ordered are listed, but only abnormal results are displayed) Labs Reviewed - No data to display  EKG   Radiology No results found.  Procedures Procedures (including critical care time)  Medications Ordered in UC Medications - No data to display  Initial Impression / Assessment and Plan / UC Course  I have reviewed the triage vital signs and the nursing notes.  Pertinent labs & imaging results that were available during my care of the patient were reviewed by me and considered in my medical decision making (see chart for details).     Will treat the OM with amoxicillin Final Clinical Impressions(s) / UC Diagnoses   Final diagnoses:  Left otitis media, unspecified otitis media type     Discharge Instructions      You have a left ear infection. Take amoxicillin 500 mg 1 tab 3 times daily for 7 days. You can take tylenol as needed for pain.     ED Prescriptions     Medication Sig Dispense Auth. Provider   amoxicillin (AMOXIL) 500 MG capsule Take 1 capsule (500 mg total) by mouth 3 (three) times daily for 7 days. 21 capsule , MD  PDMP not reviewed this encounter.   Zenia Resides, MD 03/16/21 1135

## 2021-03-17 ENCOUNTER — Ambulatory Visit: Payer: Medicaid Other | Attending: Neurology

## 2021-03-17 DIAGNOSIS — M542 Cervicalgia: Secondary | ICD-10-CM | POA: Insufficient documentation

## 2021-03-17 NOTE — Therapy (Addendum)
[place], [place], [address] Phone: [phone]   Fax:  [phone]  Physical Therapy Treatment/Discharge  Patient Details  Name: Deborah Lawson MRN: [id number] Date of Birth: 04/28/1990 Referring Provider (PT): Pieter Partridge, DO   Encounter Date: 03/17/2021   PT End of Session - 03/17/21 1155     Visit Number 4    Number of Visits 9    Date for PT Re-Evaluation 04/09/21    Authorization Type [employer] MCD- 27 visit    PT Start Time 1206    PT Stop Time 1238    PT Time Calculation (min) 32 min    Activity Tolerance Patient tolerated treatment well    Behavior During Therapy [facility] for tasks assessed/performed             Past Medical History:  Diagnosis Date   Gestational diabetes    Gestational hypertension    Medical history non-contributory     Past Surgical History:  Procedure Laterality Date   CESAREAN SECTION N/A 10/26/2019   Procedure: CESAREAN SECTION;  Surgeon: Donnamae Jude, MD;  Location: [facility];  Service: Obstetrics;  Laterality: N/A;   MANDIBLE SURGERY      There were no vitals filed for this visit.   Subjective Assessment - 03/17/21 1206     Subjective Patient reports her neck is feeling good. She reports a little bit of pain currently along the left upper trap. She reports compliance with HEP and feels this is helping to manage her pain.    Patient is accompained by: Interpreter   Samir [phone]; deferred interpreter halfway through session   Currently in Pain? Yes    Pain Score 4     Pain Location --   upper trap   Pain Orientation Left    Pain Descriptors / Indicators Other (Comment)   mild   Pain Type Acute pain    Pain Onset More than a month ago    Pain Frequency Intermittent    Aggravating Factors  carrying my daughter    Pain Relieving Factors exercises              [facility] Adult PT Treatment/Exercise:   Therapeutic Exercise: - Resisted shoulder diagonals  and horizontal abduction yellow band x 10 - Prone scapular retraction x 10  - Prone W x 10  - Serratus wall slide x 10  - seated chin tuck x 10 - seated upper trap stretch 30 sec each  - updated HEP.   [facility] PT Assessment - 03/17/21 0001       AROM   Cervical - Right Side Bend 35    Cervical - Left Side Bend 50                                      PT Short Term Goals - 03/17/21 1212       PT SHORT TERM GOAL #1   Title pt to be IND with intial HEP    Baseline postural cues required    Time 4    Period Weeks    Status On-going    Target Date 03/12/21               PT Long Term Goals - 02/12/21 1325       PT LONG TERM GOAL #1   Title pt to verbalize/ demo efficient posture and  lifting mechanics to reduce and prevent L shoulder pain    Baseline no previous knowledge of posture and lifting mechanics    Time 8    Period Weeks    Status New    Target Date 04/09/21      PT LONG TERM GOAL #2   Title increase gross shouler strength to >/= 4/5 with no report of pain during testing.    Baseline see flowsheet    Time 8    Period Weeks    Status New    Target Date 04/09/21      PT LONG TERM GOAL #3   Title pt to report </=2/10 max pain to demo improvement in function    Baseline 6/10 pain today    Time 8    Period Weeks    Status New    Target Date 04/09/21      PT LONG TERM GOAL #4   Title increase L rotation and R sidebending by >/= 10 degrees with max pain or </= 2/10 for safety with drivign    Baseline see flowsheet    Time 8    Period Weeks    Status New    Target Date 04/09/21      PT LONG TERM GOAL #5   Title pt to be IND with all HEP to be able to maintain and progress current LOF IND    Time 8    Period Weeks    Status New    Target Date 04/09/21                   Plan - 03/17/21 1219     Clinical Impression Statement Session was limited due to patient having 3 young children with her during the  appointment. Focused on progression of HEP to include further periscapular strengthening, which she tolerated well reporting fatigue with all ther ex, though no increased pain. She required moderate postural cues throughout session to decrease excessive upper trap engagement with occasional ability to correct independently. Cervical lateral flexion remains unchanged compared to baseline. Connection to interpreting services was lost halfway into session with patient deferring need for interpreter for remainder of session.    PT Treatment/Interventions ADLs/Self Care Home Management;Cryotherapy;Iontophoresis 17m/ml Dexamethasone;Traction;Moist Heat;Functional mobility training;Therapeutic activities;Electrical Stimulation;Therapeutic exercise;Neuromuscular re-education;Patient/family education;Manual techniques;Passive range of motion;Dry needling;Taping    PT Next Visit Plan check response to HEP progression- posterior shoulder strengthening- posture and lifting as it relates to lifting and carrying her child.    PT Home Exercise Plan GHCDRETD - upper trap stretch, levator scapulae stretch, chin tuck (seated), scapular retraction, seated ER with scap retract green band, seated horizontal abduction green band, standing row green band, wall push up    Consulted and Agree with Plan of Care Patient             Patient will benefit from skilled therapeutic intervention in order to improve the following deficits and impairments:  Improper body mechanics, Increased muscle spasms, Decreased strength, Postural dysfunction, Pain, Decreased activity tolerance  Visit Diagnosis: Cervicalgia     Problem List Patient Active Problem List   Diagnosis Date Noted   Vitamin D deficiency 11/11/2020   Iron deficiency 11/11/2020   Gestational diabetes    Gestational hypertension    Breech presentation of fetus 10/26/2019   Language barrier 10/12/2019   Malpresentation before onset of labor 10/12/2019  SGwendolyn Grant PT, DPT, ATC 03/17/21 12:42 PM  PHYSICAL THERAPY DISCHARGE SUMMARY  Visits from Start of Care: 4  Current functional level related to goals / functional outcomes: No formal re-assessment   Remaining deficits: Status unknown   Education / Equipment: N/A   Patient agrees to discharge. Patient goals were not met. Patient is being discharged due to not returning since the last visit.  Gwendolyn Grant, PT, DPT, ATC 05/20/21 10:49 AM  [facility] [address] [place], [place], [address] Phone: [phone]   Fax:  [phone]  Name: Deborah Lawson MRN: [id number] Date of Birth: 01-03-1991

## 2021-03-28 ENCOUNTER — Encounter: Payer: Self-pay | Admitting: Internal Medicine

## 2021-04-24 ENCOUNTER — Encounter: Payer: Self-pay | Admitting: Internal Medicine

## 2021-04-27 ENCOUNTER — Other Ambulatory Visit: Payer: Self-pay | Admitting: Internal Medicine

## 2021-04-27 DIAGNOSIS — E611 Iron deficiency: Secondary | ICD-10-CM

## 2021-04-27 DIAGNOSIS — E559 Vitamin D deficiency, unspecified: Secondary | ICD-10-CM

## 2021-04-30 ENCOUNTER — Ambulatory Visit (HOSPITAL_COMMUNITY)
Admission: EM | Admit: 2021-04-30 | Discharge: 2021-04-30 | Disposition: A | Discharge status: Home / Self Care | Payer: Medicaid Other | Attending: Physician Assistant | Admitting: Physician Assistant

## 2021-04-30 ENCOUNTER — Other Ambulatory Visit: Payer: Self-pay

## 2021-04-30 ENCOUNTER — Encounter (HOSPITAL_COMMUNITY): Payer: Self-pay | Admitting: Physician Assistant

## 2021-04-30 DIAGNOSIS — Z20822 Contact with and (suspected) exposure to covid-19: Secondary | ICD-10-CM | POA: Insufficient documentation

## 2021-04-30 DIAGNOSIS — R051 Acute cough: Secondary | ICD-10-CM | POA: Diagnosis present

## 2021-04-30 DIAGNOSIS — J9801 Acute bronchospasm: Secondary | ICD-10-CM | POA: Insufficient documentation

## 2021-04-30 DIAGNOSIS — R300 Dysuria: Secondary | ICD-10-CM | POA: Insufficient documentation

## 2021-04-30 DIAGNOSIS — J069 Acute upper respiratory infection, unspecified: Secondary | ICD-10-CM

## 2021-04-30 LAB — POC INFLUENZA A AND B ANTIGEN (URGENT CARE ONLY)
INFLUENZA A ANTIGEN, POC: NEGATIVE
INFLUENZA B ANTIGEN, POC: NEGATIVE

## 2021-04-30 LAB — POCT URINALYSIS DIPSTICK, ED / UC
Bilirubin Urine: NEGATIVE
Glucose, UA: NEGATIVE mg/dL
Ketones, ur: NEGATIVE mg/dL
Leukocytes,Ua: NEGATIVE
Nitrite: NEGATIVE
Protein, ur: NEGATIVE mg/dL
Specific Gravity, Urine: 1.015 (ref 1.005–1.030)
Urobilinogen, UA: 0.2 mg/dL (ref 0.0–1.0)
pH: 5.5 (ref 5.0–8.0)

## 2021-04-30 LAB — POC URINE PREG, ED: Preg Test, Ur: NEGATIVE

## 2021-04-30 MED ORDER — ALBUTEROL SULFATE HFA 108 (90 BASE) MCG/ACT IN AERS: 1.0000 | INHALATION_SPRAY | Freq: Four times a day (QID) | RESPIRATORY_TRACT | 0 refills | Status: DC | PRN

## 2021-04-30 MED ORDER — BENZONATATE 100 MG PO CAPS: 100.0000 mg | ORAL_CAPSULE | Freq: Three times a day (TID) | ORAL | 0 refills | Status: DC

## 2021-04-30 MED ORDER — ALBUTEROL SULFATE HFA 108 (90 BASE) MCG/ACT IN AERS
INHALATION_SPRAY | RESPIRATORY_TRACT | Status: AC
  Filled 2021-04-30: qty 6.7

## 2021-04-30 MED ORDER — PREDNISONE 20 MG PO TABS: 40.0000 mg | ORAL_TABLET | Freq: Every day | ORAL | 0 refills | Status: DC

## 2021-04-30 MED ORDER — ALBUTEROL SULFATE HFA 108 (90 BASE) MCG/ACT IN AERS: 2.0000 | INHALATION_SPRAY | Freq: Once | RESPIRATORY_TRACT | Status: DC

## 2021-04-30 NOTE — ED Provider Notes (Signed)
[facility]    CSN: [id number] Arrival date & time: 04/30/21  1623      History   Chief Complaint Chief Complaint  Patient presents with   Fever    Cough    HPI Basil Motsaem Tijerino is a 31 y.o. female.   Patient presents today with a several day history of URI symptoms.  Reports subjective fever, cough, change in sense of smell, fatigue.  Denies any nausea, vomiting, chest pain, shortness of breath.  She has tried over-the-counter day and night medication without improvement of symptoms.  Denies any known sick contacts.  She has had COVID-19 vaccine.  She denies history of allergies, asthma, COPD.  She does not smoke.  She denies any recent antibiotic use.  She is unsure if she could be pregnant.  She is also requesting that we do a UA as she has had some dysuria but denies additional symptoms including abdominal pain, pelvic pain, fever, nausea, vomiting.   Past Medical History:  Diagnosis Date   Gestational diabetes    Gestational hypertension    Medical history non-contributory     Patient Active Problem List   Diagnosis Date Noted   Vitamin D deficiency 11/11/2020   Iron deficiency 11/11/2020   Gestational diabetes    Gestational hypertension    Breech presentation of fetus 10/26/2019   Language barrier 10/12/2019   Malpresentation before onset of labor 10/12/2019    Past Surgical History:  Procedure Laterality Date   CESAREAN SECTION N/A 10/26/2019   Procedure: CESAREAN SECTION;  Surgeon: Reva Bores, MD;  Location: [facility];  Service: Obstetrics;  Laterality: N/A;   MANDIBLE SURGERY      OB History     Gravida  3   Para  3   Term  3   Preterm  0   AB  0   Living  3      SAB  0   IAB  0   Ectopic  0   Multiple  0   Live Births  3            Home Medications    Prior to Admission medications   Medication Sig Start Date End Date Taking? Authorizing Provider  albuterol (VENTOLIN HFA) 108 (90 Base) MCG/ACT inhaler  Inhale 1-2 puffs into the lungs every 6 (six) hours as needed for wheezing or shortness of breath. 04/30/21  Yes Nikola Marone K, PA-C  benzonatate (TESSALON) 100 MG capsule Take 1 capsule (100 mg total) by mouth every 8 (eight) hours. 04/30/21  Yes Neenah Canter K, PA-C  predniSONE (DELTASONE) 20 MG tablet Take 2 tablets (40 mg total) by mouth daily for 4 days. 04/30/21 05/04/21 Yes Jamar Weatherall, Noberto Retort, PA-C  gabapentin (NEURONTIN) 300 MG capsule 1 capsule twice daily 02/09/21   Shon Millet R, DO  ibuprofen (ADVIL) 800 MG tablet Take 1 tablet (800 mg total) by mouth 3 (three) times daily. 01/09/21   Rushie Chestnut, PA-C  Iron, Ferrous Sulfate, 325 (65 Fe) MG TABS Take 325 mg by mouth daily. 11/11/20   Philip Aspen, Limmie Patricia, MD  NIFEdipine (PROCARDIA XL) 30 MG 24 hr tablet Take 1 tablet (30 mg total) by mouth daily. 11/05/19 02/18/20  Donette Larry, CNM    Family History Family History  Problem Relation Age of Onset   Hypertension Father     Social History Social History   Tobacco Use   Smoking status: Never   Smokeless tobacco: Never  Vaping Use  Vaping Use: Never used  Substance Use Topics   Alcohol use: Never   Drug use: Never     Allergies   Patient has no known allergies.   Review of Systems Review of Systems  Constitutional:  Positive for activity change and fever. Negative for appetite change and fatigue.  HENT:  Positive for congestion and sore throat. Negative for sinus pressure and sneezing.   Respiratory:  Positive for cough. Negative for shortness of breath.   Cardiovascular:  Negative for chest pain.  Gastrointestinal:  Negative for abdominal pain, diarrhea, nausea and vomiting.  Genitourinary:  Positive for dysuria. Negative for frequency, urgency, vaginal bleeding, vaginal discharge and vaginal pain.  Neurological:  Negative for dizziness, light-headedness and headaches.    Physical Exam Triage Vital Signs ED Triage Vitals [04/30/21 1726]  Enc Vitals Group      BP 120/82     Pulse Rate 91     Resp 16     Temp 98.3 F (36.8 C)     Temp Source Oral     SpO2 100 %     Weight      Height      Head Circumference      Peak Flow      Pain Score 4     Pain Loc      Pain Edu?      Excl. in GC?    No data found.  Updated Vital Signs BP 120/82 (BP Location: Left Arm)    Pulse 91    Temp 98.3 F (36.8 C) (Oral)    Resp 16    SpO2 100%   Visual Acuity Right Eye Distance:   Left Eye Distance:   Bilateral Distance:    Right Eye Near:   Left Eye Near:    Bilateral Near:     Physical Exam Vitals reviewed.  Constitutional:      General: She is awake. She is not in acute distress.    Appearance: Normal appearance. She is well-developed. She is not ill-appearing.     Comments: Very pleasant female appears stated age in no acute distress sitting comfortably in exam room  HENT:     Head: Normocephalic and atraumatic.     Right Ear: Tympanic membrane, ear canal and external ear normal. Tympanic membrane is not erythematous or bulging.     Left Ear: Tympanic membrane, ear canal and external ear normal. Tympanic membrane is not erythematous or bulging.     Nose:     Right Sinus: No maxillary sinus tenderness or frontal sinus tenderness.     Left Sinus: No maxillary sinus tenderness or frontal sinus tenderness.     Mouth/Throat:     Pharynx: Uvula midline. Posterior oropharyngeal erythema present. No oropharyngeal exudate.  Cardiovascular:     Rate and Rhythm: Normal rate and regular rhythm.     Heart sounds: Normal heart sounds, S1 normal and S2 normal. No murmur heard. Pulmonary:     Effort: Pulmonary effort is normal.     Breath sounds: Wheezing present. No rhonchi or rales.  Musculoskeletal:     Right lower leg: No edema.     Left lower leg: No edema.  Psychiatric:        Behavior: Behavior is cooperative.     UC Treatments / Results  Labs (all labs ordered are listed, but only abnormal results are displayed) Labs Reviewed   POCT URINALYSIS DIPSTICK, ED / UC - Abnormal; Notable for the following components:  Result Value   Hgb urine dipstick TRACE (*)    All other components within normal limits  SARS CORONAVIRUS 2 (TAT 6-24 HRS)  POC URINE PREG, ED  POC INFLUENZA A AND B ANTIGEN (URGENT CARE ONLY)    EKG   Radiology No results found.  Procedures Procedures (including critical care time)  Medications Ordered in UC Medications  albuterol (VENTOLIN HFA) 108 (90 Base) MCG/ACT inhaler 2 puff (has no administration in time range)    Initial Impression / Assessment and Plan / UC Course  I have reviewed the triage vital signs and the nursing notes.  Pertinent labs & imaging results that were available during my care of the patient were reviewed by me and considered in my medical decision making (see chart for details).     Patient had negative pregnancy test in clinic.  UA was normal with the exception of trace hemoglobin that has been present for 1 year.  Discussed likely viral etiology of symptoms.  No evidence of acute infection that would initiation of antibiotics.  Flu testing was negative.  COVID test is pending.  Patient was provided work excuse note.  She was given albuterol in clinic with improvement resolution of wheezing.  She was sent home with albuterol inhaler with instruction to use this every 4-6 hours as needed for shortness of breath.  We will start prednisone taper and she was instructed not to take NSAIDs.  Can use Mucinex, Flonase, Tylenol for symptom relief.  She was to rest and drink plenty fluid.  Discussed that if symptoms are not improving by next week she should return here or see her PCP.  Discussed alarm symptoms that warrant emergent evaluation including chest pain, shortness of breath, fever, nausea, vomiting she should be seen immediately.  Strict return precautions given to which she expressed understanding.  Final Clinical Impressions(s) / UC Diagnoses   Final diagnoses:   Upper respiratory tract infection, unspecified type  Bronchospasm  Acute cough     Discharge Instructions      Your flu test was negative.  We will contact you if your COVID test is positive.  Use albuterol via 4 to 6 hours for shortness of breath and coughing.  Start prednisone 40 mg daily.  Do not take NSAIDs including aspirin, ibuprofen/Advil, naproxen/Aleve with this medication.  You can use Tessalon for cough.  Alternate Tylenol, Mucinex, Flonase for symptom relief.  Make sure you rest and drink plenty fluid.  If symptoms or not improving by next week please return here or see your PCP.  If anything worsens you have high fever, chest pain, shortness of breath, nausea/vomiting you need to go to the emergency room.     ED Prescriptions     Medication Sig Dispense Auth. Provider   albuterol (VENTOLIN HFA) 108 (90 Base) MCG/ACT inhaler Inhale 1-2 puffs into the lungs every 6 (six) hours as needed for wheezing or shortness of breath. 8 g Corie Allis K, PA-C   predniSONE (DELTASONE) 20 MG tablet Take 2 tablets (40 mg total) by mouth daily for 4 days. 8 tablet Orton Capell K, PA-C   benzonatate (TESSALON) 100 MG capsule Take 1 capsule (100 mg total) by mouth every 8 (eight) hours. 21 capsule Tyger Oka K, PA-C      PDMP not reviewed this encounter.   Jeani Hawking, PA-C 04/30/21 1912

## 2021-04-30 NOTE — ED Triage Notes (Signed)
Pt presents with c/o cough and fever x 2 days.

## 2021-04-30 NOTE — Discharge Instructions (Signed)
Your flu test was negative.  We will contact you if your COVID test is positive.  Use albuterol via 4 to 6 hours for shortness of breath and coughing.  Start prednisone 40 mg daily.  Do not take NSAIDs including aspirin, ibuprofen/Advil, naproxen/Aleve with this medication.  You can use Tessalon for cough.  Alternate Tylenol, Mucinex, Flonase for symptom relief.  Make sure you rest and drink plenty fluid.  If symptoms or not improving by next week please return here or see your PCP.  If anything worsens you have high fever, chest pain, shortness of breath, nausea/vomiting you need to go to the emergency room.

## 2021-05-01 LAB — SARS CORONAVIRUS 2 (TAT 6-24 HRS): SARS Coronavirus 2: NEGATIVE

## 2021-05-04 ENCOUNTER — Ambulatory Visit (HOSPITAL_COMMUNITY)
Admission: EM | Admit: 2021-05-04 | Discharge: 2021-05-04 | Disposition: A | Discharge status: Home / Self Care | Payer: Medicaid Other | Attending: Physician Assistant | Admitting: Physician Assistant

## 2021-05-04 ENCOUNTER — Encounter (HOSPITAL_COMMUNITY): Payer: Self-pay | Admitting: Emergency Medicine

## 2021-05-04 ENCOUNTER — Other Ambulatory Visit: Payer: Self-pay

## 2021-05-04 DIAGNOSIS — R051 Acute cough: Secondary | ICD-10-CM | POA: Diagnosis present

## 2021-05-04 DIAGNOSIS — J4521 Mild intermittent asthma with (acute) exacerbation: Secondary | ICD-10-CM | POA: Diagnosis not present

## 2021-05-04 LAB — POCT RAPID STREP A, ED / UC: Streptococcus, Group A Screen (Direct): NEGATIVE

## 2021-05-04 MED ORDER — IPRATROPIUM-ALBUTEROL 0.5-2.5 (3) MG/3ML IN SOLN
RESPIRATORY_TRACT | Status: AC
  Filled 2021-05-04: qty 3

## 2021-05-04 MED ORDER — IPRATROPIUM-ALBUTEROL 0.5-2.5 (3) MG/3ML IN SOLN
3.0000 mL | Freq: Once | RESPIRATORY_TRACT | Status: AC
  Administered 2021-05-04: 3 mL via RESPIRATORY_TRACT

## 2021-05-04 MED ORDER — METHYLPREDNISOLONE 4 MG PO TBPK: ORAL_TABLET | ORAL | 0 refills | Status: DC

## 2021-05-04 MED ORDER — ALBUTEROL SULFATE HFA 108 (90 BASE) MCG/ACT IN AERS: 1.0000 | INHALATION_SPRAY | Freq: Four times a day (QID) | RESPIRATORY_TRACT | 0 refills | Status: DC | PRN

## 2021-05-04 MED ORDER — AMOXICILLIN-POT CLAVULANATE 875-125 MG PO TABS: 1.0000 | ORAL_TABLET | Freq: Two times a day (BID) | ORAL | 0 refills | Status: DC

## 2021-05-04 NOTE — ED Triage Notes (Signed)
Runny nose, cough, sore throat, losing sense of smell.  Onset 5 days ago

## 2021-05-04 NOTE — ED Provider Notes (Signed)
[facility]    CSN: [id number] Arrival date & time: 05/04/21  1052      History   Chief Complaint Chief Complaint  Patient presents with   Cough    HPI Deborah Lawson is a 31 y.o. female.   Mother presents today with a 5-day history of URI symptoms.  She reports sore throat, cough, wheezing, shortness of breath.  She denies fever, chest pain, nausea, vomiting.  She was seen 04/30/2020 at which point she was negative for flu and COVID.  She was prescribed albuterol, prednisone, Tessalon which did not provide significant relief of symptoms.  She has tried any over-the-counter with temporary improvement of symptoms.  She denies history of asthma or allergies.  Denies any recent antibiotic use.  She does not have a history of diabetes but did have gestational diabetes.   Past Medical History:  Diagnosis Date   Gestational diabetes    Gestational hypertension    Medical history non-contributory     Patient Active Problem List   Diagnosis Date Noted   Vitamin D deficiency 11/11/2020   Iron deficiency 11/11/2020   Gestational diabetes    Gestational hypertension    Breech presentation of fetus 10/26/2019   Language barrier 10/12/2019   Malpresentation before onset of labor 10/12/2019    Past Surgical History:  Procedure Laterality Date   CESAREAN SECTION N/A 10/26/2019   Procedure: CESAREAN SECTION;  Surgeon: Reva Bores, MD;  Location: [facility];  Service: Obstetrics;  Laterality: N/A;   MANDIBLE SURGERY      OB History     Gravida  3   Para  3   Term  3   Preterm  0   AB  0   Living  3      SAB  0   IAB  0   Ectopic  0   Multiple  0   Live Births  3            Home Medications    Prior to Admission medications   Medication Sig Start Date End Date Taking? Authorizing Provider  amoxicillin-clavulanate (AUGMENTIN) 875-125 MG tablet Take 1 tablet by mouth every 12 (twelve) hours. 05/04/21  Yes Manuel Lawhead K, PA-C   methylPREDNISolone (MEDROL DOSEPAK) 4 MG TBPK tablet As directed 05/04/21  Yes Carsin Randazzo K, PA-C  albuterol (VENTOLIN HFA) 108 (90 Base) MCG/ACT inhaler Inhale 1-2 puffs into the lungs every 6 (six) hours as needed for wheezing or shortness of breath. 05/04/21   Alainah Phang, Noberto Retort, PA-C  gabapentin (NEURONTIN) 300 MG capsule 1 capsule twice daily 02/09/21   Drema Dallas, DO  ibuprofen (ADVIL) 800 MG tablet Take 1 tablet (800 mg total) by mouth 3 (three) times daily. 01/09/21   Rushie Chestnut, PA-C  Iron, Ferrous Sulfate, 325 (65 Fe) MG TABS Take 325 mg by mouth daily. 11/11/20   Philip Aspen, Limmie Patricia, MD  NIFEdipine (PROCARDIA XL) 30 MG 24 hr tablet Take 1 tablet (30 mg total) by mouth daily. 11/05/19 02/18/20  Donette Larry, CNM    Family History Family History  Problem Relation Age of Onset   Hypertension Father     Social History Social History   Tobacco Use   Smoking status: Never   Smokeless tobacco: Never  Vaping Use   Vaping Use: Never used  Substance Use Topics   Alcohol use: Never   Drug use: Never     Allergies   Patient has no known allergies.  Review of Systems Review of Systems  Constitutional:  Positive for activity change. Negative for appetite change, fatigue and fever.  HENT:  Positive for congestion and sore throat. Negative for sinus pressure and sneezing.   Respiratory:  Positive for cough, chest tightness and shortness of breath.   Cardiovascular:  Negative for chest pain.  Gastrointestinal:  Negative for abdominal pain, diarrhea, nausea and vomiting.  Musculoskeletal:  Negative for arthralgias and myalgias.  Neurological:  Negative for dizziness, light-headedness and headaches.    Physical Exam Triage Vital Signs ED Triage Vitals  Enc Vitals Group     BP 05/04/21 1301 120/83     Pulse Rate 05/04/21 1301 (!) 107     Resp 05/04/21 1301 18     Temp 05/04/21 1301 99 F (37.2 C)     Temp Source 05/04/21 1301 Oral     SpO2 05/04/21 1301  96 %     Weight --      Height --      Head Circumference --      Peak Flow --      Pain Score 05/04/21 1254 8     Pain Loc --      Pain Edu? --      Excl. in GC? --    No data found.  Updated Vital Signs BP 120/83 (BP Location: Right Arm)    Pulse (!) 107    Temp 99 F (37.2 C) (Oral)    Resp 18    SpO2 96%   Visual Acuity Right Eye Distance:   Left Eye Distance:   Bilateral Distance:    Right Eye Near:   Left Eye Near:    Bilateral Near:     Physical Exam Vitals reviewed.  Constitutional:      General: She is awake. She is not in acute distress.    Appearance: Normal appearance. She is well-developed. She is not ill-appearing.     Comments: Very pleasant female appears stated age in no acute distress sitting comfortably in exam room  HENT:     Head: Normocephalic and atraumatic.     Right Ear: Tympanic membrane, ear canal and external ear normal. Tympanic membrane is not erythematous or bulging.     Left Ear: Tympanic membrane, ear canal and external ear normal. Tympanic membrane is not erythematous or bulging.     Nose:     Right Sinus: No maxillary sinus tenderness or frontal sinus tenderness.     Left Sinus: No maxillary sinus tenderness or frontal sinus tenderness.     Mouth/Throat:     Pharynx: Uvula midline. Posterior oropharyngeal erythema present. No oropharyngeal exudate.  Cardiovascular:     Rate and Rhythm: Normal rate and regular rhythm.     Heart sounds: Normal heart sounds, S1 normal and S2 normal. No murmur heard. Pulmonary:     Effort: Pulmonary effort is normal.     Breath sounds: Wheezing present. No rhonchi or rales.  Psychiatric:        Behavior: Behavior is cooperative.     UC Treatments / Results  Labs (all labs ordered are listed, but only abnormal results are displayed) Labs Reviewed  CULTURE, GROUP A STREP [facility])  POCT RAPID STREP A, ED / UC    EKG   Radiology No results found.  Procedures Procedures (including critical care  time)  Medications Ordered in UC Medications  ipratropium-albuterol (DUONEB) 0.5-2.5 (3) MG/3ML nebulizer solution 3 mL (3 mLs Nebulization Given 05/04/21 1344)    Initial  Impression / Assessment and Plan / UC Course  I have reviewed the triage vital signs and the nursing notes.  Pertinent labs & imaging results that were available during my care of the patient were reviewed by me and considered in my medical decision making (see chart for details).     Patient had improvement of symptoms with in office DuoNeb.  Concern for asthma exacerbation given clinical presentation.  She was sent in refill of albuterol inhaler with instruction to use this every 4-6 hours as needed.  She will start methylprednisolone as well as Augmentin and we discussed that this can be passed through breast-feeding and she should avoid breast-feeding several hours after medication dose and monitor baby for any side effects.  She can use Mucinex, Flonase, Tylenol for symptom relief.  Recommended follow-up with PCP for additional testing and consideration of maintenance medication such as Symbicort.  Discussed that if symptoms or not improving she should follow-up for reevaluation.  If she has any worsening symptoms including high fever, chest pain, shortness of breath, nausea/vomiting interfere with oral intake she is to be seen immediately.  Strict return precautions given to which she expressed understanding.  Final Clinical Impressions(s) / UC Diagnoses   Final diagnoses:  Mild intermittent asthma with acute exacerbation  Acute cough     Discharge Instructions      Take Augmentin twice daily.  Start methylprednisolone Dosepak.  You should avoid breast-feeding several hours after taking these medications as they can be processed to the baby.  Monitor baby for any side effects.  Use albuterol inhaler every 4-6 hours as needed.  Follow-up with PCP next week.  Use Mucinex, Flonase, Tylenol for symptom relief.  If  anything worsens or is not improving please return for reevaluation.     ED Prescriptions     Medication Sig Dispense Auth. Provider   methylPREDNISolone (MEDROL DOSEPAK) 4 MG TBPK tablet As directed 21 tablet Seraj Dunnam K, PA-C   albuterol (VENTOLIN HFA) 108 (90 Base) MCG/ACT inhaler Inhale 1-2 puffs into the lungs every 6 (six) hours as needed for wheezing or shortness of breath. 8 g Ambri Miltner K, PA-C   amoxicillin-clavulanate (AUGMENTIN) 875-125 MG tablet Take 1 tablet by mouth every 12 (twelve) hours. 14 tablet Dilan Novosad, Noberto Retort, PA-C      PDMP not reviewed this encounter.   Jeani Hawking, PA-C 05/04/21 1415

## 2021-05-04 NOTE — Discharge Instructions (Signed)
Take Augmentin twice daily.  Start methylprednisolone Dosepak.  You should avoid breast-feeding several hours after taking these medications as they can be processed to the baby.  Monitor baby for any side effects.  Use albuterol inhaler every 4-6 hours as needed.  Follow-up with PCP next week.  Use Mucinex, Flonase, Tylenol for symptom relief.  If anything worsens or is not improving please return for reevaluation.

## 2021-05-05 ENCOUNTER — Ambulatory Visit (INDEPENDENT_AMBULATORY_CARE_PROVIDER_SITE_OTHER): Payer: Medicaid Other | Admitting: Internal Medicine

## 2021-05-05 ENCOUNTER — Encounter: Payer: Self-pay | Admitting: Internal Medicine

## 2021-05-05 VITALS — BP 120/90 | HR 117 | Temp 97.9°F | Wt 199.3 lb

## 2021-05-05 DIAGNOSIS — Z09 Encounter for follow-up examination after completed treatment for conditions other than malignant neoplasm: Secondary | ICD-10-CM

## 2021-05-05 DIAGNOSIS — J209 Acute bronchitis, unspecified: Secondary | ICD-10-CM

## 2021-05-05 MED ORDER — ALBUTEROL SULFATE HFA 108 (90 BASE) MCG/ACT IN AERS: 1.0000 | INHALATION_SPRAY | Freq: Four times a day (QID) | RESPIRATORY_TRACT | 0 refills | Status: DC | PRN

## 2021-05-05 NOTE — Progress Notes (Signed)
Acute office Visit     This visit occurred during the SARS-CoV-2 public health emergency.  Safety protocols were in place, including screening questions prior to the visit, additional usage of staff PPE, and extensive cleaning of exam room while observing appropriate contact time as indicated for disinfecting solutions.    CC/Reason for Visit: Follow-up ED visit  HPI: Deborah Lawson is a 31 y.o. female who is coming in today for the above mentioned reasons.  She was seen in the emergency department on 12/16 as well as 12/20 with URI symptoms and wheezing.  She likely had acute bronchitis secondary to unspecified URI.  Flu and COVID test were negative.  They prescribed her Augmentin which she completed, she was also advised to do albuterol inhaler and a Medrol Dosepak.  She has not started these yet.  She continues to have some mild wheezing.  She is concerned that she might have asthma.  Past Medical/Surgical History: Past Medical History:  Diagnosis Date   Gestational diabetes    Gestational hypertension    Medical history non-contributory     Past Surgical History:  Procedure Laterality Date   CESAREAN SECTION N/A 10/26/2019   Procedure: CESAREAN SECTION;  Surgeon: Donnamae Jude, MD;  Location: [facility];  Service: Obstetrics;  Laterality: N/A;   MANDIBLE SURGERY      Social History:  reports that she has never smoked. She has never used smokeless tobacco. She reports that she does not drink alcohol and does not use drugs.  Allergies: No Known Allergies  Family History:  Family History  Problem Relation Age of Onset   Hypertension Father      Current Outpatient Medications:    amoxicillin-clavulanate (AUGMENTIN) 875-125 MG tablet, Take 1 tablet by mouth every 12 (twelve) hours., Disp: 14 tablet, Rfl: 0   gabapentin (NEURONTIN) 300 MG capsule, 1 capsule twice daily, Disp: 60 capsule, Rfl: 5   ibuprofen (ADVIL) 800 MG tablet, Take 1 tablet (800 mg total) by  mouth 3 (three) times daily., Disp: 21 tablet, Rfl: 0   Iron, Ferrous Sulfate, 325 (65 Fe) MG TABS, Take 325 mg by mouth daily., Disp: 30 tablet, Rfl:    methylPREDNISolone (MEDROL DOSEPAK) 4 MG TBPK tablet, As directed, Disp: 21 tablet, Rfl: 0   albuterol (VENTOLIN HFA) 108 (90 Base) MCG/ACT inhaler, Inhale 1-2 puffs into the lungs every 6 (six) hours as needed for wheezing or shortness of breath., Disp: 8 g, Rfl: 0  Review of Systems:  Constitutional: Denies fever, chills, diaphoresis, appetite change and fatigue.  HEENT: Denies photophobia, eye pain, redness,  mouth sores, trouble swallowing, neck pain, neck stiffness and tinnitus.   Respiratory: Denies chest tightness,  and wheezing.   Cardiovascular: Denies chest pain, palpitations and leg swelling.  Gastrointestinal: Denies nausea, vomiting, abdominal pain, diarrhea, constipation, blood in stool and abdominal distention.  Genitourinary: Denies dysuria, urgency, frequency, hematuria, flank pain and difficulty urinating.  Endocrine: Denies: hot or cold intolerance, sweats, changes in hair or nails, polyuria, polydipsia. Musculoskeletal: Denies myalgias, back pain, joint swelling, arthralgias and gait problem.  Skin: Denies pallor, rash and wound.  Neurological: Denies dizziness, seizures, syncope, weakness, light-headedness, numbness and headaches.  Hematological: Denies adenopathy. Easy bruising, personal or family bleeding history  Psychiatric/Behavioral: Denies suicidal ideation, mood changes, confusion, nervousness, sleep disturbance and agitation    Physical Exam: Vitals:   05/05/21 0816  BP: 120/90  Pulse: (!) 117  Temp: 97.9 F (36.6 C)  TempSrc: Oral  SpO2: 99%  Weight: 199 lb 4.8 oz (90.4 kg)    Body mass index is 35.3 kg/m.   Constitutional: NAD, calm, comfortable Eyes: PERRL, lids and conjunctivae normal ENMT: Mucous membranes are moist. Posterior pharynx is erythematous but clear of any exudate or  lesions. Respiratory: Bilateral rhonchi and wheezing.  Normal respiratory effort. No accessory muscle use.  Cardiovascular: Regular rate and rhythm, no murmurs / rubs / gallops.  Neurologic: Grossly intact and nonfocal Psychiatric: Normal judgment and insight. Alert and oriented x 3. Normal mood.    Impression and Plan:  Hospital discharge follow-up  Acute bronchitis, unspecified organism - Plan: albuterol (VENTOLIN HFA) 108 (90 Base) MCG/ACT inhaler  -I agree with diagnosis of acute bronchitis secondary to likely viral organism. -Agree with completing a Medrol Dosepak and using albuterol as needed. -I have explained to her that I cannot make a diagnosis of asthma while she is acutely ill.  Will have her come back in 3 weeks to see how she is doing  Time spent: 33 minutes reviewing ED charts, interviewing and examining patient via aid of interpreter, formulating plan of care    Jahleel Stroschein Isaac Bliss, MD [facility]

## 2021-05-06 ENCOUNTER — Other Ambulatory Visit: Payer: Self-pay | Admitting: Neurology

## 2021-05-07 LAB — CULTURE, GROUP A STREP (THRC)

## 2021-05-13 ENCOUNTER — Encounter: Payer: Self-pay | Admitting: Internal Medicine

## 2021-05-13 ENCOUNTER — Other Ambulatory Visit: Payer: Self-pay | Admitting: Internal Medicine

## 2021-05-13 ENCOUNTER — Other Ambulatory Visit (INDEPENDENT_AMBULATORY_CARE_PROVIDER_SITE_OTHER): Payer: Medicaid Other

## 2021-05-13 DIAGNOSIS — E611 Iron deficiency: Secondary | ICD-10-CM

## 2021-05-13 DIAGNOSIS — E559 Vitamin D deficiency, unspecified: Secondary | ICD-10-CM

## 2021-05-13 LAB — VITAMIN D 25 HYDROXY (VIT D DEFICIENCY, FRACTURES): VITD: 27.63 ng/mL — ABNORMAL LOW (ref 30.00–100.00)

## 2021-05-13 LAB — CBC WITH DIFFERENTIAL/PLATELET
Basophils Absolute: 0 10*3/uL (ref 0.0–0.1)
Basophils Relative: 0.2 % (ref 0.0–3.0)
Eosinophils Absolute: 0.1 10*3/uL (ref 0.0–0.7)
Eosinophils Relative: 0.9 % (ref 0.0–5.0)
HCT: 43.2 % (ref 36.0–46.0)
Hemoglobin: 14.2 g/dL (ref 12.0–15.0)
Lymphocytes Relative: 42.9 % (ref 12.0–46.0)
Lymphs Abs: 4.6 10*3/uL — ABNORMAL HIGH (ref 0.7–4.0)
MCHC: 32.9 g/dL (ref 30.0–36.0)
MCV: 81.8 fl (ref 78.0–100.0)
Monocytes Absolute: 0.6 10*3/uL (ref 0.1–1.0)
Monocytes Relative: 5.8 % (ref 3.0–12.0)
Neutro Abs: 5.4 10*3/uL (ref 1.4–7.7)
Neutrophils Relative %: 50.2 % (ref 43.0–77.0)
Platelets: 416 10*3/uL — ABNORMAL HIGH (ref 150.0–400.0)
RBC: 5.28 Mil/uL — ABNORMAL HIGH (ref 3.87–5.11)
RDW: 14.7 % (ref 11.5–15.5)
WBC: 10.7 10*3/uL — ABNORMAL HIGH (ref 4.0–10.5)

## 2021-05-13 MED ORDER — VITAMIN D (ERGOCALCIFEROL) 1.25 MG (50000 UNIT) PO CAPS: 50000.0000 [IU] | ORAL_CAPSULE | ORAL | 0 refills | Status: AC

## 2021-05-14 ENCOUNTER — Encounter: Payer: Self-pay | Admitting: Internal Medicine

## 2021-05-14 ENCOUNTER — Other Ambulatory Visit: Payer: Self-pay | Admitting: Internal Medicine

## 2021-05-14 DIAGNOSIS — E559 Vitamin D deficiency, unspecified: Secondary | ICD-10-CM

## 2021-05-18 ENCOUNTER — Ambulatory Visit (HOSPITAL_COMMUNITY)
Admission: EM | Admit: 2021-05-18 | Discharge: 2021-05-18 | Disposition: A | Discharge status: Home / Self Care | Payer: Medicaid Other | Attending: Physician Assistant | Admitting: Physician Assistant

## 2021-05-18 ENCOUNTER — Other Ambulatory Visit: Payer: Self-pay

## 2021-05-18 ENCOUNTER — Encounter (HOSPITAL_COMMUNITY): Payer: Self-pay

## 2021-05-18 DIAGNOSIS — J40 Bronchitis, not specified as acute or chronic: Secondary | ICD-10-CM

## 2021-05-18 DIAGNOSIS — J029 Acute pharyngitis, unspecified: Secondary | ICD-10-CM

## 2021-05-18 DIAGNOSIS — J329 Chronic sinusitis, unspecified: Secondary | ICD-10-CM | POA: Diagnosis not present

## 2021-05-18 DIAGNOSIS — J302 Other seasonal allergic rhinitis: Secondary | ICD-10-CM | POA: Diagnosis not present

## 2021-05-18 DIAGNOSIS — H9202 Otalgia, left ear: Secondary | ICD-10-CM

## 2021-05-18 MED ORDER — AMOXICILLIN-POT CLAVULANATE 875-125 MG PO TABS: 1.0000 | ORAL_TABLET | Freq: Two times a day (BID) | ORAL | 0 refills | Status: AC

## 2021-05-18 MED ORDER — FLUTICASONE PROPIONATE 50 MCG/ACT NA SUSP: 1.0000 | Freq: Every day | NASAL | 0 refills | Status: DC

## 2021-05-18 MED ORDER — CETIRIZINE HCL 10 MG PO TABS: 10.0000 mg | ORAL_TABLET | Freq: Every day | ORAL | 1 refills | Status: DC

## 2021-05-18 NOTE — ED Provider Notes (Signed)
[facility]    CSN: [id number] Arrival date & time: 05/18/21  0846      History   Chief Complaint Chief Complaint  Patient presents with   Sore Throat    HPI Deborah Lawson is a 31 y.o. female.   Patient presents today with a several day history of URI symptoms.  She is Arabic speaking and video interpreter was utilized during visit.  She reports sore throat and left ear pain.  Reports that she was experiencing similar symptoms in the weeks prior and had taken some amoxicillin that was left over from previous prescription but did not complete this course.  This provided relief but not resolution of symptoms.  She has not been trying any additional over-the-counter medication.  She does have a history of reactive airway disease likely asthma which has improved with albuterol.  She is followed by primary care but has not seen an allergist.  She is not taking any allergy medication at this time.  Denies any known sick contacts as she does have small kids in school.  She denies any significant past medical history outside of likely asthma; does not smoke.  She does not believe she is pregnant.  She reports otalgia and sore throat pain are significant and interfere with her ability to perform daily activities.  She has not seen ENT or had any sinus surgery.   Past Medical History:  Diagnosis Date   Gestational diabetes    Gestational hypertension    Medical history non-contributory     Patient Active Problem List   Diagnosis Date Noted   Vitamin D deficiency 11/11/2020   Iron deficiency 11/11/2020   Gestational diabetes    Gestational hypertension    Breech presentation of fetus 10/26/2019   Language barrier 10/12/2019   Malpresentation before onset of labor 10/12/2019    Past Surgical History:  Procedure Laterality Date   CESAREAN SECTION N/A 10/26/2019   Procedure: CESAREAN SECTION;  Surgeon: Donnamae Jude, MD;  Location: [facility];  Service: Obstetrics;   Laterality: N/A;   MANDIBLE SURGERY      OB History     Gravida  3   Para  3   Term  3   Preterm  0   AB  0   Living  3      SAB  0   IAB  0   Ectopic  0   Multiple  0   Live Births  3            Home Medications    Prior to Admission medications   Medication Sig Start Date End Date Taking? Authorizing Provider  cetirizine (ZYRTEC ALLERGY) 10 MG tablet Take 1 tablet (10 mg total) by mouth daily. 05/18/21  Yes Kadeen Sroka K, PA-C  fluticasone (FLONASE) 50 MCG/ACT nasal spray Place 1 spray into both nostrils daily. 05/18/21  Yes Carmelle Bamberg K, PA-C  Vitamin D, Ergocalciferol, (DRISDOL) 1.25 MG (50000 UNIT) CAPS capsule Take 1 capsule (50,000 Units total) by mouth every 7 (seven) days for 12 doses. 05/13/21 07/30/21  Isaac Bliss, Rayford Halsted, MD  albuterol (VENTOLIN HFA) 108 (90 Base) MCG/ACT inhaler Inhale 1-2 puffs into the lungs every 6 (six) hours as needed for wheezing or shortness of breath. 05/05/21   Isaac Bliss, Rayford Halsted, MD  amoxicillin-clavulanate (AUGMENTIN) 875-125 MG tablet Take 1 tablet by mouth every 12 (twelve) hours for 5 days. 05/18/21 05/23/21  Diondra Pines, Derry Skill, PA-C  gabapentin (NEURONTIN) 300 MG  capsule 1 capsule twice daily 02/09/21   Metta Clines R, DO  ibuprofen (ADVIL) 800 MG tablet Take 1 tablet (800 mg total) by mouth 3 (three) times daily. 01/09/21   Hughie Closs, PA-C  Iron, Ferrous Sulfate, 325 (65 Fe) MG TABS Take 325 mg by mouth daily. 11/11/20   Isaac Bliss, Rayford Halsted, MD  NIFEdipine (PROCARDIA XL) 30 MG 24 hr tablet Take 1 tablet (30 mg total) by mouth daily. 11/05/19 02/18/20  Julianne Handler, CNM    Family History Family History  Problem Relation Age of Onset   Hypertension Father     Social History Social History   Tobacco Use   Smoking status: Never   Smokeless tobacco: Never  Vaping Use   Vaping Use: Never used  Substance Use Topics   Alcohol use: Never   Drug use: Never     Allergies   Patient has no  known allergies.   Review of Systems Review of Systems  Constitutional:  Positive for activity change. Negative for appetite change, fatigue and fever.  HENT:  Positive for congestion, ear pain, postnasal drip, sinus pressure and sore throat. Negative for sneezing.   Respiratory:  Positive for cough. Negative for shortness of breath.   Cardiovascular:  Negative for chest pain.  Gastrointestinal:  Negative for abdominal pain, diarrhea, nausea and vomiting.  Neurological:  Negative for dizziness, light-headedness and headaches.    Physical Exam Triage Vital Signs ED Triage Vitals [05/18/21 0916]  Enc Vitals Group     BP 133/79     Pulse Rate (!) 101     Resp 18     Temp 98.2 F (36.8 C)     Temp Source Oral     SpO2 100 %     Weight      Height      Head Circumference      Peak Flow      Pain Score 8     Pain Loc      Pain Edu?      Excl. in [place]?    No data found.  Updated Vital Signs BP 133/79 (BP Location: Left Arm)    Pulse (!) 101    Temp 98.2 F (36.8 C) (Oral)    Resp 18    SpO2 100%   Visual Acuity Right Eye Distance:   Left Eye Distance:   Bilateral Distance:    Right Eye Near:   Left Eye Near:    Bilateral Near:     Physical Exam Vitals reviewed.  Constitutional:      General: She is awake. She is not in acute distress.    Appearance: Normal appearance. She is well-developed. She is not ill-appearing.     Comments: Very pleasant female appears stated age in no acute distress sitting comfortably in exam room  HENT:     Head: Normocephalic and atraumatic.     Right Ear: Ear canal and external ear normal. A middle ear effusion is present. Tympanic membrane is not erythematous or bulging.     Left Ear: Ear canal and external ear normal. A middle ear effusion is present. Tympanic membrane is not erythematous or bulging.     Nose:     Right Sinus: Maxillary sinus tenderness present. No frontal sinus tenderness.     Left Sinus: Maxillary sinus tenderness  present. No frontal sinus tenderness.     Mouth/Throat:     Pharynx: Uvula midline. Posterior oropharyngeal erythema present. No oropharyngeal exudate.  Comments: Moderate erythema and drainage in posterior oropharynx Cardiovascular:     Rate and Rhythm: Normal rate and regular rhythm.     Heart sounds: Normal heart sounds, S1 normal and S2 normal. No murmur heard. Pulmonary:     Effort: Pulmonary effort is normal.     Breath sounds: Normal breath sounds. No wheezing, rhonchi or rales.     Comments: Clear to auscultation bilaterally Psychiatric:        Behavior: Behavior is cooperative.     UC Treatments / Results  Labs (all labs ordered are listed, but only abnormal results are displayed) Labs Reviewed - No data to display  EKG   Radiology No results found.  Procedures Procedures (including critical care time)  Medications Ordered in UC Medications - No data to display  Initial Impression / Assessment and Plan / UC Course  I have reviewed the triage vital signs and the nursing notes.  Pertinent labs & imaging results that were available during my care of the patient were reviewed by me and considered in my medical decision making (see chart for details).     Given symptoms of been intermittent for several weeks viral testing was deferred.  Patient reports that she had been taking amoxicillin previously prescribed with improvement but not resolution of symptoms.  She is still out of this medication so additional 5 days was sent to complete 7-day course based on previously taking medication.  Discussed that seasonal allergies are likely contributing to symptoms and was started on Zyrtec and Flonase for symptom relief.  She is to gargle with warm salt water multiple times a day to help manage sore throat symptoms.  Recommended she rest and drink plenty of fluid.  Encouraged her to follow-up with her primary care provider as she may benefit from seeing an ENT versus allergist  given recurrent/frequent symptoms.  Discussed that if she has any alarm symptoms including high fever, chest pain, shortness of breath, nausea, vomiting, weakness she is to be seen immediately.  Strict return precautions given to which she expressed understanding.  Final Clinical Impressions(s) / UC Diagnoses   Final diagnoses:  Sinobronchitis  Sore throat  Otalgia, left  Seasonal allergies     Discharge Instructions      Complete course of Augmentin as prescribed.  Start Zyrtec daily.  Use Flonase daily.  Follow-up with your primary care provider if symptoms do not improving quickly.  As we discussed, you may benefit from seeing an ear nose and throat doctor or allergist but they would need to refer you.  If you have any worsening symptoms you need to be seen immediately.  Gargle with warm salt water and use Tylenol/ibuprofen for pain relief.  Follow-up with PCP as we discussed.     ED Prescriptions     Medication Sig Dispense Auth. Provider   amoxicillin-clavulanate (AUGMENTIN) 875-125 MG tablet Take 1 tablet by mouth every 12 (twelve) hours for 5 days. 10 tablet Ranay Ketter K, PA-C   cetirizine (ZYRTEC ALLERGY) 10 MG tablet Take 1 tablet (10 mg total) by mouth daily. 30 tablet Porcha Deblanc K, PA-C   fluticasone (FLONASE) 50 MCG/ACT nasal spray Place 1 spray into both nostrils daily. 16 g Schae Cando K, PA-C      PDMP not reviewed this encounter.   Terrilee Croak, PA-C 05/18/21 [id number]

## 2021-05-18 NOTE — ED Triage Notes (Signed)
Pt c/o sore throat and lt ear pain x2 days. States is currently on amoxicillin for her cough. ?

## 2021-05-18 NOTE — Discharge Instructions (Signed)
Complete course of Augmentin as prescribed.  Start Zyrtec daily.  Use Flonase daily.  Follow-up with your primary care provider if symptoms do not improving quickly.  As we discussed, you may benefit from seeing an ear nose and throat doctor or allergist but they would need to refer you.  If you have any worsening symptoms you need to be seen immediately.  Gargle with warm salt water and use Tylenol/ibuprofen for pain relief.  Follow-up with PCP as we discussed. ?

## 2021-05-27 ENCOUNTER — Ambulatory Visit (INDEPENDENT_AMBULATORY_CARE_PROVIDER_SITE_OTHER): Payer: Medicaid Other | Admitting: Internal Medicine

## 2021-05-27 ENCOUNTER — Ambulatory Visit: Payer: Medicaid Other | Admitting: Internal Medicine

## 2021-05-27 ENCOUNTER — Encounter: Payer: Self-pay | Admitting: Internal Medicine

## 2021-05-27 VITALS — BP 130/80 | HR 108 | Temp 98.4°F | Wt 197.9 lb

## 2021-05-27 DIAGNOSIS — H66002 Acute suppurative otitis media without spontaneous rupture of ear drum, left ear: Secondary | ICD-10-CM

## 2021-05-27 MED ORDER — AMOXICILLIN-POT CLAVULANATE 875-125 MG PO TABS: 1.0000 | ORAL_TABLET | Freq: Two times a day (BID) | ORAL | 0 refills | Status: AC

## 2021-05-27 NOTE — Progress Notes (Signed)
? ? ? ?Acute office Visit ? ? ? ? ?This visit occurred during the SARS-CoV-2 public health emergency.  Safety protocols were in place, including screening questions prior to the visit, additional usage of staff PPE, and extensive cleaning of exam room while observing appropriate contact time as indicated for disinfecting solutions.  ? ? ?CC/Reason for Visit: Left ear pain ? ?HPI: Deborah Lawson is a 31 y.o. female who is coming in today for the above mentioned reasons.  For the past 4 days she has been having a sensation of left ear fullness and pain, no discharge.  She also has sinus congestion and runny nose and other URI symptoms.  No fever.  Children have been sick as well. ? ?Past Medical/Surgical History: ?Past Medical History:  ?Diagnosis Date  ? Gestational diabetes   ? Gestational hypertension   ? Medical history non-contributory   ? ? ?Past Surgical History:  ?Procedure Laterality Date  ? CESAREAN SECTION N/A 10/26/2019  ? Procedure: CESAREAN SECTION;  Surgeon: Reva Bores, MD;  Location: [facility];  Service: Obstetrics;  Laterality: N/A;  ? MANDIBLE SURGERY    ? ? ?Social History: ? reports that she has never smoked. She has never used smokeless tobacco. She reports that she does not drink alcohol and does not use drugs. ? ?Allergies: ?No Known Allergies ? ?Family History:  ?Family History  ?Problem Relation Age of Onset  ? Hypertension Father   ? ? ? ?Current Outpatient Medications:  ?  albuterol (VENTOLIN HFA) 108 (90 Base) MCG/ACT inhaler, Inhale 1-2 puffs into the lungs every 6 (six) hours as needed for wheezing or shortness of breath., Disp: 8 g, Rfl: 0 ?  amoxicillin-clavulanate (AUGMENTIN) 875-125 MG tablet, Take 1 tablet by mouth 2 (two) times daily for 7 days., Disp: 20 tablet, Rfl: 0 ?  cetirizine (ZYRTEC ALLERGY) 10 MG tablet, Take 1 tablet (10 mg total) by mouth daily., Disp: 30 tablet, Rfl: 1 ?  fluticasone (FLONASE) 50 MCG/ACT nasal spray, Place 1 spray into both nostrils daily.,  Disp: 16 g, Rfl: 0 ?  gabapentin (NEURONTIN) 300 MG capsule, 1 capsule twice daily, Disp: 60 capsule, Rfl: 5 ?  ibuprofen (ADVIL) 800 MG tablet, Take 1 tablet (800 mg total) by mouth 3 (three) times daily., Disp: 21 tablet, Rfl: 0 ?  Iron, Ferrous Sulfate, 325 (65 Fe) MG TABS, Take 325 mg by mouth daily., Disp: 30 tablet, Rfl:  ?  Vitamin D, Ergocalciferol, (DRISDOL) 1.25 MG (50000 UNIT) CAPS capsule, Take 1 capsule (50,000 Units total) by mouth every 7 (seven) days for 12 doses., Disp: 12 capsule, Rfl: 0 ? ?Review of Systems:  ?Constitutional: Denies fever, chills, diaphoresis, appetite change and fatigue.  ?HEENT: Denies photophobia, eye pain, redness, mouth sores, trouble swallowing, neck pain, neck stiffness and tinnitus.   ?Respiratory: Denies SOB, DOE, cough, chest tightness,  and wheezing.   ?Cardiovascular: Denies chest pain, palpitations and leg swelling.  ?Gastrointestinal: Denies nausea, vomiting, abdominal pain, diarrhea, constipation, blood in stool and abdominal distention.  ?Genitourinary: Denies dysuria, urgency, frequency, hematuria, flank pain and difficulty urinating.  ?Endocrine: Denies: hot or cold intolerance, sweats, changes in hair or nails, polyuria, polydipsia. ?Musculoskeletal: Denies myalgias, back pain, joint swelling, arthralgias and gait problem.  ?Skin: Denies pallor, rash and wound.  ?Neurological: Denies dizziness, seizures, syncope, weakness, light-headedness, numbness and headaches.  ?Hematological: Denies adenopathy. Easy bruising, personal or family bleeding history  ?Psychiatric/Behavioral: Denies suicidal ideation, mood changes, confusion, nervousness, sleep disturbance and agitation ? ? ? ?  Physical Exam: ?Vitals:  ? 05/27/21 0722  ?BP: 130/80  ?Pulse: (!) 108  ?Temp: 98.4 ?F (36.9 ?C)  ?TempSrc: Oral  ?SpO2: 98%  ?Weight: 197 lb 14.4 oz (89.8 kg)  ? ? ?Body mass index is 35.06 kg/m?. ? ?\ ?Constitutional: NAD, calm, comfortable ?Eyes: PERRL, lids and conjunctivae  normal ?ENMT: Mucous membranes are moist. Posterior pharynx is erythematous but clear of any exudate or lesions. Normal dentition. Tympanic membrane is pearly white, no erythema or bulging on the right, left tympanic membrane is erythematous. ?Neck: normal, supple, no masses, no thyromegaly ?Respiratory: clear to auscultation bilaterally, no wheezing, no crackles. Normal respiratory effort. No accessory muscle use.  ?Cardiovascular: Regular rate and rhythm, no murmurs / rubs / gallops. No extremity edema.  ?Psychiatric: Normal judgment and insight. Alert and oriented x 3. Normal mood.  ? ? ?Impression and Plan: ? ?Acute suppurative otitis media of left ear without spontaneous rupture of tympanic membrane, recurrence not specified  ?- Plan: amoxicillin-clavulanate (AUGMENTIN) 875-125 MG tablet ?-She has also been advised to use antihistamines and guaifenesin to clear her nasal and sinus congestion. ? ?Time spent: 21 minutes reviewing chart, interviewing and examining patient and formulating plan of care. ? ? ?Patient Instructions  ?-Nice seeing you today!! ? ?-Augmentin 1 tablet twice daily for 7 days. ? ? ? ? ? ?Chaya Jan, MD ?[facility] ? ? ?

## 2021-05-27 NOTE — Patient Instructions (Signed)
-  Nice seeing you today!! ? ?-Augmentin 1 tablet twice daily for 7 days. ? ? ?

## 2021-06-03 ENCOUNTER — Ambulatory Visit (INDEPENDENT_AMBULATORY_CARE_PROVIDER_SITE_OTHER): Payer: Medicaid Other | Admitting: Internal Medicine

## 2021-06-03 ENCOUNTER — Encounter: Payer: Self-pay | Admitting: Internal Medicine

## 2021-06-03 VITALS — BP 110/80 | HR 110 | Temp 98.0°F | Wt 198.2 lb

## 2021-06-03 DIAGNOSIS — M542 Cervicalgia: Secondary | ICD-10-CM | POA: Diagnosis not present

## 2021-06-03 DIAGNOSIS — J209 Acute bronchitis, unspecified: Secondary | ICD-10-CM

## 2021-06-03 DIAGNOSIS — R079 Chest pain, unspecified: Secondary | ICD-10-CM | POA: Diagnosis not present

## 2021-06-03 DIAGNOSIS — J4520 Mild intermittent asthma, uncomplicated: Secondary | ICD-10-CM | POA: Diagnosis not present

## 2021-06-03 DIAGNOSIS — M25512 Pain in left shoulder: Secondary | ICD-10-CM

## 2021-06-03 MED ORDER — ALBUTEROL SULFATE HFA 108 (90 BASE) MCG/ACT IN AERS: 1.0000 | INHALATION_SPRAY | Freq: Four times a day (QID) | RESPIRATORY_TRACT | 0 refills | Status: DC | PRN

## 2021-06-03 NOTE — Progress Notes (Signed)
? ? ? ?Acute office Visit ? ? ? ? ?This visit occurred during the SARS-CoV-2 public health emergency.  Safety protocols were in place, including screening questions prior to the visit, additional usage of staff PPE, and extensive cleaning of exam room while observing appropriate contact time as indicated for disinfecting solutions.  ? ? ?CC/Reason for Visit: Neck, shoulder, chest pain, wheezing ? ?HPI: Deborah Lawson is a 31 y.o. female who is coming in today for the above mentioned reasons.  She was seen last week in the context of URI symptoms and diagnosed with left bacterial otitis media, she has not yet completed her course of Augmentin.  She is requesting an albuterol inhaler and a referral to an allergist.  She states she was diagnosed with reactive airway disease and is concerned.  She has also been having significant allergy symptoms of watery eyes, runny nose.  She started physical therapy.  Ever since then she has been having some left-sided neck, upper shoulder and precordial pain.  All on the left side of her chest.  She is concerned about her heart.  It does not hurt with deep inspiration, it is painful to palpation of her neck and trapezius muscles.  She is still breast feeding.  Visit is aided by in person translator today. ? ?Past Medical/Surgical History: ?Past Medical History:  ?Diagnosis Date  ? Gestational diabetes   ? Gestational hypertension   ? Medical history non-contributory   ? ? ?Past Surgical History:  ?Procedure Laterality Date  ? CESAREAN SECTION N/A 10/26/2019  ? Procedure: CESAREAN SECTION;  Surgeon: Donnamae Jude, MD;  Location: [facility];  Service: Obstetrics;  Laterality: N/A;  ? MANDIBLE SURGERY    ? ? ?Social History: ? reports that she has never smoked. She has never used smokeless tobacco. She reports that she does not drink alcohol and does not use drugs. ? ?Allergies: ?No Known Allergies ? ?Family History:  ?Family History  ?Problem Relation Age of Onset  ? Hypertension  Father   ? ? ? ?Current Outpatient Medications:  ?  albuterol (VENTOLIN HFA) 108 (90 Base) MCG/ACT inhaler, Inhale 1-2 puffs into the lungs every 6 (six) hours as needed for wheezing or shortness of breath., Disp: 8 g, Rfl: 0 ?  amoxicillin-clavulanate (AUGMENTIN) 875-125 MG tablet, Take 1 tablet by mouth 2 (two) times daily for 7 days., Disp: 20 tablet, Rfl: 0 ?  cetirizine (ZYRTEC ALLERGY) 10 MG tablet, Take 1 tablet (10 mg total) by mouth daily., Disp: 30 tablet, Rfl: 1 ?  fluticasone (FLONASE) 50 MCG/ACT nasal spray, Place 1 spray into both nostrils daily., Disp: 16 g, Rfl: 0 ?  gabapentin (NEURONTIN) 300 MG capsule, 1 capsule twice daily, Disp: 60 capsule, Rfl: 5 ?  ibuprofen (ADVIL) 800 MG tablet, Take 1 tablet (800 mg total) by mouth 3 (three) times daily., Disp: 21 tablet, Rfl: 0 ?  Iron, Ferrous Sulfate, 325 (65 Fe) MG TABS, Take 325 mg by mouth daily., Disp: 30 tablet, Rfl:  ?  Vitamin D, Ergocalciferol, (DRISDOL) 1.25 MG (50000 UNIT) CAPS capsule, Take 1 capsule (50,000 Units total) by mouth every 7 (seven) days for 12 doses., Disp: 12 capsule, Rfl: 0 ? ?Review of Systems:  ?Constitutional: Denies fever, chills, diaphoresis, appetite change and fatigue.  ?HEENT: Denies photophobia, eye pain, redness, hearing loss, mouth sores, trouble swallowing, neck pain, neck stiffness and tinnitus.   ?Respiratory: Denies SOB, DOE, chest tightness. ?Cardiovascular: Denies chest pain, palpitations and leg swelling.  ?Gastrointestinal: Denies nausea,  vomiting, abdominal pain, diarrhea, constipation, blood in stool and abdominal distention.  ?Genitourinary: Denies dysuria, urgency, frequency, hematuria, flank pain and difficulty urinating.  ?Endocrine: Denies: hot or cold intolerance, sweats, changes in hair or nails, polyuria, polydipsia. ?Musculoskeletal: Denies myalgias, back pain, joint swelling, arthralgias and gait problem.  ?Skin: Denies pallor, rash and wound.  ?Neurological: Denies dizziness, seizures, syncope,  weakness, light-headedness, numbness and headaches.  ?Hematological: Denies adenopathy. Easy bruising, personal or family bleeding history  ?Psychiatric/Behavioral: Denies suicidal ideation, mood changes, confusion, nervousness, sleep disturbance and agitation ? ? ? ?Physical Exam: ?Vitals:  ? 06/03/21 0719  ?BP: 110/80  ?Pulse: (!) 110  ?Temp: 98 ?F (36.7 ?C)  ?TempSrc: Oral  ?SpO2: 98%  ?Weight: 198 lb 3.2 oz (89.9 kg)  ? ? ?Body mass index is 35.11 kg/m?. ? ? ?Constitutional: NAD, calm, comfortable ?Eyes: PERRL, lids and conjunctivae normal ?ENMT: Mucous membranes are moist.  ?Respiratory: clear to auscultation bilaterally, no wheezing, no crackles. Normal respiratory effort. No accessory muscle use.  ?Cardiovascular: Regular rate and rhythm, no murmurs / rubs / gallops. No extremity edema. ?Neurologic: Grossly intact and nonfocal ?Psychiatric: Normal judgment and insight. Alert and oriented x 3. Normal mood.  ? ? ?Impression and Plan: ? ?Neck pain ?Acute pain of left shoulder ?Chest pain, unspecified type ?-This all appears to be musculoskeletal especially with pain to palpation of her neck, trapezius and pectoral muscles.  She has recently started a new PT regimen.  Have advised observation, icing, as needed NSAIDs. ? ?Reactive airway disease ? Plan: Ambulatory referral to Allergy per patient request, albuterol (VENTOLIN HFA) 108 (90 Base) MCG/ACT inhaler ? ?Time spent: 22 minutes reviewing chart, interviewing and examining patient and formulating plan of care. ? ? ? ? ?Lelon Frohlich, MD ?[facility] ? ? ?

## 2021-06-09 ENCOUNTER — Other Ambulatory Visit: Payer: Self-pay

## 2021-06-09 ENCOUNTER — Ambulatory Visit (INDEPENDENT_AMBULATORY_CARE_PROVIDER_SITE_OTHER): Payer: Medicaid Other | Admitting: Internal Medicine

## 2021-06-09 ENCOUNTER — Encounter: Payer: Self-pay | Admitting: Internal Medicine

## 2021-06-09 VITALS — BP 118/72 | HR 100 | Ht 63.0 in | Wt 197.0 lb

## 2021-06-09 DIAGNOSIS — E237 Disorder of pituitary gland, unspecified: Secondary | ICD-10-CM | POA: Diagnosis not present

## 2021-06-09 DIAGNOSIS — R930 Abnormal findings on diagnostic imaging of skull and head, not elsewhere classified: Secondary | ICD-10-CM

## 2021-06-09 DIAGNOSIS — R9082 White matter disease, unspecified: Secondary | ICD-10-CM

## 2021-06-09 LAB — BASIC METABOLIC PANEL
BUN: 11 mg/dL (ref 6–23)
CO2: 27 mEq/L (ref 19–32)
Calcium: 9.9 mg/dL (ref 8.4–10.5)
Chloride: 100 mEq/L (ref 96–112)
Creatinine, Ser: 0.51 mg/dL (ref 0.40–1.20)
GFR: 126.31 mL/min (ref >60.00)
Glucose, Bld: 116 mg/dL — ABNORMAL HIGH (ref 70–99)
Potassium: 4.1 mEq/L (ref 3.5–5.1)
Sodium: 136 mEq/L (ref 135–145)

## 2021-06-09 LAB — FOLLICLE STIMULATING HORMONE: FSH: 7.2 m[IU]/mL

## 2021-06-09 LAB — TSH: TSH: 2.69 u[IU]/mL (ref 0.35–5.50)

## 2021-06-09 LAB — T4, FREE: Free T4: 0.82 ng/dL (ref 0.60–1.60)

## 2021-06-09 NOTE — Progress Notes (Signed)
? ? ?Name: Deborah Lawson  ?MRN/ DOB: [id number], 12/21/1990    ?Age/ Sex: 31 y.o., female   ? ?PCP: Philip Aspen, Limmie Patricia, MD   ?Reason for Endocrinology Evaluation: Pituitary cysts   ?   ?Date of Initial Endocrinology Evaluation: 08/05/2020  ? ? ?HPI: ?Deborah Lawson is a 31 y.o. female with unremarkable past medical history . The patient presented for initial endocrinology clinic visit on 08/05/2020 for consultative assistance with her pituitary cyst.  ? ?During evaluation of left sided facial pain through neurology she was found to have a 3 mm pituitary cyst during MRI in 06/2020 ?Pituitary work-up has been normal 07/2020 ? ? ?Pt noted atypical left facial pain and migraine headaches since having the nexplanon . Historically she has had occasional headaches but these became worse associated with left visual changes . ? ?Her headaches have improved since removing the Nexplanon 5/11th 2022 ? ?She is S/P C-section in 10/2019 ,this was her 3rd pregnancy.  ?She is currently nursing.  ? ? ? ?SUBJECTIVE:  ? ? ? 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 ? ? ?She was seen at urgent care on 05/18/2021 for sinobronchitis ?She was also seen in urgent care in February for asthma ?She has been undergoing PT for TMJ dysfunction ? ? ?Weight has been stable  ?She is accompanied by her toddler daughter , stopped nursing last week , is having breast engorgement  ?No excessive sweating  ?No recent nausea or vomiting  ?Last ophthalmological exam last month- normal  ?Headaches have been improving , this has been attributed to dental decay  ?No menstruations yet  ?Left leg hurts at times  ? ?Removed nexplanon 07/23/2020 ? ? ? ? ? ?HISTORY:  ?Past Medical History:  ?Past Medical History:  ?Diagnosis Date  ? Gestational diabetes   ? Gestational hypertension   ? Medical history non-contributory   ? ?Past Surgical History:  ?Past Surgical History:  ?Procedure Laterality Date  ? CESAREAN SECTION  N/A 10/26/2019  ? Procedure: CESAREAN SECTION;  Surgeon: Reva Bores, MD;  Location: [facility];  Service: Obstetrics;  Laterality: N/A;  ? MANDIBLE SURGERY    ?  ?Social History:  reports that she has never smoked. She has never used smokeless tobacco. She reports that she does not drink alcohol and does not use drugs. ?Family History: family history includes Hypertension in her father. ? ? ?HOME MEDICATIONS: ?Allergies as of 06/09/2021   ?No Known Allergies ?  ? ?  ?Medication List  ?  ? ?  ? Accurate as of June 09, 2021  8:23 AM. If you have any questions, ask your nurse or doctor.  ?  ?  ? ?  ? ?albuterol 108 (90 Base) MCG/ACT inhaler ?Commonly known as: VENTOLIN HFA ?Inhale 1-2 puffs into the lungs every 6 (six) hours as needed for wheezing or shortness of breath. ?  ?cetirizine 10 MG tablet ?Commonly known as: ZyrTEC Allergy ?Take 1 tablet (10 mg total) by mouth daily. ?  ?fluticasone 50 MCG/ACT nasal spray ?Commonly known as: FLONASE ?Place 1 spray into both nostrils daily. ?  ?gabapentin 300 MG capsule ?Commonly known as: NEURONTIN ?1 capsule twice daily ?  ?ibuprofen 800 MG tablet ?Commonly known as: ADVIL ?Take 1 tablet (800 mg total) by mouth 3 (three) times daily. ?  ?Iron (Ferrous Sulfate) 325 (65 Fe) MG Tabs ?Take 325 mg by mouth daily. ?  ?Vitamin D (Ergocalciferol) 1.25 MG (50000 UNIT) Caps capsule ?  Commonly known as: DRISDOL ?Take 1 capsule (50,000 Units total) by mouth every 7 (seven) days for 12 doses. ?  ? ?  ?  ? ? ?REVIEW OF SYSTEMS: ?A comprehensive ROS was conducted with the patient and is negative except as per HPI ? ? ? ?OBJECTIVE:  ?VS: BP 118/72 (BP Location: Left Arm, Patient Position: Sitting, Cuff Size: Large)   Pulse 100   Ht 5\' 3"  (1.6 m)   Wt 197 lb (89.4 kg)   SpO2 94%   BMI 34.90 kg/m?   ? ?Wt Readings from Last 3 Encounters:  ?06/09/21 197 lb (89.4 kg)  ?06/03/21 198 lb 3.2 oz (89.9 kg)  ?05/27/21 197 lb 14.4 oz (89.8 kg)  ? ? ? ?EXAM: ?General: Pt appears well and is in  NAD  ?Eyes: External eye exam normal without stare, lid lag or exophthalmos.  EOM intact.   ?Neck: General: Supple without adenopathy. ?Thyroid: Thyroid size normal.  No goiter or nodules appreciated.  ?Lungs: Clear with good BS bilat with no rales, rhonchi, or wheezes  ?Heart: Auscultation: RRR.  ?Abdomen: Normoactive bowel sounds, soft, nontender, without masses or organomegaly palpable  ?Extremities:  ?BL LE: No pretibial edema normal ROM and strength.  ?Mental Status: Judgment, insight: Intact ?Orientation: Oriented to time, place, and person ?Mood and affect: No depression, anxiety, or agitation  ? ? ? ?DATA REVIEWED: ? ? Latest Reference Range & Units 06/09/21 08:30  ?Sodium 135 - 145 mEq/L 136  ?Potassium 3.5 - 5.1 mEq/L 4.1  ?Chloride 96 - 112 mEq/L 100  ?CO2 19 - 32 mEq/L 27  ?Glucose 70 - 99 mg/dL 06/11/21 (H)  ?BUN 6 - 23 mg/dL 11  ?Creatinine 0.40 - 1.20 mg/dL 062  ?Calcium 8.4 - 10.5 mg/dL 9.9  ?GFR >60.00 mL/min 126.31  ? ?  ? Latest Reference Range & Units 06/09/21 08:30  ?FSH mIU/ML 7.2  ?Prolactin ng/mL 5.0  ?Glucose 70 - 99 mg/dL 06/11/21 (H)  ?Estradiol pg/mL 23  ?TSH 0.35 - 5.50 uIU/mL 2.69  ?T4,Free(Direct) 0.60 - 1.60 ng/dL 854  ? ? ?ASSESSMENT/PLAN/RECOMMENDATIONS:  ? ?Pituitary Cyst: ? ?- This was an incidental finding on MRI for evaluation of headaches.  ?- Will  repeat pituitary  imaging  ?- No clinical concern for hypo or hyperpituitarism but will evaluate biochemically  ?-Prolactin, TFTs, estradiol and FSH are all appropriate ? ? ?2. Galactorrhea : ? ? ?-She stopped nursing last week, she does have breast engorgement.  We initially discussed cabergoline but given that her prolactin is normal we will hold off on this for now ? ? ? ? ? ?F/Y in 6 months  ? ?Signed electronically by: ?Abby 6.27, MD ? ?[facility]  ?[facility] ?[address] ?[place], [place] ?Phone: [phone] ?FAX: [phone] ? ? ?CC: ?[phone], Philip Aspen, MD ?[id number]  [address] ?[place] ?Phone: [phone] ?Fax: [phone] ? ? ?Return to Endocrinology clinic as below: ?Future Appointments  ?Date Time Provider Department Center  ?07/08/2021 10:30 AM 07/10/2021, DO [facility] None  ?11/09/2021  7:30 AM 11/11/2021, Philip Aspen, MD [id number]  ?  ? ? ? ? ? ?

## 2021-06-10 LAB — PROLACTIN: Prolactin: 5 ng/mL

## 2021-06-10 LAB — ESTRADIOL: Estradiol: 23 pg/mL

## 2021-07-03 ENCOUNTER — Ambulatory Visit
Admission: RE | Admit: 2021-07-03 | Discharge: 2021-07-03 | Disposition: A | Discharge status: Home / Self Care | Payer: Medicaid Other | Source: Ambulatory Visit | Attending: Internal Medicine | Admitting: Internal Medicine

## 2021-07-03 DIAGNOSIS — E237 Disorder of pituitary gland, unspecified: Secondary | ICD-10-CM

## 2021-07-03 IMAGING — MR MR HEAD WO/W CM
12 of 20 series · 32 of 48 positions shown · IV contrast (10 ml multihance)
Comparison: Report from outside brain MRI [DATE] (images
unavailable). Head CT [DATE].

CLINICAL DATA: Provided history: Pituitary abnormality. Brain/CNS
neoplasm, monitor; pituitary adenoma 3 mm.

EXAM:
MRI HEAD WITHOUT AND WITH CONTRAST
TECHNIQUE: Multiplanar, multiecho pulse sequences of the brain and surrounding
structures were obtained without and with intravenous contrast.
CONTRAST:  10mL MULTIHANCE GADOBENATE DIMEGLUMINE 529 MG/ML IV SOLN

[Series 2: T1 · sagittal · 5.0mm · 0.45mm/px · 1 of 22 slices shown (1 of 3)]
[im 1/22]
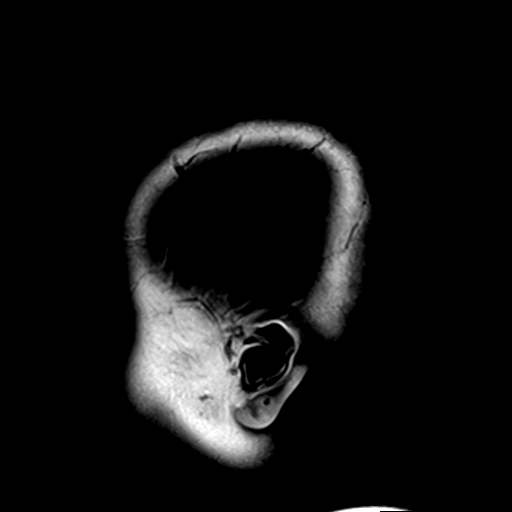

[Series 3: DWI · axial · 3.0mm · 1.80mm/px · z∈[-66,+80]mm · 8 of 100 slices shown]
[im 1/100]
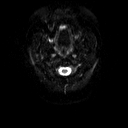
[im 12/100]
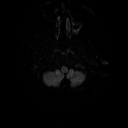
[im 34/100]
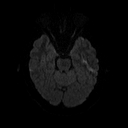
[im 45/100]
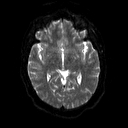
[im 56/100]
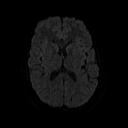
[im 67/100]
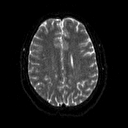
[im 89/100]
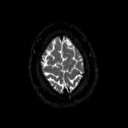
[im 100/100]
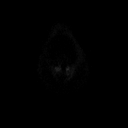

[Series 4: dwi_adc · axial · 3.0mm · 1.80mm/px · z∈[-66,+80]mm · 5 of 50 slices shown]
[im 1/50]
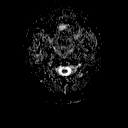
[im 13/50]
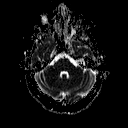
[im 25/50]
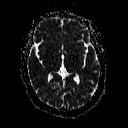
[im 37/50]
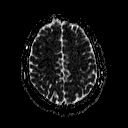
[im 50/50]
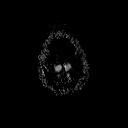

[Series 5: T2 · axial · 5.0mm · 0.36mm/px · z∈[-73,+89]mm · 3 of 26 slices shown (1 of 2)]
[im 1/26]
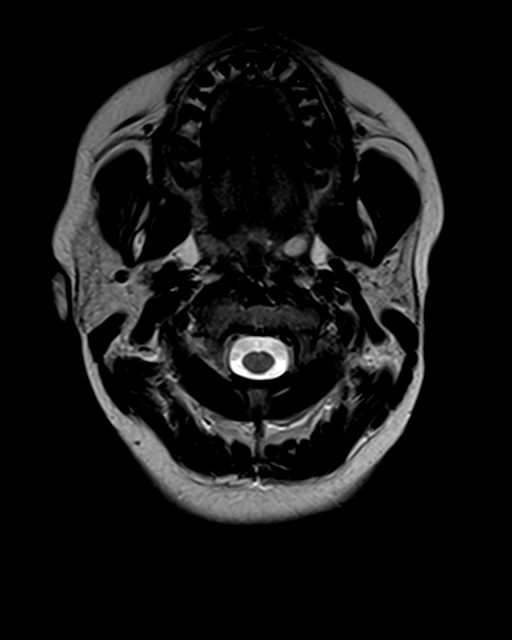
[im 13/26]
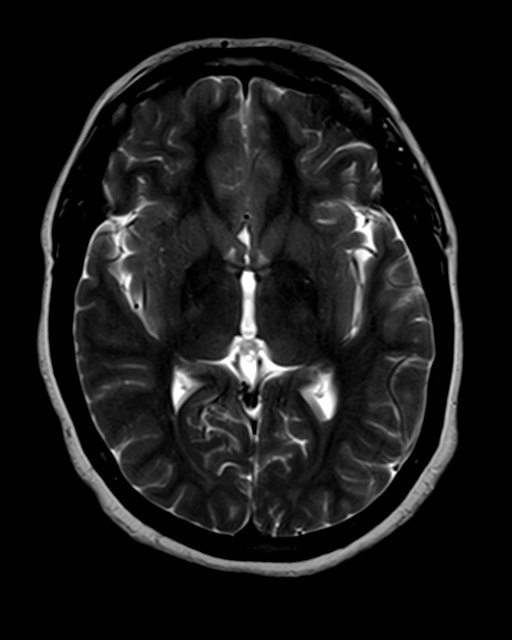
[im 26/26]
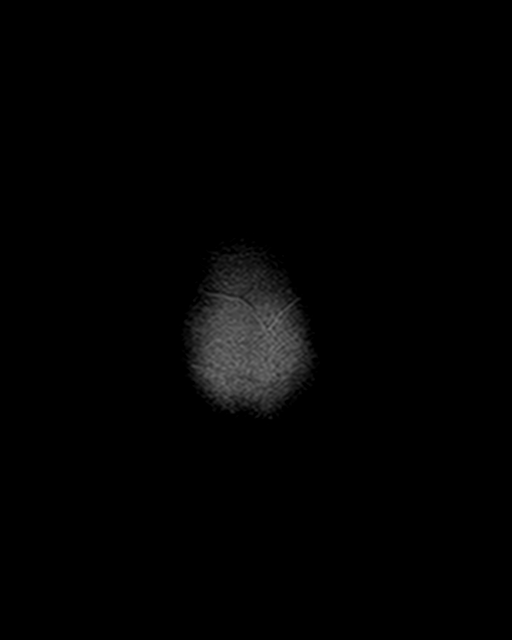

[Series 6: FLAIR · axial · 3.0mm · 0.45mm/px · z∈[-67,+81]mm · 3 of 33 slices shown]
[im 1/33]
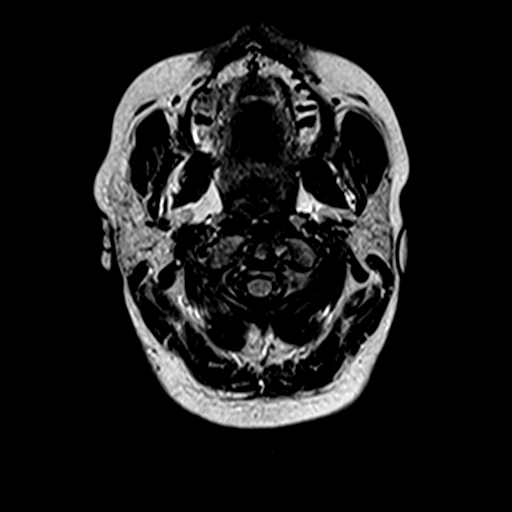
[im 17/33]
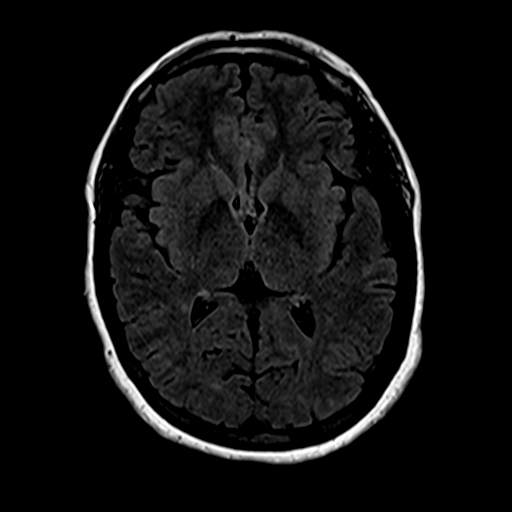
[im 33/33]
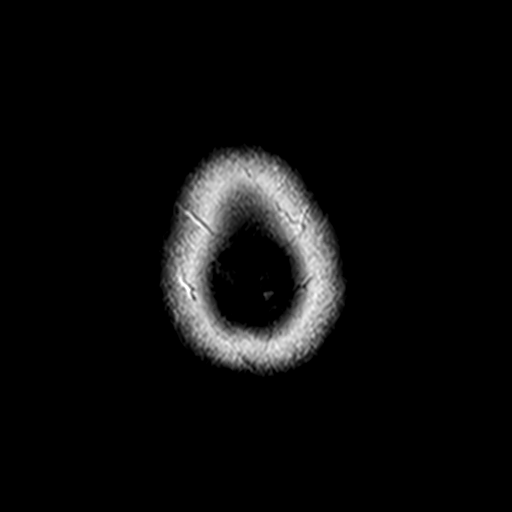

[Series 8: swi_images · axial · 4.0mm · 0.94mm/px · z∈[-63,+76]mm · 4 of 36 slices shown]
[im 1/36]
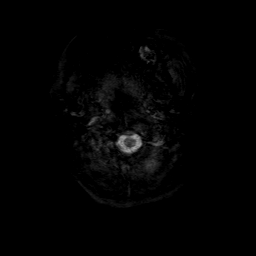
[im 12/36]
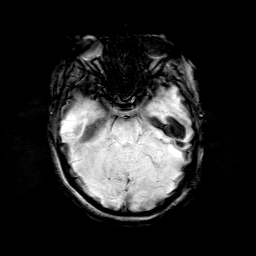
[im 24/36]
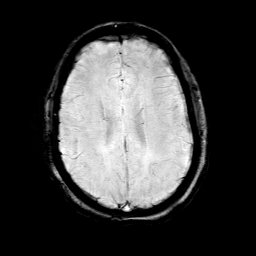
[im 36/36]
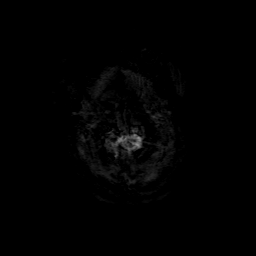

[Series 9: T1 · sagittal · 3.0mm · 0.33mm/px · 1 of 11 slices shown (2 of 3)]
[im 1/11]
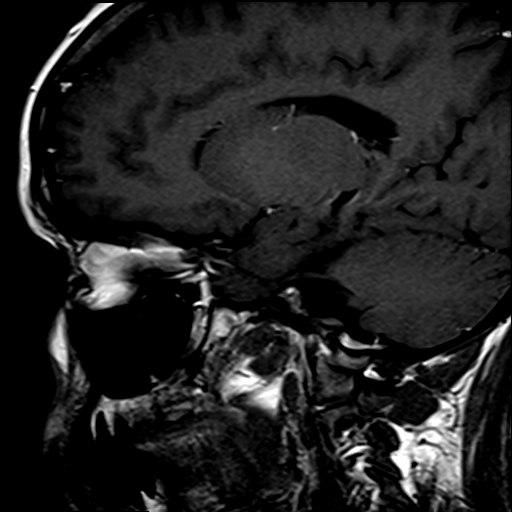

[Series 10: T1 · coronal · 3.0mm · 0.33mm/px · 1 of 11 slices shown (3 of 3)]
[im 1/11]
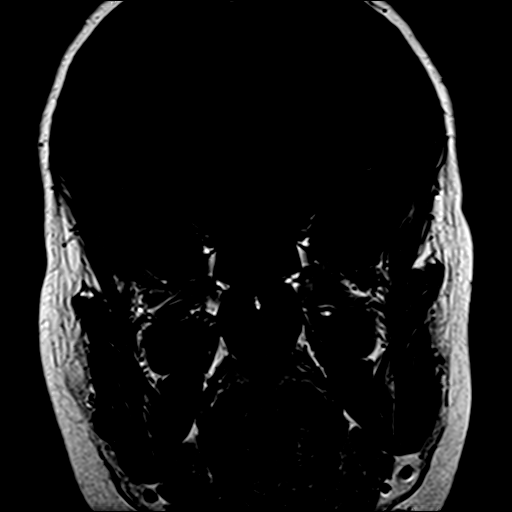

[Series 11: T2 · coronal · 3.0mm · 0.23mm/px · 1 of 11 slices shown (2 of 2)]
[im 1/11]
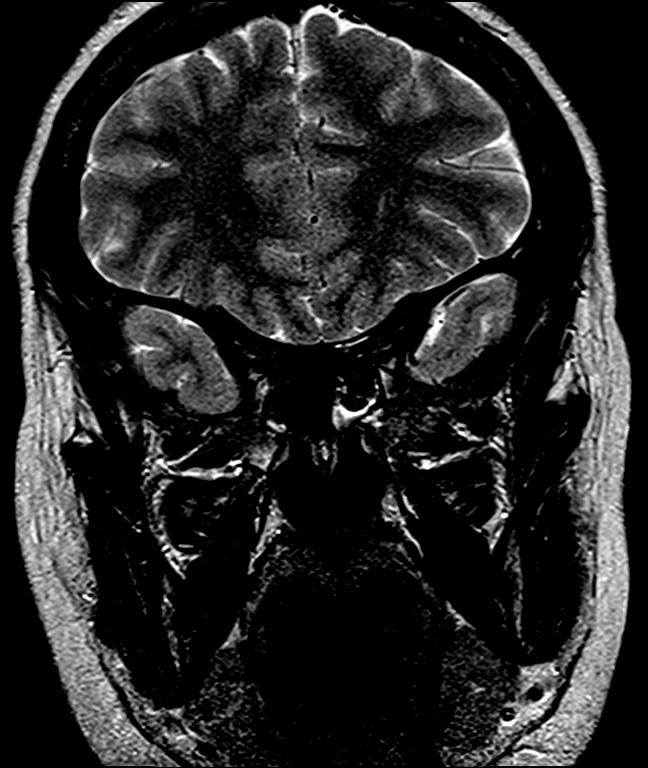

[Series 19: T1 post-contrast · sagittal · 3.0mm · 0.33mm/px · 1 of 11 slices shown (1 of 3)]
[im 1/11]
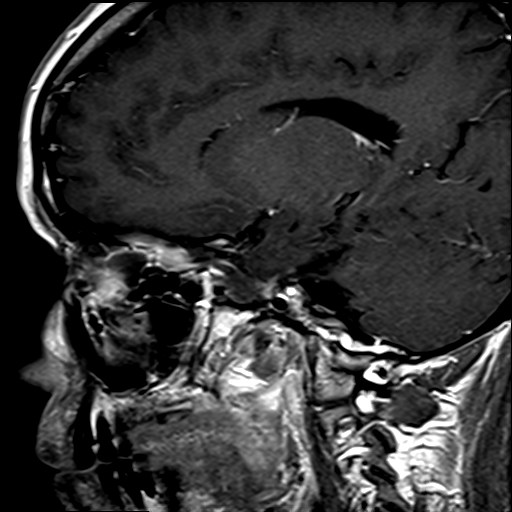

[Series 20: T1 post-contrast · coronal · 3.0mm · 0.33mm/px · 1 of 11 slices shown (2 of 3)]
[im 1/11]
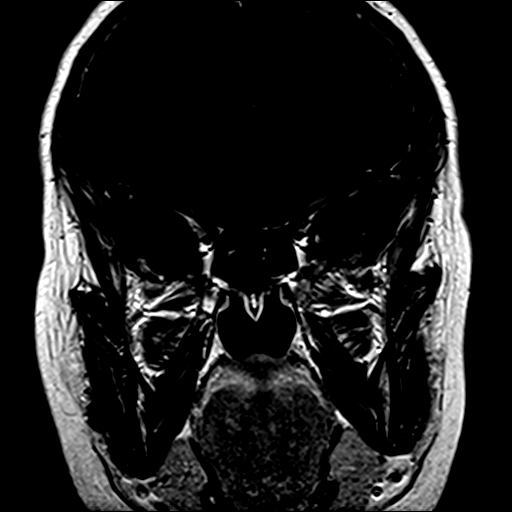

[Series 22: T1 post-contrast · coronal · 5.0mm · 0.45mm/px · 3 of 29 slices shown (3 of 3)]
[im 1/29]
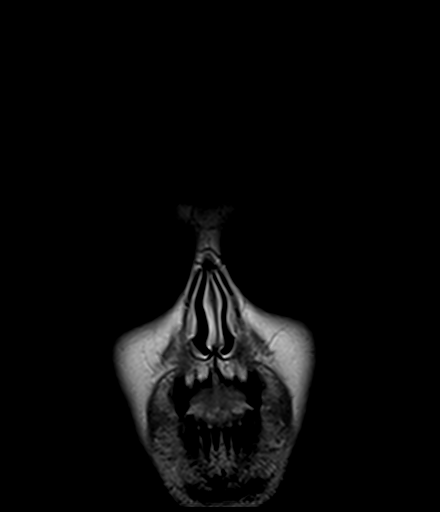
[im 15/29]
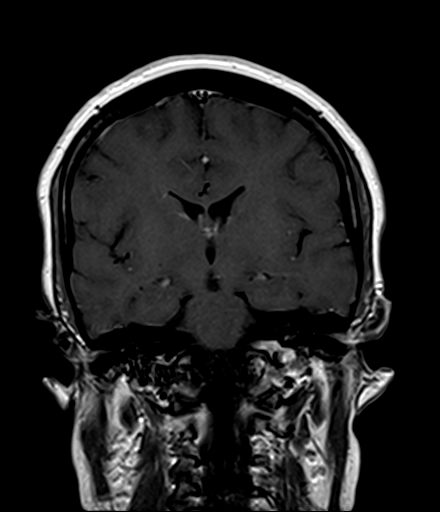
[im 29/29]
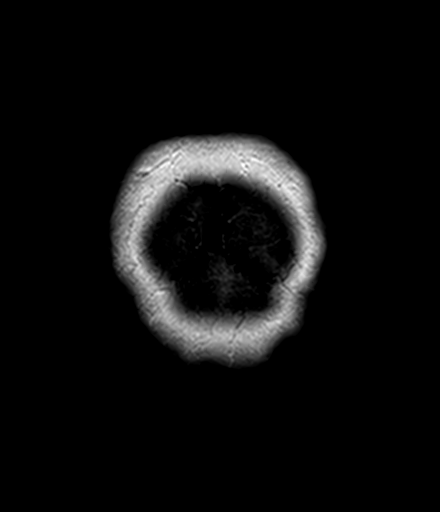

[32 of 48 positions shown; findings below may reference images not displayed]

FINDINGS: Brain:

Dedicated pituitary protocol imaging was performed, including
dynamic post-contrast imaging. 2-3 mm T2 hyperintense and
transiently hypoenhancing focus within the left inferior aspect of
the pituitary gland (for instance as seen on series 11, image 6)
(series 17, image 5)(series 20, image 6). This is compatible with
the provided history of pituitary microadenoma. There is no
cavernous sinus invasion. No suprasellar extension. The pituitary
stalk is midline and is not abnormally thickened.

Cerebral volume is normal.

There are a few small foci of T2 FLAIR hyperintense signal
abnormality scattered within the cerebral white matter, nonspecific
and measuring up to 3 mm.

4 mm pineal cyst without suspicious masslike or nodular enhancement.

No cortical encephalomalacia is identified.

There is no acute infarct

No chronic intracranial blood products.

No extra-axial fluid collection.

No midline shift.

Vascular: Maintained flow voids within the proximal large arterial
vessels.

Skull and upper cervical spine: No focal suspicious marrow lesion.

Sinuses/Orbits: Visualized orbits show no acute finding. Severe
mucosal thickening within the left maxillary sinus. Mild mucosal
thickening, and small-volume frothy secretions, within the left
ethmoid and sphenoid sinuses.
IMPRESSION: 2-3 mm T2 hyperintense and transiently hypoenhancing lesion within
the left inferior aspect of the pituitary gland, as described and
compatible with the provided history of pituitary microdenoma.

There are a few small nonspecific T2 FLAIR hyperintense remote
insults scattered within the cerebral white matter (measuring up to
3 mm).

4 mm pineal cyst without suspicious masslike or nodular enhancement.

Otherwise unremarkable MRI appearance of the brain.

Paranasal sinus disease, as described.

## 2021-07-03 MED ORDER — GADOBENATE DIMEGLUMINE 529 MG/ML IV SOLN
10.0000 mL | Freq: Once | INTRAVENOUS | Status: AC | PRN
  Administered 2021-07-03: 10 mL via INTRAVENOUS

## 2021-07-06 DIAGNOSIS — R930 Abnormal findings on diagnostic imaging of skull and head, not elsewhere classified: Secondary | ICD-10-CM

## 2021-07-06 DIAGNOSIS — R9082 White matter disease, unspecified: Secondary | ICD-10-CM | POA: Insufficient documentation

## 2021-07-06 HISTORY — DX: Abnormal findings on diagnostic imaging of skull and head, not elsewhere classified: R93.0

## 2021-07-07 NOTE — Progress Notes (Signed)
? ?NEUROLOGY FOLLOW UP OFFICE NOTE ? ?Deborah Lawson ?[id number] ? ?Assessment/Plan:  ? 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  She has evidence of left maxillary sinusitis which may be contributing to facial pain as well, although this is a new finding. ?2  Left sided cervicalgia with radiculopathy ?  ?  ?She has failed pharmacologic therapy and physical therapy for neck/cervical radicular pain.  Will order MRI of cervical spine.  She is changing insurance in a couple of days.  She will reach out to [place] with her new insurance information and then will place order ?Gabapentin 300mg  at bedtime ?Advised to follow up with PCP regarding ongoing sinusitis ?Follow up 6 months. ?  ?Subjective:  ?Deborah Lawson is a 31 year old female who follows up for atypical facial pain.  She is accompanied by Arabic speaking interpreter via phone [phone]) ?  ?UPDATE: ?She saw the dentist and had the cavity treated. Noted some improvement.  .  Referred to PT for neck/TMJ pain.  Still with neck pain radiating down left arm.  In March she was treated for left otitis media.  Still has left ear pain..  Treated for sinusitis and left otitis media.  Still with pain in left ear.  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  She saw endocrinology regarding the pituitary microadenoma with normal workup. ?  ?Current NSAIDS/analgesics:  ibuprofen ?Current triptans:  none ?Current ergotamine:  none ?Current anti-emetic:  none ?Current muscle relaxants:  none ?Current Antihypertensive medications:  none ?Current Antidepressant medications:  none ?Current Anticonvulsant medications:  gabapentin 300mg  QHS ?Current anti-CGRP:   none ?Current Vitamins/Herbal/Supplements:  none ?Current Antihistamines/Decongestants:  none ?Other therapy:  none ?Hormone/birth control:  none ?  ?HISTORY: ?She started having a headache around January  No prior history of headache.  It is a moderate to severe left sided pressure/aching pain involving the left temple, eye, ear and face, including along the jaw and behind TMJ.  Sometimes with associated left sided/occipital numbness and tingling.  It comes and goes during the day the pain lessens and resolves by next day.  They occurs when she feels stressed or depressed, about 3 to 4 days a week.  Sometimes associated neck pain.  No associated nausea, vomiting, photophobia, phonophobia, visual disturbance, autonomic symptoms, numbness or weakness.  She went to the ED where she was diagnosed with a cavity.  She had the tooth pulled but pain persisted.  She was seen in the ED on 04/23/2020 where CT head personally reviewed was unremarkable and she was treated with headache cocktail.  She returned to the ED on 05/31/2020 for left sided TMJ pain where she was prescribed a prednisone taper and instructed to take Motrin as needed.    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  She was referred to endocrinology. ?  ?  ?Past NSAIDS/analgesics:  none ?Past abortive triptans:  none ?Past abortive ergotamine:  none ?Past muscle relaxants:  none ?Past anti-emetic:  none ?Past antihypertensive medications:  nifedipine ?Past antidepressant medications:  none ?Past anticonvulsant medications:  none ?Past anti-CGRP:  none ?Past vitamins/Herbal/Supplements:  none ?Past  antihistamines/decongestants:  none ?Other past therapies:  none ? ?PAST MEDICAL HISTORY: ?Past Medical History:  ?Diagnosis Date  ? Gestational diabetes   ? Gestational hypertension   ? Medical history non-contributory   ? ? ?MEDICATIONS: ?Current Outpatient Medications  on File Prior to Visit  ?Medication Sig Dispense Refill  ? albuterol (VENTOLIN HFA) 108 (90 Base) MCG/ACT inhaler Inhale 1-2 puffs into the lungs every 6 (six) hours as needed for wheezing or shortness of breath. 8 g 0  ? cetirizine (ZYRTEC ALLERGY) 10 MG tablet Take 1 tablet (10 mg total) by mouth daily. 30 tablet 1  ? fluticasone (FLONASE) 50 MCG/ACT nasal spray Place 1 spray into both nostrils daily. 16 g 0  ? gabapentin (NEURONTIN) 300 MG capsule 1 capsule twice daily 60 capsule 5  ? ibuprofen (ADVIL) 800 MG tablet Take 1 tablet (800 mg total) by mouth 3 (three) times daily. 21 tablet 0  ? Iron, Ferrous Sulfate, 325 (65 Fe) MG TABS Take 325 mg by mouth daily. 30 tablet   ? Vitamin D, Ergocalciferol, (DRISDOL) 1.25 MG (50000 UNIT) CAPS capsule Take 1 capsule (50,000 Units total) by mouth every 7 (seven) days for 12 doses. 12 capsule 0  ? [DISCONTINUED] NIFEdipine (PROCARDIA XL) 30 MG 24 hr tablet Take 1 tablet (30 mg total) by mouth daily. 30 tablet 1  ? ?No current facility-administered medications on file prior to visit.  ? ? ?ALLERGIES: ?No Known Allergies ? ?FAMILY HISTORY: ?Family History  ?Problem Relation Age of Onset  ? Hypertension Father   ? ? ?  ?Objective:  ?Blood pressure 128/85, pulse 88, height 5\' 3"  (1.6 m), weight 189 lb (85.7 kg), SpO2 100 %, currently breastfeeding. ?General: No acute distress.  Patient appears well-groomed.   ?Head:  Normocephalic/atraumatic ?Eyes:  Fundi examined but not visualized ?Neck: supple, left sided paraspinal tenderness, full range of motion ?Neurological Exam: alert and oriented to person, place, and time.  Speech fluent and not dysarthric, language intact.  Left V1-V3 numbness.  Otherwise CN II-XII intact. Bulk and tone normal, muscle strength 5/5 throughout.  Sensation to light touch intact.  Finger to nose testing intact.  Gait normal, ? ? ? , DO ? ?CC: Shon Millet Acosta,MD ? ? ? ? ? ? ?

## 2021-07-08 ENCOUNTER — Encounter: Payer: Self-pay | Admitting: Neurology

## 2021-07-08 ENCOUNTER — Ambulatory Visit: Payer: Medicaid Other | Admitting: Neurology

## 2021-07-08 VITALS — BP 128/85 | HR 88 | Ht 63.0 in | Wt 189.0 lb

## 2021-07-08 DIAGNOSIS — J349 Unspecified disorder of nose and nasal sinuses: Secondary | ICD-10-CM | POA: Diagnosis not present

## 2021-07-08 DIAGNOSIS — G501 Atypical facial pain: Secondary | ICD-10-CM

## 2021-07-08 DIAGNOSIS — M542 Cervicalgia: Secondary | ICD-10-CM | POA: Diagnosis not present

## 2021-07-08 MED ORDER — GABAPENTIN 300 MG PO CAPS: 300.0000 mg | ORAL_CAPSULE | Freq: Every day | ORAL | 5 refills | Status: DC

## 2021-07-08 NOTE — Patient Instructions (Addendum)
Continue gabapentin 300mg  at bedtime ?Will check MRI of cervical spine ?Follow up in 6 months. ? ? ????????? ?? ???? ??????? ??????? ????? ?? ???? ????? ??????? ?????? ??????? ??????? ?almutabaeat mae muqadam alrieayat al'awaliat alkhasi bik fima yataealaq biailtihab aljuyub al'anfiat almustamiri ? ? ? ?1. ????? ?? ????? ????????? 300 ??? ?? ??? ????? ?2. ???? ??? ??????? ??????? ?????????? ?????? ?????? ?????? ?3. ???????? ?? 6 ????. ?1. astamara fi tanawul jababnatayn 300 majam fi waqt alnawm ?2. sayatimu fahs altaswir bialranin almighnatisii lileamud alfaqarii aleunuqii ?3. almutabaeat fi 6 'ashhuru. ?

## 2021-07-09 ENCOUNTER — Ambulatory Visit (INDEPENDENT_AMBULATORY_CARE_PROVIDER_SITE_OTHER): Payer: Medicaid Other | Admitting: Internal Medicine

## 2021-07-09 ENCOUNTER — Other Ambulatory Visit: Payer: Self-pay

## 2021-07-09 VITALS — BP 130/80 | HR 103 | Temp 98.2°F | Wt 193.3 lb

## 2021-07-09 DIAGNOSIS — M5412 Radiculopathy, cervical region: Secondary | ICD-10-CM

## 2021-07-09 DIAGNOSIS — J0100 Acute maxillary sinusitis, unspecified: Secondary | ICD-10-CM | POA: Diagnosis not present

## 2021-07-09 DIAGNOSIS — M542 Cervicalgia: Secondary | ICD-10-CM

## 2021-07-09 MED ORDER — PREDNISONE 10 MG (21) PO TBPK: ORAL_TABLET | ORAL | 0 refills | Status: DC

## 2021-07-09 NOTE — Progress Notes (Signed)
? ? ? ?Established Patient Office Visit ? ? ? ? ?This visit occurred during the SARS-CoV-2 public health emergency.  Safety protocols were in place, including screening questions prior to the visit, additional usage of staff PPE, and extensive cleaning of exam room while observing appropriate contact time as indicated for disinfecting solutions.  ? ? ?CC/Reason for Visit: Follow-up acute maxillary sinusitis ? ?HPI: Deborah Lawson is a 31 y.o. female who is coming in today for the above mentioned reasons.  She was treated for left acute otitis media with Augmentin in March.  Ever since then she continues to have ear and left maxillary sinus discomfort.  She had an MRI recently for evaluation of a pituitary cyst which incidentally showed severe mucosal thickening and small volume frothy secretions within the left ethmoid and sphenoid sinuses.  She saw her neurologist yesterday who suggested she follow-up with me.  She has not had any fever or purulent nasal drainage. ? ?Past Medical/Surgical History: ?Past Medical History:  ?Diagnosis Date  ? Gestational diabetes   ? Gestational hypertension   ? Medical history non-contributory   ? ? ?Past Surgical History:  ?Procedure Laterality Date  ? CESAREAN SECTION N/A 10/26/2019  ? Procedure: CESAREAN SECTION;  Surgeon: Reva Bores, MD;  Location: [facility];  Service: Obstetrics;  Laterality: N/A;  ? MANDIBLE SURGERY    ? ? ?Social History: ? reports that she has never smoked. She has never used smokeless tobacco. She reports that she does not drink alcohol and does not use drugs. ? ?Allergies: ?No Known Allergies ? ?Family History:  ?Family History  ?Problem Relation Age of Onset  ? Hypertension Father   ? ? ? ?Current Outpatient Medications:  ?  albuterol (VENTOLIN HFA) 108 (90 Base) MCG/ACT inhaler, Inhale 1-2 puffs into the lungs every 6 (six) hours as needed for wheezing or shortness of breath., Disp: 8 g, Rfl: 0 ?  cetirizine (ZYRTEC ALLERGY) 10 MG tablet, Take 1  tablet (10 mg total) by mouth daily., Disp: 30 tablet, Rfl: 1 ?  fluticasone (FLONASE) 50 MCG/ACT nasal spray, Place 1 spray into both nostrils daily., Disp: 16 g, Rfl: 0 ?  gabapentin (NEURONTIN) 300 MG capsule, Take 1 capsule (300 mg total) by mouth at bedtime. 1 capsule twice daily, Disp: 30 capsule, Rfl: 5 ?  ibuprofen (ADVIL) 800 MG tablet, Take 1 tablet (800 mg total) by mouth 3 (three) times daily., Disp: 21 tablet, Rfl: 0 ?  Iron, Ferrous Sulfate, 325 (65 Fe) MG TABS, Take 325 mg by mouth daily., Disp: 30 tablet, Rfl:  ?  predniSONE (STERAPRED UNI-PAK 21 TAB) 10 MG (21) TBPK tablet, Take as directed, Disp: 21 tablet, Rfl: 0 ?  Vitamin D, Ergocalciferol, (DRISDOL) 1.25 MG (50000 UNIT) CAPS capsule, Take 1 capsule (50,000 Units total) by mouth every 7 (seven) days for 12 doses., Disp: 12 capsule, Rfl: 0 ? ?Review of Systems:  ?Constitutional: Denies fever, chills, diaphoresis, appetite change and fatigue.  ?HEENT: Denies photophobia, eye pain, redness,  congestion, sore throat, rhinorrhea, sneezing, mouth sores, trouble swallowing, neck pain, neck stiffness and tinnitus.   ?Respiratory: Denies SOB, DOE, cough, chest tightness,  and wheezing.   ?Cardiovascular: Denies chest pain, palpitations and leg swelling.  ?Gastrointestinal: Denies nausea, vomiting, abdominal pain, diarrhea, constipation, blood in stool and abdominal distention.  ?Genitourinary: Denies dysuria, urgency, frequency, hematuria, flank pain and difficulty urinating.  ?Endocrine: Denies: hot or cold intolerance, sweats, changes in hair or nails, polyuria, polydipsia. ?Musculoskeletal: Denies myalgias, back pain,  joint swelling, arthralgias and gait problem.  ?Skin: Denies pallor, rash and wound.  ?Neurological: Denies dizziness, seizures, syncope, weakness, light-headedness, numbness and headaches.  ?Hematological: Denies adenopathy. Easy bruising, personal or family bleeding history  ?Psychiatric/Behavioral: Denies suicidal ideation, mood  changes, confusion, nervousness, sleep disturbance and agitation ? ? ? ?Physical Exam: ?Vitals:  ? 07/09/21 1550  ?BP: 130/80  ?Pulse: (!) 103  ?Temp: 98.2 ?F (36.8 ?C)  ?TempSrc: Oral  ?SpO2: 100%  ?Weight: 193 lb 4.8 oz (87.7 kg)  ? ? ?Body mass index is 34.24 kg/m?. ? ? ?Constitutional: NAD, calm, comfortable ?Eyes: PERRL, lids and conjunctivae normal ?ENMT: Mucous membranes are moist.Tympanic membrane is pearly white, no erythema or bulging. ?Psychiatric: Normal judgment and insight. Alert and oriented x 3. Normal mood.  ? ? ?Impression and Plan: ? ?Subacute maxillary sinusitis - Plan: predniSONE (STERAPRED UNI-PAK 21 TAB) 10 MG (21) TBPK tablet ? ?-I do not think antibiotics are necessary, however I do think a course of steroids can help with the significant mucosal thickening and inflammation that was seen on MRI.  Prednisone taper has been requested. ? ?Time spent:21 minutes reviewing chart, interviewing and examining patient and formulating plan of care. ? ? ?Patient Instructions  ?-Nice seeing you today!! ? ?-Start prednisone as directed. ? ? ? ?Chaya Jan, MD ?[facility] ? ? ?

## 2021-07-09 NOTE — Patient Instructions (Signed)
-  Nice seeing you today!! ? ?-Start prednisone as directed. ?

## 2021-07-20 ENCOUNTER — Other Ambulatory Visit: Payer: Self-pay | Admitting: Internal Medicine

## 2021-07-20 DIAGNOSIS — H9209 Otalgia, unspecified ear: Secondary | ICD-10-CM

## 2021-07-20 DIAGNOSIS — R930 Abnormal findings on diagnostic imaging of skull and head, not elsewhere classified: Secondary | ICD-10-CM

## 2021-07-21 ENCOUNTER — Encounter: Payer: Self-pay | Admitting: Internal Medicine

## 2021-07-21 ENCOUNTER — Ambulatory Visit (INDEPENDENT_AMBULATORY_CARE_PROVIDER_SITE_OTHER): Payer: Medicaid Other | Admitting: Internal Medicine

## 2021-07-21 VITALS — BP 120/80 | HR 100 | Temp 98.2°F | Wt 195.4 lb

## 2021-07-21 DIAGNOSIS — N3000 Acute cystitis without hematuria: Secondary | ICD-10-CM

## 2021-07-21 DIAGNOSIS — R300 Dysuria: Secondary | ICD-10-CM | POA: Diagnosis not present

## 2021-07-21 DIAGNOSIS — B3731 Acute candidiasis of vulva and vagina: Secondary | ICD-10-CM | POA: Diagnosis not present

## 2021-07-21 LAB — URINALYSIS, ROUTINE W REFLEX MICROSCOPIC
Bilirubin Urine: NEGATIVE
Ketones, ur: NEGATIVE
Nitrite: NEGATIVE
RBC / HPF: NONE SEEN (ref 0–?)
Specific Gravity, Urine: 1.015 (ref 1.000–1.030)
Total Protein, Urine: NEGATIVE
Urine Glucose: NEGATIVE
Urobilinogen, UA: 0.2 (ref 0.0–1.0)
pH: 7.5 (ref 5.0–8.0)

## 2021-07-21 LAB — POCT URINALYSIS DIPSTICK
Bilirubin, UA: NEGATIVE
Blood, UA: POSITIVE
Glucose, UA: NEGATIVE
Ketones, UA: NEGATIVE
Nitrite, UA: NEGATIVE
Protein, UA: POSITIVE — AB
Spec Grav, UA: 1.02 (ref 1.010–1.025)
Urobilinogen, UA: 0.2 E.U./dL
pH, UA: 7 (ref 5.0–8.0)

## 2021-07-21 MED ORDER — FLUCONAZOLE 150 MG PO TABS: 150.0000 mg | ORAL_TABLET | Freq: Once | ORAL | 0 refills | Status: AC

## 2021-07-21 MED ORDER — SULFAMETHOXAZOLE-TRIMETHOPRIM 800-160 MG PO TABS: 1.0000 | ORAL_TABLET | Freq: Two times a day (BID) | ORAL | 0 refills | Status: DC

## 2021-07-21 NOTE — Progress Notes (Signed)
? ? ? ?Established Patient Office Visit ? ? ? ? ?CC/Reason for Visit: Vaginal itching and dysuria ? ?HPI: Deborah Lawson is a 31 y.o. female who is coming in today for the above mentioned reasons.  For the past 3 days she has had significant vaginal itching with a thick, white discharge.  She has also noticed pain when urinating as well as increased urinary frequency.  No fever, no pain, no nausea or vomiting. ? ?Past Medical/Surgical History: ?Past Medical History:  ?Diagnosis Date  ? Gestational diabetes   ? Gestational hypertension   ? Medical history non-contributory   ? ? ?Past Surgical History:  ?Procedure Laterality Date  ? CESAREAN SECTION N/A 10/26/2019  ? Procedure: CESAREAN SECTION;  Surgeon: Reva Bores, MD;  Location: [facility];  Service: Obstetrics;  Laterality: N/A;  ? MANDIBLE SURGERY    ? ? ?Social History: ? reports that she has never smoked. She has never used smokeless tobacco. She reports that she does not drink alcohol and does not use drugs. ? ?Allergies: ?No Known Allergies ? ?Family History:  ?Family History  ?Problem Relation Age of Onset  ? Hypertension Father   ? ? ? ?Current Outpatient Medications:  ?  albuterol (VENTOLIN HFA) 108 (90 Base) MCG/ACT inhaler, Inhale 1-2 puffs into the lungs every 6 (six) hours as needed for wheezing or shortness of breath., Disp: 8 g, Rfl: 0 ?  cetirizine (ZYRTEC ALLERGY) 10 MG tablet, Take 1 tablet (10 mg total) by mouth daily., Disp: 30 tablet, Rfl: 1 ?  fluconazole (DIFLUCAN) 150 MG tablet, Take 1 tablet (150 mg total) by mouth once for 1 dose., Disp: 1 tablet, Rfl: 0 ?  fluticasone (FLONASE) 50 MCG/ACT nasal spray, Place 1 spray into both nostrils daily., Disp: 16 g, Rfl: 0 ?  gabapentin (NEURONTIN) 300 MG capsule, Take 1 capsule (300 mg total) by mouth at bedtime. 1 capsule twice daily, Disp: 30 capsule, Rfl: 5 ?  ibuprofen (ADVIL) 800 MG tablet, Take 1 tablet (800 mg total) by mouth 3 (three) times daily., Disp: 21 tablet, Rfl: 0 ?  Iron,  Ferrous Sulfate, 325 (65 Fe) MG TABS, Take 325 mg by mouth daily., Disp: 30 tablet, Rfl:  ?  predniSONE (STERAPRED UNI-PAK 21 TAB) 10 MG (21) TBPK tablet, Take as directed, Disp: 21 tablet, Rfl: 0 ?  sulfamethoxazole-trimethoprim (BACTRIM DS) 800-160 MG tablet, Take 1 tablet by mouth 2 (two) times daily for 5 days., Disp: 10 tablet, Rfl: 0 ?  Vitamin D, Ergocalciferol, (DRISDOL) 1.25 MG (50000 UNIT) CAPS capsule, Take 1 capsule (50,000 Units total) by mouth every 7 (seven) days for 12 doses., Disp: 12 capsule, Rfl: 0 ? ?Review of Systems:  ?Constitutional: Denies fever, chills, diaphoresis, appetite change and fatigue.  ?HEENT: Denies photophobia, eye pain, redness, hearing loss, congestion, sore throat, rhinorrhea, sneezing, mouth sores, trouble swallowing, neck pain, neck stiffness and tinnitus.   ?Respiratory: Denies SOB, DOE, cough, chest tightness,  and wheezing.   ?Cardiovascular: Denies chest pain, palpitations and leg swelling.  ?Gastrointestinal: Denies nausea, vomiting, abdominal pain, diarrhea, constipation, blood in stool and abdominal distention.  ?Genitourinary: Denies  hematuria and difficulty urinating.  ?Endocrine: Denies: hot or cold intolerance, sweats, changes in hair or nails, polyuria, polydipsia. ?Musculoskeletal: Denies myalgias, back pain, joint swelling, arthralgias and gait problem.  ?Skin: Denies pallor, rash and wound.  ?Neurological: Denies dizziness, seizures, syncope, weakness, light-headedness, numbness and headaches.  ?Hematological: Denies adenopathy. Easy bruising, personal or family bleeding history  ?Psychiatric/Behavioral: Denies suicidal ideation,  mood changes, confusion, nervousness, sleep disturbance and agitation ? ? ? ?Physical Exam: ?Vitals:  ? 07/21/21 0726  ?BP: 120/80  ?Pulse: 100  ?Temp: 98.2 ?F (36.8 ?C)  ?TempSrc: Oral  ?SpO2: 98%  ?Weight: 195 lb 6.4 oz (88.6 kg)  ? ? ?Body mass index is 34.61 kg/m?. ? ? ?Constitutional: NAD, calm, comfortable ?Eyes: PERRL, lids  and conjunctivae normal ?ENMT: Mucous membranes are moist.  ?Psychiatric: Normal judgment and insight. Alert and oriented x 3. Normal mood.  ? ? ?Impression and Plan: ? ?Acute cystitis without hematuria - Plan: sulfamethoxazole-trimethoprim (BACTRIM DS) 800-160 MG tablet ? ?Dysuria - Plan: POCT urinalysis dipstick, Culture, Urine, Urinalysis ? ?Vaginal yeast infection - Plan: fluconazole (DIFLUCAN) 150 MG tablet ? ?-Urine dipstick is indicative of UTI with leukocytes.  I will go ahead and send in Bactrim DS twice daily for 5 days in addition we will send Diflucan 150 mg x 1 dose for her presumptive vaginal yeast. ? ?Time spent:31 minutes reviewing chart, interviewing and examining patient and formulating plan of care. ? ? ? ? ? ?Chaya Jan, MD ?[facility] ? ? ?

## 2021-07-22 ENCOUNTER — Ambulatory Visit
Admission: RE | Admit: 2021-07-22 | Discharge: 2021-07-22 | Disposition: A | Discharge status: Home / Self Care | Payer: Medicaid Other | Source: Ambulatory Visit | Attending: Neurology | Admitting: Neurology

## 2021-07-22 DIAGNOSIS — M542 Cervicalgia: Secondary | ICD-10-CM

## 2021-07-22 DIAGNOSIS — M5412 Radiculopathy, cervical region: Secondary | ICD-10-CM

## 2021-07-22 LAB — URINE CULTURE
MICRO NUMBER:: 13371088
SPECIMEN QUALITY:: ADEQUATE

## 2021-07-22 IMAGING — MR MR CERVICAL SPINE W/O CM
4 of 5 series · 27 of 48 positions shown · non-contrast
Comparison: Brain MRI [DATE].

CLINICAL DATA: Provided history: Cervical radiculopathy, no red
flags. Cervical radiculopathy. Cervicalgia. Additional history
provided by scanning technologist: Patient reports left-sided neck
and shoulder pain for 4 months.

EXAM:
MRI CERVICAL SPINE WITHOUT CONTRAST
TECHNIQUE: Multiplanar, multisequence MR imaging of the cervical spine was
performed. No intravenous contrast was administered.

[Series 5: T2 · sagittal · 3.0mm · 0.55mm/px · 6 of 15 slices shown (1 of 2)]
[im 1/15]
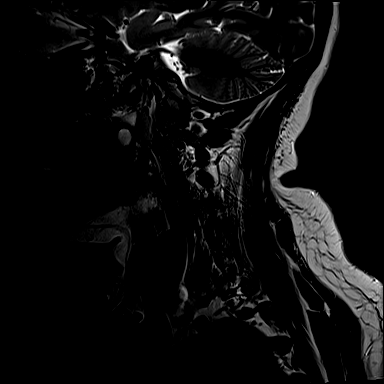
[im 3/15]
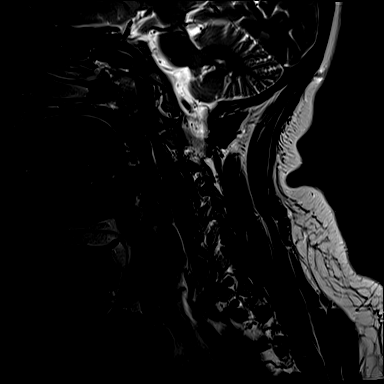
[im 6/15]
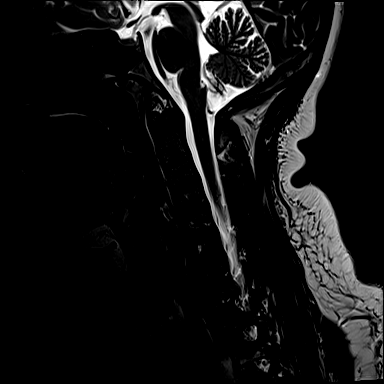
[im 9/15]
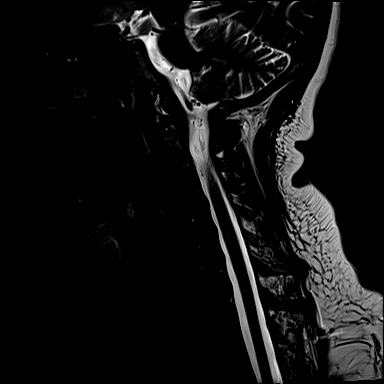
[im 12/15]
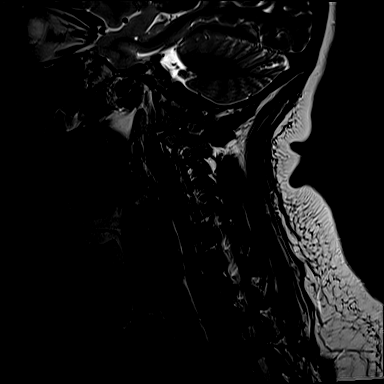
[im 15/15]
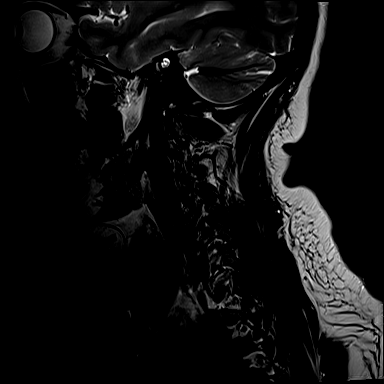

[Series 6: T1 · sagittal · 3.0mm · 0.66mm/px · 6 of 15 slices shown]
[im 1/15]
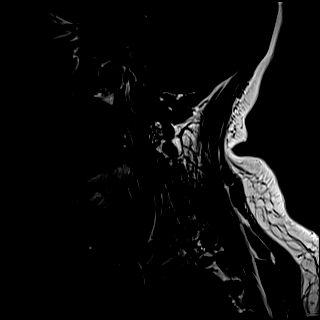
[im 3/15]
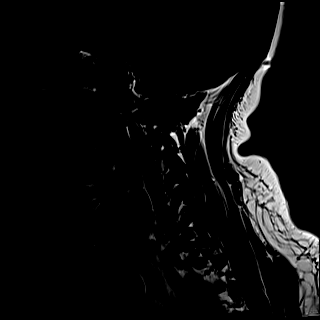
[im 6/15]
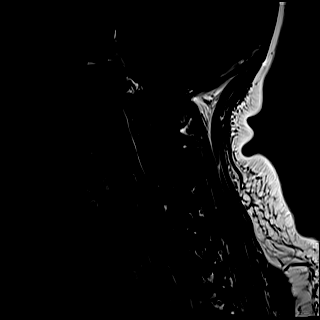
[im 9/15]
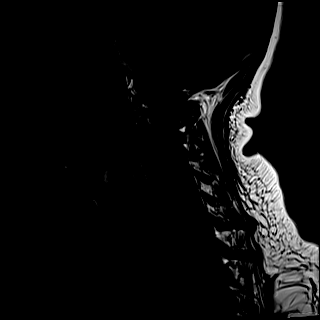
[im 12/15]
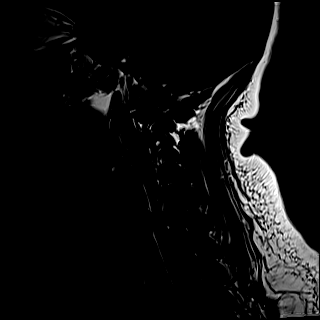
[im 15/15]
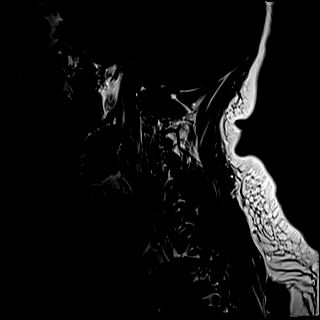

[Series 7: STIR · sagittal · 3.0mm · 0.33mm/px · 6 of 15 slices shown]
[im 1/15]
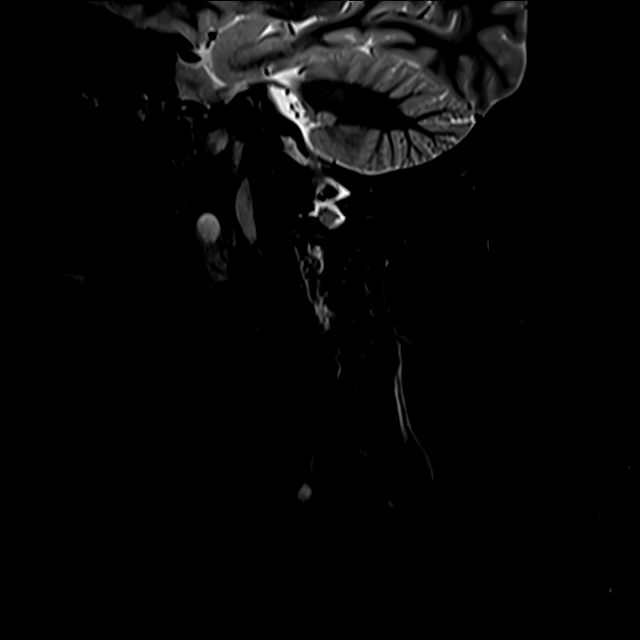
[im 3/15]
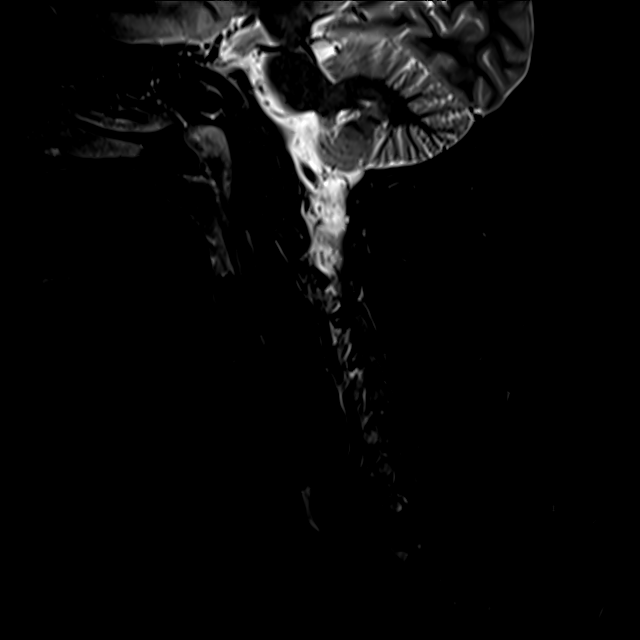
[im 6/15]
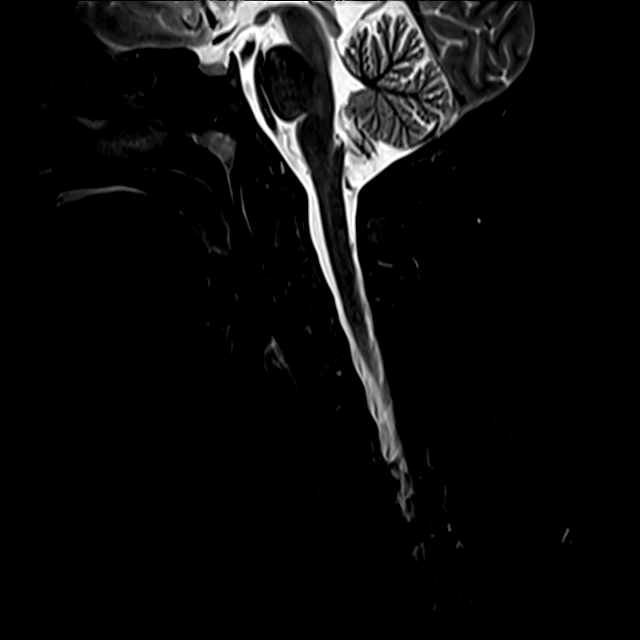
[im 9/15]
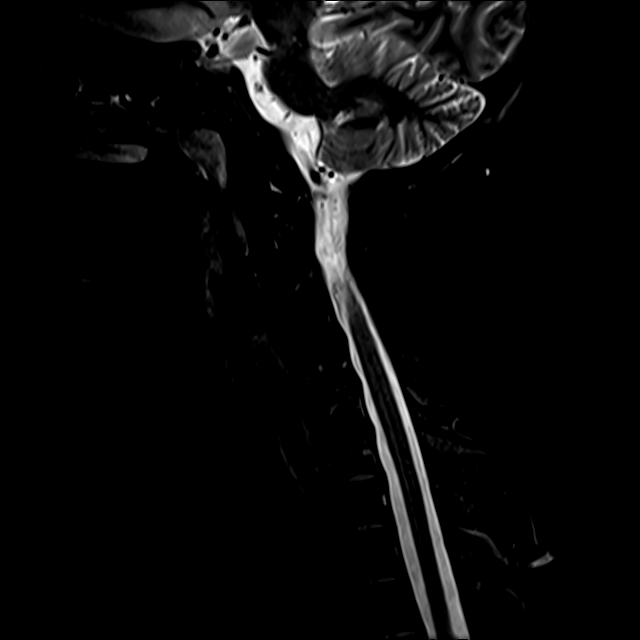
[im 12/15]
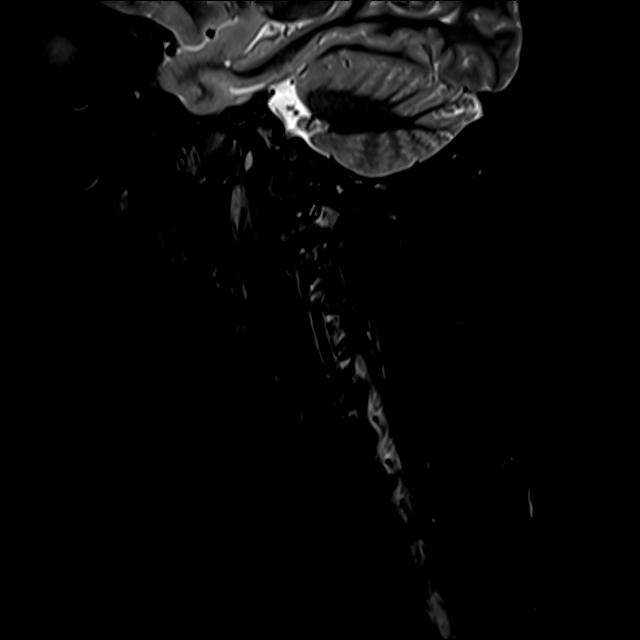
[im 15/15]
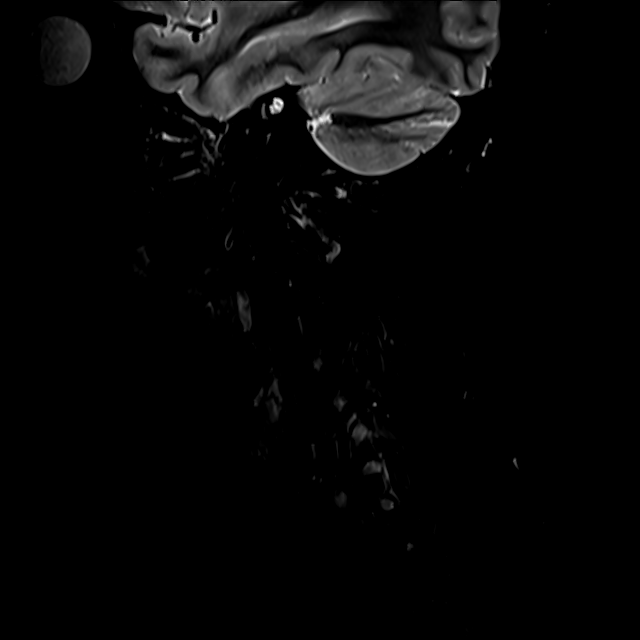

[Series 8: T2 · axial · 3.0mm · 0.50mm/px · z∈[-50,+59]mm · 9 of 35 slices shown (2 of 2)]
[im 1/35]
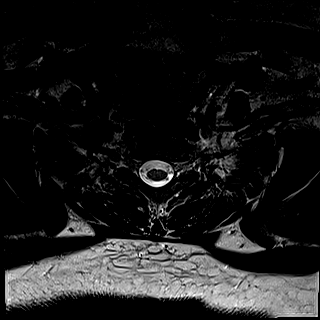
[im 5/35]
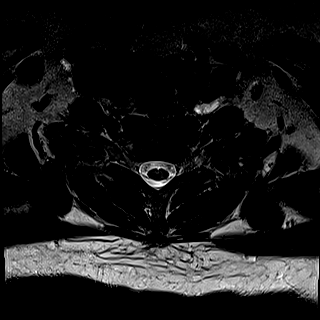
[im 10/35]
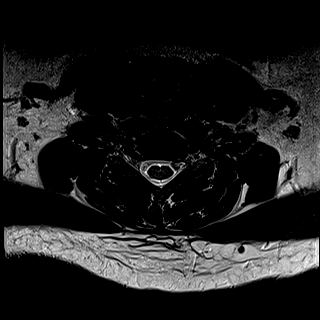
[im 15/35]
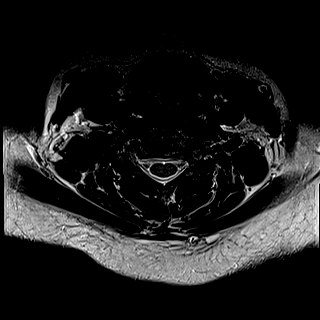
[im 18/35]
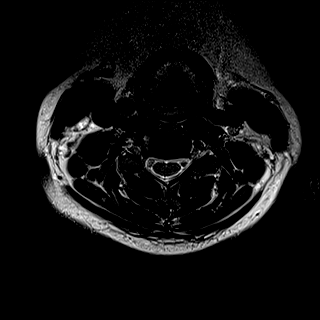
[im 20/35]
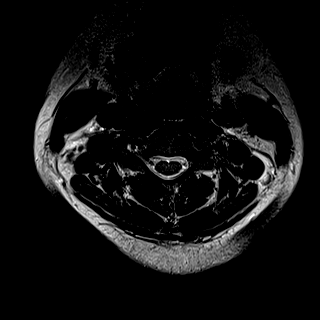
[im 25/35]
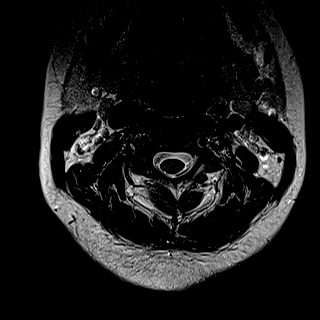
[im 30/35]
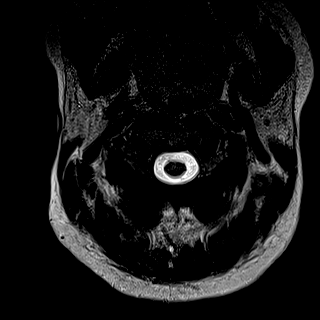
[im 35/35]
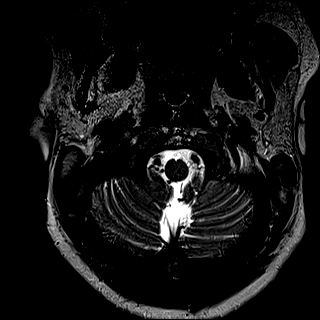

[27 of 48 positions shown; findings below may reference images not displayed]

FINDINGS: Mild intermittent motion degradation.

Alignment: Mild reversal of the expected cervical lordosis. No
significant spondylolisthesis.

Vertebrae: Vertebral body height is maintained. No significant
marrow edema or focal suspicious osseous lesion.

Cord: No signal abnormality identified within the cervical spinal
cord.

Posterior Fossa, vertebral arteries, paraspinal tissues: Please
refer to the recent prior brain MRI of [DATE] for a description
of intracranial findings. Flow voids preserved within the imaged
cervical vertebral arteries. Paraspinal soft tissues unremarkable. 8
mm ovoid well-circumscribed T2 hyperintense focus in the region of
the left palatine tonsil, which may reflect a tonsillar cyst.

Disc levels:

Mild multilevel disc degeneration.

C2-C3: No significant disc herniation or stenosis.

C3-C4: Slight disc bulge. No significant spinal canal or foraminal
stenosis.

C4-C5: Shallow disc bulge. No significant spinal canal or foraminal
stenosis.

C5-C6: Slight disc bulge. No significant spinal canal or foraminal
stenosis.

C6-C7: No significant disc herniation or stenosis.

C7-T1: No significant disc herniation or stenosis.
IMPRESSION: Mild cervical spondylosis, as outlined. No more than mild disc
degeneration. Multilevel shallow disc bulges. No significant spinal
canal or foraminal stenosis.

Nonspecific reversal of the expected cervical lordosis.

No signal abnormality identified within the cervical spinal cord.

8 mm well-circumscribed T2 hyperintense focus in the region of the
left palatine tonsil, which may reflect a tonsillar cyst.

## 2021-07-23 ENCOUNTER — Encounter: Payer: Self-pay | Admitting: Internal Medicine

## 2021-07-23 ENCOUNTER — Telehealth: Payer: Medicaid Other | Admitting: Family Medicine

## 2021-07-23 ENCOUNTER — Telehealth: Payer: Self-pay

## 2021-07-23 ENCOUNTER — Encounter: Payer: Self-pay | Admitting: Neurology

## 2021-07-23 ENCOUNTER — Telehealth: Payer: Self-pay | Admitting: Neurology

## 2021-07-23 DIAGNOSIS — R200 Anesthesia of skin: Secondary | ICD-10-CM

## 2021-07-23 NOTE — Telephone Encounter (Signed)
Patient is returning a call to sheena about results. She said she needs an interpreter to help with language barrier. ? ?

## 2021-07-23 NOTE — Telephone Encounter (Signed)
-----   Message from Pieter Partridge, DO sent at 07/23/2021  7:15 AM EDT ----- ?MRI shows only mild arthritic changes but nothing significant that would cause neck pain and no evidence of pinched nerves that would cause arm pain.  I would recommend ordering a nerve conduction study of the left arm (we can send to [facility]).  Let her know what that test entails (providing small zaps on the arm like a static shock, and then using a very small needle to stick the muscles in order to test the nerves).  There is also evidence of a small cyst on one of her tonsils (an incidental finding).  This is likely nothing but she should bring attention to her PCP in case she feels it requires further workup.  I will send this report and my message to the PCP as well.   ?

## 2021-07-23 NOTE — Progress Notes (Signed)
[place]  ? ?Message was sent to the pt about calling the originally provider of the MRI or her neck. She has read the message per mychart. ? ? ?

## 2021-07-23 NOTE — Telephone Encounter (Signed)
See result notes. 

## 2021-07-23 NOTE — Telephone Encounter (Signed)
Patient advised of her results. EMG ordered. ? ?Results given by me and Interpreter. ?As well as mychart message sent to the patient and translated to Arabic.  ?

## 2021-07-24 ENCOUNTER — Encounter (HOSPITAL_COMMUNITY): Payer: Self-pay

## 2021-07-24 ENCOUNTER — Ambulatory Visit (HOSPITAL_COMMUNITY)
Admission: EM | Admit: 2021-07-24 | Discharge: 2021-07-24 | Disposition: A | Discharge status: Home / Self Care | Payer: Medicaid Other | Attending: Family Medicine | Admitting: Family Medicine

## 2021-07-24 DIAGNOSIS — R1310 Dysphagia, unspecified: Secondary | ICD-10-CM

## 2021-07-24 DIAGNOSIS — R599 Enlarged lymph nodes, unspecified: Secondary | ICD-10-CM | POA: Diagnosis present

## 2021-07-24 LAB — POCT RAPID STREP A, ED / UC: Streptococcus, Group A Screen (Direct): NEGATIVE

## 2021-07-24 NOTE — ED Provider Notes (Signed)
?[facility] ? ? ? ?CSN: [id number] ?Arrival date & time: 07/24/21  1402 ? ? ?  ? ?History   ?Chief Complaint ?Chief Complaint  ?Patient presents with  ? Lymphadenopathy  ? ? ?HPI ?Deborah Lawson is a 31 y.o. female.  ? ?Arabic interpreter used today.  ?Patient is here for pain at the left jaw, neck area x 2 days.  ?She is having a sore throat.   Hard/painful to swallow.  ?She feels hot, no known fever.  ?No medications taken.   ?No runny  no, congestion.   ?Also with some ear pain.  ?No cough today, but had a cough yesterday.  ? ?Of note, she had an MRI of her brain and neck for other reasons.  Found to have a cyst on the left tonsil.   She was referred to ENT for this, but has not yet been contacted. She is worried there is more going on, more worrisome findings.  ? ?Past Medical History:  ?Diagnosis Date  ? Gestational diabetes   ? Gestational hypertension   ? Medical history non-contributory   ? ? ?Patient Active Problem List  ? Diagnosis Date Noted  ? White matter abnormality on MRI of brain 07/06/2021  ? Abnormal computed tomography of paranasal sinus 07/06/2021  ? Vitamin D deficiency 11/11/2020  ? Iron deficiency 11/11/2020  ? Gestational diabetes   ? Gestational hypertension   ? Breech presentation of fetus 10/26/2019  ? Language barrier 10/12/2019  ? Malpresentation before onset of labor 10/12/2019  ? ? ?Past Surgical History:  ?Procedure Laterality Date  ? CESAREAN SECTION N/A 10/26/2019  ? Procedure: CESAREAN SECTION;  Surgeon: Reva Bores, MD;  Location: [facility];  Service: Obstetrics;  Laterality: N/A;  ? MANDIBLE SURGERY    ? ? ?OB History   ? ? Gravida  ?3  ? Para  ?3  ? Term  ?3  ? Preterm  ?0  ? AB  ?0  ? Living  ?3  ?  ? ? SAB  ?0  ? IAB  ?0  ? Ectopic  ?0  ? Multiple  ?0  ? Live Births  ?3  ?   ?  ?  ? ? ? ?Home Medications   ? ?Prior to Admission medications   ?Medication Sig Start Date End Date Taking? Authorizing Provider  ?albuterol (VENTOLIN HFA) 108 (90 Base) MCG/ACT inhaler  Inhale 1-2 puffs into the lungs every 6 (six) hours as needed for wheezing or shortness of breath. 06/03/21   Philip Aspen, Limmie Patricia, MD  ?cetirizine (ZYRTEC ALLERGY) 10 MG tablet Take 1 tablet (10 mg total) by mouth daily. 05/18/21   Raspet, Noberto Retort, PA-C  ?fluticasone (FLONASE) 50 MCG/ACT nasal spray Place 1 spray into both nostrils daily. 05/18/21   Raspet, Noberto Retort, PA-C  ?gabapentin (NEURONTIN) 300 MG capsule Take 1 capsule (300 mg total) by mouth at bedtime. 1 capsule twice daily 07/08/21   Drema Dallas, DO  ?ibuprofen (ADVIL) 800 MG tablet Take 1 tablet (800 mg total) by mouth 3 (three) times daily. 01/09/21   Rushie Chestnut, PA-C  ?Iron, Ferrous Sulfate, 325 (65 Fe) MG TABS Take 325 mg by mouth daily. 11/11/20   Philip Aspen, Limmie Patricia, MD  ?Vitamin D, Ergocalciferol, (DRISDOL) 1.25 MG (50000 UNIT) CAPS capsule Take 1 capsule (50,000 Units total) by mouth every 7 (seven) days for 12 doses. 05/13/21 07/30/21  Philip Aspen, Limmie Patricia, MD  ?NIFEdipine (PROCARDIA XL) 30 MG 24 hr tablet  Take 1 tablet (30 mg total) by mouth daily. 11/05/19 02/18/20  Donette Larry, CNM  ? ? ?Family History ?Family History  ?Problem Relation Age of Onset  ? Hypertension Father   ? ? ?Social History ?Social History  ? ?Tobacco Use  ? Smoking status: Never  ? Smokeless tobacco: Never  ?Vaping Use  ? Vaping Use: Never used  ?Substance Use Topics  ? Alcohol use: Never  ? Drug use: Never  ? ? ? ?Allergies   ?Patient has no known allergies. ? ? ?Review of Systems ?Review of Systems  ?Constitutional: Negative.   ?HENT:  Positive for congestion, rhinorrhea and sore throat.   ?Respiratory: Negative.    ?Cardiovascular: Negative.   ?Gastrointestinal: Negative.   ?Genitourinary: Negative.   ?Musculoskeletal: Negative.   ? ? ?Physical Exam ?Triage Vital Signs ?ED Triage Vitals  ?Enc Vitals Group  ?   BP 07/24/21 1452 119/82  ?   Pulse Rate 07/24/21 1452 (!) 106  ?   Resp 07/24/21 1452 18  ?   Temp 07/24/21 1452 98.6 ?F (37 ?C)  ?   Temp  Source 07/24/21 1452 Oral  ?   SpO2 07/24/21 1452 97 %  ?   Weight --   ?   Height --   ?   Head Circumference --   ?   Peak Flow --   ?   Pain Score 07/24/21 1453 9  ?   Pain Loc --   ?   Pain Edu? --   ?   Excl. in GC? --   ? ?No data found. ? ?Updated Vital Signs ?BP 119/82 (BP Location: Left Arm)   Pulse (!) 106   Temp 98.6 ?F (37 ?C) (Oral)   Resp 18   LMP 07/06/2021   SpO2 97%   Breastfeeding No  ? ?Visual Acuity ?Right Eye Distance:   ?Left Eye Distance:   ?Bilateral Distance:   ? ?Right Eye Near:   ?Left Eye Near:    ?Bilateral Near:    ? ?Physical Exam ?Constitutional:   ?   Appearance: Normal appearance.  ?HENT:  ?   Head: Normocephalic and atraumatic.  ?   Mouth/Throat:  ?   Mouth: Mucous membranes are moist.  ?   Pharynx: Posterior oropharyngeal erythema present. No oropharyngeal exudate.  ?   Tonsils: No tonsillar exudate or tonsillar abscesses.  ?Neck:  ?   Comments: Under the left jaw ?Cardiovascular:  ?   Rate and Rhythm: Normal rate.  ?Pulmonary:  ?   Effort: Pulmonary effort is normal.  ?Musculoskeletal:  ?   Cervical back: Normal range of motion and neck supple. Tenderness present.  ?Skin: ?   General: Skin is warm.  ?Neurological:  ?   General: No focal deficit present.  ?   Mental Status: She is alert.  ? ? ? ?UC Treatments / Results  ?Labs ?(all labs ordered are listed, but only abnormal results are displayed) ?Labs Reviewed  ?CULTURE, GROUP A STREP [facility])  ?POCT RAPID STREP A, ED / UC  ? ? ?EKG ? ? ?Radiology ?MR CERVICAL SPINE WO CONTRAST ? ?Result Date: 07/22/2021 ?CLINICAL DATA:  Provided history: Cervical radiculopathy, no red flags. Cervical radiculopathy. Cervicalgia. Additional history provided by scanning technologist: Patient reports left-sided neck and shoulder pain for 4 months. EXAM: MRI CERVICAL SPINE WITHOUT CONTRAST TECHNIQUE: Multiplanar, multisequence MR imaging of the cervical spine was performed. No intravenous contrast was administered. COMPARISON:  Brain MRI  07/03/2021. FINDINGS: Mild intermittent motion degradation. Alignment:  Mild reversal of the expected cervical lordosis. No significant spondylolisthesis. Vertebrae: Vertebral body height is maintained. No significant marrow edema or focal suspicious osseous lesion. Cord: No signal abnormality identified within the cervical spinal cord. Posterior Fossa, vertebral arteries, paraspinal tissues: Please refer to the recent prior brain MRI of 07/03/2021 for a description of intracranial findings. Flow voids preserved within the imaged cervical vertebral arteries. Paraspinal soft tissues unremarkable. 8 mm ovoid well-circumscribed T2 hyperintense focus in the region of the left palatine tonsil, which may reflect a tonsillar cyst. Disc levels: Mild multilevel disc degeneration. C2-C3: No significant disc herniation or stenosis. C3-C4: Slight disc bulge. No significant spinal canal or foraminal stenosis. C4-C5: Shallow disc bulge. No significant spinal canal or foraminal stenosis. C5-C6: Slight disc bulge. No significant spinal canal or foraminal stenosis. C6-C7: No significant disc herniation or stenosis. C7-T1: No significant disc herniation or stenosis. IMPRESSION: Mild cervical spondylosis, as outlined. No more than mild disc degeneration. Multilevel shallow disc bulges. No significant spinal canal or foraminal stenosis. Nonspecific reversal of the expected cervical lordosis. No signal abnormality identified within the cervical spinal cord. 8 mm well-circumscribed T2 hyperintense focus in the region of the left palatine tonsil, which may reflect a tonsillar cyst. Electronically Signed   By: Jackey Loge D.O.   On: 07/22/2021 17:30   ? ?Procedures ?Procedures (including critical care time) ? ?Medications Ordered in UC ?Medications - No data to display ? ?Initial Impression / Assessment and Plan / UC Course  ?I have reviewed the triage vital signs and the nursing notes. ? ?Pertinent labs & imaging results that were  available during my care of the patient were reviewed by me and considered in my medical decision making (see chart for details). ? ?  ?Final Clinical Impressions(s) / UC Diagnoses  ? ?Final diagnoses:  ?Enlarged lymph node

## 2021-07-24 NOTE — ED Triage Notes (Signed)
Pt c/o swollen lymph node to lt side of neck x2 days. Denies taking meds for pain. ?

## 2021-07-24 NOTE — Discharge Instructions (Signed)
You were seen today for tenderness to the left neck/jaw line.  ?Your exam overall looks okay.  Your strep test was negative, and will be sent for culture.  ?For your sore throat I recommend tylenol or motrin for pain, as well as salt water gargles.   I do not think the cyst on the tonsil is related to this, but you should still follow up with ENT as already ordered by your PCP.  ?

## 2021-07-27 LAB — CULTURE, GROUP A STREP (THRC)

## 2021-07-28 ENCOUNTER — Encounter: Payer: Self-pay | Admitting: Internal Medicine

## 2021-07-28 ENCOUNTER — Ambulatory Visit (INDEPENDENT_AMBULATORY_CARE_PROVIDER_SITE_OTHER): Payer: Medicaid Other | Admitting: Internal Medicine

## 2021-07-28 VITALS — BP 118/80 | HR 100 | Temp 98.2°F | Wt 191.3 lb

## 2021-07-28 DIAGNOSIS — H9209 Otalgia, unspecified ear: Secondary | ICD-10-CM

## 2021-07-28 DIAGNOSIS — J0100 Acute maxillary sinusitis, unspecified: Secondary | ICD-10-CM

## 2021-07-28 NOTE — Progress Notes (Signed)
Visit created in error. No charge. ? ?Domingo Mend, MD ?[facility] ? ?

## 2021-07-29 ENCOUNTER — Telehealth: Payer: Self-pay | Admitting: *Deleted

## 2021-07-29 NOTE — Telephone Encounter (Signed)
Patient is aware of referral to ENT via interpreter Prudence Davidson and MyChart message ?

## 2021-08-05 ENCOUNTER — Other Ambulatory Visit: Payer: Self-pay | Admitting: Neurology

## 2021-08-11 ENCOUNTER — Ambulatory Visit: Payer: Medicaid Other | Admitting: Internal Medicine

## 2021-08-14 ENCOUNTER — Other Ambulatory Visit: Payer: Self-pay | Admitting: Internal Medicine

## 2021-08-14 DIAGNOSIS — E559 Vitamin D deficiency, unspecified: Secondary | ICD-10-CM

## 2021-09-16 ENCOUNTER — Other Ambulatory Visit: Payer: Self-pay | Admitting: Otolaryngology

## 2021-09-16 DIAGNOSIS — J329 Chronic sinusitis, unspecified: Secondary | ICD-10-CM

## 2021-09-21 ENCOUNTER — Ambulatory Visit
Admission: RE | Admit: 2021-09-21 | Discharge: 2021-09-21 | Disposition: A | Discharge status: Home / Self Care | Payer: Medicaid Other | Source: Ambulatory Visit | Attending: Otolaryngology | Admitting: Otolaryngology

## 2021-09-21 DIAGNOSIS — J329 Chronic sinusitis, unspecified: Secondary | ICD-10-CM

## 2021-09-22 ENCOUNTER — Ambulatory Visit (HOSPITAL_COMMUNITY)
Admission: EM | Admit: 2021-09-22 | Discharge: 2021-09-22 | Disposition: A | Discharge status: Home / Self Care | Payer: Medicaid Other | Attending: Physician Assistant | Admitting: Physician Assistant

## 2021-09-22 ENCOUNTER — Encounter (HOSPITAL_COMMUNITY): Payer: Self-pay | Admitting: Emergency Medicine

## 2021-09-22 DIAGNOSIS — R8290 Unspecified abnormal findings in urine: Secondary | ICD-10-CM

## 2021-09-22 DIAGNOSIS — R300 Dysuria: Secondary | ICD-10-CM | POA: Insufficient documentation

## 2021-09-22 DIAGNOSIS — N390 Urinary tract infection, site not specified: Secondary | ICD-10-CM

## 2021-09-22 DIAGNOSIS — M545 Low back pain, unspecified: Secondary | ICD-10-CM

## 2021-09-22 LAB — POCT URINALYSIS DIPSTICK, ED / UC
Bilirubin Urine: NEGATIVE
Glucose, UA: NEGATIVE mg/dL
Hgb urine dipstick: NEGATIVE
Ketones, ur: NEGATIVE mg/dL
Nitrite: NEGATIVE
Protein, ur: NEGATIVE mg/dL
Specific Gravity, Urine: 1.02 (ref 1.005–1.030)
Urobilinogen, UA: 0.2 mg/dL (ref 0.0–1.0)
pH: 7 (ref 5.0–8.0)

## 2021-09-22 LAB — POC URINE PREG, ED: Preg Test, Ur: NEGATIVE

## 2021-09-22 MED ORDER — CEPHALEXIN 500 MG PO CAPS: 500.0000 mg | ORAL_CAPSULE | Freq: Three times a day (TID) | ORAL | 0 refills | Status: DC

## 2021-09-22 NOTE — Discharge Instructions (Signed)
Urine pregnancy was negative.  UA did show some white blood cells which could be related to a urinary tract infection.  Start Keflex 500 mg 3 times daily for 5 days.  We will contact you if we need to change her treatment based on your other results.  Make sure you drink plenty fluid.  Use Tylenol/ibuprofen for pain.  If your symptoms or not improving return for reevaluation.  If anything worsens you need to be seen immediately.

## 2021-09-22 NOTE — ED Triage Notes (Signed)
Pt is present today with c/o lower back pain and dizziness. Pt sx started x2 days ago

## 2021-09-22 NOTE — ED Provider Notes (Signed)
[place]    CSN: [id number] Arrival date & time: 09/22/21  1807      History   Chief Complaint Chief Complaint  Patient presents with   Back Pain   Dizziness    HPI Deborah Lawson is a 31 y.o. female.   Patient presents today with a 2-day history of lower back pain.  Patient is Arabic speaking and video interpreter was utilized during visit.  Reports this is in bilateral lower regions with increased pain on the left that radiates into left leg.  Pain is rated 9 on a 0-10 pain scale, described as sharp, no aggravating or limiting factors identified.  She denies any known injury or increase in activity prior to symptom onset.  She has not tried any over-the-counter medication.  She does report some dysuria but denies additional symptoms including frequency, urgency, hematuria.  She does not believe that she is pregnant but is open to testing.  She is not breast-feeding.  Denies any fever, nausea, vomiting, bowel/bladder incontinence, lower extremity weakness.  Denies previous spinal surgery.  Denies history of malignancy.  She is having difficulty with daily activities as result of symptoms.    Past Medical History:  Diagnosis Date   Gestational diabetes    Gestational hypertension    Medical history non-contributory     Patient Active Problem List   Diagnosis Date Noted   White matter abnormality on MRI of brain 07/06/2021   Abnormal computed tomography of paranasal sinus 07/06/2021   Vitamin D deficiency 11/11/2020   Iron deficiency 11/11/2020   Gestational diabetes    Gestational hypertension    Breech presentation of fetus 10/26/2019   Language barrier 10/12/2019   Malpresentation before onset of labor 10/12/2019    Past Surgical History:  Procedure Laterality Date   CESAREAN SECTION N/A 10/26/2019   Procedure: CESAREAN SECTION;  Surgeon: Donnamae Jude, MD;  Location: [facility] ORS;  Service: Obstetrics;  Laterality: N/A;   MANDIBLE SURGERY      OB  History     Gravida  3   Para  3   Term  3   Preterm  0   AB  0   Living  3      SAB  0   IAB  0   Ectopic  0   Multiple  0   Live Births  3            Home Medications    Prior to Admission medications   Medication Sig Start Date End Date Taking? Authorizing Provider  cephALEXin (KEFLEX) 500 MG capsule Take 1 capsule (500 mg total) by mouth 3 (three) times daily. 09/22/21  Yes Dayquan Buys K, PA-C  albuterol (VENTOLIN HFA) 108 (90 Base) MCG/ACT inhaler Inhale 1-2 puffs into the lungs every 6 (six) hours as needed for wheezing or shortness of breath. 06/03/21   Isaac Bliss, Rayford Halsted, MD  cetirizine (ZYRTEC ALLERGY) 10 MG tablet Take 1 tablet (10 mg total) by mouth daily. 05/18/21   Latresa Gasser K, PA-C  fluticasone (FLONASE) 50 MCG/ACT nasal spray Place 1 spray into both nostrils daily. 05/18/21   Ora Mcnatt, Derry Skill, PA-C  gabapentin (NEURONTIN) 300 MG capsule Take 1 capsule (300 mg total) by mouth at bedtime. 1 capsule twice daily 07/08/21   Pieter Partridge, DO  ibuprofen (ADVIL) 800 MG tablet Take 1 tablet (800 mg total) by mouth 3 (three) times daily. 01/09/21   Hughie Closs, PA-C  Iron, Ferrous Sulfate,  325 (65 Fe) MG TABS Take 325 mg by mouth daily. 11/11/20   Philip Aspen, Limmie Patricia, MD  NIFEdipine (PROCARDIA XL) 30 MG 24 hr tablet Take 1 tablet (30 mg total) by mouth daily. 11/05/19 02/18/20  Donette Larry, CNM    Family History Family History  Problem Relation Age of Onset   Hypertension Father     Social History Social History   Tobacco Use   Smoking status: Never   Smokeless tobacco: Never  Vaping Use   Vaping Use: Never used  Substance Use Topics   Alcohol use: Never   Drug use: Never     Allergies   Patient has no known allergies.   Review of Systems Review of Systems  Constitutional:  Positive for activity change. Negative for appetite change, fatigue and fever.  Respiratory:  Negative for cough and shortness of breath.    Cardiovascular:  Negative for chest pain.  Gastrointestinal:  Negative for abdominal pain, diarrhea, nausea and vomiting.  Genitourinary:  Positive for dysuria. Negative for frequency, pelvic pain, urgency, vaginal bleeding, vaginal discharge and vaginal pain.  Musculoskeletal:  Positive for back pain. Negative for arthralgias and myalgias.  Neurological:  Positive for dizziness. Negative for light-headedness and headaches.     Physical Exam Triage Vital Signs ED Triage Vitals  Enc Vitals Group     BP 09/22/21 1840 127/68     Pulse Rate 09/22/21 1840 95     Resp 09/22/21 1840 18     Temp 09/22/21 1840 98.9 F (37.2 C)     Temp src --      SpO2 09/22/21 1840 99 %     Weight --      Height --      Head Circumference --      Peak Flow --      Pain Score 09/22/21 1839 9     Pain Loc --      Pain Edu? --      Excl. in GC? --    No data found.  Updated Vital Signs BP 127/68   Pulse 95   Temp 98.9 F (37.2 C)   Resp 18   SpO2 99%   Breastfeeding No   Visual Acuity Right Eye Distance:   Left Eye Distance:   Bilateral Distance:    Right Eye Near:   Left Eye Near:    Bilateral Near:     Physical Exam Vitals reviewed.  Constitutional:      General: She is awake. She is not in acute distress.    Appearance: Normal appearance. She is well-developed. She is not ill-appearing.     Comments: Very pleasant female appears stated age in no acute distress sitting comfortably in exam room  HENT:     Head: Normocephalic and atraumatic.     Mouth/Throat:     Pharynx: Uvula midline. No oropharyngeal exudate or posterior oropharyngeal erythema.  Cardiovascular:     Rate and Rhythm: Normal rate and regular rhythm.     Heart sounds: Normal heart sounds, S1 normal and S2 normal. No murmur heard. Pulmonary:     Effort: Pulmonary effort is normal.     Breath sounds: Normal breath sounds. No wheezing, rhonchi or rales.     Comments: Clear to auscultation bilaterally Abdominal:      General: Bowel sounds are normal.     Palpations: Abdomen is soft.     Tenderness: There is no abdominal tenderness. There is no right CVA tenderness, left CVA tenderness, guarding  or rebound.  Musculoskeletal:     Cervical back: No tenderness or bony tenderness.     Thoracic back: No tenderness or bony tenderness.     Lumbar back: Tenderness present. No spasms or bony tenderness. Negative right straight leg raise test and negative left straight leg raise test.     Comments: Back: No pain percussion of vertebrae.  No deformity or step-off noted.  Tenderness palpation of bilateral lumbar paraspinal muscles but worse on left.  Strength 5/5 bilateral lower extremities.  Psychiatric:        Behavior: Behavior is cooperative.      UC Treatments / Results  Labs (all labs ordered are listed, but only abnormal results are displayed) Labs Reviewed  POCT URINALYSIS DIPSTICK, ED / UC - Abnormal; Notable for the following components:      Result Value   Leukocytes,Ua MODERATE (*)    All other components within normal limits  URINE CULTURE  POC URINE PREG, ED  CERVICOVAGINAL ANCILLARY ONLY    EKG   Radiology CT MAXILLOFACIAL WO CONTRAST  Result Date: 09/22/2021 CLINICAL DATA:  Chronic sinusitis EXAM: CT MAXILLOFACIAL WITHOUT CONTRAST TECHNIQUE: Multidetector CT images of the paranasal sinuses were obtained using the standard protocol without intravenous contrast. RADIATION DOSE REDUCTION: This exam was performed according to the departmental dose-optimization program which includes automated exposure control, adjustment of the mA and/or kV according to patient size and/or use of iterative reconstruction technique. COMPARISON:  MRI 07/03/2021 FINDINGS: Paranasal sinuses: Frontal: Normally aerated. Patent frontal sinus drainage pathways. 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 Ethmoid: Normally aerated. Maxillary: Normally aerated. Resolution of the left maxillary  sinus mucosal disease seen in April of this year by MRI. Sphenoid: Normally aerated. Patent sphenoethmoidal recesses. Right ostiomeatal unit: Patent. Left ostiomeatal unit: Patent. Nasal passages: Patent. Nasal septum bows 2 mm towards the left. Concha bullosa of the middle turbinates. Anatomy: No pneumatization superior to anterior ethmoid notches. Symmetric and intact olfactory grooves and fovea ethmoidalis, Keros II (4-26mm). Sellar sphenoid pneumatization pattern. No dehiscence of carotid or optic canals. No onodi cell. Other: None IMPRESSION: Resolution of the previously seen left maxillary sinus mucosal inflammation. Presently, the sinuses are clear. 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 Electronically Signed   By: Nelson Chimes M.D.   On: 09/22/2021 09:40    Procedures Procedures (including critical care time)  Medications Ordered in UC Medications - No data to display  Initial Impression / Assessment and Plan / UC Course  I have reviewed the triage vital signs and the nursing notes.  Pertinent labs & imaging results that were available during my care of the patient were reviewed by me and considered in my medical decision making (see chart for details).     No indication for plain films as patient had no significant bony tenderness and denies any recent trauma.  Urine pregnancy test was negative.  She did report some dysuria and so UA was obtained that showed leukocyte Estrace.  Upon further questioning patient had a difficult time differentiating if this is discomfort related to urination or in the vagina.  Will cover for UTI with Keflex 500 mg 3 times daily.  Urine culture was obtained-results pending.  We will make additional recommendations based on laboratory findings.  STI swab was collected today as well.  Discussed that she should use heat and Tylenol/ibuprofen for pain.  This should improve once the underlying suspected UTI is addressed.   Recommend that she rest and drink  plenty of fluid.  Discussed that if she has any worsening symptoms including increased pain, lower extremity weakness, saddle anesthesia, bowel/bladder incontinence, fever, hematuria she is to be seen immediately.  Strict return precautions given.  Final Clinical Impressions(s) / UC Diagnoses   Final diagnoses:  Acute left-sided low back pain without sciatica  Lower urinary tract infectious disease  Dysuria  Abnormal urinalysis     Discharge Instructions      Urine pregnancy was negative.  UA did show some white blood cells which could be related to a urinary tract infection.  Start Keflex 500 mg 3 times daily for 5 days.  We will contact you if we need to change her treatment based on your other results.  Make sure you drink plenty fluid.  Use Tylenol/ibuprofen for pain.  If your symptoms or not improving return for reevaluation.  If anything worsens you need to be seen immediately.     ED Prescriptions     Medication Sig Dispense Auth. Provider   cephALEXin (KEFLEX) 500 MG capsule Take 1 capsule (500 mg total) by mouth 3 (three) times daily. 15 capsule Xiadani Damman K, PA-C      PDMP not reviewed this encounter.   Jeani Hawking, PA-C 09/22/21 1917

## 2021-09-23 LAB — CERVICOVAGINAL ANCILLARY ONLY
Bacterial Vaginitis (gardnerella): POSITIVE — AB
Candida Glabrata: NEGATIVE
Candida Vaginitis: NEGATIVE
Chlamydia: NEGATIVE
Comment: NEGATIVE
Comment: NEGATIVE
Comment: NEGATIVE
Comment: NEGATIVE
Comment: NEGATIVE
Comment: NORMAL
Neisseria Gonorrhea: NEGATIVE
Trichomonas: NEGATIVE

## 2021-09-23 LAB — URINE CULTURE: Culture: <10000 — AB

## 2021-09-24 ENCOUNTER — Telehealth (HOSPITAL_COMMUNITY): Payer: Self-pay | Admitting: Emergency Medicine

## 2021-09-24 ENCOUNTER — Other Ambulatory Visit (INDEPENDENT_AMBULATORY_CARE_PROVIDER_SITE_OTHER): Payer: Medicaid Other

## 2021-09-24 DIAGNOSIS — E559 Vitamin D deficiency, unspecified: Secondary | ICD-10-CM | POA: Diagnosis not present

## 2021-09-24 LAB — VITAMIN D 25 HYDROXY (VIT D DEFICIENCY, FRACTURES): VITD: 18.05 ng/mL — ABNORMAL LOW (ref 30.00–100.00)

## 2021-09-24 MED ORDER — METRONIDAZOLE 500 MG PO TABS: 500.0000 mg | ORAL_TABLET | Freq: Two times a day (BID) | ORAL | 0 refills | Status: DC

## 2021-09-29 ENCOUNTER — Encounter: Payer: Self-pay | Admitting: Internal Medicine

## 2021-10-01 ENCOUNTER — Other Ambulatory Visit: Payer: Self-pay | Admitting: Internal Medicine

## 2021-10-01 ENCOUNTER — Other Ambulatory Visit: Payer: Self-pay | Admitting: *Deleted

## 2021-10-01 DIAGNOSIS — E559 Vitamin D deficiency, unspecified: Secondary | ICD-10-CM

## 2021-10-01 MED ORDER — VITAMIN D (ERGOCALCIFEROL) 1.25 MG (50000 UNIT) PO CAPS: 50000.0000 [IU] | ORAL_CAPSULE | ORAL | 0 refills | Status: AC

## 2021-10-02 ENCOUNTER — Encounter: Payer: Self-pay | Admitting: Internal Medicine

## 2021-10-02 ENCOUNTER — Ambulatory Visit (HOSPITAL_COMMUNITY)
Admission: EM | Admit: 2021-10-02 | Discharge: 2021-10-02 | Disposition: A | Discharge status: Home / Self Care | Payer: Medicaid Other | Attending: Internal Medicine | Admitting: Internal Medicine

## 2021-10-02 ENCOUNTER — Encounter (HOSPITAL_COMMUNITY): Payer: Self-pay

## 2021-10-02 DIAGNOSIS — R109 Unspecified abdominal pain: Secondary | ICD-10-CM | POA: Diagnosis not present

## 2021-10-02 DIAGNOSIS — Z3201 Encounter for pregnancy test, result positive: Secondary | ICD-10-CM | POA: Insufficient documentation

## 2021-10-02 DIAGNOSIS — R110 Nausea: Secondary | ICD-10-CM | POA: Diagnosis not present

## 2021-10-02 LAB — POCT URINALYSIS DIPSTICK, ED / UC
Bilirubin Urine: NEGATIVE
Glucose, UA: NEGATIVE mg/dL
Ketones, ur: NEGATIVE mg/dL
Nitrite: NEGATIVE
Protein, ur: NEGATIVE mg/dL
Specific Gravity, Urine: 1.02 (ref 1.005–1.030)
Urobilinogen, UA: 0.2 mg/dL (ref 0.0–1.0)
pH: 8.5 — ABNORMAL HIGH (ref 5.0–8.0)

## 2021-10-02 LAB — POC URINE PREG, ED: Preg Test, Ur: POSITIVE — AB

## 2021-10-02 MED ORDER — PRENATAL COMPLETE 14-0.4 MG PO TABS: ORAL_TABLET | ORAL | 1 refills | Status: DC

## 2021-10-02 NOTE — Discharge Instructions (Addendum)
Your pregnancy test is positive.  Start taking the prescribed prenatal vitamin daily.  Refer to the list of safe medications in pregnancy you were given at urgent care today and do not take any other medications over-the-counter other than what is on this list.  You may only take Tylenol for pain and discomfort/fever.  Do not take ibuprofen in pregnancy.   Drink plenty of water (at least 8 cups/day) to stay well-hydrated.   Call one of the following clinics to schedule an appointment for your pregnancy care.  [facility]            [address], [place], [place] [address] [phone]   [facility]                                                            [address], [address], [place], [place], [address] [phone]   [facility]                                   [address], [address], [place], [place], [address] [phone]   [facility] [place] [address], [address], [place], [place], [address] [phone]   [facility]                                [address], [place], [place], [address] [phone]   [facility]                                   [address], [place], [place], [address] [phone]   [facility] [address], [address], [place], [place], [address]        If you develop any severe abdominal pain, severe low back pain, vaginal bleeding, or any other pregnancy related emergency, please go to the maternity assessment unit located at entrance C of [facility].  

## 2021-10-02 NOTE — ED Triage Notes (Signed)
Pt states she wants a pregnancy test done on blood. States the urine tests are not accurate for her.  C/O lower back pain. LMP 08/29/21

## 2021-10-02 NOTE — ED Provider Notes (Signed)
[facility]    CSN: [id number] Arrival date & time: 10/02/21  0998      History   Chief Complaint Chief Complaint  Patient presents with   Possible Pregnancy    HPI Deborah Lawson is a 31 y.o. female.   Patient presents urgent care for evaluation of possible pregnancy.  Her last menstrual cycle was on August 29, 2021 and she states that her periods are very regular.  She is currently 4 days late for her menstrual cycle and is requesting blood testing to either confirm or deny pregnant state.  She states that the urine tests "are not accurate".  When she was pregnant with her second child, the urine pregnancy test did not detect pregnancy when the blood test did.  Her last pregnancy was able to be detected by urine pregnancy test.  She is reporting a small amount of low back pain and suprapubic abdominal pain today that started on September 27, 2021.  Denies numbness and tingling to the bilateral lower extremities.  Denies vaginal bleeding, discharge, odor, and itch.  Denies urinary frequency, dysuria, urgency, hesitancy, and fever/chills.  Reports a small amount of nausea that just started today with no vomiting or diarrhea reported.  Last normal bowel movement was today.  No blood or mucus to the stools.  Patient is sexually active with one monogamous female partner and reports unprotected intercourse.  Declines STI testing today.  Has not taken any over-the-counter medications for her symptoms prior to arrival to urgent care.    Possible Pregnancy    Past Medical History:  Diagnosis Date   Gestational diabetes    Gestational hypertension    Medical history non-contributory     Patient Active Problem List   Diagnosis Date Noted   White matter abnormality on MRI of brain 07/06/2021   Abnormal computed tomography of paranasal sinus 07/06/2021   Vitamin D deficiency 11/11/2020   Iron deficiency 11/11/2020   Gestational diabetes    Gestational hypertension    Breech  presentation of fetus 10/26/2019   Language barrier 10/12/2019   Malpresentation before onset of labor 10/12/2019    Past Surgical History:  Procedure Laterality Date   CESAREAN SECTION N/A 10/26/2019   Procedure: CESAREAN SECTION;  Surgeon: Reva Bores, MD;  Location: [facility];  Service: Obstetrics;  Laterality: N/A;   MANDIBLE SURGERY      OB History     Gravida  3   Para  3   Term  3   Preterm  0   AB  0   Living  3      SAB  0   IAB  0   Ectopic  0   Multiple  0   Live Births  3            Home Medications    Prior to Admission medications   Medication Sig Start Date End Date Taking? Authorizing Provider  Prenatal Vit-Fe Fumarate-FA (PRENATAL COMPLETE) 14-0.4 MG TABS Take 1 tablet/day. 10/02/21  Yes Gianni Fuchs, Donavan Burnet, FNP  albuterol (VENTOLIN HFA) 108 (90 Base) MCG/ACT inhaler Inhale 1-2 puffs into the lungs every 6 (six) hours as needed for wheezing or shortness of breath. 06/03/21   Philip Aspen, Limmie Patricia, MD  cephALEXin (KEFLEX) 500 MG capsule Take 1 capsule (500 mg total) by mouth 3 (three) times daily. 09/22/21   Raspet, Noberto Retort, PA-C  cetirizine (ZYRTEC ALLERGY) 10 MG tablet Take 1 tablet (10 mg total) by mouth daily.  05/18/21   Raspet, Erin K, PA-C  fluticasone (FLONASE) 50 MCG/ACT nasal spray Place 1 spray into both nostrils daily. 05/18/21   Raspet, Noberto Retort, PA-C  gabapentin (NEURONTIN) 300 MG capsule Take 1 capsule (300 mg total) by mouth at bedtime. 1 capsule twice daily 07/08/21   Drema Dallas, DO  Iron, Ferrous Sulfate, 325 (65 Fe) MG TABS Take 325 mg by mouth daily. 11/11/20   Philip Aspen, Limmie Patricia, MD  metroNIDAZOLE (FLAGYL) 500 MG tablet Take 1 tablet (500 mg total) by mouth 2 (two) times daily. 09/24/21   Lamptey, Britta Mccreedy, MD  Vitamin D, Ergocalciferol, (DRISDOL) 1.25 MG (50000 UNIT) CAPS capsule Take 1 capsule (50,000 Units total) by mouth every 7 (seven) days for 12 doses. 10/01/21 12/18/21  Philip Aspen, Limmie Patricia, MD  NIFEdipine  (PROCARDIA XL) 30 MG 24 hr tablet Take 1 tablet (30 mg total) by mouth daily. 11/05/19 02/18/20  Donette Larry, CNM    Family History Family History  Problem Relation Age of Onset   Hypertension Father     Social History Social History   Tobacco Use   Smoking status: Never   Smokeless tobacco: Never  Vaping Use   Vaping Use: Never used  Substance Use Topics   Alcohol use: Never   Drug use: Never     Allergies   Patient has no known allergies.   Review of Systems Review of Systems Per HPI  Physical Exam Triage Vital Signs ED Triage Vitals [10/02/21 1015]  Enc Vitals Group     BP 128/78     Pulse Rate 99     Resp 16     Temp 98.1 F (36.7 C)     Temp Source Oral     SpO2 99 %     Weight      Height      Head Circumference      Peak Flow      Pain Score 8     Pain Loc      Pain Edu?      Excl. in GC?    No data found.  Updated Vital Signs BP 128/78 (BP Location: Left Arm)   Pulse 99   Temp 98.1 F (36.7 C) (Oral)   Resp 16   LMP 08/29/2021 (Exact Date)   SpO2 99%   Visual Acuity Right Eye Distance:   Left Eye Distance:   Bilateral Distance:    Right Eye Near:   Left Eye Near:    Bilateral Near:     Physical Exam Vitals and nursing note reviewed.  Constitutional:      Appearance: Normal appearance. She is not ill-appearing or toxic-appearing.     Comments: Very pleasant patient sitting on exam in position of comfort table in no acute distress.   HENT:     Head: Normocephalic and atraumatic.     Right Ear: Hearing and external ear normal.     Left Ear: Hearing and external ear normal.     Nose: Nose normal.     Mouth/Throat:     Lips: Pink.     Mouth: Mucous membranes are moist.  Eyes:     General: Lids are normal. Vision grossly intact. Gaze aligned appropriately.     Extraocular Movements: Extraocular movements intact.     Conjunctiva/sclera: Conjunctivae normal.  Cardiovascular:     Rate and Rhythm: Normal rate and regular  rhythm.     Heart sounds: Normal heart sounds, S1 normal and S2 normal.  Pulmonary:     Effort: Pulmonary effort is normal. No respiratory distress.     Breath sounds: Normal breath sounds and air entry.  Abdominal:     General: Abdomen is flat. Bowel sounds are normal.     Palpations: Abdomen is soft.     Tenderness: There is abdominal tenderness. There is no right CVA tenderness, left CVA tenderness or guarding.     Comments: Mild suprapubic abdominal tenderness to palpation of abdomen present.    Musculoskeletal:     Cervical back: Neck supple.  Skin:    General: Skin is warm and dry.     Capillary Refill: Capillary refill takes less than 2 seconds.     Findings: No rash.  Neurological:     General: No focal deficit present.     Mental Status: She is alert and oriented to person, place, and time. Mental status is at baseline.     Cranial Nerves: No dysarthria or facial asymmetry.     Gait: Gait is intact.  Psychiatric:        Mood and Affect: Mood normal.        Speech: Speech normal.        Behavior: Behavior normal.        Thought Content: Thought content normal.        Judgment: Judgment normal.      UC Treatments / Results  Labs (all labs ordered are listed, but only abnormal results are displayed) Labs Reviewed  POC URINE PREG, ED - Abnormal; Notable for the following components:      Result Value   Preg Test, Ur POSITIVE (*)    All other components within normal limits  POCT URINALYSIS DIPSTICK, ED / UC - Abnormal; Notable for the following components:   Hgb urine dipstick TRACE (*)    pH 8.5 (*)    Leukocytes,Ua SMALL (*)    All other components within normal limits    EKG   Radiology No results found.  Procedures Procedures (including critical care time)  Medications Ordered in UC Medications - No data to display  Initial Impression / Assessment and Plan / UC Course  I have reviewed the triage vital signs and the nursing notes.  Pertinent labs &  imaging results that were available during my care of the patient were reviewed by me and considered in my medical decision making (see chart for details).   1. Positive pregnancy test Urine pregnancy test is positive in the clinic.  Patient to start taking prenatal vitamin daily and stop taking ibuprofen for muscle aches and pain.  She may take Tylenol instead.  Patient has been given a list of over-the-counter medications appropriate for use in pregnancy and has been instructed not to take any other medications over-the-counter that are not on this list.  Patient given list of OB/GYN providers with [facility] and instructed to call to make an appointment for ongoing pregnancy care.  MAU precautions given.   Urinalysis shows small hemoglobin and small leukocytes.  Urine culture sent.  Patient had moderate leukocytes in her urine a couple of weeks ago when she was seen in urgent care for low back pain.  She was prescribed Keflex at that time and a urine culture was sent off but did not grow enough colonies to be considered appropriate for antimicrobial therapy at that time.  We will treat with antibiotics if necessary based on urine culture.  Suspect this is the cause of patient's suprapubic abdominal pain and  low back discomfort.  She may take Tylenol as needed for this.  Discussed physical exam and available lab work findings in clinic with patient.  Counseled patient regarding appropriate use of medications and potential side effects for all medications recommended or prescribed today. Discussed red flag signs and symptoms of worsening condition,when to call the PCP office, return to urgent care, and when to seek higher level of care in the emergency department. Patient verbalizes understanding and agreement with plan. All questions answered. Patient discharged in stable condition.  Final Clinical Impressions(s) / UC Diagnoses   Final diagnoses:  Positive pregnancy test  Nausea  Abdominal pain,  unspecified abdominal location     Discharge Instructions      Your pregnancy test is positive.  Start taking the prescribed prenatal vitamin daily.  Refer to the list of safe medications in pregnancy you were given at urgent care today and do not take any other medications over-the-counter other than what is on this list.  You may only take Tylenol for pain and discomfort/fever.  Do not take ibuprofen in pregnancy.   Drink plenty of water (at least 8 cups/day) to stay well-hydrated.   Call one of the following clinics to schedule an appointment for your pregnancy care.  [facility] for [facility]            [address] [place], [place] [address] [phone]   Center for [facility] at [facility]                                                            [address], [address], [place], [place], [address] [phone]   Center for [facility] at [facility] [address], [place], [place], [address] [phone]   Center for [facility] at [facility] [address], [place], [place], [address] [phone]   Center for [facility] at [facility]                                [address] [place], [place], [place], [address] [phone]   Center for [facility]                                   [address] [place], [place], [address] [phone]   Center for [facility] at [facility] [address], [address], [place], [place], [address]        If you develop any severe abdominal pain, severe low back pain, vaginal bleeding, or any other pregnancy related emergency, please go to the maternity assessment unit located at entrance C of [facility].     ED Prescriptions     Medication Sig Dispense Auth. Provider   Prenatal Vit-Fe Fumarate-FA (PRENATAL COMPLETE) 14-0.4 MG TABS Take 1 tablet/day. 60 tablet Carlisle Beers,  FNP      PDMP not reviewed this encounter.   Carlisle Beers, [place] 10/02/21 1248

## 2021-10-03 LAB — URINE CULTURE: Culture: <10000 — AB

## 2021-10-25 ENCOUNTER — Ambulatory Visit (HOSPITAL_COMMUNITY)
Admission: EM | Admit: 2021-10-25 | Discharge: 2021-10-25 | Disposition: A | Discharge status: Home / Self Care | Payer: Medicaid Other | Attending: Emergency Medicine | Admitting: Emergency Medicine

## 2021-10-25 ENCOUNTER — Encounter (HOSPITAL_COMMUNITY): Payer: Self-pay

## 2021-10-25 DIAGNOSIS — O26891 Other specified pregnancy related conditions, first trimester: Secondary | ICD-10-CM

## 2021-10-25 DIAGNOSIS — M545 Low back pain, unspecified: Secondary | ICD-10-CM | POA: Insufficient documentation

## 2021-10-25 DIAGNOSIS — Z3A08 8 weeks gestation of pregnancy: Secondary | ICD-10-CM | POA: Insufficient documentation

## 2021-10-25 LAB — POCT URINALYSIS DIPSTICK, ED / UC
Bilirubin Urine: NEGATIVE
Glucose, UA: NEGATIVE mg/dL
Ketones, ur: NEGATIVE mg/dL
Nitrite: NEGATIVE
Protein, ur: NEGATIVE mg/dL
Specific Gravity, Urine: 1.02 (ref 1.005–1.030)
Urobilinogen, UA: 0.2 mg/dL (ref 0.0–1.0)
pH: 7 (ref 5.0–8.0)

## 2021-10-25 NOTE — ED Triage Notes (Signed)
Pt reports being [redacted] weeks pregnant and c/o lower back pain x 2 days.

## 2021-10-25 NOTE — ED Provider Notes (Signed)
[place]    CSN: [id number] Arrival date & time: 10/25/21  1004     History   Chief Complaint Chief Complaint  Patient presents with   Back Pain    HPI Deborah Lawson is a 31 y.o. female.  Presents with 2-day history of low back pain. She is currently [redacted] weeks pregnant, positive test confirmed at this urgent care on 7/21.  At that time she also had low back pain.  Urinalysis and culture were unremarkable at that time. Pain today is similar to the back pain before. Denies any urinary symptoms, vaginal discharge, bleeding, or spotting. No abdominal pain. Occasional nausea and vomiting. No recent trauma or injury. She has not tried any medication for pain  Past Medical History:  Diagnosis Date   Gestational diabetes    Gestational hypertension    Medical history non-contributory     Patient Active Problem List   Diagnosis Date Noted   White matter abnormality on MRI of brain 07/06/2021   Abnormal computed tomography of paranasal sinus 07/06/2021   Vitamin D deficiency 11/11/2020   Iron deficiency 11/11/2020   Gestational diabetes    Gestational hypertension    Breech presentation of fetus 10/26/2019   Language barrier 10/12/2019   Malpresentation before onset of labor 10/12/2019    Past Surgical History:  Procedure Laterality Date   CESAREAN SECTION N/A 10/26/2019   Procedure: CESAREAN SECTION;  Surgeon: Donnamae Jude, MD;  Location: [facility];  Service: Obstetrics;  Laterality: N/A;   MANDIBLE SURGERY      OB History     Gravida  3   Para  3   Term  3   Preterm  0   AB  0   Living  3      SAB  0   IAB  0   Ectopic  0   Multiple  0   Live Births  3            Home Medications    Prior to Admission medications   Medication Sig Start Date End Date Taking? Authorizing Provider  albuterol (VENTOLIN HFA) 108 (90 Base) MCG/ACT inhaler Inhale 1-2 puffs into the lungs every 6 (six) hours as needed for wheezing or shortness of  breath. 06/03/21   Isaac Bliss, Rayford Halsted, MD  cephALEXin (KEFLEX) 500 MG capsule Take 1 capsule (500 mg total) by mouth 3 (three) times daily. 09/22/21   Raspet, Derry Skill, PA-C  cetirizine (ZYRTEC ALLERGY) 10 MG tablet Take 1 tablet (10 mg total) by mouth daily. 05/18/21   Raspet, Erin K, PA-C  fluticasone (FLONASE) 50 MCG/ACT nasal spray Place 1 spray into both nostrils daily. 05/18/21   Raspet, Derry Skill, PA-C  gabapentin (NEURONTIN) 300 MG capsule Take 1 capsule (300 mg total) by mouth at bedtime. 1 capsule twice daily 07/08/21   Pieter Partridge, DO  Iron, Ferrous Sulfate, 325 (65 Fe) MG TABS Take 325 mg by mouth daily. 11/11/20   Isaac Bliss, Rayford Halsted, MD  metroNIDAZOLE (FLAGYL) 500 MG tablet Take 1 tablet (500 mg total) by mouth 2 (two) times daily. 09/24/21   Chase Picket, MD  Prenatal Vit-Fe Fumarate-FA (PRENATAL COMPLETE) 14-0.4 MG TABS Take 1 tablet/day. 10/02/21   Talbot Grumbling, FNP  Vitamin D, Ergocalciferol, (DRISDOL) 1.25 MG (50000 UNIT) CAPS capsule Take 1 capsule (50,000 Units total) by mouth every 7 (seven) days for 12 doses. 10/01/21 12/18/21  Erline Hau, MD  NIFEdipine (PROCARDIA XL)  30 MG 24 hr tablet Take 1 tablet (30 mg total) by mouth daily. 11/05/19 02/18/20  Donette Larry, CNM    Family History Family History  Problem Relation Age of Onset   Hypertension Father     Social History Social History   Tobacco Use   Smoking status: Never   Smokeless tobacco: Never  Vaping Use   Vaping Use: Never used  Substance Use Topics   Alcohol use: Never   Drug use: Never     Allergies   Patient has no known allergies.   Review of Systems Review of Systems  Musculoskeletal:  Positive for back pain.   Per HPI  Physical Exam Triage Vital Signs ED Triage Vitals [10/25/21 1021]  Enc Vitals Group     BP 128/76     Pulse Rate 100     Resp 18     Temp 98.3 F (36.8 C)     Temp Source Oral     SpO2 100 %     Weight      Height      Head  Circumference      Peak Flow      Pain Score      Pain Loc      Pain Edu?      Excl. in GC?    No data found.  Updated Vital Signs BP 128/76 (BP Location: Left Arm)   Pulse 100   Temp 98.3 F (36.8 C) (Oral)   Resp 18   LMP 08/29/2021 (Exact Date)   SpO2 100%     Physical Exam Vitals and nursing note reviewed.  Constitutional:      General: She is not in acute distress. HENT:     Mouth/Throat:     Mouth: Mucous membranes are moist.     Pharynx: Oropharynx is clear.  Eyes:     Conjunctiva/sclera: Conjunctivae normal.  Cardiovascular:     Rate and Rhythm: Normal rate and regular rhythm.     Pulses: Normal pulses.     Heart sounds: Normal heart sounds.  Pulmonary:     Effort: Pulmonary effort is normal.     Breath sounds: Normal breath sounds.  Abdominal:     Palpations: Abdomen is soft.     Tenderness: There is no abdominal tenderness.  Musculoskeletal:        General: Normal range of motion.  Neurological:     Mental Status: She is alert and oriented to person, place, and time.     Sensory: Sensation is intact.     Motor: Motor function is intact. No weakness.     Coordination: Coordination is intact.     Gait: Gait is intact.     Deep Tendon Reflexes: Reflexes are normal and symmetric.     Comments: Strength 5/5 all extremities     UC Treatments / Results  Labs (all labs ordered are listed, but only abnormal results are displayed) Labs Reviewed  POCT URINALYSIS DIPSTICK, ED / UC - Abnormal; Notable for the following components:      Result Value   Hgb urine dipstick TRACE (*)    Leukocytes,Ua SMALL (*)    All other components within normal limits  URINE CULTURE    EKG  Radiology No results found.  Procedures Procedures   Medications Ordered in UC Medications - No data to display  Initial Impression / Assessment and Plan / UC Course  I have reviewed the triage vital signs and the nursing notes.  Pertinent labs &  imaging results that were  available during my care of the patient were reviewed by me and considered in my medical decision making (see chart for details).  Patient asked for xray however we discussed that is not safe during pregnancy No spinal tenderness of the back Urinalysis with small leuks, trace hgb. Culture pending - will treat with abx for any positive culture. Recommend tylenol for back pain. She has ob/gyn appointment on 8/22. I recommend going to the MAU if symptoms persist or worsen before her appointment time. Strict MAU precautions.  Final Clinical Impressions(s) / UC Diagnoses   Final diagnoses:  Acute bilateral low back pain without sciatica  [redacted] weeks gestation of pregnancy     Discharge Instructions      You can use tylenol for pain during pregnancy.  Please follow up with your ob/gyn regarding your symptoms.  With any persistent or worsening symptoms, please go to the MAU (address attached). This is the emergency department for pregnant people.     ED Prescriptions   None    PDMP not reviewed this encounter.   Lilyanne Mcquown, [place] 10/25/21 1122

## 2021-10-25 NOTE — Discharge Instructions (Addendum)
You can use tylenol for pain during pregnancy.  Please follow up with your ob/gyn regarding your symptoms.  With any persistent or worsening symptoms, please go to the MAU (address attached). This is the emergency department for pregnant people.

## 2021-10-26 LAB — URINE CULTURE: Culture: >=100000 — AB

## 2021-11-01 ENCOUNTER — Inpatient Hospital Stay (EMERGENCY_DEPARTMENT_HOSPITAL)
Admission: AD | Admit: 2021-11-01 | Discharge: 2021-11-02 | Disposition: A | Discharge status: Home / Self Care | Payer: Medicaid Other | Source: Home / Self Care | Admitting: Obstetrics and Gynecology

## 2021-11-01 ENCOUNTER — Inpatient Hospital Stay (HOSPITAL_COMMUNITY): Payer: Medicaid Other

## 2021-11-01 ENCOUNTER — Encounter (HOSPITAL_COMMUNITY): Payer: Self-pay

## 2021-11-01 ENCOUNTER — Inpatient Hospital Stay (HOSPITAL_COMMUNITY)
Admission: AD | Admit: 2021-11-01 | Discharge: 2021-11-01 | Disposition: A | Discharge status: Home / Self Care | Payer: Medicaid Other | Attending: Obstetrics and Gynecology | Admitting: Obstetrics and Gynecology

## 2021-11-01 DIAGNOSIS — N83201 Unspecified ovarian cyst, right side: Secondary | ICD-10-CM | POA: Insufficient documentation

## 2021-11-01 DIAGNOSIS — Z3A01 Less than 8 weeks gestation of pregnancy: Secondary | ICD-10-CM | POA: Insufficient documentation

## 2021-11-01 DIAGNOSIS — O209 Hemorrhage in early pregnancy, unspecified: Secondary | ICD-10-CM

## 2021-11-01 DIAGNOSIS — Z3A09 9 weeks gestation of pregnancy: Secondary | ICD-10-CM | POA: Insufficient documentation

## 2021-11-01 DIAGNOSIS — O3481 Maternal care for other abnormalities of pelvic organs, first trimester: Secondary | ICD-10-CM | POA: Insufficient documentation

## 2021-11-01 DIAGNOSIS — Z3491 Encounter for supervision of normal pregnancy, unspecified, first trimester: Secondary | ICD-10-CM

## 2021-11-01 DIAGNOSIS — N939 Abnormal uterine and vaginal bleeding, unspecified: Secondary | ICD-10-CM | POA: Diagnosis not present

## 2021-11-01 DIAGNOSIS — O468X1 Other antepartum hemorrhage, first trimester: Secondary | ICD-10-CM | POA: Insufficient documentation

## 2021-11-01 DIAGNOSIS — O208 Other hemorrhage in early pregnancy: Secondary | ICD-10-CM | POA: Diagnosis not present

## 2021-11-01 DIAGNOSIS — O26851 Spotting complicating pregnancy, first trimester: Secondary | ICD-10-CM | POA: Insufficient documentation

## 2021-11-01 LAB — WET PREP, GENITAL
Clue Cells Wet Prep HPF POC: NONE SEEN
Sperm: NONE SEEN
Trich, Wet Prep: NONE SEEN
WBC, Wet Prep HPF POC: <10 (ref <10)
Yeast Wet Prep HPF POC: NONE SEEN

## 2021-11-01 LAB — CBC
HCT: 38.6 % (ref 36.0–46.0)
Hemoglobin: 12.8 g/dL (ref 12.0–15.0)
MCH: 26.9 pg (ref 26.0–34.0)
MCHC: 33.2 g/dL (ref 30.0–36.0)
MCV: 81.1 fL (ref 80.0–100.0)
Platelets: 414 10*3/uL — ABNORMAL HIGH (ref 150–400)
RBC: 4.76 MIL/uL (ref 3.87–5.11)
RDW: 14.3 % (ref 11.5–15.5)
WBC: 7.3 10*3/uL (ref 4.0–10.5)
nRBC: 0 % (ref 0.0–0.2)

## 2021-11-01 LAB — ABO/RH: ABO/RH(D): O POS

## 2021-11-01 LAB — URINALYSIS, ROUTINE W REFLEX MICROSCOPIC
Bilirubin Urine: NEGATIVE
Glucose, UA: NEGATIVE mg/dL
Ketones, ur: NEGATIVE mg/dL
Nitrite: NEGATIVE
Protein, ur: NEGATIVE mg/dL
Specific Gravity, Urine: <1.005 — ABNORMAL LOW (ref 1.005–1.030)
pH: 6 (ref 5.0–8.0)

## 2021-11-01 LAB — URINALYSIS, MICROSCOPIC (REFLEX)

## 2021-11-01 LAB — HCG, QUANTITATIVE, PREGNANCY: hCG, Beta Chain, Quant, S: 15494 m[IU]/mL — ABNORMAL HIGH (ref <5)

## 2021-11-01 NOTE — MAU Note (Signed)
.  Deborah Lawson is a 31 y.o. at [redacted]w[redacted]d here in MAU reporting: was seen this morning for small amount of vaginal bleeding. She was sent home and told to return to MAU for [place] once she could drop off her children. She reports lower abdominal pain that wraps around to her back (8/10) and denies any other new complaints.   Onset of complaint: this morning Pain score: 8/10 Vitals:   11/01/21 2136  BP: 130/77  Pulse: (!) 103  Resp: 16  Temp: 98.5 F (36.9 C)  SpO2: 100%

## 2021-11-01 NOTE — ED Provider Triage Note (Signed)
Emergency Medicine Provider Triage Evaluation Note  Deborah Lawson , a 31 y.o. female  was evaluated in triage.  Pt complains of vaginal bleeding.  Patient has [redacted] week pregnant.  seen at MAU this morning.  Patient had children with her and could not get ultrasound done this morning.  Patient returns tonight to have this completed.  Review of Systems  Positive: As above Negative: As above  Physical Exam  LMP 08/29/2021 (Exact Date)  Gen:   Awake, no distress   Resp:  Normal effort  MSK:   Moves extremities without difficulty  Other:   Medical Decision Making  Medically screening exam initiated at 8:42 PM.  Appropriate orders placed.  Deborah Lawson was informed that the remainder of the evaluation will be completed by another provider, this initial triage assessment does not replace that evaluation, and the importance of remaining in the ED until their evaluation is complete.  Discussed with MAU APP who accepts patient.   Deborah [place], PA-C 11/01/21 2044

## 2021-11-01 NOTE — MAU Note (Signed)
SW called regarding childcare issue.  Pt has 3 children with her, presented with abd pain and bleeding. Has no one available to come get children.    Message left.

## 2021-11-01 NOTE — MAU Provider Note (Signed)
History     CSN: [id number]  Arrival date and time: 11/01/21 2026   None     Chief Complaint  Patient presents with   Vaginal Bleeding   HPI  Deborah Lawson is a 31 y.o. [phone] who presents for completion of her first trimester work up for pregnancy. She was seen this am and had labs but was not able to find childcare to complete the ultrasound. She has no pain or bleeding at this time.   OB History     Gravida  4   Para  3   Term  3   Preterm  0   AB  0   Living  3      SAB  0   IAB  0   Ectopic  0   Multiple  0   Live Births  3           Past Medical History:  Diagnosis Date   Gestational diabetes    Gestational hypertension    Medical history non-contributory     Past Surgical History:  Procedure Laterality Date   CESAREAN SECTION N/A 10/26/2019   Procedure: CESAREAN SECTION;  Surgeon: Reva Bores, MD;  Location: [facility];  Service: Obstetrics;  Laterality: N/A;   MANDIBLE SURGERY      Family History  Problem Relation Age of Onset   Hypertension Father     Social History   Tobacco Use   Smoking status: Never   Smokeless tobacco: Never  Vaping Use   Vaping Use: Never used  Substance Use Topics   Alcohol use: Never   Drug use: Never    Allergies: No Known Allergies  Medications Prior to Admission  Medication Sig Dispense Refill Last Dose   albuterol (VENTOLIN HFA) 108 (90 Base) MCG/ACT inhaler Inhale 1-2 puffs into the lungs every 6 (six) hours as needed for wheezing or shortness of breath. 8 g 0    cephALEXin (KEFLEX) 500 MG capsule Take 1 capsule (500 mg total) by mouth 3 (three) times daily. 15 capsule 0    cetirizine (ZYRTEC ALLERGY) 10 MG tablet Take 1 tablet (10 mg total) by mouth daily. 30 tablet 1    fluticasone (FLONASE) 50 MCG/ACT nasal spray Place 1 spray into both nostrils daily. 16 g 0    gabapentin (NEURONTIN) 300 MG capsule Take 1 capsule (300 mg total) by mouth at bedtime. 1 capsule twice daily 30 capsule  5    Iron, Ferrous Sulfate, 325 (65 Fe) MG TABS Take 325 mg by mouth daily. 30 tablet     metroNIDAZOLE (FLAGYL) 500 MG tablet Take 1 tablet (500 mg total) by mouth 2 (two) times daily. 14 tablet 0    Prenatal Vit-Fe Fumarate-FA (PRENATAL COMPLETE) 14-0.4 MG TABS Take 1 tablet/day. 60 tablet 1    Vitamin D, Ergocalciferol, (DRISDOL) 1.25 MG (50000 UNIT) CAPS capsule Take 1 capsule (50,000 Units total) by mouth every 7 (seven) days for 12 doses. 12 capsule 0     Review of Systems  Constitutional: Negative.  Negative for fatigue and fever.  HENT: Negative.    Respiratory: Negative.  Negative for shortness of breath.   Cardiovascular: Negative.  Negative for chest pain.  Gastrointestinal:  Positive for abdominal pain. Negative for constipation, diarrhea, nausea and vomiting.  Genitourinary: Negative.  Negative for dysuria, vaginal bleeding and vaginal discharge.  Neurological: Negative.  Negative for dizziness and headaches.   Physical Exam   Blood pressure 130/77, pulse (!) 103, temperature 98.5  F (36.9 C), temperature source Oral, resp. rate 16, height 5\' 1"  (1.549 m), weight 90.3 kg, last menstrual period 08/29/2021, SpO2 100 %.  Patient Vitals for the past 24 hrs:  BP Temp Temp src Pulse Resp SpO2 Height Weight  11/01/21 2136 130/77 98.5 F (36.9 C) Oral (!) 103 16 100 % 5\' 1"  (1.549 m) 90.3 kg    Physical Exam Vitals and nursing note reviewed.  Constitutional:      General: She is not in acute distress.    Appearance: She is well-developed.  HENT:     Head: Normocephalic.  Eyes:     Pupils: Pupils are equal, round, and reactive to light.  Cardiovascular:     Rate and Rhythm: Normal rate and regular rhythm.     Heart sounds: Normal heart sounds.  Pulmonary:     Effort: Pulmonary effort is normal. No respiratory distress.     Breath sounds: Normal breath sounds.  Abdominal:     General: Bowel sounds are normal. There is no distension.     Palpations: Abdomen is soft.      Tenderness: There is no abdominal tenderness.  Skin:    General: Skin is warm and dry.  Neurological:     Mental Status: She is alert and oriented to person, place, and time.  Psychiatric:        Mood and Affect: Mood normal.        Behavior: Behavior normal.        Thought Content: Thought content normal.        Judgment: Judgment normal.    MAU Course  Procedures  Results for orders placed or performed during the hospital encounter of 11/01/21 (from the past 24 hour(s))  CBC     Status: Abnormal   Collection Time: 11/01/21  7:06 AM  Result Value Ref Range   WBC 7.3 4.0 - 10.5 K/uL   RBC 4.76 3.87 - 5.11 MIL/uL   Hemoglobin 12.8 12.0 - 15.0 g/dL   HCT 11/03/21 11/03/21 - 18.8 %   MCV 81.1 80.0 - 100.0 fL   MCH 26.9 26.0 - 34.0 pg   MCHC 33.2 30.0 - 36.0 g/dL   RDW 41.6 60.6 - 30.1 %   Platelets 414 (H) 150 - 400 K/uL   nRBC 0.0 0.0 - 0.2 %  ABO/Rh     Status: None   Collection Time: 11/01/21  7:06 AM  Result Value Ref Range   ABO/RH(D) O POS    No rh immune globuloin      NOT A RH IMMUNE GLOBULIN CANDIDATE, PT RH POSITIVE Performed at [facility], [address], [place], [address] [place]   hCG, quantitative, pregnancy     Status: Abnormal   Collection Time: 11/01/21  7:06 AM  Result Value Ref Range   hCG, Beta Chain, Quant, S 15,494 (H) <5 mIU/mL  Wet prep, genital     Status: None   Collection Time: 11/01/21  7:26 AM   Specimen: Urine, Clean Catch  Result Value Ref Range   Yeast Wet Prep HPF POC NONE SEEN NONE SEEN   Trich, Wet Prep NONE SEEN NONE SEEN   Clue Cells Wet Prep HPF POC NONE SEEN NONE SEEN   WBC, Wet Prep HPF POC <10 <10   Sperm NONE SEEN   Urinalysis, Routine w reflex microscopic Urine, Clean Catch     Status: Abnormal   Collection Time: 11/01/21  7:28 AM  Result Value Ref Range   Color,  Urine YELLOW YELLOW   APPearance CLEAR CLEAR   Specific Gravity, Urine <1.005 (L) 1.005 - 1.030   pH 6.0 5.0 - 8.0   Glucose, UA NEGATIVE NEGATIVE mg/dL   Hgb  urine dipstick LARGE (A) NEGATIVE   Bilirubin Urine NEGATIVE NEGATIVE   Ketones, ur NEGATIVE NEGATIVE mg/dL   Protein, ur NEGATIVE NEGATIVE mg/dL   Nitrite NEGATIVE NEGATIVE   Leukocytes,Ua MODERATE (A) NEGATIVE  Urinalysis, Microscopic (reflex)     Status: Abnormal   Collection Time: 11/01/21  7:28 AM  Result Value Ref Range   RBC / HPF 6-10 0 - 5 RBC/hpf   WBC, UA 11-20 0 - 5 WBC/hpf   Bacteria, UA RARE (A) NONE SEEN   Squamous Epithelial / LPF 21-50 0 - 5     US OB LESS THAN 14 WEEKS WITH OB TRANSVAGINAL  Result Date: 11/01/2021 CLINICAL DATA:  Bleeding and cramping. EXAM: OBSTETRIC <14 WK [place] AND TRANSVAGINAL OB US TECHNIQUE: Both transabdominal and transvaginal ultrasound examinations were performed for complete evaluation of the gestation as well as the maternal uterus, adnexal regions, and pelvic cul-de-sac. Transvaginal technique was performed to assess early pregnancy. COMPARISON:  None Available. FINDINGS: Intrauterine gestational sac: Single Yolk sac:  Yes Embryo:  Yes Cardiac Activity: No Heart Rate: None. CRL:  3.3 mm   6 w   sero d                  [place] EDC: 06/27/2022 Subchorionic hemorrhage:  Small subchorionic hemorrhage. Maternal uterus/adnexae: A thick walled cyst is noted in the right ovary suggesting corpus luteal cyst. The left ovary is within normal limits. Trace amount of free fluid in the pelvis. IMPRESSION: Single intrauterine pregnancy containing a yolk sac and fetal pole. No fetal cardiac activity or heart rate at this time. Differential diagnosis includes failed early pregnancy versus early pregnancy. Short-term follow-up ultrasound is recommended. Electronically Signed   By: Thornell Sartorius M.D.   On: 11/01/2021 23:29     MDM  UA, UPT CBC, HCG ABO/Rh- O Pos Wet prep and gc/chlamydia US OB Comp Less 14 weeks with Transvaginal  Discussed early IUP with no FHR yet. Reviewed recommendation for repeat in 10-14 days  Assessment and Plan   1. Normal intrauterine  pregnancy on prenatal ultrasound in first trimester   2. [redacted] weeks gestation of pregnancy     -Discharge home in stable condition -Outpatient ultrasound ordered -First trimester precautions discussed -Patient advised to follow-up with [facility] in 10-14 days for repeat ultrasound -Patient may return to MAU as needed or if her condition were to change or worsen  Rolm Bookbinder, CNM 11/01/2021, 11:32 PM

## 2021-11-01 NOTE — MAU Provider Note (Addendum)
History     CSN: [id number]  Arrival date and time: 11/01/21 7106   Event Date/Time   First Provider Initiated Contact with Patient 11/01/21 [phone]      Chief Complaint  Patient presents with   Vaginal Bleeding   Deborah Lawson, a  31 y.o. [id number] at [redacted]w[redacted]d presents to MAU with complaints of vaginal spotting with wiping this am at 5am. Patient states she has not seen blood since. She denies wearing a pad or passing clots. She also endorses pelvic pain that has been on-going for the last "week or so." Patient describes pain as "cramping" and points to lower pelvis. She rates the pain 9/10 and denies attempting to relieve pain. She denies urinary symptoms but endorses "some pain with sex." Denies abnormal discharge and problems with constipation or diarrhea. Patient had a positive UPT "about 1 month ago."        Vaginal Bleeding The patient's pertinent negatives include no pelvic pain or vaginal discharge. Associated symptoms include abdominal pain and dysuria. Pertinent negatives include no back pain, chills, constipation, diarrhea, fever, flank pain, headaches, nausea or vomiting.    OB History     Gravida  4   Para  3   Term  3   Preterm  0   AB  0   Living  3      SAB  0   IAB  0   Ectopic  0   Multiple  0   Live Births  3           Past Medical History:  Diagnosis Date   Gestational diabetes    Gestational hypertension    Medical history non-contributory     Past Surgical History:  Procedure Laterality Date   CESAREAN SECTION N/A 10/26/2019   Procedure: CESAREAN SECTION;  Surgeon: Reva Bores, MD;  Location: [facility];  Service: Obstetrics;  Laterality: N/A;   MANDIBLE SURGERY      Family History  Problem Relation Age of Onset   Hypertension Father     Social History   Tobacco Use   Smoking status: Never   Smokeless tobacco: Never  Vaping Use   Vaping Use: Never used  Substance Use Topics   Alcohol use: Never   Drug use: Never     Allergies: No Known Allergies  Medications Prior to Admission  Medication Sig Dispense Refill Last Dose   Prenatal Vit-Fe Fumarate-FA (PRENATAL COMPLETE) 14-0.4 MG TABS Take 1 tablet/day. 60 tablet 1 11/01/2021   albuterol (VENTOLIN HFA) 108 (90 Base) MCG/ACT inhaler Inhale 1-2 puffs into the lungs every 6 (six) hours as needed for wheezing or shortness of breath. 8 g 0    cephALEXin (KEFLEX) 500 MG capsule Take 1 capsule (500 mg total) by mouth 3 (three) times daily. 15 capsule 0    cetirizine (ZYRTEC ALLERGY) 10 MG tablet Take 1 tablet (10 mg total) by mouth daily. 30 tablet 1    fluticasone (FLONASE) 50 MCG/ACT nasal spray Place 1 spray into both nostrils daily. 16 g 0    gabapentin (NEURONTIN) 300 MG capsule Take 1 capsule (300 mg total) by mouth at bedtime. 1 capsule twice daily 30 capsule 5    Iron, Ferrous Sulfate, 325 (65 Fe) MG TABS Take 325 mg by mouth daily. 30 tablet     metroNIDAZOLE (FLAGYL) 500 MG tablet Take 1 tablet (500 mg total) by mouth 2 (two) times daily. 14 tablet 0    Vitamin D, Ergocalciferol, (DRISDOL) 1.25 MG (  50000 UNIT) CAPS capsule Take 1 capsule (50,000 Units total) by mouth every 7 (seven) days for 12 doses. 12 capsule 0     Review of Systems  Constitutional:  Negative for chills, fatigue and fever.  Eyes:  Negative for pain and visual disturbance.  Respiratory:  Negative for apnea, shortness of breath and wheezing.   Cardiovascular:  Negative for chest pain and palpitations.  Gastrointestinal:  Positive for abdominal pain. Negative for constipation, diarrhea, nausea and vomiting.  Genitourinary:  Positive for dysuria and vaginal bleeding. Negative for difficulty urinating, flank pain, pelvic pain, vaginal discharge and vaginal pain.  Musculoskeletal:  Negative for back pain.  Neurological:  Negative for seizures, weakness and headaches.  Psychiatric/Behavioral:  Negative for suicidal ideas.    Physical Exam   Patient Vitals for the past 24 hrs:  BP  Temp Temp src Pulse Resp SpO2 Height Weight  11/01/21 0634 124/72 -- -- -- -- -- -- --  11/01/21 0627 135/79 98 F (36.7 C) Oral 99 18 100 % 5\' 1"  (1.549 m) 89.9 kg    Physical Exam Vitals and nursing note reviewed.  Constitutional:      General: She is not in acute distress.    Appearance: Normal appearance.  HENT:     Head: Normocephalic.  Cardiovascular:     Rate and Rhythm: Normal rate and regular rhythm.  Pulmonary:     Effort: Pulmonary effort is normal.  Abdominal:     Palpations: Abdomen is soft.     Tenderness: There is no abdominal tenderness. There is no guarding.  Genitourinary:    General: Normal vulva.     Comments: Pelvic exam deferred; patient children at bedside.  Musculoskeletal:     Cervical back: Normal range of motion.  Skin:    General: Skin is warm and dry.  Neurological:     Mental Status: She is alert and oriented to person, place, and time.  Psychiatric:        Mood and Affect: Mood normal.     MAU Course  Procedures Orders Placed This Encounter  Procedures   Wet prep, genital   OB LESS THAN 14 WEEKS WITH OB TRANSVAGINAL   CBC   hCG, quantitative, pregnancy   Urinalysis, Routine w reflex microscopic Urine, Clean Catch   Diet NPO time specified   ABO/Rh   Results for orders placed or performed during the hospital encounter of 11/01/21 (from the past 24 hour(s))  CBC     Status: Abnormal   Collection Time: 11/01/21  7:06 AM  Result Value Ref Range   WBC 7.3 4.0 - 10.5 K/uL   RBC 4.76 3.87 - 5.11 MIL/uL   Hemoglobin 12.8 12.0 - 15.0 g/dL   HCT 11/03/21 25.3 - 66.4 %   MCV 81.1 80.0 - 100.0 fL   MCH 26.9 26.0 - 34.0 pg   MCHC 33.2 30.0 - 36.0 g/dL   RDW 40.3 47.4 - 25.9 %   Platelets 414 (H) 150 - 400 K/uL   nRBC 0.0 0.0 - 0.2 %  ABO/Rh     Status: None   Collection Time: 11/01/21  7:06 AM  Result Value Ref Range   ABO/RH(D) O POS    No rh immune globuloin      NOT A RH IMMUNE GLOBULIN CANDIDATE, PT RH POSITIVE Performed at [facility], [address], [place], [place]   hCG, quantitative, pregnancy     Status: Abnormal   Collection Time: 11/01/21  7:06 AM  Result Value Ref Range   hCG, Beta Chain, Quant, S 15,494 (H) <5 mIU/mL  Wet prep, genital     Status: None   Collection Time: 11/01/21  7:26 AM   Specimen: Urine, Clean Catch  Result Value Ref Range   Yeast Wet Prep HPF POC NONE SEEN NONE SEEN   Trich, Wet Prep NONE SEEN NONE SEEN   Clue Cells Wet Prep HPF POC NONE SEEN NONE SEEN   WBC, Wet Prep HPF POC <10 <10   Sperm NONE SEEN   Urinalysis, Routine w reflex microscopic Urine, Clean Catch     Status: Abnormal   Collection Time: 11/01/21  7:28 AM  Result Value Ref Range   Color, Urine YELLOW YELLOW   APPearance CLEAR CLEAR   Specific Gravity, Urine <1.005 (L) 1.005 - 1.030   pH 6.0 5.0 - 8.0   Glucose, UA NEGATIVE NEGATIVE mg/dL   Hgb urine dipstick LARGE (A) NEGATIVE   Bilirubin Urine NEGATIVE NEGATIVE   Ketones, ur NEGATIVE NEGATIVE mg/dL   Protein, ur NEGATIVE NEGATIVE mg/dL   Nitrite NEGATIVE NEGATIVE   Leukocytes,Ua MODERATE (A) NEGATIVE  Urinalysis, Microscopic (reflex)     Status: Abnormal   Collection Time: 11/01/21  7:28 AM  Result Value Ref Range   RBC / HPF 6-10 0 - 5 RBC/hpf   WBC, UA 11-20 0 - 5 WBC/hpf   Bacteria, UA RARE (A) NONE SEEN   Squamous Epithelial / LPF 21-50 0 - 5    MDM Results Pending Transfer of Care to D. Idylwood, CNM @ 0820   Claudette Head, MSN CNM 11/01/21, 6:42 AM   Offered patient an interpretor, but she declines. Labs reviewed. CBC stable. HCG >15000. Patient is not currently having any bleeding or pain.   I discussed with patient the importance of needing an ultrasound to r/o ectopic pregnancy. Patient has her 3 children with her and per ultrasound policy, cannot obtain ultrasound unless someone here to watch children. RN contacted SW, but was unable to reach someone. House coverage was also notified. Patient says husband does not get off  work until 8pm and there is no one else who can come to watch children. Dr. Shawnie Pons at bedside to perform bedside ultrasound. 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 Patient says she will come back tonight for formal ultrasound once her husband is off work. I reviewed with patient that I cannot rule out miscarriage at time given limited capability of ultrasound today, but reassured patient there is a pregnancy in her uterus. I encouraged patient to find childcare to obtain more formal ultrasound but also expressed understanding of not having support other than husband. Patient says she will return to MAU tonight for formal ultrasound. I reviewed strict return precautions.   Assessment and Plan  IUGS with probable YS  Vaginal spotting  - Discharge home in stable condition - Patient to return to MAU tonight (8/20) for ultrasound - Strict return precautions reviewed - Return to MAU sooner for new/worsening symptoms - Has NO[facility] scheduled at [facility] on Tuesday 8/22    Brand Males, CNM 11/01/21 10:35 AM

## 2021-11-01 NOTE — MAU Note (Signed)
.  Deborah Lawson is a 31 y.o. at Unknown here in MAU reporting: Small amount of vaginal bleeding that started this morning that she notices when she peed and then didn't notice any bleeding the next time she went to bathroom. Denies wearing a pad. Denies recent intercourse. She has lower abdominal and back pain that started a couple of days ago (9/10).  LMP: 09/28/21 Onset of complaint: this morning Pain score: 9/10 Patient advised of visitor policy.  Vitals:   11/01/21 0627  BP: 135/79  Pulse: 99  Resp: 18  Temp: 98 F (36.7 C)  SpO2: 100%      Lab orders placed from triage:  UA

## 2021-11-02 ENCOUNTER — Encounter (HOSPITAL_COMMUNITY): Payer: Self-pay | Admitting: Family Medicine

## 2021-11-02 ENCOUNTER — Other Ambulatory Visit: Payer: Self-pay

## 2021-11-02 ENCOUNTER — Inpatient Hospital Stay (HOSPITAL_COMMUNITY): Payer: Medicaid Other

## 2021-11-02 ENCOUNTER — Inpatient Hospital Stay (HOSPITAL_COMMUNITY)
Admission: AD | Admit: 2021-11-02 | Discharge: 2021-11-02 | Disposition: A | Discharge status: Home / Self Care | Payer: Medicaid Other | Attending: Family Medicine | Admitting: Family Medicine

## 2021-11-02 DIAGNOSIS — O208 Other hemorrhage in early pregnancy: Secondary | ICD-10-CM | POA: Insufficient documentation

## 2021-11-02 DIAGNOSIS — O3680X0 Pregnancy with inconclusive fetal viability, not applicable or unspecified: Secondary | ICD-10-CM | POA: Diagnosis not present

## 2021-11-02 DIAGNOSIS — Z3A01 Less than 8 weeks gestation of pregnancy: Secondary | ICD-10-CM | POA: Diagnosis not present

## 2021-11-02 DIAGNOSIS — O418X10 Other specified disorders of amniotic fluid and membranes, first trimester, not applicable or unspecified: Secondary | ICD-10-CM

## 2021-11-02 DIAGNOSIS — O209 Hemorrhage in early pregnancy, unspecified: Secondary | ICD-10-CM

## 2021-11-02 LAB — CULTURE, OB URINE

## 2021-11-02 LAB — GC/CHLAMYDIA PROBE AMP (~~LOC~~) NOT AT ARMC
Chlamydia: NEGATIVE
Comment: NEGATIVE
Comment: NORMAL
Neisseria Gonorrhea: NEGATIVE

## 2021-11-02 NOTE — Discharge Instructions (Signed)
Return to care  If you have heavier bleeding that soaks through more than 2 pads per hour for an hour or more If you bleed so much that you feel like you might pass out or you do pass out If you have significant abdominal pain that is not improved with Tylenol   

## 2021-11-02 NOTE — MAU Note (Signed)
Patient discharged by Cleone Slim, CNM.

## 2021-11-02 NOTE — MAU Note (Signed)
Deborah Lawson is a 31 y.o. at [redacted]w[redacted]d here in MAU reporting: she's having VB that began this morning.  Reports was seen yesterday for spotting, but states VB is heavier today.  Reports changing a sanitary napkin every 4 hours.  Reports having lower back and abdominal pain.  Hasn't taken any meds to treat pain. LMP: N/A Onset of complaint: today Pain score: 8/10 Vitals:   11/02/21 1439  BP: 126/71  Pulse: 90  Resp: 19  Temp: 98.2 F (36.8 C)  SpO2: 100%     FHT:N/A Lab orders placed from triage:   None

## 2021-11-02 NOTE — MAU Provider Note (Signed)
History     [id number]  Arrival date and time: 11/02/21 1419    Chief Complaint  Patient presents with   Vaginal Bleeding     HPI Deborah Lawson is a 31 y.o. at [redacted]w[redacted]d who presents for vaginal bleeding. Was seen for same yesterday. Reports dark red blood. Has to change a pad after 4 hours. Not passing blood clots. Denies abdominal pain.   OB History     Gravida  4   Para  3   Term  3   Preterm  0   AB  0   Living  3      SAB  0   IAB  0   Ectopic  0   Multiple  0   Live Births  3           Past Medical History:  Diagnosis Date   Gestational diabetes    Gestational hypertension     Past Surgical History:  Procedure Laterality Date   CESAREAN SECTION N/A 10/26/2019   Procedure: CESAREAN SECTION;  Surgeon: Reva Bores, MD;  Location: [facility];  Service: Obstetrics;  Laterality: N/A;   MANDIBLE SURGERY      Family History  Problem Relation Age of Onset   Hypertension Father     No Known Allergies  No current facility-administered medications on file prior to encounter.   Current Outpatient Medications on File Prior to Encounter  Medication Sig Dispense Refill   albuterol (VENTOLIN HFA) 108 (90 Base) MCG/ACT inhaler Inhale 1-2 puffs into the lungs every 6 (six) hours as needed for wheezing or shortness of breath. 8 g 0   cetirizine (ZYRTEC ALLERGY) 10 MG tablet Take 1 tablet (10 mg total) by mouth daily. 30 tablet 1   fluticasone (FLONASE) 50 MCG/ACT nasal spray Place 1 spray into both nostrils daily. 16 g 0   Iron, Ferrous Sulfate, 325 (65 Fe) MG TABS Take 325 mg by mouth daily. 30 tablet    Prenatal Vit-Fe Fumarate-FA (PRENATAL COMPLETE) 14-0.4 MG TABS Take 1 tablet/day. 60 tablet 1   Vitamin D, Ergocalciferol, (DRISDOL) 1.25 MG (50000 UNIT) CAPS capsule Take 1 capsule (50,000 Units total) by mouth every 7 (seven) days for 12 doses. 12 capsule 0   [DISCONTINUED] NIFEdipine (PROCARDIA XL) 30 MG 24 hr tablet Take 1 tablet (30 mg total) by  mouth daily. 30 tablet 1     ROS Pertinent positives and negative per HPI, all others reviewed and negative  Physical Exam   BP 126/71 (BP Location: Right Arm)   Pulse 90   Temp 98.2 F (36.8 C) (Oral)   Resp 19   Wt 89.7 kg   LMP 09/28/2021   SpO2 100%   BMI 37.36 kg/m   Patient Vitals for the past 24 hrs:  BP Temp Temp src Pulse Resp SpO2 Weight  11/02/21 1439 126/71 98.2 F (36.8 C) Oral 90 19 100 % 89.7 kg    Physical Exam Vitals and nursing note reviewed.  Constitutional:      General: She is not in acute distress.    Appearance: Normal appearance.  HENT:     Head: Normocephalic and atraumatic.  Eyes:     General: No scleral icterus.    Conjunctiva/sclera: Conjunctivae normal.  Pulmonary:     Effort: Pulmonary effort is normal. No respiratory distress.  Neurological:     Mental Status: She is alert.  Psychiatric:        Mood and Affect: Mood normal.  Behavior: Behavior normal.       Labs No results found for this or any previous visit (from the past 24 hour(s)).  Imaging US OB Transvaginal  Result Date: 11/02/2021 CLINICAL DATA:  Vaginal bleeding EXAM: TRANSVAGINAL OB ULTRASOUND TECHNIQUE: Transvaginal ultrasound was performed for complete evaluation of the gestation as well as the maternal uterus, adnexal regions, and pelvic cul-de-sac. COMPARISON:  None Available. FINDINGS: Intrauterine gestational sac: Single Yolk sac:  Seen, appears larger than usual measuring 6.4 mm. Embryo:  Seen Cardiac Activity: Not seen CRL:   3.4 mm   6 w 1 d                  [place] EDC: 06/27/2022 Subchorionic hemorrhage:  None visualized. Maternal uterus/adnexae: There is possible trace amount of fluid in cervical canal. IMPRESSION: There is intrauterine gestational sac containing yolk sac and fetal pole. Yolk sac appears larger than usual measuring 6.4 mm in diameter. There is no demonstrable fetal cardiac activity. Findings may suggest very early normal IUP or failed gestation  with incomplete abortion. Serial HCG estimations and short-term follow-up sonogram as clinically warranted should be considered. Trace amount of fluid is seen in cervical canal, possibly blood. No dominant adnexal masses are seen. There is no free fluid in pelvic cavity. Electronically Signed   By: Ernie Avena M.D.   On: 11/02/2021 16:26   US OB LESS THAN 14 WEEKS WITH OB TRANSVAGINAL  Result Date: 11/01/2021 CLINICAL DATA:  Bleeding and cramping. EXAM: OBSTETRIC <14 WK [place] AND TRANSVAGINAL OB US TECHNIQUE: Both transabdominal and transvaginal ultrasound examinations were performed for complete evaluation of the gestation as well as the maternal uterus, adnexal regions, and pelvic cul-de-sac. Transvaginal technique was performed to assess early pregnancy. COMPARISON:  None Available. FINDINGS: Intrauterine gestational sac: Single Yolk sac:  Yes Embryo:  Yes Cardiac Activity: No Heart Rate: None. CRL:  3.3 mm   6 w   sero d                  [place] EDC: 06/27/2022 Subchorionic hemorrhage:  Small subchorionic hemorrhage. Maternal uterus/adnexae: A thick walled cyst is noted in the right ovary suggesting corpus luteal cyst. The left ovary is within normal limits. Trace amount of free fluid in the pelvis. IMPRESSION: Single intrauterine pregnancy containing a yolk sac and fetal pole. No fetal cardiac activity or heart rate at this time. Differential diagnosis includes failed early pregnancy versus early pregnancy. Short-term follow-up ultrasound is recommended. Electronically Signed   By: Thornell Sartorius M.D.   On: 11/01/2021 23:29    MAU Course  Procedures Lab Orders  No laboratory test(s) ordered today   No orders of the defined types were placed in this encounter.  Imaging Orders         US OB Transvaginal     MDM Patient had ultrasound yesterday that shows IUP & subchorionic hemorrhage. Fetal pole seen measuring 3 mm with no cardiac activity.  Too small for BSUS. Repeat U/s ordered & comparable to  yesterday. She is RH positive.  Assessment and Plan   1. Vaginal bleeding in pregnancy, first trimester  -Reviewed bleeding precautions -Pelvic rest -RH positive  2. Subchorionic hematoma in first trimester, single or unspecified fetus   3. Pregnancy with uncertain fetal viability, single or unspecified fetus  -Scheduled for viability scan later this month  4. [redacted] weeks gestation of pregnancy      Judeth Horn, NP 11/02/21 9:41 PM

## 2021-11-03 ENCOUNTER — Encounter (HOSPITAL_COMMUNITY): Payer: Self-pay | Admitting: Obstetrics and Gynecology

## 2021-11-03 ENCOUNTER — Inpatient Hospital Stay (HOSPITAL_COMMUNITY): Payer: Medicaid Other

## 2021-11-03 ENCOUNTER — Inpatient Hospital Stay (HOSPITAL_COMMUNITY)
Admission: AD | Admit: 2021-11-03 | Discharge: 2021-11-03 | Disposition: A | Discharge status: Home / Self Care | Payer: Medicaid Other | Attending: Obstetrics and Gynecology | Admitting: Obstetrics and Gynecology

## 2021-11-03 DIAGNOSIS — O039 Complete or unspecified spontaneous abortion without complication: Secondary | ICD-10-CM

## 2021-11-03 DIAGNOSIS — O034 Incomplete spontaneous abortion without complication: Secondary | ICD-10-CM | POA: Diagnosis present

## 2021-11-03 DIAGNOSIS — N8311 Corpus luteum cyst of right ovary: Secondary | ICD-10-CM | POA: Insufficient documentation

## 2021-11-03 LAB — CBC
HCT: 37.4 % (ref 36.0–46.0)
Hemoglobin: 12.4 g/dL (ref 12.0–15.0)
MCH: 26.6 pg (ref 26.0–34.0)
MCHC: 33.2 g/dL (ref 30.0–36.0)
MCV: 80.1 fL (ref 80.0–100.0)
Platelets: 421 10*3/uL — ABNORMAL HIGH (ref 150–400)
RBC: 4.67 MIL/uL (ref 3.87–5.11)
RDW: 14.5 % (ref 11.5–15.5)
WBC: 10.1 10*3/uL (ref 4.0–10.5)
nRBC: 0 % (ref 0.0–0.2)

## 2021-11-03 LAB — HCG, QUANTITATIVE, PREGNANCY: hCG, Beta Chain, Quant, S: 5171 m[IU]/mL — ABNORMAL HIGH (ref <5)

## 2021-11-03 MED ORDER — HYDROCODONE-ACETAMINOPHEN 5-325 MG PO TABS: 2.0000 | ORAL_TABLET | ORAL | 0 refills | Status: AC | PRN

## 2021-11-03 MED ORDER — PROMETHAZINE HCL 12.5 MG PO TABS: 12.5000 mg | ORAL_TABLET | Freq: Four times a day (QID) | ORAL | 0 refills | Status: DC | PRN

## 2021-11-03 MED ORDER — LACTATED RINGERS IV BOLUS
1000.0000 mL | Freq: Once | INTRAVENOUS | Status: AC
  Administered 2021-11-03: 1000 mL via INTRAVENOUS

## 2021-11-03 MED ORDER — IBUPROFEN 600 MG PO TABS: 600.0000 mg | ORAL_TABLET | Freq: Four times a day (QID) | ORAL | 0 refills | Status: DC | PRN

## 2021-11-03 MED ORDER — MISOPROSTOL 200 MCG PO TABS
800.0000 ug | ORAL_TABLET | Freq: Once | ORAL | Status: AC
Start: 2021-11-03 — End: 2021-11-03
  Administered 2021-11-03: 800 ug via ORAL
  Filled 2021-11-03: qty 4

## 2021-11-03 NOTE — MAU Note (Signed)
.  Deborah Lawson is a 31 y.o. at 108w2d here in MAU reporting: via EMS reporting heavy VB that has continued since yesterday when she was seen in MAU. She is changed pads 2 times in one hour. She also reports lower abdominal cramping and lower back pain (9/10). Has not taken any meds for her pain.   Onset of complaint: On-going Pain score: 9/10 Vitals:   11/03/21 1123 11/03/21 1124  BP: (!) 147/89   Pulse: (!) 101   Resp: 20   Temp:  98.1 F (36.7 C)  SpO2: 100%      FHT:N/A

## 2021-11-03 NOTE — MAU Provider Note (Signed)
History     CSN: [id number]  Arrival date and time: 11/03/21 1120   Event Date/Time   First Provider Initiated Contact with Patient 11/03/21 1259      Chief Complaint  Patient presents with   Abdominal Pain   Vaginal Bleeding   HPI Deborah Lawson is a 31 y.o. G4P3003 at [redacted]w[redacted]d by LMP who presents to MAU via EMS with chief complaint of heavy vaginal bleeding. This is a recurrent problem for which patient was evaluated in MAU yesterday. She states her bleeding has intensified since that evaluation and she saturated 3 pads in one hour just before calling the ambulance. She also reports recurrent abdominal pain, worsening over time. She declines pain medication on arrival to MAU.  OB History     Gravida  4   Para  3   Term  3   Preterm  0   AB  0   Living  3      SAB  0   IAB  0   Ectopic  0   Multiple  0   Live Births  3           Past Medical History:  Diagnosis Date   Gestational diabetes    Gestational hypertension     Past Surgical History:  Procedure Laterality Date   CESAREAN SECTION N/A 10/26/2019   Procedure: CESAREAN SECTION;  Surgeon: Reva Bores, MD;  Location: [facility];  Service: Obstetrics;  Laterality: N/A;   MANDIBLE SURGERY      Family History  Problem Relation Age of Onset   Hypertension Father     Social History   Tobacco Use   Smoking status: Never   Smokeless tobacco: Never  Vaping Use   Vaping Use: Never used  Substance Use Topics   Alcohol use: Never   Drug use: Never    Allergies: No Known Allergies  Medications Prior to Admission  Medication Sig Dispense Refill Last Dose   albuterol (VENTOLIN HFA) 108 (90 Base) MCG/ACT inhaler Inhale 1-2 puffs into the lungs every 6 (six) hours as needed for wheezing or shortness of breath. 8 g 0    cetirizine (ZYRTEC ALLERGY) 10 MG tablet Take 1 tablet (10 mg total) by mouth daily. 30 tablet 1    fluticasone (FLONASE) 50 MCG/ACT nasal spray Place 1 spray into both  nostrils daily. 16 g 0    Iron, Ferrous Sulfate, 325 (65 Fe) MG TABS Take 325 mg by mouth daily. 30 tablet     Prenatal Vit-Fe Fumarate-FA (PRENATAL COMPLETE) 14-0.4 MG TABS Take 1 tablet/day. 60 tablet 1    Vitamin D, Ergocalciferol, (DRISDOL) 1.25 MG (50000 UNIT) CAPS capsule Take 1 capsule (50,000 Units total) by mouth every 7 (seven) days for 12 doses. 12 capsule 0     Review of Systems  Gastrointestinal:  Positive for abdominal pain.  Genitourinary:  Positive for vaginal bleeding.  All other systems reviewed and are negative.  Physical Exam   Blood pressure 130/68, pulse 97, temperature 98.1 F (36.7 C), temperature source Oral, resp. rate 20, height 5\' 1"  (1.549 m), last menstrual period 09/28/2021, SpO2 100 %.  Physical Exam Vitals and nursing note reviewed. Exam conducted with a chaperone present.  Constitutional:      Appearance: She is well-developed.  Cardiovascular:     Rate and Rhythm: Normal rate.  Abdominal:     Tenderness: There is no abdominal tenderness.  Genitourinary:    Comments: Scant to small bleeding on pad  worn on arrival to MAU Skin:    Capillary Refill: Capillary refill takes less than 2 seconds.  Neurological:     Mental Status: She is alert and oriented to person, place, and time.     MAU Course  Procedures  MDM  --EMR reviewed. Fetal pole without cardiac activity on formal US 11/01/2021 --Quant hCG [id number] 11/01/21  Orders Placed This Encounter  Procedures   US OB Transvaginal   CBC   hCG, quantitative, pregnancy   Vital signs   Insert peripheral IV   Discharge patient   Patient Vitals for the past 24 hrs:  BP Temp Temp src Pulse Resp SpO2 Height  11/03/21 1315 130/68 -- -- 97 -- 100 % --  11/03/21 1250 129/69 -- -- 89 -- 100 % --  11/03/21 1200 128/76 -- -- 93 -- 99 % --  11/03/21 1145 131/77 -- -- 97 -- 100 % --  11/03/21 1130 125/85 -- -- 96 -- 100 % --  11/03/21 1124 -- 98.1 F (36.7 C) Oral -- -- -- --  11/03/21 1123 (!)  147/89 -- -- (!) 101 20 100 % 5\' 1"  (1.549 m)   Results for orders placed or performed during the hospital encounter of 11/03/21 (from the past 24 hour(s))  CBC     Status: Abnormal   Collection Time: 11/03/21 11:54 AM  Result Value Ref Range   WBC 10.1 4.0 - 10.5 K/uL   RBC 4.67 3.87 - 5.11 MIL/uL   Hemoglobin 12.4 12.0 - 15.0 g/dL   HCT 37.4 36.0 - 46.0 %   MCV 80.1 80.0 - 100.0 fL   MCH 26.6 26.0 - 34.0 pg   MCHC 33.2 30.0 - 36.0 g/dL   RDW 14.5 11.5 - 15.5 %   Platelets 421 (H) 150 - 400 K/uL   nRBC 0.0 0.0 - 0.2 %  hCG, quantitative, pregnancy     Status: Abnormal   Collection Time: 11/03/21 11:54 AM  Result Value Ref Range   hCG, Beta Chain, Quant, S 5,171 (H) <5 mIU/mL   US OB Transvaginal  Result Date: 11/03/2021 CLINICAL DATA:  Vaginal bleeding EXAM: OBSTETRIC <14 WK ULTRASOUND TECHNIQUE: Transabdominal ultrasound was performed for evaluation of the gestation as well as the maternal uterus and adnexal regions. COMPARISON:  Ob ultrasound dated November 02, 2021 FINDINGS: Intrauterine gestational sac: None Yolk sac:  Not Visualized. Embryo:  Not Visualized. Cardiac Activity: Not Visualized. Subchorionic hemorrhage:  None visualized. Maternal uterus/adnexae: Normal appearance of the bilateral ovaries with right-sided corpus luteum cyst. Thickened and heterogeneous endometrium measuring up to 14 mm, no evidence of vascularity on color Doppler imaging. No free fluid seen in the pelvis. IMPRESSION: 1. No intrauterine gestational sac, yolk sac, or fetal pole identified, yolk sac and fetal pole visualized on prior exam. Findings are compatible with pregnancy failure. 2. Thickened and heterogeneous endometrium, concerning for blood products. Electronically Signed   By: Yetta Glassman M.D.   On: 11/03/2021 12:43    Early Intrauterine Pregnancy Failure  ___  Documented intrauterine pregnancy failure less than or equal to [redacted] weeks gestation  ___  No serious current illness  ___  Baseline  Hgb greater than or equal to 10g/dl  ___  Patient has easily accessible transportation to the hospital  ___  Clear preference  ___  Practitioner/physician deems patient reliable  ___  Counseling by practitioner or physician  ___  Patient education by RN  ___  Medication dispensed: Cytotec 800 mcg (PO in MAU)  ___  Ibuprofen 600 mg 1 tablet by mouth every 6 hours as needed #30  ___  Hydrocodone/acetaminophen 5/325 mg by mouth every 4 to 6 hours as needed  ___  Phenergan 12.5 mg by mouth every 4 hours as needed for nausea    Meds ordered this encounter  Medications   lactated ringers bolus 1,000 mL   misoprostol (CYTOTEC) tablet 800 mcg   ibuprofen (ADVIL) 600 MG tablet    Sig: Take 1 tablet (600 mg total) by mouth every 6 (six) hours as needed.    Dispense:  120 tablet    Refill:  0    Order Specific Question:   Supervising Provider    Answer:   Fabio Bering   promethazine (PHENERGAN) 12.5 MG tablet    Sig: Take 1 tablet (12.5 mg total) by mouth every 6 (six) hours as needed for nausea or vomiting.    Dispense:  30 tablet    Refill:  0    Order Specific Question:   Supervising Provider    Answer:   Fabio Bering   HYDROcodone-acetaminophen (NORCO/VICODIN) 5-325 MG tablet    Sig: Take 2 tablets by mouth every 4 (four) hours as needed for up to 2 days.    Dispense:  6 tablet    Refill:  0    Order Specific Question:   Supervising Provider    Answer:   Milas Hock [[id number]]   Assessment and Plan  --31 y.o. [phone] with complete miscarriage --Cytotec for retained POC per patient preference --Hgb 12.4, Blood type O POS --Language barrier: Remote Arabic interpreter utilized for all CNM-patient interaction --Discharge home in stable condition with bleeding precautions  F/U: --High alert message sent to [facility] to schedule miscarriage follow-up in one week  Calvert Cantor, MSA, MSN, CNM 11/03/2021, 6:24 PM

## 2021-11-09 ENCOUNTER — Encounter: Payer: Self-pay | Admitting: Internal Medicine

## 2021-11-09 ENCOUNTER — Other Ambulatory Visit: Payer: Self-pay | Admitting: *Deleted

## 2021-11-09 ENCOUNTER — Ambulatory Visit (INDEPENDENT_AMBULATORY_CARE_PROVIDER_SITE_OTHER): Payer: Medicaid Other | Admitting: Internal Medicine

## 2021-11-09 VITALS — BP 120/80 | HR 88 | Temp 98.2°F | Ht 64.5 in | Wt 199.7 lb

## 2021-11-09 DIAGNOSIS — Z0000 Encounter for general adult medical examination without abnormal findings: Secondary | ICD-10-CM

## 2021-11-09 DIAGNOSIS — E559 Vitamin D deficiency, unspecified: Secondary | ICD-10-CM

## 2021-11-09 DIAGNOSIS — Z23 Encounter for immunization: Secondary | ICD-10-CM | POA: Diagnosis not present

## 2021-11-09 LAB — COMPREHENSIVE METABOLIC PANEL
ALT: 33 U/L (ref 0–35)
AST: 21 U/L (ref 0–37)
Albumin: 4 g/dL (ref 3.5–5.2)
Alkaline Phosphatase: 78 U/L (ref 39–117)
BUN: 11 mg/dL (ref 6–23)
CO2: 27 mEq/L (ref 19–32)
Calcium: 9.2 mg/dL (ref 8.4–10.5)
Chloride: 100 mEq/L (ref 96–112)
Creatinine, Ser: 0.5 mg/dL (ref 0.40–1.20)
GFR: 126.54 mL/min (ref >60.00)
Glucose, Bld: 97 mg/dL (ref 70–99)
Potassium: 4.1 mEq/L (ref 3.5–5.1)
Sodium: 137 mEq/L (ref 135–145)
Total Bilirubin: 0.2 mg/dL (ref 0.2–1.2)
Total Protein: 7.5 g/dL (ref 6.0–8.3)

## 2021-11-09 LAB — CBC WITH DIFFERENTIAL/PLATELET
Basophils Absolute: 0 10*3/uL (ref 0.0–0.1)
Basophils Relative: 0.3 % (ref 0.0–3.0)
Eosinophils Absolute: 0.1 10*3/uL (ref 0.0–0.7)
Eosinophils Relative: 1.6 % (ref 0.0–5.0)
HCT: 36.4 % (ref 36.0–46.0)
Hemoglobin: 12.1 g/dL (ref 12.0–15.0)
Lymphocytes Relative: 50.3 % — ABNORMAL HIGH (ref 12.0–46.0)
Lymphs Abs: 3.6 10*3/uL (ref 0.7–4.0)
MCHC: 33.2 g/dL (ref 30.0–36.0)
MCV: 80.1 fl (ref 78.0–100.0)
Monocytes Absolute: 0.6 10*3/uL (ref 0.1–1.0)
Monocytes Relative: 8.7 % (ref 3.0–12.0)
Neutro Abs: 2.8 10*3/uL (ref 1.4–7.7)
Neutrophils Relative %: 39.1 % — ABNORMAL LOW (ref 43.0–77.0)
Platelets: 442 10*3/uL — ABNORMAL HIGH (ref 150.0–400.0)
RBC: 4.54 Mil/uL (ref 3.87–5.11)
RDW: 14.7 % (ref 11.5–15.5)
WBC: 7.1 10*3/uL (ref 4.0–10.5)

## 2021-11-09 LAB — TSH: TSH: 2.71 u[IU]/mL (ref 0.35–5.50)

## 2021-11-09 LAB — LIPID PANEL
Cholesterol: 166 mg/dL (ref 0–200)
HDL: 36.9 mg/dL — ABNORMAL LOW (ref >39.00)
LDL Cholesterol: 108 mg/dL — ABNORMAL HIGH (ref 0–99)
NonHDL: 129.38
Total CHOL/HDL Ratio: 5
Triglycerides: 107 mg/dL (ref 0.0–149.0)
VLDL: 21.4 mg/dL (ref 0.0–40.0)

## 2021-11-09 LAB — VITAMIN D 25 HYDROXY (VIT D DEFICIENCY, FRACTURES): VITD: 18.47 ng/mL — ABNORMAL LOW (ref 30.00–100.00)

## 2021-11-09 LAB — VITAMIN B12: Vitamin B-12: 436 pg/mL (ref 211–911)

## 2021-11-09 LAB — HEMOGLOBIN A1C: Hgb A1c MFr Bld: 5.9 % (ref 4.6–6.5)

## 2021-11-09 NOTE — Progress Notes (Signed)
Established Patient Office Visit     CC/Reason for Visit: Annual preventive exam  HPI: Deborah Lawson is a 31 y.o. female who is coming in today for the above mentioned reasons.  She has no past medical history of significance.  She unfortunately suffered her first trimester miscarriage last week.  She has routine dental care but is overdue for an eye exam.  She is due for flu vaccine.  She believes she had a bivalent COVID booster at pharmacy and will obtain records for [place].  She believes she had a Tdap during her previous pregnancy and will obtain records from her OB/GYN.  She has no acute concerns or complaints.  Visit is aided by an in person translator.   Past Medical/Surgical History: Past Medical History:  Diagnosis Date   Gestational diabetes    Gestational hypertension     Past Surgical History:  Procedure Laterality Date   CESAREAN SECTION N/A 10/26/2019   Procedure: CESAREAN SECTION;  Surgeon: Reva Bores, MD;  Location: [facility];  Service: Obstetrics;  Laterality: N/A;   MANDIBLE SURGERY      Social History:  reports that she has never smoked. She has never used smokeless tobacco. She reports that she does not drink alcohol and does not use drugs.  Allergies: No Known Allergies  Family History:  Family History  Problem Relation Age of Onset   Hypertension Father      Current Outpatient Medications:    albuterol (VENTOLIN HFA) 108 (90 Base) MCG/ACT inhaler, Inhale 1-2 puffs into the lungs every 6 (six) hours as needed for wheezing or shortness of breath., Disp: 8 g, Rfl: 0   cetirizine (ZYRTEC ALLERGY) 10 MG tablet, Take 1 tablet (10 mg total) by mouth daily., Disp: 30 tablet, Rfl: 1   fluticasone (FLONASE) 50 MCG/ACT nasal spray, Place 1 spray into both nostrils daily., Disp: 16 g, Rfl: 0   ibuprofen (ADVIL) 600 MG tablet, Take 1 tablet (600 mg total) by mouth every 6 (six) hours as needed., Disp: 120 tablet, Rfl: 0   Iron, Ferrous Sulfate, 325 (65  Fe) MG TABS, Take 325 mg by mouth daily., Disp: 30 tablet, Rfl:    Prenatal Vit-Fe Fumarate-FA (PRENATAL COMPLETE) 14-0.4 MG TABS, Take 1 tablet/day., Disp: 60 tablet, Rfl: 1   promethazine (PHENERGAN) 12.5 MG tablet, Take 1 tablet (12.5 mg total) by mouth every 6 (six) hours as needed for nausea or vomiting., Disp: 30 tablet, Rfl: 0   Vitamin D, Ergocalciferol, (DRISDOL) 1.25 MG (50000 UNIT) CAPS capsule, Take 1 capsule (50,000 Units total) by mouth every 7 (seven) days for 12 doses., Disp: 12 capsule, Rfl: 0  Review of Systems:  Constitutional: Denies fever, chills, diaphoresis, appetite change and fatigue.  HEENT: Denies photophobia, eye pain, redness, hearing loss, ear pain, congestion, sore throat, rhinorrhea, sneezing, mouth sores, trouble swallowing, neck pain, neck stiffness and tinnitus.   Respiratory: Denies SOB, DOE, cough, chest tightness,  and wheezing.   Cardiovascular: Denies chest pain, palpitations and leg swelling.  Gastrointestinal: Denies nausea, vomiting, abdominal pain, diarrhea, constipation, blood in stool and abdominal distention.  Genitourinary: Denies dysuria, urgency, frequency, hematuria, flank pain and difficulty urinating.  Endocrine: Denies: hot or cold intolerance, sweats, changes in hair or nails, polyuria, polydipsia. Musculoskeletal: Denies myalgias, back pain, joint swelling, arthralgias and gait problem.  Skin: Denies pallor, rash and wound.  Neurological: Denies dizziness, seizures, syncope, weakness, light-headedness, numbness and headaches.  Hematological: Denies adenopathy. Easy bruising, personal or family bleeding  history  Psychiatric/Behavioral: Denies suicidal ideation, mood changes, confusion, nervousness, sleep disturbance and agitation    Physical Exam: Vitals:   11/09/21 0732  BP: 120/80  Pulse: 88  Temp: 98.2 F (36.8 C)  TempSrc: Oral  SpO2: 100%  Weight: 199 lb 11.2 oz (90.6 kg)  Height: 5' 4.5" (1.638 m)    Body mass index is  33.75 kg/m.   Constitutional: NAD, calm, comfortable Eyes: PERRL, lids and conjunctivae normal ENMT: Mucous membranes are moist. Posterior pharynx clear of any exudate or lesions. Normal dentition. Tympanic membrane is pearly white, no erythema or bulging. Neck: normal, supple, no masses, no thyromegaly Respiratory: clear to auscultation bilaterally, no wheezing, no crackles. Normal respiratory effort. No accessory muscle use.  Cardiovascular: Regular rate and rhythm, no murmurs / rubs / gallops. No extremity edema. 2+ pedal pulses. No carotid bruits.  Abdomen: no tenderness, no masses palpated. No hepatosplenomegaly. Bowel sounds positive.  Musculoskeletal: no clubbing / cyanosis. No joint deformity upper and lower extremities. Good ROM, no contractures. Normal muscle tone.  Skin: no rashes, lesions, ulcers. No induration Neurologic: CN 2-12 grossly intact. Sensation intact, DTR normal. Strength 5/5 in all 4.  Psychiatric: Normal judgment and insight. Alert and oriented x 3. Normal mood.   Flowsheet Row Office Visit from 11/09/2021 in [facility] [place]  PHQ-9 Total Score 0         Impression and Plan:  Encounter for preventive health examination - Plan: CBC with Differential/Platelet, Comprehensive metabolic panel, Hemoglobin A1c, Lipid panel, TSH, Vitamin B12  Vitamin D deficiency - Plan: VITAMIN D 25 Hydroxy (Vit-D Deficiency, Fractures)  Need for influenza vaccination  -Recommend routine eye and dental care. -Immunizations: Flu vaccine in office today, she will get records of recent Tdap and COVID. -Healthy lifestyle discussed in detail. -Labs to be updated today. -Colon cancer screening: Commence age 29 -Breast cancer screening: Commence age 81 -Cervical cancer screening: Followed by OB/GYN -Lung cancer screening: Not applicable -Prostate cancer screening: Not applicable -DEXA: Not applicable   Patient Instructions  -Nice seeing you today!!  -Lab work  today; will notify you once results are available.  -Flu vaccine today.  -Schedule follow up in 1 year or sooner as needed.      Chaya Jan, MD [facility]

## 2021-11-09 NOTE — Patient Instructions (Signed)
-  Nice seeing you today!!  -Lab work today; will notify you once results are available.  -Flu vaccine today.  -Schedule follow up in 1 year or sooner as needed.   

## 2021-11-10 ENCOUNTER — Encounter: Payer: Self-pay | Admitting: Internal Medicine

## 2021-11-11 ENCOUNTER — Ambulatory Visit (INDEPENDENT_AMBULATORY_CARE_PROVIDER_SITE_OTHER): Payer: Medicaid Other | Admitting: Family Medicine

## 2021-11-11 ENCOUNTER — Encounter: Payer: Self-pay | Admitting: Family Medicine

## 2021-11-11 DIAGNOSIS — O039 Complete or unspecified spontaneous abortion without complication: Secondary | ICD-10-CM | POA: Diagnosis not present

## 2021-11-11 NOTE — Assessment & Plan Note (Addendum)
Reassured patient that miscarriage is common with ~1/4 of women experiencing it in their lifetime. Reassured patient that there is nothing she did or did not do to cause this. Reviewed most common reason is presumed to be genetic abnormalities that allow a pregnancy to start but not continue past an early stage, but realistically we do not know the cause in most cases. Reviewed that studies show no definite difference between attempting another pregnancy sooner vs waiting, though some studies do show better live birth outcomes with trying sooner. Medication management appears to have been effective. Blood type --/--/O POS (08/20 0706), rhogam was not indicated. Patient declines contraception.  Due for pap smear, prefers to wait until her appt with Dr. Para March on 11/25/2021. Also thinks she may have gotten it at [facility] two years ago, will send ROI, hopefully will be available prior to next appt.

## 2021-11-11 NOTE — Progress Notes (Signed)
GYNECOLOGY OFFICE VISIT NOTE  History:   Deborah Lawson is a 31 y.o. [phone] here today for SAB follow up.  Seen several times in [facility] last week IUP diagnosed but no fetal activity seen On 11/03/2021 presented with heavy bleeding and only blood products in uterus on [place] c/w SAB She was given cytotec prescription and d/c home Blood type O+  Today reports she took the cytotec Currently only having vaginal spotting and no pain Wondering why she had a miscarriage  Health Maintenance Due  Topic Date Due   HIV Screening  Never done   Hepatitis C Screening  Never done    Past Medical History:  Diagnosis Date   Gestational diabetes    Gestational hypertension     Past Surgical History:  Procedure Laterality Date   CESAREAN SECTION N/A 10/26/2019   Procedure: CESAREAN SECTION;  Surgeon: Reva Bores, MD;  Location: [facility];  Service: Obstetrics;  Laterality: N/A;   MANDIBLE SURGERY      The following portions of the patient's history were reviewed and updated as appropriate: allergies, current medications, past family history, past medical history, past social history, past surgical history and problem list.   Health Maintenance:   Last pap: No results found for: "DIAGPAP", "HPV", "HPVHIGH"  *needs*  Last mammogram:  N/a    Review of Systems:  Pertinent items noted in HPI and remainder of comprehensive ROS otherwise negative.  Physical Exam:  BP 104/75   Pulse (!) 105   Ht 5' 4.5" (1.638 m)   Wt 195 lb 14.4 oz (88.9 kg)   LMP 09/28/2021   BMI 33.11 kg/m  CONSTITUTIONAL: Well-developed, well-nourished female in no acute distress.  HEENT:  Normocephalic, atraumatic. External right and left ear normal. No scleral icterus.  NECK: Normal range of motion, supple, no masses noted on observation SKIN: No rash noted. Not diaphoretic. No erythema. No pallor. MUSCULOSKELETAL: Normal range of motion. No edema noted. NEUROLOGIC: Alert and oriented to person, place, and  time. Normal muscle tone coordination.  PSYCHIATRIC: Normal mood and affect. Normal behavior. Normal judgment and thought content. RESPIRATORY: Effort normal, no problems with respiration noted   Labs and Imaging Results for orders placed or performed in visit on 11/09/21 (from the past 168 hour(s))  VITAMIN D 25 Hydroxy (Vit-D Deficiency, Fractures)   Collection Time: 11/09/21  8:07 AM  Result Value Ref Range   VITD 18.47 (L) 30.00 - 100.00 ng/mL  Vitamin B12   Collection Time: 11/09/21  8:07 AM  Result Value Ref Range   Vitamin B-12 436 211 - 911 pg/mL  TSH   Collection Time: 11/09/21  8:07 AM  Result Value Ref Range   TSH 2.71 0.35 - 5.50 uIU/mL  Lipid panel   Collection Time: 11/09/21  8:07 AM  Result Value Ref Range   Cholesterol 166 0 - 200 mg/dL   Triglycerides 469.6 0.0 - 149.0 mg/dL   HDL 29.52 (L) >84.13 mg/dL   VLDL 24.4 0.0 - 01.0 mg/dL   LDL Cholesterol 272 (H) 0 - 99 mg/dL   Total CHOL/HDL Ratio 5    NonHDL 129.38   Hemoglobin A1c   Collection Time: 11/09/21  8:07 AM  Result Value Ref Range   Hgb A1c MFr Bld 5.9 4.6 - 6.5 %  Comprehensive metabolic panel   Collection Time: 11/09/21  8:07 AM  Result Value Ref Range   Sodium 137 135 - 145 mEq/L   Potassium 4.1 3.5 - 5.1 mEq/L  Chloride 100 96 - 112 mEq/L   CO2 27 19 - 32 mEq/L   Glucose, Bld 97 70 - 99 mg/dL   BUN 11 6 - 23 mg/dL   Creatinine, Ser 3.79 0.40 - 1.20 mg/dL   Total Bilirubin 0.2 0.2 - 1.2 mg/dL   Alkaline Phosphatase 78 39 - 117 U/L   AST 21 0 - 37 U/L   ALT 33 0 - 35 U/L   Total Protein 7.5 6.0 - 8.3 g/dL   Albumin 4.0 3.5 - 5.2 g/dL   GFR 024.09 >73.53 mL/min   Calcium 9.2 8.4 - 10.5 mg/dL  CBC with Differential/Platelet   Collection Time: 11/09/21  8:07 AM  Result Value Ref Range   WBC 7.1 4.0 - 10.5 K/uL   RBC 4.54 3.87 - 5.11 Mil/uL   Hemoglobin 12.1 12.0 - 15.0 g/dL   HCT 29.9 24.2 - 68.3 %   MCV 80.1 78.0 - 100.0 fl   MCHC 33.2 30.0 - 36.0 g/dL   RDW 41.9 62.2 - 29.7 %    Platelets 442.0 (H) 150.0 - 400.0 K/uL   Neutrophils Relative % 39.1 (L) 43.0 - 77.0 %   Lymphocytes Relative 50.3 (H) 12.0 - 46.0 %   Monocytes Relative 8.7 3.0 - 12.0 %   Eosinophils Relative 1.6 0.0 - 5.0 %   Basophils Relative 0.3 0.0 - 3.0 %   Neutro Abs 2.8 1.4 - 7.7 K/uL   Lymphs Abs 3.6 0.7 - 4.0 K/uL   Monocytes Absolute 0.6 0.1 - 1.0 K/uL   Eosinophils Absolute 0.1 0.0 - 0.7 K/uL   Basophils Absolute 0.0 0.0 - 0.1 K/uL   US OB Transvaginal  Result Date: 11/03/2021 CLINICAL DATA:  Vaginal bleeding EXAM: OBSTETRIC <14 WK ULTRASOUND TECHNIQUE: Transabdominal ultrasound was performed for evaluation of the gestation as well as the maternal uterus and adnexal regions. COMPARISON:  Ob ultrasound dated November 02, 2021 FINDINGS: Intrauterine gestational sac: None Yolk sac:  Not Visualized. Embryo:  Not Visualized. Cardiac Activity: Not Visualized. Subchorionic hemorrhage:  None visualized. Maternal uterus/adnexae: Normal appearance of the bilateral ovaries with right-sided corpus luteum cyst. Thickened and heterogeneous endometrium measuring up to 14 mm, no evidence of vascularity on color Doppler imaging. No free fluid seen in the pelvis. IMPRESSION: 1. No intrauterine gestational sac, yolk sac, or fetal pole identified, yolk sac and fetal pole visualized on prior exam. Findings are compatible with pregnancy failure. 2. Thickened and heterogeneous endometrium, concerning for blood products. Electronically Signed   By: Allegra Lai M.D.   On: 11/03/2021 12:43   US OB Transvaginal  Result Date: 11/02/2021 CLINICAL DATA:  Vaginal bleeding EXAM: TRANSVAGINAL OB ULTRASOUND TECHNIQUE: Transvaginal ultrasound was performed for complete evaluation of the gestation as well as the maternal uterus, adnexal regions, and pelvic cul-de-sac. COMPARISON:  None Available. FINDINGS: Intrauterine gestational sac: Single Yolk sac:  Seen, appears larger than usual measuring 6.4 mm. Embryo:  Seen Cardiac Activity:  Not seen CRL:   3.4 mm   6 w 1 d                  [place] EDC: 06/27/2022 Subchorionic hemorrhage:  None visualized. Maternal uterus/adnexae: There is possible trace amount of fluid in cervical canal. IMPRESSION: There is intrauterine gestational sac containing yolk sac and fetal pole. Yolk sac appears larger than usual measuring 6.4 mm in diameter. There is no demonstrable fetal cardiac activity. Findings may suggest very early normal IUP or failed gestation with incomplete abortion. Serial HCG estimations and  short-term follow-up sonogram as clinically warranted should be considered. Trace amount of fluid is seen in cervical canal, possibly blood. No dominant adnexal masses are seen. There is no free fluid in pelvic cavity. Electronically Signed   By: Ernie Avena M.D.   On: 11/02/2021 16:26   US OB LESS THAN 14 WEEKS WITH OB TRANSVAGINAL  Result Date: 11/01/2021 CLINICAL DATA:  Bleeding and cramping. EXAM: OBSTETRIC <14 WK [place] AND TRANSVAGINAL OB US TECHNIQUE: Both transabdominal and transvaginal ultrasound examinations were performed for complete evaluation of the gestation as well as the maternal uterus, adnexal regions, and pelvic cul-de-sac. Transvaginal technique was performed to assess early pregnancy. COMPARISON:  None Available. FINDINGS: Intrauterine gestational sac: Single Yolk sac:  Yes Embryo:  Yes Cardiac Activity: No Heart Rate: None. CRL:  3.3 mm   6 w   sero d                  [place] EDC: 06/27/2022 Subchorionic hemorrhage:  Small subchorionic hemorrhage. Maternal uterus/adnexae: A thick walled cyst is noted in the right ovary suggesting corpus luteal cyst. The left ovary is within normal limits. Trace amount of free fluid in the pelvis. IMPRESSION: Single intrauterine pregnancy containing a yolk sac and fetal pole. No fetal cardiac activity or heart rate at this time. Differential diagnosis includes failed early pregnancy versus early pregnancy. Short-term follow-up ultrasound is recommended.  Electronically Signed   By: Thornell Sartorius M.D.   On: 11/01/2021 23:29      Assessment and Plan:   Problem List Items Addressed This Visit       Other   Miscarriage    Reassured patient that miscarriage is common with ~1/4 of women experiencing it in their lifetime. Reassured patient that there is nothing she did or did not do to cause this. Reviewed most common reason is presumed to be genetic abnormalities that allow a pregnancy to start but not continue past an early stage, but realistically we do not know the cause in most cases. Reviewed that studies show no definite difference between attempting another pregnancy sooner vs waiting, though some studies do show better live birth outcomes with trying sooner. Medication management appears to have been effective. Blood type --/--/O POS (08/20 0706), rhogam was not indicated. Patient declines contraception.  Due for pap smear, prefers to wait until her appt with Dr. Para March on 11/25/2021. Also thinks she may have gotten it at [facility] two years ago, will send ROI, hopefully will be available prior to next appt.       Video interpreter Mady Haagensen [phone] used throughout encounter  Routine preventative health maintenance measures emphasized. Please refer to After Visit Summary for other counseling recommendations.   Return in about 2 weeks (around 11/25/2021) for follow up visit w Dr. Para March.    Total face-to-face time with patient: 20 minutes.  Over 50% of encounter was spent on counseling and coordination of care.   Venora Maples, MD/MPH Attending Family Medicine Physician, [facility] for [facility], [facility]

## 2021-11-11 NOTE — Progress Notes (Signed)
Here for miscarriage follow up.States took cytotec at hospital and bled a lot for 4-5 days. States spotting only. Denies pain.  C/o nausea and dizziness.  [name]

## 2021-11-12 ENCOUNTER — Other Ambulatory Visit: Payer: Medicaid Other

## 2021-11-22 NOTE — Progress Notes (Signed)
Pt was not due for pap smear (confirmed pap at [facility]) and declined exam. Changed to nurse visit.

## 2021-11-25 ENCOUNTER — Other Ambulatory Visit: Payer: Self-pay

## 2021-11-25 ENCOUNTER — Encounter: Payer: Self-pay | Admitting: Obstetrics and Gynecology

## 2021-11-25 ENCOUNTER — Ambulatory Visit (INDEPENDENT_AMBULATORY_CARE_PROVIDER_SITE_OTHER): Payer: Medicaid Other | Admitting: General Practice

## 2021-11-25 VITALS — BP 139/89 | HR 102 | Ht 64.0 in | Wt 200.0 lb

## 2021-11-25 DIAGNOSIS — Z01419 Encounter for gynecological examination (general) (routine) without abnormal findings: Secondary | ICD-10-CM

## 2021-11-25 NOTE — Progress Notes (Signed)
Patient was scheduled in office today for pap smear but I called [facility] and confirmed patient had normal pap 04/2019. Discussed with patient. She had questions about what to expect with her next period. Discussed she should expect a period in the next 4-6 weeks, it may not necessarily come on the same date as previous periods, & if she doesn't get a period in the next 4-6 weeks, please call the office and let us know. Discussed with patient sometimes the first period after can be a little heavier. Recommended she continue PNV if she is trying to get pregnant again soon. Discussed with patient to call the office to schedule a pregnancy test if she becomes pregnant in the future. Patient verbalized understanding to all.   Chase Caller RN BSN 11/25/21

## 2021-11-27 ENCOUNTER — Encounter (HOSPITAL_COMMUNITY): Payer: Self-pay

## 2021-11-27 ENCOUNTER — Ambulatory Visit (HOSPITAL_COMMUNITY)
Admission: EM | Admit: 2021-11-27 | Discharge: 2021-11-27 | Disposition: A | Discharge status: Home / Self Care | Payer: Medicaid Other | Attending: Family Medicine | Admitting: Family Medicine

## 2021-11-27 DIAGNOSIS — H8110 Benign paroxysmal vertigo, unspecified ear: Secondary | ICD-10-CM | POA: Diagnosis not present

## 2021-11-27 DIAGNOSIS — Z3202 Encounter for pregnancy test, result negative: Secondary | ICD-10-CM

## 2021-11-27 LAB — POC URINE PREG, ED: Preg Test, Ur: NEGATIVE

## 2021-11-27 MED ORDER — MECLIZINE HCL 25 MG PO TABS
25.0000 mg | ORAL_TABLET | Freq: Three times a day (TID) | ORAL | 0 refills | Status: DC | PRN
Start: 2021-11-27 — End: 2022-06-29

## 2021-11-27 NOTE — Discharge Instructions (Addendum)
The pregnancy test was negative  Take meclizine 25 mg--1 tablet every 8 hours as needed for vertigo

## 2021-11-27 NOTE — ED Provider Notes (Signed)
[facility]    CSN: [id number] Arrival date & time: 11/27/21  1148      History   Chief Complaint Chief Complaint  Patient presents with   Nausea   Headache    HPI Deborah Lawson is a 31 y.o. female.    Headache  Here with a 2 to 3-day history of some vertigo type dizziness and nausea.  She has had some upper abdominal pain also and some ear pain.  No fever or chills.  No dysuria.  Last menstrual cycle was mid July, and she has since had a miscarriage.  She followed up with GYN about 2 days ago.  Past Medical History:  Diagnosis Date   Gestational diabetes    Gestational hypertension     Patient Active Problem List   Diagnosis Date Noted   Miscarriage 11/11/2021   White matter abnormality on MRI of brain 07/06/2021   Abnormal computed tomography of paranasal sinus 07/06/2021   Vitamin D deficiency 11/11/2020   Iron deficiency 11/11/2020   Gestational diabetes    Gestational hypertension    Language barrier 10/12/2019    Past Surgical History:  Procedure Laterality Date   CESAREAN SECTION N/A 10/26/2019   Procedure: CESAREAN SECTION;  Surgeon: Reva Bores, MD;  Location: [facility];  Service: Obstetrics;  Laterality: N/A;   MANDIBLE SURGERY      OB History     Gravida  4   Para  3   Term  3   Preterm  0   AB  0   Living  3      SAB  0   IAB  0   Ectopic  0   Multiple  0   Live Births  3            Home Medications    Prior to Admission medications   Medication Sig Start Date End Date Taking? Authorizing Provider  meclizine (ANTIVERT) 25 MG tablet Take 1 tablet (25 mg total) by mouth 3 (three) times daily as needed for dizziness (vertigo). 11/27/21  Yes Dezyre Hoefer, Janace Aris, MD  Vitamin D, Ergocalciferol, (DRISDOL) 1.25 MG (50000 UNIT) CAPS capsule Take 1 capsule (50,000 Units total) by mouth every 7 (seven) days for 12 doses. 10/01/21 12/18/21 Yes Henderson Cloud, MD  NIFEdipine (PROCARDIA XL) 30 MG 24 hr  tablet Take 1 tablet (30 mg total) by mouth daily. 11/05/19 02/18/20  Donette Larry, CNM    Family History Family History  Problem Relation Age of Onset   Hypertension Father     Social History Social History   Tobacco Use   Smoking status: Never    Passive exposure: Never   Smokeless tobacco: Never  Vaping Use   Vaping Use: Never used  Substance Use Topics   Alcohol use: Never   Drug use: Never     Allergies   Patient has no known allergies.   Review of Systems Review of Systems  Neurological:  Positive for headaches.     Physical Exam Triage Vital Signs ED Triage Vitals  Enc Vitals Group     BP 11/27/21 1324 113/82     Pulse Rate 11/27/21 1324 95     Resp 11/27/21 1324 16     Temp 11/27/21 1324 98.5 F (36.9 C)     Temp Source 11/27/21 1324 Oral     SpO2 11/27/21 1324 100 %     Weight --      Height --  Head Circumference --      Peak Flow --      Pain Score 11/27/21 1328 9     Pain Loc --      Pain Edu? --      Excl. in GC? --    No data found.  Updated Vital Signs BP 113/82 (BP Location: Left Arm)   Pulse 95   Temp 98.5 F (36.9 C) (Oral)   Resp 16   LMP 09/28/2021 (Exact Date)   SpO2 100%   Breastfeeding No   Visual Acuity Right Eye Distance:   Left Eye Distance:   Bilateral Distance:    Right Eye Near:   Left Eye Near:    Bilateral Near:     Physical Exam Vitals reviewed.  Constitutional:      General: She is not in acute distress.    Appearance: She is not ill-appearing, toxic-appearing or diaphoretic.  HENT:     Right Ear: Tympanic membrane and ear canal normal.     Left Ear: Tympanic membrane and ear canal normal.     Nose: Nose normal.     Mouth/Throat:     Mouth: Mucous membranes are moist.     Pharynx: No oropharyngeal exudate or posterior oropharyngeal erythema.  Eyes:     Extraocular Movements: Extraocular movements intact.     Conjunctiva/sclera: Conjunctivae normal.     Pupils: Pupils are equal, round, and  reactive to light.  Cardiovascular:     Rate and Rhythm: Normal rate and regular rhythm.     Heart sounds: No murmur heard. Pulmonary:     Effort: Pulmonary effort is normal.     Breath sounds: Normal breath sounds. No stridor. No wheezing, rhonchi or rales.  Abdominal:     General: There is no distension.     Palpations: Abdomen is soft.     Tenderness: There is no abdominal tenderness.  Musculoskeletal:     Cervical back: Neck supple. No tenderness.  Lymphadenopathy:     Cervical: No cervical adenopathy.  Skin:    Coloration: Skin is not jaundiced or pale.  Neurological:     General: No focal deficit present.     Mental Status: She is alert and oriented to person, place, and time.  Psychiatric:        Behavior: Behavior normal.      UC Treatments / Results  Labs (all labs ordered are listed, but only abnormal results are displayed) Labs Reviewed  POC URINE PREG, ED    EKG   Radiology No results found.  Procedures Procedures (including critical care time)  Medications Ordered in UC Medications - No data to display  Initial Impression / Assessment and Plan / UC Course  I have reviewed the triage vital signs and the nursing notes.  Pertinent labs & imaging results that were available during my care of the patient were reviewed by me and considered in my medical decision making (see chart for details).        UPT is negative  She is prescribed meclizine and instructions are given in English and in Arabic for how to do the Epley maneuver. Final Clinical Impressions(s) / UC Diagnoses   Final diagnoses:  Benign paroxysmal positional vertigo, unspecified laterality     Discharge Instructions      The pregnancy test was negative  Take meclizine 25 mg--1 tablet every 8 hours as needed for vertigo      ED Prescriptions     Medication Sig Dispense Auth. Provider  meclizine (ANTIVERT) 25 MG tablet Take 1 tablet (25 mg total) by mouth 3 (three)  times daily as needed for dizziness (vertigo). 30 tablet Carolynn Tuley, Gwenlyn Perking, MD      PDMP not reviewed this encounter.   Barrett Henle, MD 11/27/21 1415

## 2021-11-27 NOTE — ED Triage Notes (Signed)
Patient having headache nausea and dizziness, onset 3 days.  No known sick exposure, no one at home with the same symptoms. Patient has not thrown up. No new medication or diet changes.

## 2021-12-05 ENCOUNTER — Ambulatory Visit (HOSPITAL_COMMUNITY): Payer: Medicaid Other

## 2021-12-10 NOTE — Progress Notes (Unsigned)
Name: Deborah Lawson  MRN/ DOB: [id number], 1991-03-04    Age/ Sex: 31 y.o., female    PCP: Philip Aspen, Limmie Patricia, MD   Reason for Endocrinology Evaluation: Pituitary cyst     Date of Initial Endocrinology Evaluation: 08/05/2020    HPI: [name] is a 31 y.o. female with unremarkable past medical history . The patient presented for initial endocrinology clinic visit on 08/05/2020 for consultative assistance with her pituitary cyst.   During evaluation of left sided facial pain through neurology she was found to have a 3 mm pituitary cyst during MRI in 06/2020 Pituitary work-up has been normal 07/2020   Pt noted atypical left facial pain and migraine headaches since having the nexplanon . Historically she has had occasional headaches but these became worse associated with left visual changes .  Her headaches have improved since removing the Nexplanon 5/11th 2022  She is S/P C-section in 10/2019 ,this was her 3rd pregnancy.     SUBJECTIVE:     Today (12/11/21): 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  She presented to the ED 9/15 for nausea and headaches and dizziness, was prescribed Meclizine  which has helped  She had a miscarriage 10/2021 She is accompanied by her toddler daughter  Constipation resolved Denies increase size of rings and shoes  Denies mastalgia  LMP 12/05/2021  Has a left gum pain, has a dental appointment next week  Had a head injury, head hurting , She also has dandruff  was using shampoo         HISTORY:  Past Medical History:  Past Medical History:  Diagnosis Date   Gestational diabetes    Gestational hypertension    Past Surgical History:  Past Surgical History:  Procedure Laterality Date   CESAREAN SECTION N/A 10/26/2019   Procedure: CESAREAN SECTION;  Surgeon: Reva Bores, MD;  Location: [facility];  Service: Obstetrics;  Laterality: N/A;   MANDIBLE SURGERY      Social History:  reports that  she has never smoked. She has never been exposed to tobacco smoke. She has never used smokeless tobacco. She reports that she does not drink alcohol and does not use drugs. Family History: family history includes Hypertension in her father.   HOME MEDICATIONS: Allergies as of 12/11/2021   No Known Allergies      Medication List        Accurate as of December 11, 2021  8:19 AM. If you have any questions, ask your nurse or doctor.          ketoconazole 2 % shampoo Commonly known as: Nizoral Apply 1 Application topically 2 (two) times a week. Start taking on: December 14, 2021 Started by: Scarlette Shorts, MD   meclizine 25 MG tablet Commonly known as: ANTIVERT Take 1 tablet (25 mg total) by mouth 3 (three) times daily as needed for dizziness (vertigo).   Vitamin D (Ergocalciferol) 1.25 MG (50000 UNIT) Caps capsule Commonly known as: DRISDOL Take 1 capsule (50,000 Units total) by mouth every 7 (seven) days for 12 doses.          REVIEW OF SYSTEMS: A comprehensive ROS was conducted with the patient and is negative except as per HPI    OBJECTIVE:  VS: BP 130/74 (BP Location: Left Arm, Patient Position: Sitting, Cuff Size: Large)   Pulse 96   Ht 5\' 4"  (1.626 m)   Wt 201 lb 9.6 oz (91.4 kg)   LMP  09/28/2021 (Exact Date)   SpO2 99%   BMI 34.60 kg/m    Wt Readings from Last 3 Encounters:  12/11/21 201 lb 9.6 oz (91.4 kg)  11/25/21 200 lb (90.7 kg)  11/11/21 195 lb 14.4 oz (88.9 kg)     EXAM: General: Pt appears well and is in NAD  Eyes: External eye exam normal without stare, lid lag or exophthalmos.  EOM intact.   Neck: General: Supple without adenopathy. Thyroid: Thyroid size normal.  No goiter or nodules appreciated.  Lungs: Clear with good BS bilat with no rales, rhonchi, or wheezes  Heart: Auscultation: RRR.  Abdomen: Normoactive bowel sounds, soft, nontender, without masses or organomegaly palpable  Extremities:  BL LE: No pretibial edema normal  ROM and strength.  Mental Status: Judgment, insight: Intact Orientation: Oriented to time, place, and person Mood and affect: No depression, anxiety, or agitation     DATA REVIEWED:    ****  MRI Brain 07/03/2021  Dedicated pituitary protocol imaging was performed, including dynamic post-contrast imaging. 2-3 mm T2 hyperintense and transiently hypoenhancing focus within the left inferior aspect of the pituitary gland (for instance as seen on series 11, image 6) (series 17, image 5)(series 20, image 6). This is compatible with the provided history of pituitary microadenoma. There is no cavernous sinus invasion. No suprasellar extension. The pituitary stalk is midline and is not abnormally thickened.   Cerebral volume is normal.   There are a few small foci of T2 FLAIR hyperintense signal abnormality scattered within the cerebral white matter, nonspecific and measuring up to 3 mm.   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   No cortical encephalomalacia is identified.   There is no acute infarct   No chronic intracranial blood products.   No extra-axial fluid collection.   No midline shift.   Vascular: Maintained flow voids within the proximal large arterial vessels.   Skull and upper cervical spine: No focal suspicious marrow lesion.   Sinuses/Orbits: Visualized orbits show no acute finding. Severe mucosal thickening within the left maxillary sinus. Mild mucosal thickening, and small-volume frothy secretions, within the left ethmoid and sphenoid sinuses.   IMPRESSION: 2-3 mm T2 hyperintense and transiently hypoenhancing lesion within the left inferior aspect of the pituitary gland, as described and compatible with the provided history of pituitary microdenoma.   There are a few small nonspecific T2 FLAIR hyperintense remote insults scattered within the cerebral white matter (measuring up to 3 mm).   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   Otherwise unremarkable MRI appearance of the brain.   Paranasal sinus disease, as described.       ASSESSMENT/PLAN/RECOMMENDATIONS:   Pituitary Cyst:  - This was an incidental finding on MRI for evaluation of headaches.  - Repeat pituitary  imaging was stable 06/2021 - No clinical concern for hypo or hyperpituitarism but will evaluate biochemically  -****   2. Dandruff :  - Refill on Nizoral done    F/Y in 1 year  Signed electronically by: Lyndle Herrlich, MD  [facility] Endocrinology  [facility] [address] [place] [place], [place] [address] Phone: [phone] FAX: [phone]   CC: Philip Aspen, Limmie Patricia, MD [address] [place] [address] Phone: [phone] Fax: [phone]   Return to Endocrinology clinic as below: Future Appointments  Date Time Provider Department Center  12/11/2021  8:30 AM Jaime Grizzell, Konrad Dolores, MD [facility] None  01/08/2022  9:10 AM Everlena Cooper,  Adam R, DO [facility] None

## 2021-12-11 ENCOUNTER — Ambulatory Visit (INDEPENDENT_AMBULATORY_CARE_PROVIDER_SITE_OTHER): Payer: Medicaid Other | Admitting: Internal Medicine

## 2021-12-11 ENCOUNTER — Encounter: Payer: Self-pay | Admitting: Internal Medicine

## 2021-12-11 VITALS — BP 130/74 | HR 96 | Ht 64.0 in | Wt 201.6 lb

## 2021-12-11 DIAGNOSIS — E237 Disorder of pituitary gland, unspecified: Secondary | ICD-10-CM | POA: Diagnosis not present

## 2021-12-11 DIAGNOSIS — L210 Seborrhea capitis: Secondary | ICD-10-CM

## 2021-12-11 LAB — T4, FREE: Free T4: 0.69 ng/dL (ref 0.60–1.60)

## 2021-12-11 LAB — TSH: TSH: 2.84 u[IU]/mL (ref 0.35–5.50)

## 2021-12-11 LAB — CORTISOL: Cortisol, Plasma: 7.4 ug/dL

## 2021-12-11 MED ORDER — KETOCONAZOLE 2 % EX SHAM
1.0000 | MEDICATED_SHAMPOO | CUTANEOUS | 0 refills | Status: DC
Start: 2021-12-14 — End: 2022-01-08

## 2021-12-11 NOTE — Patient Instructions (Signed)
99

## 2021-12-16 LAB — ACTH: C206 ACTH: 25 pg/mL (ref 6–50)

## 2021-12-16 LAB — PROLACTIN: Prolactin: 8 ng/mL

## 2021-12-16 LAB — INSULIN-LIKE GROWTH FACTOR
IGF-I, LC/MS: 206 ng/mL (ref 63–373)
Z-Score (Female): 0.6 SD (ref ?–2.0)

## 2021-12-17 ENCOUNTER — Ambulatory Visit: Payer: Medicaid Other | Admitting: Family Medicine

## 2022-01-05 NOTE — Progress Notes (Signed)
NEUROLOGY FOLLOW UP OFFICE NOTE  Deborah Lawson [id number]  Assessment/Plan:   1  Atypical left sided headache/facial pain- description sounds cervicogenic but no structural abnormalities on cervical spine MRI.  MRI of brain unrevealing as well 2  Left sided arm pain/numbness - nothing on MRI of cervical spine to explain symptoms 3  acute on chronic low back pain with left lumbar radiculopathy     Titrate gabapentin to 600mg  twice daily Refer to physical therapy for low back pain with lumbar radiculopathy NCV-EMG of left upper extremity Follow up 4-5 months. Patient inquired about checking another image of the brain since she hit her head and her headache/pain is worse.  I explained that I didn't think repeat imaging was warranted.  She is describing her chronic symptoms and her exam is objectively unremarkable.     Subjective:  Deborah Lawson is a 31 year old female who follows up for atypical facial pain.  She is accompanied by Arabic speaking interpreter   UPDATE: Due to ongoing cervical radiculitis, MRI of cervical spine was performed on 07/22/2021, which was personally reviewed and showed mild cervical spondylosis but no significant spinal canal or foraminal stenosis.  NCV-EMG of left upper extremity was ordered but appears to never have been performed.    CT sinuses on 09/21/2021 revealed resolution of previous left maxillary sinusitis.    She continues to have left sided occipital/facial burning pain and pain radiating down the left arm.  Also reports pain behind the eyes.  A couple of months ago, she hit her head and the pain is worse.  She has history of chronic low back pain radiating down the left leg.  This past week, she has worsening left leg pain with anterior numbness from the thigh down to the foot.  No trauma.   Current NSAIDS/analgesics:  ibuprofen Current triptans:  none Current ergotamine:  none Current anti-emetic:  none Current muscle relaxants:   none Current Antihypertensive medications:  none Current Antidepressant medications:  none Current Anticonvulsant medications:  gabapentin 300mg  QHS to twice daily Current anti-CGRP:  none Current Vitamins/Herbal/Supplements:  none Current Antihistamines/Decongestants:  none Other therapy:  none Hormone/birth control:  none   HISTORY: She started having a headache around January  No prior history of headache.  It is a moderate to severe left sided pressure/aching pain involving the left temple, eye, ear and face, including along the jaw and behind TMJ.  Sometimes with associated left sided/occipital numbness and tingling.  It comes and goes during the day the pain lessens and resolves by next day.  They occurs when she feels stressed or depressed, about 3 to 4 days a week.  Sometimes associated neck pain.  No associated nausea, vomiting, photophobia, phonophobia, visual disturbance, autonomic symptoms, numbness or weakness.  She went to the ED where she was diagnosed with a cavity.  She had the tooth pulled but pain persisted.  She was seen in the ED on 04/23/2020 where CT head personally reviewed was unremarkable and she was treated with headache cocktail.  She returned to the ED on 05/31/2020 for left sided TMJ pain where she was prescribed a prednisone taper and instructed to take Motrin as needed.    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  She was referred to endocrinology. She saw the dentist and had the cavity treated. Noted some improvement.  .  Referred to PT for neck/TMJ  pain.  Still with neck pain radiating down left arm.  In March 2023, she was treated for left otitis media.  Still has left ear pain..  Treated for sinusitis and left otitis media.  Still with pain in left ear.  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  She saw endocrinology regarding the pituitary microadenoma with normal workup.     Past NSAIDS/analgesics:  none Past abortive triptans:  none Past abortive ergotamine:  none Past muscle relaxants:  none Past anti-emetic:  none Past antihypertensive medications:  nifedipine Past antidepressant medications:  none Past anticonvulsant medications:  none Past anti-CGRP:  none Past vitamins/Herbal/Supplements:  none Past antihistamines/decongestants:  none Other past therapies:  none  PAST MEDICAL HISTORY: Past Medical History:  Diagnosis Date   Gestational diabetes    Gestational hypertension     MEDICATIONS: Current Outpatient Medications on File Prior to Visit  Medication Sig Dispense Refill   ketoconazole (NIZORAL) 2 % shampoo Apply 1 Application topically 2 (two) times a week. 120 mL 0   meclizine (ANTIVERT) 25 MG tablet Take 1 tablet (25 mg total) by mouth 3 (three) times daily as needed for dizziness (vertigo). 30 tablet 0   [DISCONTINUED] NIFEdipine (PROCARDIA XL) 30 MG 24 hr tablet Take 1 tablet (30 mg total) by mouth daily. 30 tablet 1   No current facility-administered medications on file prior to visit.    ALLERGIES: No Known Allergies  FAMILY HISTORY: Family History  Problem Relation Age of Onset   Hypertension Father       Objective:  Blood pressure 122/87, pulse (!) 102, height 5\' 5"  (1.651 m), weight 201 lb 12.8 oz (91.5 kg), last menstrual period 01/02/2022, SpO2 100 %. General: No acute distress.  Patient appears well-groomed.   Head:  Normocephalic/atraumatic Eyes:  Fundi examined but not visualized Neck: supple, left sided paraspinal tenderness, full range of motion Neurological Exam: alert and oriented to person, place, and time.  Speech fluent and not dysarthric, language intact.  CN II-XII intact. Bulk and tone normal, muscle strength 5/5 throughout.  Sensation to light  touch intact.  Deep tendon reflexes 2+ throughout.  Finger to nose testing intact.  Gait normal, Romberg negative.   01/04/2022, DO  CC: Shon Millet Acosta,MD

## 2022-01-06 ENCOUNTER — Emergency Department (HOSPITAL_COMMUNITY)
Admission: EM | Admit: 2022-01-06 | Discharge: 2022-01-07 | Discharge status: Against Medical Advice | Payer: Medicaid Other | Attending: Emergency Medicine | Admitting: Emergency Medicine

## 2022-01-06 ENCOUNTER — Encounter (HOSPITAL_COMMUNITY): Payer: Self-pay | Admitting: Emergency Medicine

## 2022-01-06 DIAGNOSIS — Z5321 Procedure and treatment not carried out due to patient leaving prior to being seen by health care provider: Secondary | ICD-10-CM | POA: Insufficient documentation

## 2022-01-06 DIAGNOSIS — R519 Headache, unspecified: Secondary | ICD-10-CM | POA: Insufficient documentation

## 2022-01-06 DIAGNOSIS — R509 Fever, unspecified: Secondary | ICD-10-CM | POA: Insufficient documentation

## 2022-01-06 DIAGNOSIS — R109 Unspecified abdominal pain: Secondary | ICD-10-CM | POA: Insufficient documentation

## 2022-01-06 LAB — COMPREHENSIVE METABOLIC PANEL
ALT: 33 U/L (ref 0–44)
AST: 32 U/L (ref 15–41)
Albumin: 3.5 g/dL (ref 3.5–5.0)
Alkaline Phosphatase: 77 U/L (ref 38–126)
Anion gap: 7 (ref 5–15)
BUN: 9 mg/dL (ref 6–20)
CO2: 23 mmol/L (ref 22–32)
Calcium: 8.9 mg/dL (ref 8.9–10.3)
Chloride: 109 mmol/L (ref 98–111)
Creatinine, Ser: 0.67 mg/dL (ref 0.44–1.00)
GFR, Estimated: >60 mL/min (ref >60)
Glucose, Bld: 117 mg/dL — ABNORMAL HIGH (ref 70–99)
Potassium: 3.8 mmol/L (ref 3.5–5.1)
Sodium: 139 mmol/L (ref 135–145)
Total Bilirubin: 0.4 mg/dL (ref 0.3–1.2)
Total Protein: 7.3 g/dL (ref 6.5–8.1)

## 2022-01-06 LAB — CBC WITH DIFFERENTIAL/PLATELET
Abs Immature Granulocytes: 0.02 10*3/uL (ref 0.00–0.07)
Basophils Absolute: 0.1 10*3/uL (ref 0.0–0.1)
Basophils Relative: 1 %
Eosinophils Absolute: 0.1 10*3/uL (ref 0.0–0.5)
Eosinophils Relative: 1 %
HCT: 36.6 % (ref 36.0–46.0)
Hemoglobin: 11.8 g/dL — ABNORMAL LOW (ref 12.0–15.0)
Immature Granulocytes: 0 %
Lymphocytes Relative: 44 %
Lymphs Abs: 4 10*3/uL (ref 0.7–4.0)
MCH: 26 pg (ref 26.0–34.0)
MCHC: 32.2 g/dL (ref 30.0–36.0)
MCV: 80.8 fL (ref 80.0–100.0)
Monocytes Absolute: 0.6 10*3/uL (ref 0.1–1.0)
Monocytes Relative: 7 %
Neutro Abs: 4.3 10*3/uL (ref 1.7–7.7)
Neutrophils Relative %: 47 %
Platelets: 439 10*3/uL — ABNORMAL HIGH (ref 150–400)
RBC: 4.53 MIL/uL (ref 3.87–5.11)
RDW: 13.7 % (ref 11.5–15.5)
WBC: 9 10*3/uL (ref 4.0–10.5)
nRBC: 0 % (ref 0.0–0.2)

## 2022-01-06 LAB — LIPASE, BLOOD: Lipase: 31 U/L (ref 11–51)

## 2022-01-06 LAB — I-STAT BETA HCG BLOOD, ED (MC, WL, AP ONLY): I-stat hCG, quantitative: <5 m[IU]/mL (ref <5)

## 2022-01-06 NOTE — ED Provider Triage Note (Signed)
Emergency Medicine Provider Triage Evaluation Note  Deborah Lawson , a 31 y.o. female  was evaluated in triage.  Pt complains of mild intermittent headache over the last 3 days with increasing abdominal pain over the last 2 days.  Denies changes in bowel habits, vaginal symptoms, or urinary symptoms.  Unsure of fevers though endorses chills.  With some nausea, however denies vomiting.  Occasionally gets headaches, unsure whether these are similar to her regular headaches or not.  Denies vision changes, loss of consciousness, or recent URI symptoms.  Unknown LMP.  Review of Systems  Positive:  Negative: see above  Physical Exam  BP (!) 142/87 (BP Location: Right Arm)   Pulse 100   Temp 98 F (36.7 C) (Oral)   Resp 16   Ht 5\' 4"  (1.626 m)   Wt 91 kg   LMP 01/02/2022   SpO2 100%   BMI 34.44 kg/m  Gen:   Awake, no distress   Resp:  Normal effort  MSK:   Moves extremities without difficulty  Other:  Generalized abdominal tenderness, soft.  No flank tenderness.  Medical Decision Making  Medically screening exam initiated at 7:19 PM.  Appropriate orders placed.  Deborah Lawson was informed that the remainder of the evaluation will be completed by another provider, this initial triage assessment does not replace that evaluation, and the importance of remaining in the ED until their evaluation is complete.     Prince Rome, [place] 57/[birth year]/77 [id number]

## 2022-01-06 NOTE — ED Triage Notes (Signed)
Pt ambulatory to triage, reports HA for 3 days. Also generalized abd pain.

## 2022-01-07 NOTE — ED Notes (Signed)
Pt stated her husband is here to pick her up and she is leaving

## 2022-01-08 ENCOUNTER — Ambulatory Visit: Payer: Medicaid Other | Admitting: Neurology

## 2022-01-08 ENCOUNTER — Other Ambulatory Visit: Payer: Self-pay | Admitting: Internal Medicine

## 2022-01-08 ENCOUNTER — Encounter: Payer: Self-pay | Admitting: Neurology

## 2022-01-08 VITALS — BP 122/87 | HR 102 | Ht 65.0 in | Wt 201.8 lb

## 2022-01-08 DIAGNOSIS — M542 Cervicalgia: Secondary | ICD-10-CM

## 2022-01-08 DIAGNOSIS — G501 Atypical facial pain: Secondary | ICD-10-CM | POA: Diagnosis not present

## 2022-01-08 DIAGNOSIS — R519 Headache, unspecified: Secondary | ICD-10-CM

## 2022-01-08 DIAGNOSIS — M5416 Radiculopathy, lumbar region: Secondary | ICD-10-CM

## 2022-01-08 DIAGNOSIS — R200 Anesthesia of skin: Secondary | ICD-10-CM

## 2022-01-08 DIAGNOSIS — R202 Paresthesia of skin: Secondary | ICD-10-CM

## 2022-01-08 MED ORDER — GABAPENTIN 300 MG PO CAPS: ORAL_CAPSULE | ORAL | 0 refills | Status: DC

## 2022-01-08 MED ORDER — GABAPENTIN 300 MG PO CAPS: 300.0000 mg | ORAL_CAPSULE | Freq: Every day | ORAL | 5 refills | Status: DC

## 2022-01-08 NOTE — Patient Instructions (Signed)
Increase gabapentin to 1 pill in morning and 2 pills at night for a week, then increase to 2 pills twice daily Refer to physical therapy for low back pain and left lumbar radiculopathy Check nerve conduction study of left arm.   Follow up 4-5 months.

## 2022-01-11 ENCOUNTER — Telehealth: Payer: Self-pay

## 2022-01-11 ENCOUNTER — Ambulatory Visit (HOSPITAL_COMMUNITY)
Admission: EM | Admit: 2022-01-11 | Discharge: 2022-01-11 | Disposition: A | Discharge status: Home / Self Care | Payer: Medicaid Other | Attending: Family Medicine | Admitting: Family Medicine

## 2022-01-11 ENCOUNTER — Other Ambulatory Visit: Payer: Self-pay

## 2022-01-11 ENCOUNTER — Encounter (HOSPITAL_COMMUNITY): Payer: Self-pay | Admitting: *Deleted

## 2022-01-11 DIAGNOSIS — R899 Unspecified abnormal finding in specimens from other organs, systems and tissues: Secondary | ICD-10-CM

## 2022-01-11 DIAGNOSIS — R109 Unspecified abdominal pain: Secondary | ICD-10-CM | POA: Diagnosis not present

## 2022-01-11 LAB — CBG MONITORING, ED: Glucose-Capillary: 108 mg/dL — ABNORMAL HIGH (ref 70–99)

## 2022-01-11 MED ORDER — DICYCLOMINE HCL 20 MG PO TABS: 20.0000 mg | ORAL_TABLET | Freq: Four times a day (QID) | ORAL | 0 refills | Status: DC | PRN

## 2022-01-11 NOTE — Telephone Encounter (Signed)
Transition Care Management Follow-up Telephone Call Date of discharge and from where: 01/07/22 from [facility]. Left AMA b/c child was crying uncontrollably How have you been since you were released from the hospital? Pt states she has pain in her foot x 2 days, states still hurting. States still has h/a. Has been taking Gabapentin that has been helpful.  Any questions or concerns? Yes Pt states concerns for lab results drawn in ED. Encouraged her to discuss concerns with PCP. Pt verb understanding.      Follow up appointments reviewed:  PCP Hospital f/u appt confirmed? Yes  Scheduled to see Dr Jerilee Hoh on Nov 1 @ 4pm. Are transportation arrangements needed? No  If their condition worsens, is the pt aware to call PCP or go to the Emergency Dept.? Yes Was the patient provided with contact information for the PCP's office or ED? Yes Was to pt encouraged to call back with questions or concerns? Yes

## 2022-01-11 NOTE — ED Triage Notes (Signed)
Pt reports her hbg is low and blood sugar is high.

## 2022-01-11 NOTE — Discharge Instructions (Addendum)
Take dicyclomine 20 mg--1 tablet 4 times a day as needed for intestinal cramping.  Please keep your appointment with your primary care for November 1

## 2022-01-11 NOTE — ED Provider Notes (Signed)
[facility]    CSN: [id number] Arrival date & time: 01/11/22  1708      History   Chief Complaint Chief Complaint  Patient presents with   leg numbness   Dizziness    HPI Deborah Lawson is a 31 y.o. female.    Dizziness  Here for abnormal labs  She had gone to the emergency room recently and had lab drawn.  She had had fatigue and abdominal cramps and diarrhea at the time.  She comes in today because she thinks she has diabetes because her sugar was 117 on a nonfasting lab.  Also she thinks she was feeling tired because her hemoglobin was 11.8 on that blood draw.  Of note her hemoglobin was 12.1 in the recent months.  Last menstrual cycle was October 21.  The patient has an appointment with her primary care in 2 days  Pain is epigastric and periumbilical.  When she has not eaten while her stomach hurts; she thinks that might mean she has diabetes  She also sometimes has a sweet taste in her mouth --she wonders if that means she has diabetes  Past Medical History:  Diagnosis Date   Gestational diabetes    Gestational hypertension     Patient Active Problem List   Diagnosis Date Noted   Miscarriage 11/11/2021   White matter abnormality on MRI of brain 07/06/2021   Abnormal computed tomography of paranasal sinus 07/06/2021   Vitamin D deficiency 11/11/2020   Iron deficiency 11/11/2020   Gestational diabetes    Gestational hypertension    Language barrier 10/12/2019    Past Surgical History:  Procedure Laterality Date   CESAREAN SECTION N/A 10/26/2019   Procedure: CESAREAN SECTION;  Surgeon: Reva Bores, MD;  Location: [facility];  Service: Obstetrics;  Laterality: N/A;   MANDIBLE SURGERY      OB History     Gravida  4   Para  3   Term  3   Preterm  0   AB  0   Living  3      SAB  0   IAB  0   Ectopic  0   Multiple  0   Live Births  3            Home Medications    Prior to Admission medications   Medication  Sig Start Date End Date Taking? Authorizing Provider  dicyclomine (BENTYL) 20 MG tablet Take 1 tablet (20 mg total) by mouth 4 (four) times daily as needed (intestinal cramps). 01/11/22  Yes Zenia Resides, MD  gabapentin (NEURONTIN) 300 MG capsule Increase to 1 capsule in morning and 2 capsules at bedtime for one week, then increase to 2 capsules twice daily 01/08/22   Drema Dallas, DO  ketoconazole (NIZORAL) 2 % shampoo APPLY 1 APPLICATION TOPICALLY 2 (TWO) TIMES A WEEK. 01/11/22   Shamleffer, Konrad Dolores, MD  meclizine (ANTIVERT) 25 MG tablet Take 1 tablet (25 mg total) by mouth 3 (three) times daily as needed for dizziness (vertigo). 11/27/21   Zenia Resides, MD  NIFEdipine (PROCARDIA XL) 30 MG 24 hr tablet Take 1 tablet (30 mg total) by mouth daily. 11/05/19 02/18/20  Donette Larry, CNM    Family History Family History  Problem Relation Age of Onset   Hypertension Father     Social History Social History   Tobacco Use   Smoking status: Never    Passive exposure: Never   Smokeless tobacco: Never  Vaping Use   Vaping Use: Never used  Substance Use Topics   Alcohol use: Never   Drug use: Never     Allergies   Patient has no known allergies.   Review of Systems Review of Systems  Neurological:  Positive for dizziness.     Physical Exam Triage Vital Signs ED Triage Vitals  Enc Vitals Group     BP 01/11/22 1743 (!) 107/90     Pulse Rate 01/11/22 1743 91     Resp 01/11/22 1743 18     Temp 01/11/22 1743 99.2 F (37.3 C)     Temp src --      SpO2 01/11/22 1743 100 %     Weight --      Height --      Head Circumference --      Peak Flow --      Pain Score 01/11/22 1742 0     Pain Loc --      Pain Edu? --      Excl. in [place]? --    No data found.  Updated Vital Signs BP (!) 107/90   Pulse 91   Temp 99.2 F (37.3 C)   Resp 18   LMP 01/02/2022   SpO2 100%   Visual Acuity Right Eye Distance:   Left Eye Distance:   Bilateral Distance:     Right Eye Near:   Left Eye Near:    Bilateral Near:     Physical Exam Vitals reviewed.  Constitutional:      General: She is not in acute distress.    Appearance: She is not ill-appearing, toxic-appearing or diaphoretic.  HENT:     Mouth/Throat:     Mouth: Mucous membranes are moist.  Cardiovascular:     Rate and Rhythm: Normal rate and regular rhythm.     Heart sounds: No murmur heard. Pulmonary:     Effort: No respiratory distress.     Breath sounds: Normal breath sounds. No stridor. No wheezing or rhonchi.  Abdominal:     Palpations: Abdomen is soft. There is no mass.     Tenderness: There is abdominal tenderness (mild tenderness of epigastrium). There is no guarding.  Neurological:     General: No focal deficit present.     Mental Status: She is alert and oriented to person, place, and time.  Psychiatric:        Behavior: Behavior normal.      UC Treatments / Results  Labs (all labs ordered are listed, but only abnormal results are displayed) Labs Reviewed  CBG MONITORING, ED - Abnormal; Notable for the following components:      Result Value   Glucose-Capillary 108 (*)    All other components within normal limits    EKG   Radiology No results found.  Procedures Procedures (including critical care time)  Medications Ordered in UC Medications - No data to display  Initial Impression / Assessment and Plan / UC Course  I have reviewed the triage vital signs and the nursing notes.  Pertinent labs & imaging results that were available during my care of the patient were reviewed by me and considered in my medical decision making (see chart for details).     I have 25 minutes with this patient reviewing her labs and what they mean and what they do not mean.  I am sending in dicyclomine to try to help her abdominal cramping she is describing.  I have discussed with her that primary  care can do further lab work if needed and further evaluation with any  diagnostic imaging that is actually indicated by her symptoms.   Final Clinical Impressions(s) / UC Diagnoses   Final diagnoses:  Abdominal cramping  Abnormal laboratory test result     Discharge Instructions      Take dicyclomine 20 mg--1 tablet 4 times a day as needed for intestinal cramping.  Please keep your appointment with your primary care for November 1     ED Prescriptions     Medication Sig Dispense Auth. Provider   dicyclomine (BENTYL) 20 MG tablet Take 1 tablet (20 mg total) by mouth 4 (four) times daily as needed (intestinal cramps). 40 tablet Amoria Mclees, Janace Aris, MD      PDMP not reviewed this encounter.   Zenia Resides, MD 01/11/22 [phone]

## 2022-01-13 ENCOUNTER — Encounter: Payer: Self-pay | Admitting: Internal Medicine

## 2022-01-13 ENCOUNTER — Ambulatory Visit (INDEPENDENT_AMBULATORY_CARE_PROVIDER_SITE_OTHER): Payer: Medicaid Other | Admitting: Internal Medicine

## 2022-01-13 VITALS — BP 110/76 | HR 100 | Temp 98.2°F | Wt 205.4 lb

## 2022-01-13 DIAGNOSIS — E559 Vitamin D deficiency, unspecified: Secondary | ICD-10-CM | POA: Diagnosis not present

## 2022-01-13 DIAGNOSIS — E611 Iron deficiency: Secondary | ICD-10-CM

## 2022-01-13 DIAGNOSIS — O24419 Gestational diabetes mellitus in pregnancy, unspecified control: Secondary | ICD-10-CM | POA: Diagnosis not present

## 2022-01-13 NOTE — Patient Instructions (Signed)
-  Start ferrous sulfate 325 mg daily. This is over the counter.  _Schedule annual physical in 3 months. Please come in fasting that day.

## 2022-01-13 NOTE — Progress Notes (Signed)
Established Patient Office Visit     CC/Reason for Visit: ED follow-up  HPI: Deborah Lawson is a 31 y.o. female who is coming in today for the above mentioned reasons.  She visited the emergency department for reasons that are unclear to me and was found to be slightly anemic with a hemoglobin of 11.8 and had a nonfasting glucose of 117.  She has cut out all sugar.  She is concerned that she might be diabetic.  Visit is aided by use of an in person translator.   Past Medical/Surgical History: Past Medical History:  Diagnosis Date   Gestational diabetes    Gestational hypertension     Past Surgical History:  Procedure Laterality Date   CESAREAN SECTION N/A 10/26/2019   Procedure: CESAREAN SECTION;  Surgeon: Donnamae Jude, MD;  Location: [facility];  Service: Obstetrics;  Laterality: N/A;   MANDIBLE SURGERY      Social History:  reports that she has never smoked. She has never been exposed to tobacco smoke. She has never used smokeless tobacco. She reports that she does not drink alcohol and does not use drugs.  Allergies: No Known Allergies  Family History:  Family History  Problem Relation Age of Onset   Hypertension Father      Current Outpatient Medications:    dicyclomine (BENTYL) 20 MG tablet, Take 1 tablet (20 mg total) by mouth 4 (four) times daily as needed (intestinal cramps)., Disp: 40 tablet, Rfl: 0   gabapentin (NEURONTIN) 300 MG capsule, Increase to 1 capsule in morning and 2 capsules at bedtime for one week, then increase to 2 capsules twice daily, Disp: 120 capsule, Rfl: 0   ketoconazole (NIZORAL) 2 % shampoo, APPLY 1 APPLICATION TOPICALLY 2 (TWO) TIMES A WEEK., Disp: 120 mL, Rfl: 0   meclizine (ANTIVERT) 25 MG tablet, Take 1 tablet (25 mg total) by mouth 3 (three) times daily as needed for dizziness (vertigo)., Disp: 30 tablet, Rfl: 0  Review of Systems:  Constitutional: Denies fever, chills, diaphoresis, appetite change and fatigue.  HEENT:  Denies photophobia, eye pain, redness, hearing loss, ear pain, congestion, sore throat, rhinorrhea, sneezing, mouth sores, trouble swallowing, neck pain, neck stiffness and tinnitus.   Respiratory: Denies SOB, DOE, cough, chest tightness,  and wheezing.   Cardiovascular: Denies chest pain, palpitations and leg swelling.  Gastrointestinal: Denies nausea, vomiting, abdominal pain, diarrhea, constipation, blood in stool and abdominal distention.  Genitourinary: Denies dysuria, urgency, frequency, hematuria, flank pain and difficulty urinating.  Endocrine: Denies: hot or cold intolerance, sweats, changes in hair or nails, polyuria, polydipsia. Musculoskeletal: Denies myalgias, back pain, joint swelling, arthralgias and gait problem.  Skin: Denies pallor, rash and wound.  Neurological: Denies dizziness, seizures, syncope, weakness, light-headedness, numbness and headaches.  Hematological: Denies adenopathy. Easy bruising, personal or family bleeding history  Psychiatric/Behavioral: Denies suicidal ideation, mood changes, confusion, nervousness, sleep disturbance and agitation    Physical Exam: Vitals:   01/13/22 1543  BP: 110/76  Pulse: 100  Temp: 98.2 F (36.8 C)  TempSrc: Oral  SpO2: 99%  Weight: 205 lb 6.4 oz (93.2 kg)    Body mass index is 34.18 kg/m.   Constitutional: NAD, calm, comfortable Eyes: PERRL, lids and conjunctivae normal ENMT: Mucous membranes are moist Psychiatric: Normal judgment and insight. Alert and oriented x 3. Normal mood.    Impression and Plan:  Iron deficiency  Vitamin D deficiency - Plan: VITAMIN D 25 Hydroxy (Vit-D Deficiency, Fractures)  Gestational diabetes mellitus (GDM), antepartum,  gestational diabetes method of control unspecified - Plan: Hemoglobin A1c  -ED charts reviewed in detail.  She was found to be mildly anemic with a hemoglobin of 11.8.  Nonfasting glucose was 117.  She is concerned about diabetes as she has had gestational diabetes  in the past.  Check A1c today, start on ferrous sulfate 325 mg daily, return in 3 months for follow-up.  She is also wanting her vitamin D levels checked.  Time spent:31 minutes reviewing chart, interviewing and examining patient and formulating plan of care.   Patient Instructions  -Start ferrous sulfate 325 mg daily. This is over the counter.  _Schedule annual physical in 3 months. Please come in fasting that day.    Chaya Jan, MD [facility]

## 2022-01-14 ENCOUNTER — Other Ambulatory Visit: Payer: Self-pay | Admitting: Internal Medicine

## 2022-01-14 ENCOUNTER — Encounter: Payer: Self-pay | Admitting: Internal Medicine

## 2022-01-14 DIAGNOSIS — E559 Vitamin D deficiency, unspecified: Secondary | ICD-10-CM

## 2022-01-14 LAB — VITAMIN D 25 HYDROXY (VIT D DEFICIENCY, FRACTURES): VITD: 24.29 ng/mL — ABNORMAL LOW (ref 30.00–100.00)

## 2022-01-14 LAB — HEMOGLOBIN A1C: Hgb A1c MFr Bld: 5.8 % (ref 4.6–6.5)

## 2022-01-14 MED ORDER — VITAMIN D (ERGOCALCIFEROL) 1.25 MG (50000 UNIT) PO CAPS: 50000.0000 [IU] | ORAL_CAPSULE | ORAL | 0 refills | Status: AC

## 2022-01-18 ENCOUNTER — Encounter: Payer: Self-pay | Admitting: Physical Therapy

## 2022-01-18 ENCOUNTER — Encounter: Payer: Self-pay | Admitting: General Practice

## 2022-01-18 ENCOUNTER — Ambulatory Visit: Payer: Medicaid Other | Attending: Neurology | Admitting: Physical Therapy

## 2022-01-18 DIAGNOSIS — M542 Cervicalgia: Secondary | ICD-10-CM | POA: Insufficient documentation

## 2022-01-18 DIAGNOSIS — M26609 Unspecified temporomandibular joint disorder, unspecified side: Secondary | ICD-10-CM | POA: Insufficient documentation

## 2022-01-18 DIAGNOSIS — M5416 Radiculopathy, lumbar region: Secondary | ICD-10-CM | POA: Insufficient documentation

## 2022-01-18 NOTE — Therapy (Signed)
OUTPATIENT PHYSICAL THERAPY THORACOLUMBAR EVALUATION   Patient Name: Deborah Lawson MRN: [id number] DOB:1990-10-18, 31 y.o., female Today's Date: 01/18/2022   PT End of Session - 01/18/22 1542     Visit Number 1    Number of Visits 13    Date for PT Re-Evaluation 03/01/22    Authorization Type [employer]    Authorization Time Period 01/18/22 to 03/01/22    PT Start Time 1358    PT Stop Time 1440    PT Time Calculation (min) 42 min    Activity Tolerance Patient tolerated treatment well    Behavior During Therapy [facility] for tasks assessed/performed             Past Medical History:  Diagnosis Date   Gestational diabetes    Gestational hypertension    Past Surgical History:  Procedure Laterality Date   CESAREAN SECTION N/A 10/26/2019   Procedure: CESAREAN SECTION;  Surgeon: Deborah Jude, MD;  Location: [facility] LD ORS;  Service: Obstetrics;  Laterality: N/A;   MANDIBLE SURGERY     Patient Active Problem List   Diagnosis Date Noted   Miscarriage 11/11/2021   White matter abnormality on MRI of brain 07/06/2021   Abnormal computed tomography of paranasal sinus 07/06/2021   Vitamin D deficiency 11/11/2020   Iron deficiency 11/11/2020   Gestational diabetes    Gestational hypertension    Language barrier 10/12/2019    PCP: Deborah Lawson, Deborah Lawson   REFERRING PROVIDER: Pieter Partridge, DO  REFERRING DIAG: M54.2 (ICD-10-CM) - Cervicalgia M54.16 (ICD-10-CM) - Left lumbar radiculopathy  Rationale for Evaluation and Treatment: Rehabilitation  THERAPY DIAG:  No diagnosis found.  ONSET DATE: 01/08/2022  SUBJECTIVE:                                                                                                                                                                                           SUBJECTIVE STATEMENT:  The neck is bothering me more than my back; hit head when she was holding a baby recently, this happened 2 months ago. Things that make her pain worse  are headache she feels better when she rests. Some numbness going into R hand , also has some numbness in R leg. For my back, there is pain in the lower back and going into her left leg. That pain has been around for about 10 days, cold weather makes it worse just rest makes it better.   PERTINENT HISTORY:     NEUROLOGY FOLLOW UP OFFICE NOTE   Deborah Lawson [id number]   Assessment/Plan:    1  Atypical left sided headache/facial pain- description sounds cervicogenic  but no structural abnormalities on cervical spine MRI.  MRI of brain unrevealing as well 2  Left sided arm pain/numbness - nothing on MRI of cervical spine to explain symptoms 3  acute on chronic low back pain with left lumbar radiculopathy     Titrate gabapentin to 600mg  twice daily Refer to physical therapy for low back pain with lumbar radiculopathy NCV-EMG of left upper extremity Follow up 4-5 months. Patient inquired about checking another image of the brain since she hit her head and her headache/pain is worse.  I explained that I didn't think repeat imaging was warranted.  She is describing her chronic symptoms and her exam is objectively unremarkable.     Subjective:  Deborah Lawson is a 31 year old female who follows up for atypical facial pain.  She is accompanied by Arabic speaking interpreter   UPDATE: Due to ongoing cervical radiculitis, MRI of cervical spine was performed on 07/22/2021, which was personally reviewed and showed mild cervical spondylosis but no significant spinal canal or foraminal stenosis.  NCV-EMG of left upper extremity was ordered but appears to never have been performed.     CT sinuses on 09/21/2021 revealed resolution of previous left maxillary sinusitis.     She continues to have left sided occipital/facial burning pain and pain radiating down the left arm.  Also reports pain behind the eyes.  A couple of months ago, she hit her head and the pain is worse.   She has history of chronic low  back pain radiating down the left leg.  This past week, she has worsening left leg pain with anterior numbness from the thigh down to the foot.  No trauma.   Current NSAIDS/analgesics:  ibuprofen Current triptans:  none Current ergotamine:  none Current anti-emetic:  none Current muscle relaxants:  none Current Antihypertensive medications:  none Current Antidepressant medications:  none Current Anticonvulsant medications:  gabapentin 300mg  QHS to twice daily Current anti-CGRP:  none Current Vitamins/Herbal/Supplements:  none Current Antihistamines/Decongestants:  none Other therapy:  none Hormone/birth control:  none   HISTORY: She started having a headache around January  No prior history of headache.  It is a moderate to severe left sided pressure/aching pain involving the left temple, eye, ear and face, including along the jaw and behind TMJ.  Sometimes with associated left sided/occipital numbness and tingling.  It comes and goes during the day the pain lessens and resolves by next day.  They occurs when she feels stressed or depressed, about 3 to 4 days a week.  Sometimes associated neck pain.  No associated nausea, vomiting, photophobia, phonophobia, visual disturbance, autonomic symptoms, numbness or weakness.  She went to the ED where she was diagnosed with a cavity.  She had the tooth pulled but pain persisted.  She was seen in the ED on 04/23/2020 where CT head personally reviewed was unremarkable and she was treated with headache cocktail.  She returned to the ED on 05/31/2020 for left sided TMJ pain where she was prescribed a prednisone taper and instructed to take Motrin as needed.    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  She was referred to endocrinology. She saw the dentist and had the cavity treated. Noted some improvement.  .  Referred to PT for neck/TMJ pain.  Still with neck pain  radiating down left arm.  In March 2023, she was treated for left otitis media.  Still has left ear pain..  Treated for sinusitis and left otitis media.  Still with pain in left ear.  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  She saw endocrinology regarding the pituitary microadenoma with normal workup.      PAIN:  Are you having pain? Yes: NPRS scale: 8/10 in neck, 4/10 in back  Pain location: neck and low back  Pain description: constant  Aggravating factors: unclear,unsure  Relieving factors: rest   PRECAUTIONS: None  WEIGHT BEARING RESTRICTIONS: No  FALLS:  Has patient fallen in last 6 months? No  LIVING ENVIRONMENT: Lives with: lives with their family Lives in: House/apartment Stairs: no steps, 1st floor apt  Has following equipment at home: None  OCCUPATION: none   PLOF: Independent and Independent with basic ADLs  PATIENT GOALS: wondering if exercise and PT can help leg/neck pain   NEXT MD VISIT: March with Dr. Everlena Cooper   OBJECTIVE:   DIAGNOSTIC FINDINGS:  IMPRESSION: Mild cervical spondylosis, as outlined. No more than mild disc degeneration. Multilevel shallow disc bulges. No significant spinal canal or foraminal stenosis.   Nonspecific reversal of the expected cervical lordosis.   No signal abnormality identified within the cervical spinal cord.   8 mm well-circumscribed T2 hyperintense focus in the region of the left palatine tonsil, which may reflect a tonsillar cyst.  IMPRESSION: 2-3 mm T2 hyperintense and transiently hypoenhancing lesion within the left inferior aspect of the pituitary gland, as described and compatible with the provided history of pituitary microdenoma.   There are a few small nonspecific T2 FLAIR hyperintense remote insults scattered within the cerebral white matter (measuring  up to 3 mm).   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   Otherwise unremarkable MRI appearance of the brain.   Paranasal sinus disease, as described  PATIENT SURVEYS:   NDI 26/50  SCREENING FOR RED FLAGS: Bowel or bladder incontinence: No Spinal tumors: No Cauda equina syndrome: No Compression fracture: No Abdominal aneurysm: No  COGNITION: Overall cognitive status: Within functional limits for tasks assessed     SENSATION: Not tested  MUSCLE LENGTH:  Mild HS tightness B, moderate piriformis tightness B but L>R   POSTURE: rounded shoulders, forward head, increased lumbar lordosis, and increased thoracic kyphosis  PALPATION: Significant trigger points noted B upper traps, also B rotator cuffs, L thoracic paraspinal spasms   LUMBAR ROM:   AROM eval  Flexion WNL, RFIS increased back pain   Extension Mild-moderate limitation; REIS some pain, improved baseline pain   Right lateral flexion WNL   Left lateral flexion WNL   Right rotation   Left rotation    (Blank rows = not tested)  Cervical ROM: flexion 42* L pain, extension 31 B pain, lateral flexion WNL increased pain with R sidebend, cervical rotation WNL but pain L side   Thoracic ROM: flexion WNL, extension moderate limitation L shoulder pain, WNL but stretching L, rotation WNL but stretching pain L   LOWER EXTREMITY ROM:     Active  Right eval Left eval  Hip flexion    Hip extension    Hip abduction    Hip adduction    Hip internal rotation    Hip external rotation    Knee flexion    Knee extension    Ankle dorsiflexion    Ankle plantarflexion    Ankle  inversion    Ankle eversion     (Blank rows = not tested)  LOWER EXTREMITY MMT:    MMT Right eval Left eval  Hip flexion    Hip extension    Hip abduction    Hip adduction    Hip internal rotation    Hip external rotation    Knee flexion    Knee extension    Ankle dorsiflexion    Ankle plantarflexion     Ankle inversion    Ankle eversion     (Blank rows = not tested)  LUMBAR SPECIAL TESTS:    FUNCTIONAL TESTS:    GAIT:   TODAY'S TREATMENT:                                                                                                                              DATE:   Eval   Piriformis stretches 1x30 seconds B Thoracic extensions x10 Chin tucks x10         PATIENT EDUCATION:  Education details: exam findings, POC, HEP  Person educated: Patient Education method: Explanation, Demonstration, and Handouts Education comprehension: verbalized understanding and returned demonstration  HOME EXERCISE PROGRAM: [id number]  ASSESSMENT:  CLINICAL IMPRESSION: Patient is a 31 y.o. F who was seen today for physical therapy evaluation and treatment for cervical and lumbar pain. Exam seems to indicate likely MSK cause, included poor posture, postural muscle weakness, impaired flexibility, and multiple trigger points. Seems to be responding to initial extension based program for lumbar spine as well. Will benefit from skilled PT services to address pain and assist in return to optimal level of function.   OBJECTIVE IMPAIRMENTS: decreased ROM, decreased strength, hypomobility, increased fascial restrictions, increased muscle spasms, impaired flexibility, impaired sensation, improper body mechanics, postural dysfunction, and pain.   ACTIVITY LIMITATIONS: carrying, lifting, transfers, bed mobility, toileting, dressing, reach over head, hygiene/grooming, and caring for others  PARTICIPATION LIMITATIONS: cleaning, laundry, driving, shopping, community activity, and occupation  PERSONAL FACTORS: Age, Behavior pattern, Education, Fitness, Social background, and Time since onset of injury/illness/exacerbation are also affecting patient's functional outcome.   REHAB POTENTIAL: Good  CLINICAL DECISION MAKING: Stable/uncomplicated  EVALUATION COMPLEXITY: Low   GOALS: Goals reviewed with  patient? No  SHORT TERM GOALS: Target date: 02/08/2022  Will be compliant with appropriate progressive HEP  Baseline: Goal status: INITIAL  2.  Pain in neck to be no more than 4/10 and 2/10 in back at worst  Baseline:  Goal status: INITIAL  3.  Will have better understanding of posture/biomechanics  Baseline:  Goal status: INITIAL  4.  Cervical flexion/extension ROM to be WNL  Baseline:  Goal status: INITIAL    LONG TERM GOALS: Target date: 03/01/2022  MMT to be assessed/appropriate LTG written  Baseline:  Goal status: INITIAL  2.  Will be able to complete all functional dressing/bathing tasks and child care without increase in pain  Baseline:  Goal status: INITIAL  3.  Will demonstrate good  biomechanics for floor to waist height lifting  Baseline:  Goal status: INITIAL  4.  Will be able to drive and perform all household tasks/responsibilities without increase in pain Baseline:  Goal status: INITIAL   PLAN:  PT FREQUENCY: 2x/week  PT DURATION: 6 weeks  PLANNED INTERVENTIONS: Therapeutic exercises, Therapeutic activity, Neuromuscular re-education, Patient/Family education, Self Care, Manual therapy, and Re-evaluation.  PLAN FOR NEXT SESSION: still need to check MMT; postural training and strengthening, core/hip strengthening, general mobility for spine as a whole, biomechanics    Milton Streicher U PT DPT PN2  01/18/2022, 3:48 PM   Check all possible CPT codes: 44619 - PT Re-evaluation, 97110- Therapeutic Exercise, [phone]- Neuro Re-education, [phone] - Manual Therapy, 97530 - Therapeutic Activities, and 97535 - Self Care    Check all conditions that are expected to impact treatment: Musculoskeletal disorders and Social determinants of health   If treatment provided at initial evaluation, no treatment charged due to lack of authorization.

## 2022-01-19 ENCOUNTER — Ambulatory Visit: Payer: Medicaid Other | Admitting: Internal Medicine

## 2022-01-19 ENCOUNTER — Other Ambulatory Visit: Payer: Self-pay | Admitting: *Deleted

## 2022-01-19 DIAGNOSIS — E559 Vitamin D deficiency, unspecified: Secondary | ICD-10-CM

## 2022-01-22 ENCOUNTER — Encounter: Payer: Self-pay | Admitting: Physical Therapy

## 2022-01-22 ENCOUNTER — Ambulatory Visit: Payer: Medicaid Other | Admitting: Physical Therapy

## 2022-01-22 DIAGNOSIS — M5416 Radiculopathy, lumbar region: Secondary | ICD-10-CM

## 2022-01-22 DIAGNOSIS — M542 Cervicalgia: Secondary | ICD-10-CM | POA: Diagnosis not present

## 2022-01-22 NOTE — Therapy (Signed)
OUTPATIENT PHYSICAL THERAPY THORACOLUMBAR TREATMENT   Patient Name: Deborah Lawson MRN: [id number] DOB:[birth year], 31 y.o., female Today's Date: 01/22/2022   PT End of Session - 01/22/22 1106     Visit Number 2    Number of Visits 13    Date for PT Re-Evaluation 03/01/22    Authorization Type [employer]    Authorization Time Period 01/18/22 to 03/01/22    PT Start Time 1015    PT Stop Time 1057    PT Time Calculation (min) 42 min    Activity Tolerance Patient tolerated treatment well    Behavior During Therapy [facility] for tasks assessed/performed              Past Medical History:  Diagnosis Date   Gestational diabetes    Gestational hypertension    Past Surgical History:  Procedure Laterality Date   CESAREAN SECTION N/A 10/26/2019   Procedure: CESAREAN SECTION;  Surgeon: Reva Bores, MD;  Location: [facility] LD ORS;  Service: Obstetrics;  Laterality: N/A;   MANDIBLE SURGERY     Patient Active Problem List   Diagnosis Date Noted   Miscarriage 11/11/2021   White matter abnormality on MRI of brain 07/06/2021   Abnormal computed tomography of paranasal sinus 07/06/2021   Vitamin D deficiency 11/11/2020   Iron deficiency 11/11/2020   Gestational diabetes    Gestational hypertension    Language barrier 10/12/2019    PCP: Philip Aspen, Minerva Ends   REFERRING PROVIDER: Drema Dallas, DO  REFERRING DIAG: M54.2 (ICD-10-CM) - Cervicalgia M54.16 (ICD-10-CM) - Left lumbar radiculopathy  Rationale for Evaluation and Treatment: Rehabilitation  THERAPY DIAG:  No diagnosis found.  ONSET DATE: 01/08/2022  SUBJECTIVE:                                                                                                                                                                                           SUBJECTIVE STATEMENT:  Feeling good, HEP is helping a lot  PERTINENT HISTORY:     NEUROLOGY FOLLOW UP OFFICE NOTE   Deborah Lawson [id number]   Assessment/Plan:     1  Atypical left sided headache/facial pain- description sounds cervicogenic but no structural abnormalities on cervical spine MRI.  MRI of brain unrevealing as well 2  Left sided arm pain/numbness - nothing on MRI of cervical spine to explain symptoms 3  acute on chronic low back pain with left lumbar radiculopathy     Titrate gabapentin to 600mg  twice daily Refer to physical therapy for low back pain with lumbar radiculopathy NCV-EMG of left upper extremity Follow up 4-5 months. Patient inquired about checking another image  of the brain since she hit her head and her headache/pain is worse.  I explained that I didn't think repeat imaging was warranted.  She is describing her chronic symptoms and her exam is objectively unremarkable.     Subjective:  Deborah Lawson is a 31 year old female who follows up for atypical facial pain.  She is accompanied by Arabic speaking interpreter   UPDATE: Due to ongoing cervical radiculitis, MRI of cervical spine was performed on 07/22/2021, which was personally reviewed and showed mild cervical spondylosis but no significant spinal canal or foraminal stenosis.  NCV-EMG of left upper extremity was ordered but appears to never have been performed.     CT sinuses on 09/21/2021 revealed resolution of previous left maxillary sinusitis.     She continues to have left sided occipital/facial burning pain and pain radiating down the left arm.  Also reports pain behind the eyes.  A couple of months ago, she hit her head and the pain is worse.   She has history of chronic low back pain radiating down the left leg.  This past week, she has worsening left leg pain with anterior numbness from the thigh down to the foot.  No trauma.   Current NSAIDS/analgesics:  ibuprofen Current triptans:  none Current ergotamine:  none Current anti-emetic:  none Current muscle relaxants:  none Current Antihypertensive medications:  none Current Antidepressant medications:   none Current Anticonvulsant medications:  gabapentin 300mg  QHS to twice daily Current anti-CGRP:  none Current Vitamins/Herbal/Supplements:  none Current Antihistamines/Decongestants:  none Other therapy:  none Hormone/birth control:  none   HISTORY: She started having a headache around January  No prior history of headache.  It is a moderate to severe left sided pressure/aching pain involving the left temple, eye, ear and face, including along the jaw and behind TMJ.  Sometimes with associated left sided/occipital numbness and tingling.  It comes and goes during the day the pain lessens and resolves by next day.  They occurs when she feels stressed or depressed, about 3 to 4 days a week.  Sometimes associated neck pain.  No associated nausea, vomiting, photophobia, phonophobia, visual disturbance, autonomic symptoms, numbness or weakness.  She went to the ED where she was diagnosed with a cavity.  She had the tooth pulled but pain persisted.  She was seen in the ED on 04/23/2020 where CT head personally reviewed was unremarkable and she was treated with headache cocktail.  She returned to the ED on 05/31/2020 for left sided TMJ pain where she was prescribed a prednisone taper and instructed to take Motrin as needed.    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  She was referred to endocrinology. She saw the dentist and had the cavity treated. Noted some improvement.  .  Referred to PT for neck/TMJ pain.  Still with neck pain radiating down left arm.  In March 2023, she was treated for left otitis media.  Still has left ear pain..  Treated for sinusitis and left otitis media.  Still with pain in left ear.  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  She saw  endocrinology regarding the pituitary microadenoma with normal workup.      PAIN:  Are you having pain? Yes: NPRS scale: 5/10  Pain location: neck and low back  Pain description: sometimes both legs are numb in the front, neck feels more like pressure  Aggravating factors: nothing Relieving factors: HEP  PRECAUTIONS: None  WEIGHT BEARING RESTRICTIONS: No  FALLS:  Has patient fallen in last 6 months? No  LIVING ENVIRONMENT: Lives with: lives with their family Lives in: House/apartment Stairs: no steps, 1st floor apt  Has following equipment at home: None  OCCUPATION: none   PLOF: Independent and Independent with basic ADLs  PATIENT GOALS: wondering if exercise and PT can help leg/neck pain   NEXT MD VISIT: March with Dr. Everlena Cooper   OBJECTIVE:   DIAGNOSTIC FINDINGS:  IMPRESSION: Mild cervical spondylosis, as outlined. No more than mild disc degeneration. Multilevel shallow disc bulges. No significant spinal canal or foraminal stenosis.   Nonspecific reversal of the expected cervical lordosis.   No signal abnormality identified within the cervical spinal cord.   8 mm well-circumscribed T2 hyperintense focus in the region of the left palatine tonsil, which may reflect a tonsillar cyst.  IMPRESSION: 2-3 mm T2 hyperintense and transiently hypoenhancing lesion within the left inferior aspect of the pituitary gland, as described and compatible with the provided history of pituitary microdenoma.   There are a few small nonspecific T2 FLAIR hyperintense remote insults scattered within the cerebral white matter (measuring up to 3 mm).   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   Otherwise unremarkable MRI appearance of the brain.   Paranasal sinus disease, as described  PATIENT SURVEYS:   NDI 26/50  SCREENING FOR RED FLAGS: Bowel or bladder incontinence: No Spinal tumors: No Cauda equina syndrome: No Compression fracture:  No Abdominal aneurysm: No  COGNITION: Overall cognitive status: Within functional limits for tasks assessed     SENSATION: Not tested  MUSCLE LENGTH:  Mild HS tightness B, moderate piriformis tightness B but L>R   POSTURE: rounded shoulders, forward head, increased lumbar lordosis, and increased thoracic kyphosis  PALPATION: Significant trigger points noted B upper traps, also B rotator cuffs, L thoracic paraspinal spasms   LUMBAR ROM:   AROM eval  Flexion WNL, RFIS increased back pain   Extension Mild-moderate limitation; REIS some pain, improved baseline pain   Right lateral flexion WNL   Left lateral flexion WNL   Right rotation   Left rotation    (Blank rows = not tested)  Cervical ROM: flexion 42* L pain, extension 31 B pain, lateral flexion WNL increased pain with R sidebend, cervical rotation WNL but pain L side   Thoracic ROM: flexion WNL, extension moderate limitation L shoulder pain, WNL but stretching L, rotation WNL but stretching pain L   LOWER EXTREMITY ROM:     Active  Right eval Left eval  Hip flexion    Hip extension    Hip abduction    Hip adduction    Hip internal rotation    Hip external rotation    Knee flexion    Knee extension    Ankle dorsiflexion    Ankle plantarflexion    Ankle inversion    Ankle eversion     (Blank rows = not tested)  LOWER EXTREMITY MMT:    MMT Right eval Left eval 11/10 R 11/10 L  Hip flexion   4 4  Hip extension   3 4+  Hip abduction   4+ 4+  Hip adduction      Hip internal rotation      Hip external rotation  Knee flexion      Knee extension   5 5  Ankle dorsiflexion   5 5  Ankle plantarflexion      Ankle inversion      Ankle eversion       (Blank rows = not tested)  UE strength: shoulder flexion R 4/5 L 5/5, ABD R 4/5 L 5/5 biceps 5/5 B triceps 5/5 B   LUMBAR SPECIAL TESTS:    FUNCTIONAL TESTS:    GAIT:   TODAY'S TREATMENT:                                                                                                                               DATE:   01/22/22  TherEx  Chin tucks x12 3 second holds  UT stretches 3x15 seconds B Levator stretches 2x15 seconds B Doorway pec stretch 3x15 seconds  Scapular retractions red TB 1x15  Shoulder extensions red TB 1x10 Lat stretches 3x15 seconds B  Scap retractions + ER with yellow TB x10    Eval   Piriformis stretches 1x30 seconds B Thoracic extensions x10 Chin tucks x10         PATIENT EDUCATION:  Education details: exercise form and purpose, feelings of tightness and pressure/pulling especially during stretches for tight muscles, encouragement to hold stretches longer  Person educated: Patient Education method: Explanation, Demonstration, and Handouts Education comprehension: verbalized understanding and returned demonstration  HOME EXERCISE PROGRAM: [id number]  ASSESSMENT:  CLINICAL IMPRESSION:  Heydi arrives today dong better, exercises are already helping her at home. Wanted to work on her neck today instead of her back. Spent some time working on stretches for appropriate mm groups as well as postural training with light TB. Did need a lot of encouragement for challenging tasks today but did well overall. Postural mm groups very easily fatigued I do think this is contributing to her pain and poor biomechanics. Added pec stretch to HEP due to these mm groups being very tight and actually somewhat limiting during some exercises today.   OBJECTIVE IMPAIRMENTS: decreased ROM, decreased strength, hypomobility, increased fascial restrictions, increased muscle spasms, impaired flexibility, impaired sensation, improper body mechanics, postural dysfunction, and pain.   ACTIVITY LIMITATIONS: carrying, lifting, transfers, bed mobility, toileting, dressing, reach over head, hygiene/grooming, and caring for others  PARTICIPATION LIMITATIONS: cleaning, laundry, driving, shopping, community activity, and  occupation  PERSONAL FACTORS: Age, Behavior pattern, Education, Fitness, Social background, and Time since onset of injury/illness/exacerbation are also affecting patient's functional outcome.   REHAB POTENTIAL: Good  CLINICAL DECISION MAKING: Stable/uncomplicated  EVALUATION COMPLEXITY: Low   GOALS: Goals reviewed with patient? No  SHORT TERM GOALS: Target date: 02/08/2022  Will be compliant with appropriate progressive HEP  Baseline: Goal status: INITIAL  2.  Pain in neck to be no more than 4/10 and 2/10 in back at worst  Baseline:  Goal status: INITIAL  3.  Will have better understanding of posture/biomechanics  Baseline:  Goal status: INITIAL  4.  Cervical flexion/extension ROM to be WNL  Baseline:  Goal status: INITIAL    LONG TERM GOALS: Target date: 03/01/2022  MMT to be assessed/appropriate LTG written  Baseline:  Goal status: INITIAL  2.  Will be able to complete all functional dressing/bathing tasks and child care without increase in pain  Baseline:  Goal status: INITIAL  3.  Will demonstrate good biomechanics for floor to waist height lifting  Baseline:  Goal status: INITIAL  4.  Will be able to drive and perform all household tasks/responsibilities without increase in pain Baseline:  Goal status: INITIAL   PLAN:  PT FREQUENCY: 2x/week  PT DURATION: 6 weeks  PLANNED INTERVENTIONS: Therapeutic exercises, Therapeutic activity, Neuromuscular re-education, Patient/Family education, Self Care, Manual therapy, and Re-evaluation.  PLAN FOR NEXT SESSION: postural training and strengthening, core/hip strengthening, general mobility for spine as a whole, biomechanics    Aeon Koors U PT DPT PN2  01/22/2022, 11:07 AM   Check all possible CPT codes: 67893 - PT Re-evaluation, 97110- Therapeutic Exercise, [phone]- Neuro Re-education, 97140 - Manual Therapy, 97530 - Therapeutic Activities, and 97535 - Self Care    Check all conditions that are expected to  impact treatment: Musculoskeletal disorders and Social determinants of health   If treatment provided at initial evaluation, no treatment charged due to lack of authorization.

## 2022-01-26 ENCOUNTER — Ambulatory Visit: Payer: Medicaid Other | Admitting: Physical Therapy

## 2022-01-26 ENCOUNTER — Encounter: Payer: Self-pay | Admitting: Physical Therapy

## 2022-01-26 DIAGNOSIS — M542 Cervicalgia: Secondary | ICD-10-CM | POA: Diagnosis not present

## 2022-01-26 DIAGNOSIS — M5416 Radiculopathy, lumbar region: Secondary | ICD-10-CM

## 2022-01-26 NOTE — Therapy (Signed)
OUTPATIENT PHYSICAL THERAPY THORACOLUMBAR TREATMENT   Patient Name: Deborah Lawson MRN: [id number] DOB:03-18-1990, 31 y.o., female Today's Date: 01/26/2022   PT End of Session - 01/26/22 1040     Visit Number 3    Number of Visits 13    Date for PT Re-Evaluation 03/01/22    Authorization Type [employer] Medicaid    Authorization Time Period 01/18/22 to 03/01/22    PT Start Time 1012    PT Stop Time 1058    PT Time Calculation (min) 46 min    Activity Tolerance Patient tolerated treatment well    Behavior During Therapy [facility] for tasks assessed/performed               Past Medical History:  Diagnosis Date   Gestational diabetes    Gestational hypertension    Past Surgical History:  Procedure Laterality Date   CESAREAN SECTION N/A 10/26/2019   Procedure: CESAREAN SECTION;  Surgeon: Reva Bores, MD;  Location: [facility];  Service: Obstetrics;  Laterality: N/A;   MANDIBLE SURGERY     Patient Active Problem List   Diagnosis Date Noted   Miscarriage 11/11/2021   White matter abnormality on MRI of brain 07/06/2021   Abnormal computed tomography of paranasal sinus 07/06/2021   Vitamin D deficiency 11/11/2020   Iron deficiency 11/11/2020   Gestational diabetes    Gestational hypertension    Language barrier 10/12/2019    PCP: Philip Aspen, Minerva Ends   REFERRING PROVIDER: Drema Dallas, DO  REFERRING DIAG: M54.2 (ICD-10-CM) - Cervicalgia M54.16 (ICD-10-CM) - Left lumbar radiculopathy  Rationale for Evaluation and Treatment: Rehabilitation  THERAPY DIAG:  Cervicalgia  Left lumbar radiculopathy  ONSET DATE: 01/08/2022  SUBJECTIVE:                                                                                                                                                                                           SUBJECTIVE STATEMENT:  I felt ok after last session, my neck is better but the back is still a problem; there is shortness of breath when she is  laying on her back, sometime when I extend my legs this gets better but this isn't consistent. Hard to lay on either side this is new over the past couple days. Might have picked up something heavy that started this, sometimes hands get stiff too.   PERTINENT HISTORY:     NEUROLOGY FOLLOW UP OFFICE NOTE   Briseyda Jaquilla Woodroof [id number]   Assessment/Plan:    1  Atypical left sided headache/facial pain- description sounds cervicogenic but no structural abnormalities on cervical spine MRI.  MRI of brain  unrevealing as well 2  Left sided arm pain/numbness - nothing on MRI of cervical spine to explain symptoms 3  acute on chronic low back pain with left lumbar radiculopathy     Titrate gabapentin to 600mg  twice daily Refer to physical therapy for low back pain with lumbar radiculopathy NCV-EMG of left upper extremity Follow up 4-5 months. Patient inquired about checking another image of the brain since she hit her head and her headache/pain is worse.  I explained that I didn't think repeat imaging was warranted.  She is describing her chronic symptoms and her exam is objectively unremarkable.     Subjective:  Deborah Lawson is a 31 year old female who follows up for atypical facial pain.  She is accompanied by Arabic speaking interpreter   UPDATE: Due to ongoing cervical radiculitis, MRI of cervical spine was performed on 07/22/2021, which was personally reviewed and showed mild cervical spondylosis but no significant spinal canal or foraminal stenosis.  NCV-EMG of left upper extremity was ordered but appears to never have been performed.     CT sinuses on 09/21/2021 revealed resolution of previous left maxillary sinusitis.     She continues to have left sided occipital/facial burning pain and pain radiating down the left arm.  Also reports pain behind the eyes.  A couple of months ago, she hit her head and the pain is worse.   She has history of chronic low back pain radiating down the left leg.   This past week, she has worsening left leg pain with anterior numbness from the thigh down to the foot.  No trauma.   Current NSAIDS/analgesics:  ibuprofen Current triptans:  none Current ergotamine:  none Current anti-emetic:  none Current muscle relaxants:  none Current Antihypertensive medications:  none Current Antidepressant medications:  none Current Anticonvulsant medications:  gabapentin 300mg  QHS to twice daily Current anti-CGRP:  none Current Vitamins/Herbal/Supplements:  none Current Antihistamines/Decongestants:  none Other therapy:  none Hormone/birth control:  none   HISTORY: She started having a headache around January  No prior history of headache.  It is a moderate to severe left sided pressure/aching pain involving the left temple, eye, ear and face, including along the jaw and behind TMJ.  Sometimes with associated left sided/occipital numbness and tingling.  It comes and goes during the day the pain lessens and resolves by next day.  They occurs when she feels stressed or depressed, about 3 to 4 days a week.  Sometimes associated neck pain.  No associated nausea, vomiting, photophobia, phonophobia, visual disturbance, autonomic symptoms, numbness or weakness.  She went to the ED where she was diagnosed with a cavity.  She had the tooth pulled but pain persisted.  She was seen in the ED on 04/23/2020 where CT head personally reviewed was unremarkable and she was treated with headache cocktail.  She returned to the ED on 05/31/2020 for left sided TMJ pain where she was prescribed a prednisone taper and instructed to take Motrin as needed.    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  She was referred to endocrinology. She saw the dentist and had the cavity treated. Noted some improvement.  .  Referred to PT for neck/TMJ pain.  Still with neck pain radiating down left arm.  In March  2023, she was treated for left otitis media.  Still has left ear pain..  Treated for sinusitis and left  Engineer, [phone] MediEtter SKarAundra Millet Goldena26rrMontgomery VillageeJoelZOX:[id number](WJYeensrumblesMedicalCoal GroveMort Na Calvert [phone] 4Berenda MorHart RochestertefanLa S BAD XTTAG>ADTEXTTAG>[facility]77Aundra MilletKarin GoldenEtter [phone], [phone]8Barrister's clerk anetta BattyMort Sawyersry TavernEstelle Grumbles8[facility] [phone] CowdenCalvert CantorJanne [phone] Avena61[facility]16109Claiborne [phone] ChurchKentuckyKelli ChurnGalen ManilaShirline [phone] [place]  Hart RochesterBerenda [phone]75m4.7([id number]:WRUEAVW,UJWJXBJYJoellyn RuedCoralie KeensBrooksVa [facility]60Aundra MilletKarin GoldenEtter [phone], [phone]68Barrister's clerk Nanetta BattyMort SawyersEl CapitanEstelle Grumbles96[facility] [phone] CowdenCalvert CantorJanne [phone] Avena64[facility]16109Claiborne [phone] ChurchKentuckyKelli ChurnGalen ManilaShirline [phone]  Hart RochesterBerenda [phone]36m4.7([phone]:WRUEAVW,UJWJXBJYJoellyn RuedCoralie KeensClipper MillsOlean General Hospital29  Blank rows = not tested)  LOWER EXTREMITY MMT:    MMT Right eval Left eval 11/10 R 11/10 L  Hip flexion   4 4  Hip extension   3 4+  Hip abduction   4+ 4+  Hip adduction      Hip internal rotation      Hip external rotation      Knee flexion      Knee extension   5 5  Ankle  dorsiflexion   5 5  Ankle plantarflexion      Ankle inversion      Ankle eversion       (Blank rows = not tested)  UE strength: shoulder flexion R 4/5 L 5/5, ABD R 4/5 L 5/5 biceps 5/5 B triceps 5/5 B   LUMBAR SPECIAL TESTS:    FUNCTIONAL TESTS:    GAIT:   TODAY'S TREATMENT:                                                                                                                              DATE:   01/26/22  TherEx  HS stretches 2x30 seconds B  Piriformis stretches 2x30 seconds B  Sciatic flossing B supine TA sets seated x15 3 second holds  Hip hikes x10 R, x6-7 L pain limited  QL stretch R and L 2x20 seconds B  Standing marches with TA x10 B   01/22/22  TherEx  Chin tucks x12 3 second holds  UT stretches 3x15 seconds B Levator stretches 2x15 seconds B Doorway pec stretch 3x15 seconds  Scapular retractions red TB 1x15  Shoulder extensions red TB 1x10 Lat stretches 3x15 seconds B  Scap retractions + ER with yellow TB x10    Eval   Piriformis stretches 1x30 seconds B Thoracic extensions x10 Chin tucks x10         PATIENT EDUCATION:  Education details: exercise form and purpose Person educated: Patient Education method: Programmer, multimedia, Facilities manager, and Handouts Education comprehension: verbalized understanding and returned demonstration  HOME EXERCISE PROGRAM: [id number]  ASSESSMENT:  CLINICAL IMPRESSION:  Roan arrives today doing well, her neck is feeling much better but her back continues to be problematic, we focused on this today and she responded well. May have some sciatic tension along with mm tightness, addressed this today as well. Really seems to be responding well to exercise, will continue to progress as able.   OBJECTIVE IMPAIRMENTS: decreased ROM, decreased strength, hypomobility, increased fascial restrictions, increased muscle spasms, impaired flexibility, impaired sensation, improper body mechanics, postural dysfunction, and  pain.   ACTIVITY LIMITATIONS: carrying, lifting, transfers, bed mobility, toileting, dressing, reach over head, hygiene/grooming, and caring for others  PARTICIPATION LIMITATIONS: cleaning, laundry, driving, shopping, community activity, and occupation  PERSONAL FACTORS: Age, Behavior pattern, Education, Fitness, Social background, and Time since onset of injury/illness/exacerbation are also affecting patient's functional outcome.   REHAB POTENTIAL: Good  CLINICAL DECISION MAKING: Stable/uncomplicated  EVALUATION COMPLEXITY: Low   GOALS: Goals  reviewed with patient? No  SHORT TERM GOALS: Target date: 02/08/2022  Will be compliant with appropriate progressive HEP  Baseline: Goal status: INITIAL  2.  Pain in neck to be no more than 4/10 and 2/10 in back at worst  Baseline:  Goal status: INITIAL  3.  Will have better understanding of posture/biomechanics  Baseline:  Goal status: INITIAL  4.  Cervical flexion/extension ROM to be WNL  Baseline:  Goal status: INITIAL    LONG TERM GOALS: Target date: 03/01/2022  MMT to be assessed/appropriate LTG written  Baseline:  Goal status: INITIAL  2.  Will be able to complete all functional dressing/bathing tasks and child care without increase in pain  Baseline:  Goal status: INITIAL  3.  Will demonstrate good biomechanics for floor to waist height lifting  Baseline:  Goal status: INITIAL  4.  Will be able to drive and perform all household tasks/responsibilities without increase in pain Baseline:  Goal status: INITIAL   PLAN:  PT FREQUENCY: 2x/week  PT DURATION: 6 weeks  PLANNED INTERVENTIONS: Therapeutic exercises, Therapeutic activity, Neuromuscular re-education, Patient/Family education, Self Care, Manual therapy, and Re-evaluation.  PLAN FOR NEXT SESSION: postural training and strengthening, core/hip strengthening, general mobility for spine as a whole, biomechanics    Malena Timpone U PT DPT PN2  01/26/2022, 11:19  AM   Check all possible CPT codes: 01314 - PT Re-evaluation, 97110- Therapeutic Exercise, [phone]- Neuro Re-education, 97140 - Manual Therapy, 97530 - Therapeutic Activities, and 97535 - Self Care    Check all conditions that are expected to impact treatment: Musculoskeletal disorders and Social determinants of health   If treatment provided at initial evaluation, no treatment charged due to lack of authorization.

## 2022-01-28 ENCOUNTER — Encounter (HOSPITAL_COMMUNITY): Payer: Self-pay | Admitting: *Deleted

## 2022-01-28 ENCOUNTER — Ambulatory Visit: Payer: Medicaid Other | Admitting: Internal Medicine

## 2022-01-28 ENCOUNTER — Ambulatory Visit (HOSPITAL_COMMUNITY)
Admission: EM | Admit: 2022-01-28 | Discharge: 2022-01-28 | Disposition: A | Discharge status: Home / Self Care | Payer: Medicaid Other | Attending: Internal Medicine | Admitting: Internal Medicine

## 2022-01-28 DIAGNOSIS — L299 Pruritus, unspecified: Secondary | ICD-10-CM | POA: Diagnosis not present

## 2022-01-28 DIAGNOSIS — R21 Rash and other nonspecific skin eruption: Secondary | ICD-10-CM

## 2022-01-28 MED ORDER — HYDROXYZINE HCL 10 MG PO TABS: 10.0000 mg | ORAL_TABLET | Freq: Every evening | ORAL | 0 refills | Status: DC | PRN

## 2022-01-28 NOTE — ED Provider Notes (Signed)
[facility]    CSN: [id number] Arrival date & time: 01/28/22  1547      History   Chief Complaint Chief Complaint  Patient presents with   Rash    HPI Deborah Lawson is a 31 y.o. female.   Patient presents urgent care for evaluation of rash that started earlier today to the bilateral chest and upper back but disappeared after 10 to 15 minutes.  Rash consisted of some slight redness to the skin then resolved on its own quickly.  She is experiencing generalized itch to the body including the scalp.  Denies recent changes in soaps, bedding, laundry detergent, or recent exposure to new allergens/irritants.  No one else in the home is experiencing any similar symptoms.  No fever or chills, bug bites, nausea, vomiting, dizziness, or URI symptoms.  No attempted use of any over-the-counter medications prior to arrival urgent care for symptoms.   Rash   Past Medical History:  Diagnosis Date   Gestational diabetes    Gestational hypertension     Patient Active Problem List   Diagnosis Date Noted   Miscarriage 11/11/2021   White matter abnormality on MRI of brain 07/06/2021   Abnormal computed tomography of paranasal sinus 07/06/2021   Vitamin D deficiency 11/11/2020   Iron deficiency 11/11/2020   Gestational diabetes    Gestational hypertension    Language barrier 10/12/2019    Past Surgical History:  Procedure Laterality Date   CESAREAN SECTION N/A 10/26/2019   Procedure: CESAREAN SECTION;  Surgeon: Reva Bores, MD;  Location: [facility];  Service: Obstetrics;  Laterality: N/A;   MANDIBLE SURGERY      OB History     Gravida  4   Para  3   Term  3   Preterm  0   AB  0   Living  3      SAB  0   IAB  0   Ectopic  0   Multiple  0   Live Births  3            Home Medications    Prior to Admission medications   Medication Sig Start Date End Date Taking? Authorizing Provider  dicyclomine (BENTYL) 20 MG tablet Take 1 tablet (20 mg  total) by mouth 4 (four) times daily as needed (intestinal cramps). 01/11/22  Yes Zenia Resides, MD  gabapentin (NEURONTIN) 300 MG capsule Increase to 1 capsule in morning and 2 capsules at bedtime for one week, then increase to 2 capsules twice daily 01/08/22  Yes Jaffe, Adam R, DO  hydrOXYzine (ATARAX) 10 MG tablet Take 1 tablet (10 mg total) by mouth at bedtime as needed for itching. 01/28/22  Yes Carlisle Beers, FNP  ketoconazole (NIZORAL) 2 % shampoo APPLY 1 APPLICATION TOPICALLY 2 (TWO) TIMES A WEEK. 01/11/22  Yes Shamleffer, Konrad Dolores, MD  meclizine (ANTIVERT) 25 MG tablet Take 1 tablet (25 mg total) by mouth 3 (three) times daily as needed for dizziness (vertigo). 11/27/21  Yes Banister, Janace Aris, MD  Vitamin D, Ergocalciferol, (DRISDOL) 1.25 MG (50000 UNIT) CAPS capsule Take 1 capsule (50,000 Units total) by mouth every 7 (seven) days for 12 doses. 01/14/22 04/02/22 Yes Henderson Cloud, MD  NIFEdipine (PROCARDIA XL) 30 MG 24 hr tablet Take 1 tablet (30 mg total) by mouth daily. 11/05/19 02/18/20  Donette Larry, CNM    Family History Family History  Problem Relation Age of Onset   Hypertension Father  Social History Social History   Tobacco Use   Smoking status: Never    Passive exposure: Never   Smokeless tobacco: Never  Vaping Use   Vaping Use: Never used  Substance Use Topics   Alcohol use: Never   Drug use: Never     Allergies   Patient has no known allergies.   Review of Systems Review of Systems  Skin:  Positive for rash.  Per HPI   Physical Exam Triage Vital Signs ED Triage Vitals  Enc Vitals Group     BP 01/28/22 1747 120/86     Pulse Rate 01/28/22 1747 88     Resp 01/28/22 1747 18     Temp 01/28/22 1747 98.1 F (36.7 C)     Temp Source 01/28/22 1747 Oral     SpO2 01/28/22 1747 98 %     Weight --      Height --      Head Circumference --      Peak Flow --      Pain Score 01/28/22 1745 0     Pain Loc --      Pain  Edu? --      Excl. in GC? --    No data found.  Updated Vital Signs BP 120/86 (BP Location: Left Arm)   Pulse 88   Temp 98.1 F (36.7 C) (Oral)   Resp 18   LMP 01/02/2022   SpO2 98%   Breastfeeding No   Visual Acuity Right Eye Distance:   Left Eye Distance:   Bilateral Distance:    Right Eye Near:   Left Eye Near:    Bilateral Near:     Physical Exam Vitals and nursing note reviewed.  Constitutional:      Appearance: She is not ill-appearing or toxic-appearing.  HENT:     Head: Normocephalic and atraumatic.     Right Ear: Hearing and external ear normal.     Left Ear: Hearing and external ear normal.     Nose: Nose normal.     Mouth/Throat:     Lips: Pink.  Eyes:     General: Lids are normal. Vision grossly intact. Gaze aligned appropriately.     Extraocular Movements: Extraocular movements intact.     Conjunctiva/sclera: Conjunctivae normal.  Cardiovascular:     Rate and Rhythm: Normal rate and regular rhythm.     Heart sounds: Normal heart sounds, S1 normal and S2 normal.  Pulmonary:     Effort: Pulmonary effort is normal. No respiratory distress.     Breath sounds: Normal breath sounds and air entry.  Musculoskeletal:     Cervical back: Neck supple.  Skin:    General: Skin is warm and dry.     Capillary Refill: Capillary refill takes less than 2 seconds.     Findings: No erythema, lesion or rash.     Comments: No rashes present to the body.  Inspected the scalp as patient is experiencing pruritus to the scalp and there are no lesions appreciated to exam.  Neurological:     General: No focal deficit present.     Mental Status: She is alert and oriented to person, place, and time. Mental status is at baseline.     Cranial Nerves: No dysarthria or facial asymmetry.  Psychiatric:        Mood and Affect: Mood normal.        Speech: Speech normal.        Behavior: Behavior normal.  Thought Content: Thought content normal.        Judgment: Judgment  normal.      UC Treatments / Results  Labs (all labs ordered are listed, but only abnormal results are displayed) Labs Reviewed - No data to display  EKG   Radiology No results found.  Procedures Procedures (including critical care time)  Medications Ordered in UC Medications - No data to display  Initial Impression / Assessment and Plan / UC Course  I have reviewed the triage vital signs and the nursing notes.  Pertinent labs & imaging results that were available during my care of the patient were reviewed by me and considered in my medical decision making (see chart for details).   1.  Pruritus and rash Unclear etiology of rash from earlier today, although reassuring that this was self-limited and resolved on its own after 10 to 15 minutes.  Patient may take hydroxyzine 1 tablet at bedtime as needed for itching.  May follow-up with primary care for further evaluation of symptoms if they return or return to urgent care if symptoms fail to improve in the next 2 to 3 days with hydroxyzine.   Discussed physical exam and available lab work findings in clinic with patient.  Counseled patient regarding appropriate use of medications and potential side effects for all medications recommended or prescribed today. Discussed red flag signs and symptoms of worsening condition,when to call the PCP office, return to urgent care, and when to seek higher level of care in the emergency department. Patient verbalizes understanding and agreement with plan. All questions answered. Patient discharged in stable condition.    Final Clinical Impressions(s) / UC Diagnoses   Final diagnoses:  Pruritus  Rash     Discharge Instructions      You may take 1 tablet of hydroxyzine at bedtime as needed for itching.   Schedule a follow-up appointment with primary care for follow-up.  If you develop any new or worsening symptoms or do not improve in the next 2 to 3 days, please return.  If your  symptoms are severe, please go to the emergency room.  Follow-up with your primary care provider for further evaluation and management of your symptoms as well as ongoing wellness visits.  I hope you feel better!    ED Prescriptions     Medication Sig Dispense Auth. Provider   hydrOXYzine (ATARAX) 10 MG tablet Take 1 tablet (10 mg total) by mouth at bedtime as needed for itching. 20 tablet Carlisle Beers, FNP      PDMP not reviewed this encounter.   Carlisle Beers, [place] 01/28/22 Paulo Fruit

## 2022-01-28 NOTE — Discharge Instructions (Signed)
You may take 1 tablet of hydroxyzine at bedtime as needed for itching.   Schedule a follow-up appointment with primary care for follow-up.  If you develop any new or worsening symptoms or do not improve in the next 2 to 3 days, please return.  If your symptoms are severe, please go to the emergency room.  Follow-up with your primary care provider for further evaluation and management of your symptoms as well as ongoing wellness visits.  I hope you feel better!

## 2022-01-28 NOTE — ED Triage Notes (Signed)
Pt states she had a rash for 10-15 mins but now it is gone she states she can feel it a little still on the left side of her neck and chest. She states she is itchy on her legs and chest. She didn't take any meds.

## 2022-01-29 ENCOUNTER — Ambulatory Visit: Payer: Medicaid Other | Admitting: Physical Therapy

## 2022-01-29 ENCOUNTER — Encounter: Payer: Self-pay | Admitting: Physical Therapy

## 2022-01-29 DIAGNOSIS — M5416 Radiculopathy, lumbar region: Secondary | ICD-10-CM

## 2022-01-29 DIAGNOSIS — M542 Cervicalgia: Secondary | ICD-10-CM

## 2022-01-29 NOTE — Therapy (Signed)
OUTPATIENT PHYSICAL THERAPY THORACOLUMBAR TREATMENT   Patient Name: Deborah Lawson MRN: [id number] DOB:October 31, 1990, 31 y.o., female Today's Date: 01/29/2022   PT End of Session - 01/29/22 1048     Visit Number 4    Number of Visits 13    Date for PT Re-Evaluation 03/01/22    Authorization Type [employer]    Authorization Time Period 01/18/22 to 03/01/22    PT Start Time 1014    PT Stop Time 1058    PT Time Calculation (min) 44 min    Activity Tolerance Patient tolerated treatment well    Behavior During Therapy [facility] for tasks assessed/performed                Past Medical History:  Diagnosis Date   Gestational diabetes    Gestational hypertension    Past Surgical History:  Procedure Laterality Date   CESAREAN SECTION N/A 10/26/2019   Procedure: CESAREAN SECTION;  Surgeon: Deborah Jude, MD;  Location: [facility] LD ORS;  Service: Obstetrics;  Laterality: N/A;   MANDIBLE SURGERY     Patient Active Problem List   Diagnosis Date Noted   Miscarriage 11/11/2021   White matter abnormality on MRI of brain 07/06/2021   Abnormal computed tomography of paranasal sinus 07/06/2021   Vitamin D deficiency 11/11/2020   Iron deficiency 11/11/2020   Gestational diabetes    Gestational hypertension    Language barrier 10/12/2019    PCP: Deborah Lawson, Deborah Lawson   REFERRING PROVIDER: Pieter Partridge, DO  REFERRING DIAG: M54.2 (ICD-10-CM) - Cervicalgia M54.16 (ICD-10-CM) - Left lumbar radiculopathy  Rationale for Evaluation and Treatment: Rehabilitation  THERAPY DIAG:  Cervicalgia  Left lumbar radiculopathy  ONSET DATE: 01/08/2022  SUBJECTIVE:                                                                                                                                                                                           SUBJECTIVE STATEMENT:  Feeling good today, I feel better since last session; would like to focus on both neck and back today. Yesterday I had an  allergic reaction, urgent care gave me a medicine to try for it. When I have a headache neck pain is worse.   PERTINENT HISTORY:     NEUROLOGY FOLLOW UP OFFICE NOTE   Deborah Lawson [id number]   Assessment/Plan:    1  Atypical left sided headache/facial pain- description sounds cervicogenic but no structural abnormalities on cervical spine MRI.  MRI of brain unrevealing as well 2  Left sided arm pain/numbness - nothing on MRI of cervical spine to explain symptoms 3  acute on chronic low  back pain with left lumbar radiculopathy     Titrate gabapentin to 625m twice daily Refer to physical therapy for low back pain with lumbar radiculopathy NCV-EMG of left upper extremity Follow up 4-5 months. Patient inquired about checking another image of the brain since she hit her head and her headache/pain is worse.  I explained that I didn't think repeat imaging was warranted.  She is describing her chronic symptoms and her exam is objectively unremarkable.     Subjective:  [name] a 31year old female who follows up for atypical facial pain.  She is accompanied by Arabic speaking interpreter   UPDATE: Due to ongoing cervical radiculitis, MRI of cervical spine was performed on 07/22/2021, which was personally reviewed and showed mild cervical spondylosis but no significant spinal canal or foraminal stenosis.  NCV-EMG of left upper extremity was ordered but appears to never have been performed.     CT sinuses on 09/21/2021 revealed resolution of previous left maxillary sinusitis.     She continues to have left sided occipital/facial burning pain and pain radiating down the left arm.  Also reports pain behind the eyes.  A couple of months ago, she hit her head and the pain is worse.   She has history of chronic low back pain radiating down the left leg.  This past week, she has worsening left leg pain with anterior numbness from the thigh down to the foot.  No trauma.   Current  NSAIDS/analgesics:  ibuprofen Current triptans:  none Current ergotamine:  none Current anti-emetic:  none Current muscle relaxants:  none Current Antihypertensive medications:  none Current Antidepressant medications:  none Current Anticonvulsant medications:  gabapentin [id number] to twice daily Current anti-CGRP:  none Current Vitamins/Herbal/Supplements:  none Current Antihistamines/Decongestants:  none Other therapy:  none Hormone/birth control:  none   HISTORY: She started having a headache around January  No prior history of headache.  It is a moderate to severe left sided pressure/aching pain involving the left temple, eye, ear and face, including along the jaw and behind TMJ.  Sometimes with associated left sided/occipital numbness and tingling.  It comes and goes during the day the pain lessens and resolves by next day.  They occurs when she feels stressed or depressed, about 3 to 4 days a week.  Sometimes associated neck pain.  No associated nausea, vomiting, photophobia, phonophobia, visual disturbance, autonomic symptoms, numbness or weakness.  She went to the ED where she was diagnosed with a cavity.  She had the tooth pulled but pain persisted.  She was seen in the ED on 04/23/2020 where CT head personally reviewed was unremarkable and she was treated with headache cocktail.  She returned to the ED on 05/31/2020 for left sided TMJ pain where she was prescribed a prednisone taper and instructed to take Motrin as needed.    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  She was referred to endocrinology. She saw the dentist and had the cavity treated. Noted some improvement.  .  Referred to PT for neck/TMJ pain.  Still with neck pain radiating down left arm.  In March 2023, she was treated for left otitis media.  Still has left ear pain..  Treated for sinusitis and left otitis media.  Still with  pain in left ear.  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  She saw endocrinology regarding the pituitary microadenoma with normal workup.      PAIN:  Are you having pain? Yes: NPRS scale: 5/10 low back and neck  Pain location: neck and low back  Pain description: pressure on the neck, pressure/tightness in the back Aggravating factors: trying to pick up something heavy  Relieving factors: exercises/HEP   PRECAUTIONS: None  WEIGHT BEARING RESTRICTIONS: No  FALLS:  Has patient fallen in last 6 months? No  LIVING ENVIRONMENT: Lives with: lives with their family Lives in: House/apartment Stairs: no steps, 1st floor apt  Has following equipment at home: None  OCCUPATION: none   PLOF: Independent and Independent with basic ADLs  PATIENT GOALS: wondering if exercise and PT can help leg/neck pain   NEXT MD VISIT: March with Dr. JTomi Likens  OBJECTIVE:   DIAGNOSTIC FINDINGS:  IMPRESSION: Mild cervical spondylosis, as outlined. No more than mild disc degeneration. Multilevel shallow disc bulges. No significant spinal canal or foraminal stenosis.   Nonspecific reversal of the expected cervical lordosis.   No signal abnormality identified within the cervical spinal cord.   8 mm well-circumscribed T2 hyperintense focus in the region of the left palatine tonsil, which may reflect a tonsillar cyst.  IMPRESSION: 2-3 mm T2 hyperintense and transiently hypoenhancing lesion within the left inferior aspect of the pituitary gland, as described and compatible with the provided history of pituitary microdenoma.   There are a few small nonspecific T2 FLAIR hyperintense remote insults scattered within the cerebral white matter (measuring up to 3 mm).   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    Otherwise unremarkable MRI appearance of the brain.   Paranasal sinus disease, as described  PATIENT SURVEYS:   NDI 26/50  SCREENING FOR RED FLAGS: Bowel or bladder incontinence: No Spinal tumors: No Cauda equina syndrome: No Compression fracture: No Abdominal aneurysm: No  COGNITION: Overall cognitive status: Within functional limits for tasks assessed     SENSATION: Not tested  MUSCLE LENGTH:  Mild HS tightness B, moderate piriformis tightness B but L>R   POSTURE: rounded shoulders, forward head, increased lumbar lordosis, and increased thoracic kyphosis  PALPATION: Significant trigger points noted B upper traps, also B rotator cuffs, L thoracic paraspinal spasms   LUMBAR ROM:   AROM eval  Flexion WNL, RFIS increased back pain   Extension Mild-moderate limitation; REIS some pain, improved baseline pain   Right lateral flexion WNL   Left lateral flexion WNL   Right rotation   Left rotation    (Blank rows = not tested)  Cervical ROM: flexion 42* L pain, extension 31 B pain, lateral flexion WNL increased pain with R sidebend, cervical rotation WNL but pain L side 11/17  cervical flexion/extension WNL   Thoracic ROM: flexion WNL, extension moderate limitation L shoulder pain, WNL but stretching L, rotation WNL but stretching pain L   LOWER EXTREMITY ROM:     Active  Right eval Left eval  Hip flexion    Hip extension    Hip abduction    Hip adduction    Hip internal rotation    Hip external rotation    Knee flexion    Knee extension    Ankle dorsiflexion    Ankle plantarflexion    Ankle inversion    Ankle eversion     (Blank rows = not tested)  LOWER EXTREMITY MMT:  MMT Right eval Left eval 11/10 R 11/10 L  Hip flexion   4 4  Hip extension   3 4+  Hip abduction   4+ 4+  Hip adduction      Hip internal rotation      Hip external rotation      Knee flexion      Knee extension   5 5  Ankle dorsiflexion   5 5  Ankle plantarflexion      Ankle  inversion      Ankle eversion       (Blank rows = not tested)  UE strength: shoulder flexion R 4/5 L 5/5, ABD R 4/5 L 5/5 biceps 5/5 B triceps 5/5 B   LUMBAR SPECIAL TESTS:    FUNCTIONAL TESTS:    GAIT:   TODAY'S TREATMENT:                                                                                                                              DATE:    01/29/22  STG check + appropriate education   TherEx  Chin tucks x15  Chin tucks with rotation x10  Chin tucks with lateral flexion x10 Scap retractions x10 Seated star gazer stretch 3x30 seconds Biomechanics training floor to waist- needs more practice [id number] 3 second holds   01/26/22  TherEx  HS stretches 2x30 seconds B  Piriformis stretches 2x30 seconds B  Sciatic flossing B supine TA sets seated x15 3 second holds  Hip hikes x10 R, x6-7 L pain limited  QL stretch R and L 2x20 seconds B  Standing marches with TA x10 B   01/22/22  TherEx  Chin tucks x12 3 second holds  UT stretches 3x15 seconds B Levator stretches 2x15 seconds B Doorway pec stretch 3x15 seconds  Scapular retractions red TB 1x15  Shoulder extensions red TB 1x10 Lat stretches 3x15 seconds B  Scap retractions + ER with yellow TB x10    Eval   Piriformis stretches 1x30 seconds B Thoracic extensions x10 Chin tucks x10         PATIENT EDUCATION:  Education details: exercise form and purpose, recommended memory foam pillow (bamboo brand) to assist in neck pain management, biomechanics  Person educated: Patient Education method: Explanation, Demonstration, and Handouts Education comprehension: verbalized understanding and returned demonstration  HOME EXERCISE PROGRAM: [id number]  ASSESSMENT:  CLINICAL IMPRESSION:  Deborah Lawson arrives today feeling better, but had increased pain in her neck and back today requested we work on both today. Spent time working on both areas today, pecs are staying very tight so we spent more time  stretching these today as well. Core still very weak, we also spent time working on general biomechanics needs a lot more practice with this with items of different shapes and weights.    OBJECTIVE IMPAIRMENTS: decreased ROM, decreased strength, hypomobility, increased fascial restrictions, increased muscle spasms, impaired flexibility, impaired sensation, improper body mechanics, postural dysfunction, and pain.  ACTIVITY LIMITATIONS: carrying, lifting, transfers, bed mobility, toileting, dressing, reach over head, hygiene/grooming, and caring for others  PARTICIPATION LIMITATIONS: cleaning, laundry, driving, shopping, community activity, and occupation  PERSONAL FACTORS: Age, Behavior pattern, Education, Fitness, Social background, and Time since onset of injury/illness/exacerbation are also affecting patient's functional outcome.   REHAB POTENTIAL: Good  CLINICAL DECISION MAKING: Stable/uncomplicated  EVALUATION COMPLEXITY: Low   GOALS: Goals reviewed with patient? No  SHORT TERM GOALS: Target date: 02/08/2022  Will be compliant with appropriate progressive HEP  Baseline: Goal status: MET  2.  Pain in neck to be no more than 4/10 and 2/10 in back at worst  Baseline:  Goal status: IN PROGRESS 11/15 5/10  3.  Will have better understanding of posture/biomechanics  Baseline:  Goal status: IN PROGRESS 11/15- needs ongoing training   4.  Cervical flexion/extension ROM to be WNL  Baseline:  Goal status: MET11/15 full ROM    LONG TERM GOALS: Target date: 03/01/2022  MMT to be assessed/appropriate LTG written  Baseline:  Goal status: INITIAL  2.  Will be able to complete all functional dressing/bathing tasks and child care without increase in pain  Baseline:  Goal status: INITIAL  3.  Will demonstrate good biomechanics for floor to waist height lifting  Baseline:  Goal status: INITIAL  4.  Will be able to drive and perform all household tasks/responsibilities without  increase in pain Baseline:  Goal status: INITIAL   PLAN:  PT FREQUENCY: 2x/week  PT DURATION: 6 weeks  PLANNED INTERVENTIONS: Therapeutic exercises, Therapeutic activity, Neuromuscular re-education, Patient/Family education, Self Care, Manual therapy, and Re-evaluation.  PLAN FOR NEXT SESSION: postural training and strengthening, core/hip strengthening, general mobility for spine as a whole, increase focus on biomechanics training    Kurt Azimi U PT DPT PN2  01/29/2022, 11:06 AM   Check all possible CPT codes: 62130 - PT Re-evaluation, 97110- Therapeutic Exercise, [phone]- Neuro Re-education, 97140 - Manual Therapy, 97530 - Therapeutic Activities, and 97535 - Self Care    Check all conditions that are expected to impact treatment: Musculoskeletal disorders and Social determinants of health   If treatment provided at initial evaluation, no treatment charged due to lack of authorization.

## 2022-02-08 ENCOUNTER — Ambulatory Visit: Payer: Medicaid Other | Admitting: Physical Therapy

## 2022-02-08 ENCOUNTER — Encounter: Payer: Self-pay | Admitting: Physical Therapy

## 2022-02-08 DIAGNOSIS — M26609 Unspecified temporomandibular joint disorder, unspecified side: Secondary | ICD-10-CM

## 2022-02-08 DIAGNOSIS — M5416 Radiculopathy, lumbar region: Secondary | ICD-10-CM

## 2022-02-08 DIAGNOSIS — M542 Cervicalgia: Secondary | ICD-10-CM

## 2022-02-08 NOTE — Therapy (Signed)
OUTPATIENT PHYSICAL THERAPY THORACOLUMBAR TREATMENT   Patient Name: Deborah Lawson MRN: [id number] DOB:04/17/1990, 31 y.o., female Today's Date: 02/08/2022   PT End of Session - 02/08/22 1024     Visit Number 5    Number of Visits 13    Date for PT Re-Evaluation 03/01/22    Authorization Type [employer]    Authorization Time Period 01/18/22 to 03/01/22    PT Start Time 1019   pt late   PT Stop Time 1057    PT Time Calculation (min) 38 min    Activity Tolerance Patient tolerated treatment well    Behavior During Therapy WFL for tasks assessed/performed                Past Medical History:  Diagnosis Date   Gestational diabetes    Gestational hypertension    Past Surgical History:  Procedure Laterality Date   CESAREAN SECTION N/A 10/26/2019   Procedure: CESAREAN SECTION;  Surgeon: Pratt, Tanya S, MD;  Location: [facility];  Service: Obstetrics;  Laterality: N/A;   MANDIBLE SURGERY     Patient Active Problem List   Diagnosis Date Noted   Miscarriage 11/11/2021   White matter abnormality on MRI of brain 07/06/2021   Abnormal computed tomography of paranasal sinus 07/06/2021   Vitamin D deficiency 11/11/2020   Iron deficiency 11/11/2020   Gestational diabetes    Gestational hypertension    Language barrier 10/12/2019    PCP: Hernandez Acosta, Estela   REFERRING PROVIDER: Jaffe, Adam R, DO  REFERRING DIAG: M54.2 (ICD-10-CM) - Cervicalgia M54.16 (ICD-10-CM) - Left lumbar radiculopathy  Rationale for Evaluation and Treatment: Rehabilitation  THERAPY DIAG:  Cervicalgia  Left lumbar radiculopathy  TMJ dysfunction  ONSET DATE: 01/08/2022  SUBJECTIVE:                                                                                                                                                                                           SUBJECTIVE STATEMENT:  Pt presents without in-person interpreter today, app interpreter Siham #[id number].  Pt reports she  is having right jaw pain related to a toothache that she is scheduled to see the dentist about today.  She states she is having back pain.   PERTINENT HISTORY:     NEUROLOGY FOLLOW UP OFFICE NOTE   Corrin Motsaem Layton [id number]   Assessment/Plan:    1  Atypical left sided headache/facial pain- description sounds cervicogenic but no structural abnormalities on cervical spine MRI.  MRI of brain unrevealing as well 2  Left sided arm pain/numbness - nothing on MRI of cervical spine to explain symptoms 3  acute   on chronic low back pain with left lumbar radiculopathy     Titrate gabapentin to 600mg twice daily Refer to physical therapy for low back pain with lumbar radiculopathy NCV-EMG of left upper extremity Follow up 4-5 months. Patient inquired about checking another image of the brain since she hit her head and her headache/pain is worse.  I explained that I didn't think repeat imaging was warranted.  She is describing her chronic symptoms and her exam is objectively unremarkable.     Subjective:  Deborah Lawson is a 31 year old female who follows up for atypical facial pain.  She is accompanied by Arabic speaking interpreter   UPDATE: Due to ongoing cervical radiculitis, MRI of cervical spine was performed on 07/22/2021, which was personally reviewed and showed mild cervical spondylosis but no significant spinal canal or foraminal stenosis.  NCV-EMG of left upper extremity was ordered but appears to never have been performed.     CT sinuses on 09/21/2021 revealed resolution of previous left maxillary sinusitis.     She continues to have left sided occipital/facial burning pain and pain radiating down the left arm.  Also reports pain behind the eyes.  A couple of months ago, she hit her head and the pain is worse.   She has history of chronic low back pain radiating down the left leg.  This past week, she has worsening left leg pain with anterior numbness from the thigh down to the foot.  No  trauma.   Current NSAIDS/analgesics:  ibuprofen Current triptans:  none Current ergotamine:  none Current anti-emetic:  none Current muscle relaxants:  none Current Antihypertensive medications:  none Current Antidepressant medications:  none Current Anticonvulsant medications:  gabapentin 300mg QHS to twice daily Current anti-CGRP:  none Current Vitamins/Herbal/Supplements:  none Current Antihistamines/Decongestants:  none Other therapy:  none Hormone/birth control:  none   HISTORY: She started having a headache around January  No prior history of headache.  It is a moderate to severe left sided pressure/aching pain involving the left temple, eye, ear and face, including along the jaw and behind TMJ.  Sometimes with associated left sided/occipital numbness and tingling.  It comes and goes during the day the pain lessens and resolves by next day.  They occurs when she feels stressed or depressed, about 3 to 4 days a week.  Sometimes associated neck pain.  No associated nausea, vomiting, photophobia, phonophobia, visual disturbance, autonomic symptoms, numbness or weakness.  She went to the ED where she was diagnosed with a cavity.  She had the tooth pulled but pain persisted.  She was seen in the ED on 04/23/2020 where CT head personally reviewed was unremarkable and she was treated with headache cocktail.  She returned to the ED on 05/31/2020 for left sided TMJ pain where she was prescribed a prednisone taper and instructed to take Motrin as needed.    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  She was referred to endocrinology. She saw the dentist and had the cavity treated. Noted some improvement.  .  Referred to PT for neck/TMJ pain.  Still with neck pain radiating down left arm.  In March 2023, she was treated for left otitis media.  Still has left ear pain..  Treated for sinusitis and left otitis  media.  Still with pain in left ear.  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  She saw endocrinology regarding the pituitary microadenoma with normal workup.      PAIN:  Are you having pain? Yes: NPRS scale: 6/10 low back and neck  Pain location: low back  Pain description: tightness Aggravating factors: picking up clothes  Relieving factors: exercises/HEP   PRECAUTIONS: None  WEIGHT BEARING RESTRICTIONS: No  FALLS:  Has patient fallen in last 6 months? No  OBJECTIVE:   TODAY'S TREATMENT:                                                                                                                              -Floor to waist empty crate transfer x10 > 10lb plate added to crate x10, pt tends to only bend the right knee when lifting/lowering, cued to improve squatting form w/ pt tending to squat to floor vs gentle squat form, pt maintains rounded thoracic spine throughout -10lb box carry 230', corrected from low carry form to midwaist carry to protect back and shoulders -BOSU surge 15lb bimanual high carry x230' -10lb kettlebell farmer's carry x230' each UE -D1/D2 bean bag overhead lifts w/ pt endorsing normal strain on posterior shoulder musculature.   PATIENT EDUCATION:  Education details: Explained lifting form to pt as she is concerned about continuing to lift objects at home.  Explained deficits noted and needed corrections to practice with lifting ADLs at home. Person educated: Patient Education method: Explanation, Demonstration, and Handouts Education comprehension: verbalized understanding and returned demonstration  HOME EXERCISE PROGRAM: [id number]  ASSESSMENT:  CLINICAL IMPRESSION:  Primary focus of session on biomechanics progressing from floor to overhead heights.  Edu on various aspects of lifting posture  and normal vs abnormal musculature response.  Pt generally uses poor form with lifting from floor heights despite corrections and would benefit from further practice in coming sessions.    OBJECTIVE IMPAIRMENTS: decreased ROM, decreased strength, hypomobility, increased fascial restrictions, increased muscle spasms, impaired flexibility, impaired sensation, improper body mechanics, postural dysfunction, and pain.   ACTIVITY LIMITATIONS: carrying, lifting, transfers, bed mobility, toileting, dressing, reach over head, hygiene/grooming, and caring for others  PARTICIPATION LIMITATIONS: cleaning, laundry, driving, shopping, community activity, and occupation  PERSONAL FACTORS: Age, Behavior pattern, Education, Fitness, Social background, and Time since onset of injury/illness/exacerbation are also affecting patient's functional outcome.   REHAB POTENTIAL: Good  CLINICAL DECISION MAKING: Stable/uncomplicated  EVALUATION COMPLEXITY: Low   GOALS: Goals reviewed with patient? No  SHORT TERM GOALS: Target date: 02/08/2022  Will be compliant with appropriate progressive HEP  Baseline: Goal status: MET  2.  Pain in neck to be no more than 4/10 and 2/10 in back at worst  Baseline:  Goal status: IN PROGRESS 11/15 5/10  3.  Will have better understanding of posture/biomechanics  Baseline:  Goal status: IN PROGRESS 11/15- needs ongoing training   4.  Cervical flexion/extension ROM to be WNL    Baseline:  Goal status: MET11/15 full ROM    LONG TERM GOALS: Target date: 03/01/2022  MMT to be assessed/appropriate LTG written  Baseline:  Goal status: INITIAL  2.  Will be able to complete all functional dressing/bathing tasks and child care without increase in pain  Baseline:  Goal status: INITIAL  3.  Will demonstrate good biomechanics for floor to waist height lifting  Baseline:  Goal status: INITIAL  4.  Will be able to drive and perform all household tasks/responsibilities  without increase in pain Baseline:  Goal status: INITIAL   PLAN:  PT FREQUENCY: 2x/week  PT DURATION: 6 weeks  PLANNED INTERVENTIONS: Therapeutic exercises, Therapeutic activity, Neuromuscular re-education, Patient/Family education, Self Care, Manual therapy, and Re-evaluation.  PLAN FOR NEXT SESSION: postural training and strengthening, core/hip strengthening, general mobility for spine as a whole, continue focus on biomechanics training - edu on lifting form, can practice child carry if child w/ pt as this may be contributing to low back strain.   Marissa Varnadore, PT, DPT 02/08/2022, 11:02 AM   Check all possible CPT codes: 97164 - PT Re-evaluation, 97110- Therapeutic Exercise, 97112- Neuro Re-education, 97140 - Manual Therapy, 97530 - Therapeutic Activities, and 97535 - Self Care    Check all conditions that are expected to impact treatment: Musculoskeletal disorders and Social determinants of health   If treatment provided at initial evaluation, no treatment charged due to lack of authorization.    

## 2022-02-09 ENCOUNTER — Ambulatory Visit (INDEPENDENT_AMBULATORY_CARE_PROVIDER_SITE_OTHER): Payer: Medicaid Other | Admitting: Internal Medicine

## 2022-02-09 VITALS — BP 137/82 | HR 97 | Temp 98.4°F | Wt 201.6 lb

## 2022-02-09 DIAGNOSIS — M542 Cervicalgia: Secondary | ICD-10-CM

## 2022-02-09 MED ORDER — KETOROLAC TROMETHAMINE 60 MG/2ML IM SOLN
60.0000 mg | Freq: Once | INTRAMUSCULAR | Status: AC
  Administered 2022-02-09: 60 mg via INTRAMUSCULAR

## 2022-02-09 NOTE — Progress Notes (Signed)
Established Patient Office Visit     CC/Reason for Visit: Right-sided neck and shoulder pain  HPI: Deborah Lawson is a 31 y.o. female who is coming in today for the above mentioned reasons.  Has been having cervicalgia now for some time.  She had a C-spine MRI and has been followed by neurology and is attending PT regularly.  Few days ago she lifted up a heavy load of laundry and immediately felt right-sided neck, trapezius and right shoulder pain.  It has been aching ever since.   Past Medical/Surgical History: Past Medical History:  Diagnosis Date   Gestational diabetes    Gestational hypertension     Past Surgical History:  Procedure Laterality Date   CESAREAN SECTION N/A 10/26/2019   Procedure: CESAREAN SECTION;  Surgeon: Reva Bores, MD;  Location: [facility]S;  Service: Obstetrics;  Laterality: N/A;   MANDIBLE SURGERY      Social History:  reports that she has never smoked. She has never been exposed to tobacco smoke. She has never used smokeless tobacco. She reports that she does not drink alcohol and does not use drugs.  Allergies: No Known Allergies  Family History:  Family History  Problem Relation Age of Onset   Hypertension Father      Current Outpatient Medications:    dicyclomine (BENTYL) 20 MG tablet, Take 1 tablet (20 mg total) by mouth 4 (four) times daily as needed (intestinal cramps)., Disp: 40 tablet, Rfl: 0   gabapentin (NEURONTIN) 300 MG capsule, Increase to 1 capsule in morning and 2 capsules at bedtime for one week, then increase to 2 capsules twice daily, Disp: 120 capsule, Rfl: 0   hydrOXYzine (ATARAX) 10 MG tablet, Take 1 tablet (10 mg total) by mouth at bedtime as needed for itching., Disp: 20 tablet, Rfl: 0   ketoconazole (NIZORAL) 2 % shampoo, APPLY 1 APPLICATION TOPICALLY 2 (TWO) TIMES A WEEK., Disp: 120 mL, Rfl: 0   meclizine (ANTIVERT) 25 MG tablet, Take 1 tablet (25 mg total) by mouth 3 (three) times daily as needed for dizziness  (vertigo)., Disp: 30 tablet, Rfl: 0   Vitamin D, Ergocalciferol, (DRISDOL) 1.25 MG (50000 UNIT) CAPS capsule, Take 1 capsule (50,000 Units total) by mouth every 7 (seven) days for 12 doses., Disp: 12 capsule, Rfl: 0  Review of Systems:  Constitutional: Denies fever, chills, diaphoresis, appetite change and fatigue.  HEENT: Denies photophobia, eye pain, redness, hearing loss, ear pain, congestion, sore throat, rhinorrhea, sneezing, mouth sores, trouble swallowing, neck pain, neck stiffness and tinnitus.   Respiratory: Denies SOB, DOE, cough, chest tightness,  and wheezing.   Cardiovascular: Denies chest pain, palpitations and leg swelling.  Gastrointestinal: Denies nausea, vomiting, abdominal pain, diarrhea, constipation, blood in stool and abdominal distention.  Genitourinary: Denies dysuria, urgency, frequency, hematuria, flank pain and difficulty urinating.  Endocrine: Denies: hot or cold intolerance, sweats, changes in hair or nails, polyuria, polydipsia. Musculoskeletal: Denies  back pain, joint swelling, arthralgias and gait problem.  Skin: Denies pallor, rash and wound.  Neurological: Denies dizziness, seizures, syncope, weakness, light-headedness, numbness and headaches.  Hematological: Denies adenopathy. Easy bruising, personal or family bleeding history  Psychiatric/Behavioral: Denies suicidal ideation, mood changes, confusion, nervousness, sleep disturbance and agitation    Physical Exam: Vitals:   02/09/22 1346 02/09/22 1348  BP: (!) 140/90 137/82  Pulse: 97   Temp: 98.4 F (36.9 C)   TempSrc: Oral   SpO2: 100%   Weight: 201 lb 9.6 oz (91.4 kg)  Body mass index is 33.55 kg/m.   Constitutional: NAD, calm, comfortable Eyes: PERRL, lids and conjunctivae normal ENMT: Mucous membranes are moist. Posterior pharynx is erythematous but clear of any exudate or lesions.  Neck: normal, supple, no masses, no thyromegaly Respiratory: clear to auscultation bilaterally, no  wheezing, no crackles. Normal respiratory effort. No accessory muscle use.  Cardiovascular: Regular rate and rhythm, no murmurs / rubs / gallops. No extremity edema.   Psychiatric: Normal judgment and insight. Alert and oriented x 3. Normal mood.    Impression and Plan:  Neck pain  -Sounds muscular in origin.  Have advised icing, massage, as needed NSAIDs. -Will give Toradol IM 60 mg in office today.  Time spent:33 minutes reviewing chart, interviewing and examining patient and formulating plan of care.     Lelon Frohlich, MD [facility]

## 2022-02-11 ENCOUNTER — Ambulatory Visit: Payer: Medicaid Other | Admitting: Neurology

## 2022-02-11 ENCOUNTER — Telehealth: Payer: Self-pay | Admitting: Internal Medicine

## 2022-02-11 DIAGNOSIS — R200 Anesthesia of skin: Secondary | ICD-10-CM | POA: Diagnosis not present

## 2022-02-11 DIAGNOSIS — R202 Paresthesia of skin: Secondary | ICD-10-CM

## 2022-02-11 NOTE — Telephone Encounter (Signed)
Pt is calling and would like a referral to dr Everlena Cooper at [facility] neurologist for left leg pain

## 2022-02-11 NOTE — Procedures (Signed)
[facility]  [address] [place], [address]  [place], [place] [address] Tel: [phone] Fax:  [phone] Test Date:  02/11/2022  Patient: Deborah Lawson DOB: 03/17/1960 Physician: Roxana Hires Charee Tumblin,DO  Sex: Female Height: 5\' 5"  Ref Phys: , D.O.  ID#: Shon Millet   Technician:    Patient Complaints: This is a 31 year old female referred for evaluation of left-sided neck and arm pain.  NCV & EMG Findings: Extensive electrodiagnostic testing of the left upper extremity shows: Left median, ulnar, and mixed palmar sensory responses are within normal limits. Left median and ulnar motor responses are within normal limits. There is no evidence of active or chronic motor axonal loss changes affecting any of the tested muscles.  Motor unit configuration and recruitment pattern is within normal limits.   Impression: This is a normal study of the left upper extremity.  In particular, there is no evidence of a cervical radiculopathy, carpal tunnel syndrome, or ulnar neuropathy.   ___________________________ 37 Maha Fischel,DO    Nerve Conduction Studies Anti Sensory Summary Table   Stim Site NR Peak (ms) Norm Peak (ms) O-P Amp (V) Norm O-P Amp  Left Median Anti Sensory (2nd Digit)  33C  Wrist    2.6 <3.6 82.1 >15  Left Ulnar Anti Sensory (5th Digit)  33C  Wrist    2.4 <3.1 63.8 >10   Motor Summary Table   Stim Site NR Onset (ms) Norm Onset (ms) O-P Amp (mV) Norm O-P Amp Site1 Site2 Delta-0 (ms) Dist (cm) Vel (m/s) Norm Vel (m/s)  Left Median Motor (Abd Poll Brev)  33C  Wrist    2.5 <4.0 15.4 >6 Elbow Wrist 4.3 28.0 65 >50  Elbow    6.8  15.0         Left Ulnar Motor (Abd Dig Minimi)  33C  Wrist    2.2 <3.1 11.0 >7 B Elbow Wrist 3.1 21.0 68 >50  B Elbow    5.3  10.5  A Elbow B Elbow 1.6 10.0 62 >50  A Elbow    6.9  10.6          Comparison Summary Table   Stim Site NR Peak (ms) Norm Peak (ms) P-T Amp (V) Site1 Site2 Delta-P (ms) Norm Delta (ms)  Left Median/Ulnar  Palm Comparison (Wrist - 8cm)  33C  Median Palm    1.4 <2.2 119.6 Median Palm Ulnar Palm 0.1   Ulnar Palm    1.3 <2.2 46.5       EMG   Side Muscle Ins Act Fibs Fasc Recrt Dur. Amp. Poly. Activation Comment  Left 1stDorInt Nml Nml Nml Nml Nml Nml Nml Nml N/A  Left PronatorTeres Nml Nml Nml Nml Nml Nml Nml Nml N/A  Left Biceps Nml Nml Nml Nml Nml Nml Nml Nml N/A  Left Triceps Nml Nml Nml Nml Nml Nml Nml Nml N/A  Left Deltoid Nml Nml Nml Nml Nml Nml Nml Nml N/A      Waveforms:

## 2022-02-12 ENCOUNTER — Ambulatory Visit: Payer: Medicaid Other | Attending: Neurology | Admitting: Physical Therapy

## 2022-02-12 ENCOUNTER — Encounter: Payer: Self-pay | Admitting: Physical Therapy

## 2022-02-12 DIAGNOSIS — M5416 Radiculopathy, lumbar region: Secondary | ICD-10-CM | POA: Diagnosis present

## 2022-02-12 DIAGNOSIS — M542 Cervicalgia: Secondary | ICD-10-CM | POA: Insufficient documentation

## 2022-02-12 DIAGNOSIS — M26609 Unspecified temporomandibular joint disorder, unspecified side: Secondary | ICD-10-CM | POA: Diagnosis present

## 2022-02-12 NOTE — Therapy (Signed)
OUTPATIENT PHYSICAL THERAPY THORACOLUMBAR TREATMENT   Patient Name: Deborah Lawson MRN: [id number] DOB:01/02/1991, 31 y.o., female Today's Date: 02/12/2022   PT End of Session - 02/12/22 1012     Visit Number 6    Number of Visits 13    Date for PT Re-Evaluation 03/01/22    Authorization Type [employer] Medicaid    Authorization Time Period 01/18/22 to 03/01/22    PT Start Time 1010   Pt agreeable to be taken back early   PT Stop Time 1054    PT Time Calculation (min) 44 min    Activity Tolerance Patient tolerated treatment well    Behavior During Therapy [facility] for tasks assessed/performed            Past Medical History:  Diagnosis Date   Gestational diabetes    Gestational hypertension    Past Surgical History:  Procedure Laterality Date   CESAREAN SECTION N/A 10/26/2019   Procedure: CESAREAN SECTION;  Surgeon: Donnamae Jude, MD;  Location: [facility] LD ORS;  Service: Obstetrics;  Laterality: N/A;   MANDIBLE SURGERY     Patient Active Problem List   Diagnosis Date Noted   Miscarriage 11/11/2021   White matter abnormality on MRI of brain 07/06/2021   Abnormal computed tomography of paranasal sinus 07/06/2021   Vitamin D deficiency 11/11/2020   Iron deficiency 11/11/2020   Gestational diabetes    Gestational hypertension    Language barrier 10/12/2019    PCP: Isaac Bliss, Olam Idler   REFERRING PROVIDER: Pieter Partridge, DO  REFERRING DIAG: M54.2 (ICD-10-CM) - Cervicalgia M54.16 (ICD-10-CM) - Left lumbar radiculopathy  Rationale for Evaluation and Treatment: Rehabilitation  THERAPY DIAG:  Cervicalgia  Left lumbar radiculopathy  ONSET DATE: 01/08/2022  SUBJECTIVE:                                                                                                                                                                                           SUBJECTIVE STATEMENT:  Pt presents without in-person interpreter initially, app interpreter [place] [phone].  In-person  interpreter joins session at 10:16, Hanan.  She is having a little pain in her left shoulder today, but no other changes.  PERTINENT HISTORY:     NEUROLOGY FOLLOW UP OFFICE NOTE   Deborah Lawson [id number]   Assessment/Plan:    1  Atypical left sided headache/facial pain- description sounds cervicogenic but no structural abnormalities on cervical spine MRI.  MRI of brain unrevealing as well 2  Left sided arm pain/numbness - nothing on MRI of cervical spine to explain symptoms 3  acute on chronic low back pain with left lumbar radiculopathy  Titrate gabapentin to 621m twice daily Refer to physical therapy for low back pain with lumbar radiculopathy NCV-EMG of left upper extremity Follow up 4-5 months. Patient inquired about checking another image of the brain since she hit her head and her headache/pain is worse.  I explained that I didn't think repeat imaging was warranted.  She is describing her chronic symptoms and her exam is objectively unremarkable.     Subjective:  Deborah Lawson a 31year old female who follows up for atypical facial pain.  She is accompanied by Arabic speaking interpreter   UPDATE: Due to ongoing cervical radiculitis, MRI of cervical spine was performed on 07/22/2021, which was personally reviewed and showed mild cervical spondylosis but no significant spinal canal or foraminal stenosis.  NCV-EMG of left upper extremity was ordered but appears to never have been performed.     CT sinuses on 09/21/2021 revealed resolution of previous left maxillary sinusitis.     She continues to have left sided occipital/facial burning pain and pain radiating down the left arm.  Also reports pain behind the eyes.  A couple of months ago, she hit her head and the pain is worse.   She has history of chronic low back pain radiating down the left leg.  This past week, she has worsening left leg pain with anterior numbness from the thigh down to the foot.  No trauma.    Current NSAIDS/analgesics:  ibuprofen Current triptans:  none Current ergotamine:  none Current anti-emetic:  none Current muscle relaxants:  none Current Antihypertensive medications:  none Current Antidepressant medications:  none Current Anticonvulsant medications:  gabapentin 301mQHS to twice daily Current anti-CGRP:  none Current Vitamins/Herbal/Supplements:  none Current Antihistamines/Decongestants:  none Other therapy:  none Hormone/birth control:  none   HISTORY: She started having a headache around January  No prior history of headache.  It is a moderate to severe left sided pressure/aching pain involving the left temple, eye, ear and face, including along the jaw and behind TMJ.  Sometimes with associated left sided/occipital numbness and tingling.  It comes and goes during the day the pain lessens and resolves by next day.  They occurs when she feels stressed or depressed, about 3 to 4 days a week.  Sometimes associated neck pain.  No associated nausea, vomiting, photophobia, phonophobia, visual disturbance, autonomic symptoms, numbness or weakness.  She went to the ED where she was diagnosed with a cavity.  She had the tooth pulled but pain persisted.  She was seen in the ED on 04/23/2020 where CT head personally reviewed was unremarkable and she was treated with headache cocktail.  She returned to the ED on 05/31/2020 for left sided TMJ pain where she was prescribed a prednisone taper and instructed to take Motrin as needed.    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  She was referred to endocrinology. She saw the dentist and had the cavity treated. Noted some improvement.  .  Referred to PT for neck/TMJ pain.  Still with neck pain radiating down left arm.  In March 2023, she was treated for left otitis media.  Still has left ear pain..  Treated for sinusitis and left otitis media.   Still with pain in left ear.  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  She saw endocrinology regarding the pituitary microadenoma with normal workup.      PAIN:  Are you having pain? Yes: NPRS scale: 6/10 Pain location: left shoulder Pain description: tightness Aggravating factors: picking up clothes  Relieving factors: exercises/HEP   PRECAUTIONS: None  WEIGHT BEARING RESTRICTIONS: No  FALLS:  Has patient fallen in last 6 months? No  OBJECTIVE:   TODAY'S TREATMENT:                                                                                                                              -Time spent addressing pt concerns of muscle soreness in days following higher level strengthening being expected vs insidious issue.  Pt perseverating on prior MD appt where she was given injection because her pain was so bad she "could not breathe".  PT educated pt on seeking emergency medical attention if she is struggling to breathe at home and expected reactions to physical activity. -Standing kickbacks at countertop alternating w/ hip abduction 2x15 each exercise, pt maintains forward flexed trunk and increased speed of movement, interpreter not providing all cues to patient provided by therapist to promote correction.  Pt vaguely able to return demo from PT.  Pt indicates pain throughout all movement w/ prolonged rest provided between sets. -Overhead press w/ bimanual 2lb wts -10lb kettlebell passing around waist level to promote core engagement with scapulohumeral stability in multiple planes of UE movement -Standing small forward and backward arm circles x20 each direction > progressed to large circles x20 forwards and backwards, pt severely dyspneic following task, SpO2 99%, HR 100bpm -Forward press x15 w/ bilateral 2lb wts, pt heavily  cued to prevent excessively rapid movements for both safety and general strength benefit, again interpreter not providing all cues to pt. -Assessed mom picking up child from floor level, she primarily carries child on left and lifts w/ poor mechanics, discussed mechanics practiced in session prior and benefit of varying side the child is carried on to prevent further left sided stress and consideration of a soft sling to support child's bottom and weight distribution on mom's shoulder and back.  Mom states child is too big for this option. -Waist to overhead lift of 5lb kettlebell to mimic ADL and child care tasks x15 -5lb shoulder carry 2x200' alt UE, unable to successfully cue pt for mechanical corrections in posture during task due to child holding onto RLE and interpreter remaining seated at mat during task.  PATIENT EDUCATION:  Education details: Continue HEP.  Walk regularly for general activity tolerance. Person educated: Patient Education method: Explanation, Demonstration, and Handouts Education comprehension: verbalized understanding and returned demonstration  HOME EXERCISE PROGRAM: [id number]  ASSESSMENT:  CLINICAL IMPRESSION: Continued focus on returning patient to normal ADL type lifting and carrying tasks with emphasis on improved mechanics.  Pt compensating severely during most tasks this session citing pain, but also performing tasks too quickly throughout.  PT to continue  addressing mechanics for safety and hopeful carryover of pain management in daily tasks at home.     OBJECTIVE IMPAIRMENTS: decreased ROM, decreased strength, hypomobility, increased fascial restrictions, increased muscle spasms, impaired flexibility, impaired sensation, improper body mechanics, postural dysfunction, and pain.   ACTIVITY LIMITATIONS: carrying, lifting, transfers, bed mobility, toileting, dressing, reach over head, hygiene/grooming, and caring for others  PARTICIPATION LIMITATIONS: cleaning,  laundry, driving, shopping, community activity, and occupation  PERSONAL FACTORS: Age, Behavior pattern, Education, Fitness, Social background, and Time since onset of injury/illness/exacerbation are also affecting patient's functional outcome.   REHAB POTENTIAL: Good  CLINICAL DECISION MAKING: Stable/uncomplicated  EVALUATION COMPLEXITY: Low   GOALS: Goals reviewed with patient? No  SHORT TERM GOALS: Target date: 02/08/2022  Will be compliant with appropriate progressive HEP  Baseline: Goal status: MET  2.  Pain in neck to be no more than 4/10 and 2/10 in back at worst  Baseline:  Goal status: IN PROGRESS 11/15 5/10  3.  Will have better understanding of posture/biomechanics  Baseline:  Goal status: IN PROGRESS 11/15- needs ongoing training   4.  Cervical flexion/extension ROM to be WNL  Baseline:  Goal status: MET11/15 full ROM    LONG TERM GOALS: Target date: 03/01/2022  MMT to be assessed/appropriate LTG written  Baseline:  Goal status: INITIAL  2.  Will be able to complete all functional dressing/bathing tasks and child care without increase in pain  Baseline:  Goal status: INITIAL  3.  Will demonstrate good biomechanics for floor to waist height lifting  Baseline:  Goal status: INITIAL  4.  Will be able to drive and perform all household tasks/responsibilities without increase in pain Baseline:  Goal status: INITIAL   PLAN:  PT FREQUENCY: 2x/week  PT DURATION: 6 weeks  PLANNED INTERVENTIONS: Therapeutic exercises, Therapeutic activity, Neuromuscular re-education, Patient/Family education, Self Care, Manual therapy, and Re-evaluation.  PLAN FOR NEXT SESSION: postural training and strengthening, core/hip strengthening, general mobility for spine as a whole, continue focus on biomechanics training, general endurance, periscapular and posterior shoulder strength, cervical and scapulohumeral mobility   Elease Etienne, PT, DPT 02/12/2022, 1:16  PM   Check all possible CPT codes: 76226 - PT Re-evaluation, 97110- Therapeutic Exercise, [phone]- Neuro Re-education, 97140 - Manual Therapy, 97530 - Therapeutic Activities, and 97535 - Self Care    Check all conditions that are expected to impact treatment: Musculoskeletal disorders and Social determinants of health   If treatment provided at initial evaluation, no treatment charged due to lack of authorization.

## 2022-02-16 ENCOUNTER — Ambulatory Visit: Payer: Medicaid Other | Admitting: Physical Therapy

## 2022-02-19 ENCOUNTER — Ambulatory Visit: Payer: Medicaid Other | Admitting: Physical Therapy

## 2022-02-23 ENCOUNTER — Encounter: Payer: Self-pay | Admitting: Physical Therapy

## 2022-02-23 ENCOUNTER — Ambulatory Visit: Payer: Medicaid Other | Admitting: Physical Therapy

## 2022-02-23 DIAGNOSIS — M542 Cervicalgia: Secondary | ICD-10-CM | POA: Diagnosis not present

## 2022-02-23 DIAGNOSIS — M26609 Unspecified temporomandibular joint disorder, unspecified side: Secondary | ICD-10-CM

## 2022-02-23 DIAGNOSIS — M5416 Radiculopathy, lumbar region: Secondary | ICD-10-CM

## 2022-02-23 NOTE — Therapy (Signed)
OUTPATIENT PHYSICAL THERAPY THORACOLUMBAR TREATMENT   Patient Name: Deborah Lawson MRN: [id number] DOB:1990-12-27, 31 y.o., female Today's Date: 02/23/2022   PT End of Session - 02/23/22 1024     Visit Number 7    Number of Visits 13    Date for PT Re-Evaluation 03/01/22    Authorization Type [employer] Medicaid    Authorization Time Period 01/18/22 to 03/01/22    PT Start Time 1022   session limited due to child upset at onset w/ pt consoling child and interpreter 7 minutes late.   PT Stop Time 1055    PT Time Calculation (min) 33 min    Equipment Utilized During Treatment Other (comment)   interpreter late, pt has to stop several times to console/manage child   Activity Tolerance Patient tolerated treatment well    Behavior During Therapy [facility] for tasks assessed/performed            Past Medical History:  Diagnosis Date   Gestational diabetes    Gestational hypertension    Past Surgical History:  Procedure Laterality Date   CESAREAN SECTION N/A 10/26/2019   Procedure: CESAREAN SECTION;  Surgeon: Donnamae Jude, MD;  Location: [facility] LD ORS;  Service: Obstetrics;  Laterality: N/A;   MANDIBLE SURGERY     Patient Active Problem List   Diagnosis Date Noted   Miscarriage 11/11/2021   White matter abnormality on MRI of brain 07/06/2021   Abnormal computed tomography of paranasal sinus 07/06/2021   Vitamin D deficiency 11/11/2020   Iron deficiency 11/11/2020   Gestational diabetes    Gestational hypertension    Language barrier 10/12/2019    PCP: Isaac Bliss, Olam Idler   REFERRING PROVIDER: Pieter Partridge, DO  REFERRING DIAG: M54.2 (ICD-10-CM) - Cervicalgia M54.16 (ICD-10-CM) - Left lumbar radiculopathy  Rationale for Evaluation and Treatment: Rehabilitation  THERAPY DIAG:  Cervicalgia  Left lumbar radiculopathy  TMJ dysfunction  ONSET DATE: 01/08/2022  SUBJECTIVE:                                                                                                                                                                                            SUBJECTIVE STATEMENT:  Pt presents without in-person interpreter initially (interpreter 7 minutes late).  In-person interpreter joins session at 10:22, Deborah Lawson.  She continues to have the same pain in her left shoulder/shoulder blade region.  PERTINENT HISTORY:     NEUROLOGY FOLLOW UP OFFICE NOTE   Deborah Lawson [id number]   Assessment/Plan:    1  Atypical left sided headache/facial pain- description sounds cervicogenic but no structural abnormalities on cervical spine MRI.  MRI of brain  unrevealing as well 2  Left sided arm pain/numbness - nothing on MRI of cervical spine to explain symptoms 3  acute on chronic low back pain with left lumbar radiculopathy     Titrate gabapentin to 680m twice daily Refer to physical therapy for low back pain with lumbar radiculopathy NCV-EMG of left upper extremity Follow up 4-5 months. Patient inquired about checking another image of the brain since she hit her head and her headache/pain is worse.  I explained that I didn't think repeat imaging was warranted.  She is describing her chronic symptoms and her exam is objectively unremarkable.     Subjective:  Deborah Lawson a 31year old female who follows up for atypical facial pain.  She is accompanied by Arabic speaking interpreter   UPDATE: Due to ongoing cervical radiculitis, MRI of cervical spine was performed on 07/22/2021, which was personally reviewed and showed mild cervical spondylosis but no significant spinal canal or foraminal stenosis.  NCV-EMG of left upper extremity was ordered but appears to never have been performed.     CT sinuses on 09/21/2021 revealed resolution of previous left maxillary sinusitis.     She continues to have left sided occipital/facial burning pain and pain radiating down the left arm.  Also reports pain behind the eyes.  A couple of months ago, she hit her head and the pain is  worse.   She has history of chronic low back pain radiating down the left leg.  This past week, she has worsening left leg pain with anterior numbness from the thigh down to the foot.  No trauma.   Current NSAIDS/analgesics:  ibuprofen Current triptans:  none Current ergotamine:  none Current anti-emetic:  none Current muscle relaxants:  none Current Antihypertensive medications:  none Current Antidepressant medications:  none Current Anticonvulsant medications:  gabapentin 3090mQHS to twice daily Current anti-CGRP:  none Current Vitamins/Herbal/Supplements:  none Current Antihistamines/Decongestants:  none Other therapy:  none Hormone/birth control:  none   HISTORY: She started having a headache around January  No prior history of headache.  It is a moderate to severe left sided pressure/aching pain involving the left temple, eye, ear and face, including along the jaw and behind TMJ.  Sometimes with associated left sided/occipital numbness and tingling.  It comes and goes during the day the pain lessens and resolves by next day.  They occurs when she feels stressed or depressed, about 3 to 4 days a week.  Sometimes associated neck pain.  No associated nausea, vomiting, photophobia, phonophobia, visual disturbance, autonomic symptoms, numbness or weakness.  She went to the ED where she was diagnosed with a cavity.  She had the tooth pulled but pain persisted.  She was seen in the ED on 04/23/2020 where CT head personally reviewed was unremarkable and she was treated with headache cocktail.  She returned to the ED on 05/31/2020 for left sided TMJ pain where she was prescribed a prednisone taper and instructed to take Motrin as needed.    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  She was referred to endocrinology. She saw the dentist and had the cavity treated. Noted some improvement.  .  Referred to  PT for neck/TMJ pain.  Still with neck pain radiating down left arm.  In March 2023, she was treated for left otitis media.  Still has left ear pain..  Treated for sinusitis and left  otitis media.  Still with pain in left ear.  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  She saw endocrinology regarding the pituitary microadenoma with normal workup.      PAIN:  Are you having pain? Yes: NPRS scale: 7/10 Pain location: left shoulder Pain description: tightness Aggravating factors: picking up clothes  Relieving factors: exercises/HEP   PRECAUTIONS: None  WEIGHT BEARING RESTRICTIONS: No  FALLS:  Has patient fallen in last 6 months? No  OBJECTIVE:   TODAY'S TREATMENT:                                                                                                                              -push up plus at wall 2x8, pt continues to use poor core engagement to keep pelvis in line with shoulders and performs reps too quickly -Seated on green physioball w/ mirror feedback:  marches > leg extension holds using hands on side of ball for stability > anterior to posterior weight shifts w/ cues to shift to edge of BOS > lateral weight shift w/ same cues > Counterclockwise circles and clockwise circles for pelvic and low back mobility and core stability cued to lead w/ core muscles -Floor to ceiling reaches seated at edge of chair x20 w/ paced breathing -Forward and lateral reaches seated on soft side of BOSU x15 each direction, max encouragement to have pt increase reach outside BOS to engage core  PATIENT EDUCATION:  Education details: Continue HEP.  Walk regularly for general activity tolerance.  Reprinted HEP, pt looks over with interpreter present and confirms she understands tasks. Person educated: Patient Education method: Explanation,  Demonstration, and Handouts Education comprehension: verbalized understanding and returned demonstration  HOME EXERCISE PROGRAM: [id number]-not translated as pt confirms she can read english (reprinted 12/12)  ASSESSMENT:  CLINICAL IMPRESSION: Session significantly limited today due to interpreter arriving late and mother having to console and manage young child throughout session.  Continued to address spine mobility and core engagement with reprinting of HEP to promote compliance to activity at home as pt reports general inactivity.  Will assess goals and discharge next visit.   OBJECTIVE IMPAIRMENTS: decreased ROM, decreased strength, hypomobility, increased fascial restrictions, increased muscle spasms, impaired flexibility, impaired sensation, improper body mechanics, postural dysfunction, and pain.   ACTIVITY LIMITATIONS: carrying, lifting, transfers, bed mobility, toileting, dressing, reach over head, hygiene/grooming, and caring for others  PARTICIPATION LIMITATIONS: cleaning, laundry, driving, shopping, community activity, and occupation  PERSONAL FACTORS: Age, Behavior pattern, Education, Fitness, Social background, and Time since onset of injury/illness/exacerbation are also affecting patient's functional outcome.   REHAB POTENTIAL: Good  CLINICAL DECISION MAKING: Stable/uncomplicated  EVALUATION COMPLEXITY: Low   GOALS: Goals reviewed with patient? No  SHORT TERM GOALS: Target date: 02/08/2022  Will be compliant with appropriate progressive HEP  Baseline: Goal status: MET  2.  Pain in neck to be no more than 4/10 and 2/10 in back at worst  Baseline:  Goal status: IN PROGRESS 11/15 5/10  3.  Will have better understanding of posture/biomechanics  Baseline:  Goal status: IN PROGRESS 11/15- needs ongoing training   4.  Cervical flexion/extension ROM to be WNL  Baseline:  Goal status: MET11/15 full ROM    LONG TERM GOALS: Target date: 03/01/2022  MMT to be  assessed/appropriate LTG written  Baseline:  Goal status: INITIAL  2.  Will be able to complete all functional dressing/bathing tasks and child care without increase in pain  Baseline:  Goal status: INITIAL  3.  Will demonstrate good biomechanics for floor to waist height lifting  Baseline:  Goal status: INITIAL  4.  Will be able to drive and perform all household tasks/responsibilities without increase in pain Baseline:  Goal status: INITIAL   PLAN:  PT FREQUENCY: 2x/week  PT DURATION: 6 weeks  PLANNED INTERVENTIONS: Therapeutic exercises, Therapeutic activity, Neuromuscular re-education, Patient/Family education, Self Care, Manual therapy, and Re-evaluation.  PLAN FOR NEXT SESSION: postural training and strengthening, core/hip strengthening, general mobility for spine as a whole, continue focus on biomechanics training, general endurance, periscapular and posterior shoulder strength, cervical and scapulohumeral mobility   Elease Etienne, PT, DPT 02/23/2022, 10:59 AM   Check all possible CPT codes: 38453 - PT Re-evaluation, 97110- Therapeutic Exercise, [phone]- Neuro Re-education, 97140 - Manual Therapy, 97530 - Therapeutic Activities, and 97535 - Self Care    Check all conditions that are expected to impact treatment: Musculoskeletal disorders and Social determinants of health   If treatment provided at initial evaluation, no treatment charged due to lack of authorization.

## 2022-02-25 ENCOUNTER — Ambulatory Visit: Payer: Medicaid Other | Admitting: Physical Therapy

## 2022-03-02 ENCOUNTER — Ambulatory Visit: Payer: Medicaid Other | Admitting: Physical Therapy

## 2022-03-04 ENCOUNTER — Ambulatory Visit: Payer: Medicaid Other | Admitting: Physical Therapy

## 2022-03-12 ENCOUNTER — Telehealth: Payer: Self-pay | Admitting: Internal Medicine

## 2022-03-12 ENCOUNTER — Emergency Department (HOSPITAL_COMMUNITY)
Admission: EM | Admit: 2022-03-12 | Discharge: 2022-03-12 | Discharge status: Against Medical Advice | Payer: Medicaid Other | Attending: Emergency Medicine | Admitting: Emergency Medicine

## 2022-03-12 ENCOUNTER — Encounter (HOSPITAL_COMMUNITY): Payer: Self-pay | Admitting: Emergency Medicine

## 2022-03-12 ENCOUNTER — Emergency Department (HOSPITAL_COMMUNITY): Payer: Medicaid Other

## 2022-03-12 DIAGNOSIS — R079 Chest pain, unspecified: Secondary | ICD-10-CM | POA: Insufficient documentation

## 2022-03-12 DIAGNOSIS — Z5321 Procedure and treatment not carried out due to patient leaving prior to being seen by health care provider: Secondary | ICD-10-CM | POA: Diagnosis not present

## 2022-03-12 LAB — CBC WITH DIFFERENTIAL/PLATELET
Abs Immature Granulocytes: 0.01 10*3/uL (ref 0.00–0.07)
Basophils Absolute: 0 10*3/uL (ref 0.0–0.1)
Basophils Relative: 0 %
Eosinophils Absolute: 0 10*3/uL (ref 0.0–0.5)
Eosinophils Relative: 1 %
HCT: 38.9 % (ref 36.0–46.0)
Hemoglobin: 12.5 g/dL (ref 12.0–15.0)
Immature Granulocytes: 0 %
Lymphocytes Relative: 44 %
Lymphs Abs: 3.2 10*3/uL (ref 0.7–4.0)
MCH: 26.2 pg (ref 26.0–34.0)
MCHC: 32.1 g/dL (ref 30.0–36.0)
MCV: 81.4 fL (ref 80.0–100.0)
Monocytes Absolute: 0.6 10*3/uL (ref 0.1–1.0)
Monocytes Relative: 8 %
Neutro Abs: 3.4 10*3/uL (ref 1.7–7.7)
Neutrophils Relative %: 47 %
Platelets: 409 10*3/uL — ABNORMAL HIGH (ref 150–400)
RBC: 4.78 MIL/uL (ref 3.87–5.11)
RDW: 14.6 % (ref 11.5–15.5)
WBC: 7.3 10*3/uL (ref 4.0–10.5)
nRBC: 0 % (ref 0.0–0.2)

## 2022-03-12 LAB — URINALYSIS, ROUTINE W REFLEX MICROSCOPIC
Bilirubin Urine: NEGATIVE
Glucose, UA: NEGATIVE mg/dL
Ketones, ur: NEGATIVE mg/dL
Nitrite: NEGATIVE
Protein, ur: NEGATIVE mg/dL
Specific Gravity, Urine: 1.006 (ref 1.005–1.030)
pH: 6 (ref 5.0–8.0)

## 2022-03-12 LAB — COMPREHENSIVE METABOLIC PANEL
ALT: 61 U/L — ABNORMAL HIGH (ref 0–44)
AST: 50 U/L — ABNORMAL HIGH (ref 15–41)
Albumin: 3.7 g/dL (ref 3.5–5.0)
Alkaline Phosphatase: 70 U/L (ref 38–126)
Anion gap: 7 (ref 5–15)
BUN: 8 mg/dL (ref 6–20)
CO2: 25 mmol/L (ref 22–32)
Calcium: 9.5 mg/dL (ref 8.9–10.3)
Chloride: 104 mmol/L (ref 98–111)
Creatinine, Ser: 0.52 mg/dL (ref 0.44–1.00)
GFR, Estimated: >60 mL/min (ref >60)
Glucose, Bld: 122 mg/dL — ABNORMAL HIGH (ref 70–99)
Potassium: 3.9 mmol/L (ref 3.5–5.1)
Sodium: 136 mmol/L (ref 135–145)
Total Bilirubin: 0.7 mg/dL (ref 0.3–1.2)
Total Protein: 7.5 g/dL (ref 6.5–8.1)

## 2022-03-12 LAB — PREGNANCY, URINE: Preg Test, Ur: NEGATIVE

## 2022-03-12 LAB — TROPONIN I (HIGH SENSITIVITY): Troponin I (High Sensitivity): <2 ng/L (ref <18)

## 2022-03-12 NOTE — ED Notes (Signed)
Pt came to sort desk and advised she wanted to leave. IV was removed and patient left ED

## 2022-03-12 NOTE — Telephone Encounter (Signed)
Pt stated she is having rt hand px and px in her chest near her heart. Pt was sent to Triage and the nurse recommended that she goes to UC however pt stated she just wanted to make an appt for next wk.   Let pt know that I am unable to sched the appt and will send a msg to Dr team so someone can give her a call.   Please advise.

## 2022-03-12 NOTE — Telephone Encounter (Signed)
---  Caller states chest pain started yesterday. had a chest CT yesterday in ER. wants to make an appt. mild CP now, worse with deep breath. temp 98  03/12/2022 8:49:16 AM Go to ED Now (or PCP triage) Risa Grill, RN, Tracie  Referrals [facility] at [facility] - UC  03/12/22 1245 - Pt states she continues to have pain on bilat chest pain that is intermittent.Denies. fever. Pt states she felt chest pain since the last time she left the hospital. She also feels SOB when she feels chest pain. Pt states she felt it when she went to ED today. Pt scheduled to see PCP on 03/17/22. Advised to return to the ED & not leave without being seen if pain persists. Pt verb understanding.

## 2022-03-12 NOTE — ED Triage Notes (Addendum)
Chest pain, SOB, palpitations all day. Speaks arabic. Hx of migraines and HTN. Pt was given aspirin by EMS.

## 2022-03-12 NOTE — ED Provider Triage Note (Signed)
Emergency Medicine Provider Triage Evaluation Note  Deborah Lawson , a 31 y.o. female  was evaluated in triage.  Pt complains of left side pain started yesterday, states that she has difficulty with urination burning sensation, states that she has history of kidney infections/kidney stones and feels similar to that..  Review of Systems  Positive: Left side pain, difficulty with urination Negative: Nausea vomiting  Physical Exam  BP 108/65 (BP Location: Right Arm)   Pulse (!) 110   Temp 98.7 F (37.1 C) (Oral)   Resp 17   SpO2 99%  Gen:   Awake, no distress   Resp:  Normal effort  MSK:   Moves extremities without difficulty  Other:    Medical Decision Making  Medically screening exam initiated at 1:20 AM.  Appropriate orders placed.  Deborah Lawson was informed that the remainder of the evaluation will be completed by another provider, this initial triage assessment does not replace that evaluation, and the importance of remaining in the ED until their evaluation is complete.  Lab work is admitted or will need further workup.   Carroll Sage, PA-C 03/12/22 0121

## 2022-03-17 ENCOUNTER — Ambulatory Visit (INDEPENDENT_AMBULATORY_CARE_PROVIDER_SITE_OTHER): Payer: Medicaid Other | Admitting: Internal Medicine

## 2022-03-17 ENCOUNTER — Encounter: Payer: Self-pay | Admitting: Internal Medicine

## 2022-03-17 VITALS — BP 110/70 | HR 95 | Temp 98.2°F | Wt 195.5 lb

## 2022-03-17 DIAGNOSIS — R7401 Elevation of levels of liver transaminase levels: Secondary | ICD-10-CM

## 2022-03-17 DIAGNOSIS — N3001 Acute cystitis with hematuria: Secondary | ICD-10-CM

## 2022-03-17 DIAGNOSIS — R1084 Generalized abdominal pain: Secondary | ICD-10-CM

## 2022-03-17 LAB — URINALYSIS, ROUTINE W REFLEX MICROSCOPIC
Ketones, ur: NEGATIVE
Nitrite: NEGATIVE
Specific Gravity, Urine: 1.025 (ref 1.000–1.030)
Total Protein, Urine: NEGATIVE
Urine Glucose: NEGATIVE
Urobilinogen, UA: 0.2 (ref 0.0–1.0)
pH: 6 (ref 5.0–8.0)

## 2022-03-17 LAB — POCT URINALYSIS DIPSTICK
Bilirubin, UA: NEGATIVE
Glucose, UA: NEGATIVE
Ketones, UA: NEGATIVE
Nitrite, UA: NEGATIVE
Protein, UA: POSITIVE — AB
Spec Grav, UA: 1.02 (ref 1.010–1.025)
Urobilinogen, UA: 0.2 E.U./dL
pH, UA: 5.5 (ref 5.0–8.0)

## 2022-03-17 MED ORDER — SULFAMETHOXAZOLE-TRIMETHOPRIM 800-160 MG PO TABS: 1.0000 | ORAL_TABLET | Freq: Two times a day (BID) | ORAL | 0 refills | Status: AC

## 2022-03-17 NOTE — Progress Notes (Signed)
Established Patient Office Visit     CC/Reason for Visit: Dysuria  HPI: Deborah Lawson is a 32 y.o. female who is coming in today for the above mentioned reasons.  For the past few weeks she has been having dysuria, subjective fevers, suprapubic pain, urinary frequency and urgency.  She presented to the emergency department on 12/29 for the same, however left AMA without being seen after waiting approximately 6 hours.  She was noted to have transaminitis with AST and ALT of 50 and 61 respectively.   Past Medical/Surgical History: Past Medical History:  Diagnosis Date   Gestational diabetes    Gestational hypertension     Past Surgical History:  Procedure Laterality Date   CESAREAN SECTION N/A 10/26/2019   Procedure: CESAREAN SECTION;  Surgeon: Donnamae Jude, MD;  Location: [facility] LD ORS;  Service: Obstetrics;  Laterality: N/A;   MANDIBLE SURGERY      Social History:  reports that she has never smoked. She has never been exposed to tobacco smoke. She has never used smokeless tobacco. She reports that she does not drink alcohol and does not use drugs.  Allergies: No Known Allergies  Family History:  Family History  Problem Relation Age of Onset   Hypertension Father      Current Outpatient Medications:    dicyclomine (BENTYL) 20 MG tablet, Take 1 tablet (20 mg total) by mouth 4 (four) times daily as needed (intestinal cramps)., Disp: 40 tablet, Rfl: 0   gabapentin (NEURONTIN) 300 MG capsule, Increase to 1 capsule in morning and 2 capsules at bedtime for one week, then increase to 2 capsules twice daily, Disp: 120 capsule, Rfl: 0   ketoconazole (NIZORAL) 2 % shampoo, APPLY 1 APPLICATION TOPICALLY 2 (TWO) TIMES A WEEK., Disp: 120 mL, Rfl: 0   sulfamethoxazole-trimethoprim (BACTRIM DS) 800-160 MG tablet, Take 1 tablet by mouth 2 (two) times daily for 7 days., Disp: 14 tablet, Rfl: 0   Vitamin D, Ergocalciferol, (DRISDOL) 1.25 MG (50000 UNIT) CAPS capsule, Take 1 capsule  (50,000 Units total) by mouth every 7 (seven) days for 12 doses., Disp: 12 capsule, Rfl: 0   hydrOXYzine (ATARAX) 10 MG tablet, Take 1 tablet (10 mg total) by mouth at bedtime as needed for itching. (Patient not taking: Reported on 03/17/2022), Disp: 20 tablet, Rfl: 0   meclizine (ANTIVERT) 25 MG tablet, Take 1 tablet (25 mg total) by mouth 3 (three) times daily as needed for dizziness (vertigo). (Patient not taking: Reported on 03/17/2022), Disp: 30 tablet, Rfl: 0  Review of Systems:  Constitutional: Positive for fever, chills, diaphoresis HEENT: Denies photophobia, eye pain, redness, hearing loss, ear pain, congestion, sore throat, rhinorrhea, sneezing, mouth sores, trouble swallowing, neck pain, neck stiffness and tinnitus.   Respiratory: Denies  cough, chest tightness,  and wheezing.   Cardiovascular: Denies leg swelling.  Gastrointestinal: Denies vomiting, diarrhea, constipation, blood in stool and abdominal distention.  Genitourinary: Positive for dysuria, urgency, frequency, hematuria. Endocrine: Denies: hot or cold intolerance, sweats, changes in hair or nails, polyuria, polydipsia. Musculoskeletal: Denies myalgias, back pain, joint swelling, arthralgias and gait problem.  Skin: Denies pallor, rash and wound.  Neurological: Denies dizziness, seizures, syncope, weakness, light-headedness, numbness and headaches.  Hematological: Denies adenopathy. Easy bruising, personal or family bleeding history  Psychiatric/Behavioral: Denies suicidal ideation, mood changes, confusion, nervousness, sleep disturbance and agitation    Physical Exam: Vitals:   03/17/22 1031  BP: 110/70  Pulse: 95  Temp: 98.2 F (36.8 C)  TempSrc: Oral  SpO2: 99%  Weight: 195 lb 8 oz (88.7 kg)    Body mass index is 32.53 kg/m.   Constitutional: NAD, calm, comfortable Eyes: PERRL, lids and conjunctivae normal ENMT: Mucous membranes are moist.  Respiratory: clear to auscultation bilaterally, no wheezing, no  crackles. Normal respiratory effort. No accessory muscle use.  Cardiovascular: Regular rate and rhythm, no murmurs / rubs / gallops. No extremity edema.   Psychiatric: Normal judgment and insight. Alert and oriented x 3. Normal mood.    Impression and Plan:  Generalized abdominal pain - Plan: POCT urinalysis dipstick, Urinalysis, Urine Culture  Acute cystitis with hematuria - Plan: sulfamethoxazole-trimethoprim (BACTRIM DS) 800-160 MG tablet  Transaminitis  -In office urine dipstick is positive for leukocytes, blood and protein.  Suspect all her symptoms are related to a UTI. -Start Bactrim DS for 7 days, send for urine culture. -In the ED she was noted to have mild transaminitis, she has an upcoming CPE, at that point labs will be rechecked.  Further workup if needed.  Time spent:31 minutes reviewing chart, interviewing and examining patient and formulating plan of care.       Lelon Frohlich, MD [facility] at [facility]

## 2022-03-20 LAB — URINE CULTURE
MICRO NUMBER:: 14382864
SPECIMEN QUALITY:: ADEQUATE

## 2022-03-30 ENCOUNTER — Encounter: Payer: Self-pay | Admitting: Internal Medicine

## 2022-03-30 ENCOUNTER — Ambulatory Visit (INDEPENDENT_AMBULATORY_CARE_PROVIDER_SITE_OTHER): Payer: Medicaid Other | Admitting: Internal Medicine

## 2022-03-30 VITALS — BP 128/80 | HR 108 | Temp 98.1°F | Ht 65.0 in | Wt 194.2 lb

## 2022-03-30 DIAGNOSIS — R0789 Other chest pain: Secondary | ICD-10-CM

## 2022-03-30 DIAGNOSIS — M549 Dorsalgia, unspecified: Secondary | ICD-10-CM | POA: Diagnosis not present

## 2022-03-30 MED ORDER — CYCLOBENZAPRINE HCL 5 MG PO TABS: 5.0000 mg | ORAL_TABLET | Freq: Every evening | ORAL | 0 refills | Status: DC | PRN

## 2022-03-30 MED ORDER — MELOXICAM 7.5 MG PO TABS: 7.5000 mg | ORAL_TABLET | Freq: Every day | ORAL | 0 refills | Status: DC

## 2022-03-30 NOTE — Progress Notes (Signed)
Established Patient Office Visit     CC/Reason for Visit: Chest, neck and upper back pain  HPI: Deborah Lawson is a 32 y.o. female who is coming in today for the above mentioned reasons.  For the past few days she has been having terrible pain of her neck, left shoulder, left upper back and left upper chest.  Exam is performed with the help of an interpreter.  She has been doing some heavy lifting around the house.  She also has a 81-year-old that she carries around constantly.  She feels that with any movement of her arm or torso pain intensifies.  She does not have shortness of breath, no dizziness, no palpitations.  She has created a bruise over her left upper back where she and her husband have been doing intense massage sessions.   Past Medical/Surgical History: Past Medical History:  Diagnosis Date   Gestational diabetes    Gestational hypertension     Past Surgical History:  Procedure Laterality Date   CESAREAN SECTION N/A 10/26/2019   Procedure: CESAREAN SECTION;  Surgeon: Donnamae Jude, MD;  Location: [facility] LD ORS;  Service: Obstetrics;  Laterality: N/A;   MANDIBLE SURGERY      Social History:  reports that she has never smoked. She has never been exposed to tobacco smoke. She has never used smokeless tobacco. She reports that she does not drink alcohol and does not use drugs.  Allergies: No Known Allergies  Family History:  Family History  Problem Relation Age of Onset   Hypertension Father      Current Outpatient Medications:    cyclobenzaprine (FLEXERIL) 5 MG tablet, Take 1 tablet (5 mg total) by mouth at bedtime as needed for muscle spasms., Disp: 30 tablet, Rfl: 0   dicyclomine (BENTYL) 20 MG tablet, Take 1 tablet (20 mg total) by mouth 4 (four) times daily as needed (intestinal cramps)., Disp: 40 tablet, Rfl: 0   gabapentin (NEURONTIN) 300 MG capsule, Increase to 1 capsule in morning and 2 capsules at bedtime for one week, then increase to 2 capsules  twice daily, Disp: 120 capsule, Rfl: 0   ketoconazole (NIZORAL) 2 % shampoo, APPLY 1 APPLICATION TOPICALLY 2 (TWO) TIMES A WEEK., Disp: 120 mL, Rfl: 0   meloxicam (MOBIC) 7.5 MG tablet, Take 1 tablet (7.5 mg total) by mouth daily., Disp: 30 tablet, Rfl: 0   Vitamin D, Ergocalciferol, (DRISDOL) 1.25 MG (50000 UNIT) CAPS capsule, Take 1 capsule (50,000 Units total) by mouth every 7 (seven) days for 12 doses., Disp: 12 capsule, Rfl: 0   hydrOXYzine (ATARAX) 10 MG tablet, Take 1 tablet (10 mg total) by mouth at bedtime as needed for itching. (Patient not taking: Reported on 03/17/2022), Disp: 20 tablet, Rfl: 0   meclizine (ANTIVERT) 25 MG tablet, Take 1 tablet (25 mg total) by mouth 3 (three) times daily as needed for dizziness (vertigo). (Patient not taking: Reported on 03/17/2022), Disp: 30 tablet, Rfl: 0  Review of Systems:  Constitutional: Denies fever, chills, diaphoresis, appetite change and fatigue.  HEENT: Denies photophobia, eye pain, redness, hearing loss, ear pain, congestion, sore throat, rhinorrhea, sneezing, mouth sores, trouble swallowing, neck pain, neck stiffness and tinnitus.   Respiratory: Denies SOB, DOE, cough, chest tightness,  and wheezing.   Cardiovascular: Denies chest pain, palpitations and leg swelling.  Gastrointestinal: Denies nausea, vomiting, abdominal pain, diarrhea, constipation, blood in stool and abdominal distention.  Genitourinary: Denies dysuria, urgency, frequency, hematuria, flank pain and difficulty urinating.  Endocrine: Denies:  hot or cold intolerance, sweats, changes in hair or nails, polyuria, polydipsia. Musculoskeletal: Denies joint swelling, arthralgias and gait problem.  Skin: Denies pallor, rash and wound.  Neurological: Denies dizziness, seizures, syncope, weakness, light-headedness, numbness and headaches.  Hematological: Denies adenopathy. Easy bruising, personal or family bleeding history  Psychiatric/Behavioral: Denies suicidal ideation, mood changes,  confusion, nervousness, sleep disturbance and agitation    Physical Exam: Vitals:   03/30/22 0956  BP: 128/80  Pulse: (!) 108  Temp: 98.1 F (36.7 C)  TempSrc: Oral  SpO2: 98%  Weight: 194 lb 3 oz (88.1 kg)  Height: 5\' 5"  (1.651 m)    Body mass index is 32.31 kg/m.   Constitutional: NAD, calm, comfortable Eyes: PERRL, lids and conjunctivae normal ENMT: Mucous membranes are moist. Respiratory: clear to auscultation bilaterally, no wheezing, no crackles. Normal respiratory effort. No accessory muscle use.  Cardiovascular: Regular rate and rhythm, no murmurs / rubs / gallops. No extremity edema.   Psychiatric: Normal judgment and insight. Alert and oriented x 3. Normal mood.    Impression and Plan:  Musculoskeletal back pain - Plan: meloxicam (MOBIC) 7.5 MG tablet, cyclobenzaprine (FLEXERIL) 5 MG tablet  Musculoskeletal chest pain - Plan: meloxicam (MOBIC) 7.5 MG tablet, cyclobenzaprine (FLEXERIL) 5 MG tablet  -Pain with palpation of upper chest area, neck, upper back and behind shoulder blade.  She has been doing intense massages at home to the point of having a superficial bruise around her shoulder blade area. -This is likely musculoskeletal, related to posture and heavy lifting of her 29-year-old.  Have advised icing alternating with heat, warm soaks, professional massage, I will also send in a prescription for Flexeril and meloxicam. -If no improvement consider physical therapy referral.  Time spent:23 minutes reviewing chart, interviewing and examining patient and formulating plan of care.     Lelon Frohlich, MD [facility] at [facility]

## 2022-04-09 ENCOUNTER — Encounter: Payer: Self-pay | Admitting: Adult Health

## 2022-04-09 ENCOUNTER — Ambulatory Visit (INDEPENDENT_AMBULATORY_CARE_PROVIDER_SITE_OTHER): Payer: Medicaid Other | Admitting: Adult Health

## 2022-04-09 VITALS — BP 110/80 | HR 87 | Temp 98.3°F | Wt 198.8 lb

## 2022-04-09 DIAGNOSIS — N12 Tubulo-interstitial nephritis, not specified as acute or chronic: Secondary | ICD-10-CM | POA: Diagnosis not present

## 2022-04-09 DIAGNOSIS — R109 Unspecified abdominal pain: Secondary | ICD-10-CM | POA: Diagnosis not present

## 2022-04-09 LAB — POC URINALSYSI DIPSTICK (AUTOMATED)
Bilirubin, UA: NEGATIVE
Glucose, UA: NEGATIVE
Ketones, UA: NEGATIVE
Nitrite, UA: NEGATIVE
Protein, UA: POSITIVE — AB
Spec Grav, UA: 1.01 (ref 1.010–1.025)
Urobilinogen, UA: 0.2 E.U./dL
pH, UA: 7.5 (ref 5.0–8.0)

## 2022-04-09 MED ORDER — CIPROFLOXACIN HCL 500 MG PO TABS: 500.0000 mg | ORAL_TABLET | Freq: Two times a day (BID) | ORAL | 0 refills | Status: AC

## 2022-04-09 NOTE — Progress Notes (Signed)
Subjective:    Patient ID: Deborah Lawson, female    DOB: 1991-03-07, 32 y.o.   MRN: [id number]  HPI 32 year old female who  has a past medical history of Gestational diabetes and Gestational hypertension.  Interpreter present   She is a patient of Dr. Ardyth Harps who I am seeing today for an acute issue of concern for a UTI.   She was treated about three weeks ago with Bactrim for a UTI and was sensitive to this antibiotic on culture.She reports that her symptoms improved but then came back.    Today she reports that she has bilateral flank pain worse on left side. She also reports burning with urination, frequency and urgency  suprapubic pain, low back pain, fevers and chills.   She did take all her her antibiotics    Review of Systems See HPI   Past Medical History:  Diagnosis Date   Gestational diabetes    Gestational hypertension     Social History   Socioeconomic History   Marital status: Married    Spouse name: Not on file   Number of children: Not on file   Years of education: Not on file   Highest education level: GED or equivalent  Occupational History   Not on file  Tobacco Use   Smoking status: Never    Passive exposure: Never   Smokeless tobacco: Never  Vaping Use   Vaping Use: Never used  Substance and Sexual Activity   Alcohol use: Never   Drug use: Never   Sexual activity: Yes    Birth control/protection: None  Other Topics Concern   Not on file  Social History Narrative   Right handed   Social Determinants of Health   Financial Resource Strain: Low Risk  (03/16/2022)   Overall Financial Resource Strain (CARDIA)    Difficulty of Paying Living Expenses: Not very hard  Food Insecurity: No Food Insecurity (03/16/2022)   Hunger Vital Sign    Worried About Running Out of Food in the Last Year: Never true    Ran Out of Food in the Last Year: Never true  Transportation Needs: No Transportation Needs (03/16/2022)   PRAPARE - Doctor, general practice (Medical): No    Lack of Transportation (Non-Medical): No  Physical Activity: Unknown (03/16/2022)   Exercise Vital Sign    Days of Exercise per Week: 1 day    Minutes of Exercise per Session: Patient refused  Stress: No Stress Concern Present (03/16/2022)   [employer] of Occupational Health - Occupational Stress Questionnaire    Feeling of Stress : Only a little  Social Connections: Moderately Isolated (03/16/2022)   Social Connection and Isolation Panel [NHANES]    Frequency of Communication with Friends and Family: More than three times a week    Frequency of Social Gatherings with Friends and Family: Patient refused    Attends Religious Services: Never    Database administrator or Organizations: No    Attends Engineer, structural: Not on file    Marital Status: Married  Catering manager Violence: Not At Risk (03/17/2022)   Humiliation, Afraid, Rape, and Kick questionnaire    Fear of Current or Ex-Partner: No    Emotionally Abused: No    Physically Abused: No    Sexually Abused: No    Past Surgical History:  Procedure Laterality Date   CESAREAN SECTION N/A 10/26/2019   Procedure: CESAREAN SECTION;  Surgeon: Reva Bores, MD;  Location: [facility] LD ORS;  Service: Obstetrics;  Laterality: N/A;   MANDIBLE SURGERY      Family History  Problem Relation Age of Onset   Hypertension Father     No Known Allergies  Current Outpatient Medications on File Prior to Visit  Medication Sig Dispense Refill   cyclobenzaprine (FLEXERIL) 5 MG tablet Take 1 tablet (5 mg total) by mouth at bedtime as needed for muscle spasms. 30 tablet 0   dicyclomine (BENTYL) 20 MG tablet Take 1 tablet (20 mg total) by mouth 4 (four) times daily as needed (intestinal cramps). 40 tablet 0   gabapentin (NEURONTIN) 300 MG capsule Increase to 1 capsule in morning and 2 capsules at bedtime for one week, then increase to 2 capsules twice daily 120 capsule 0   hydrOXYzine (ATARAX) 10 MG tablet  Take 1 tablet (10 mg total) by mouth at bedtime as needed for itching. 20 tablet 0   ketoconazole (NIZORAL) 2 % shampoo APPLY 1 APPLICATION TOPICALLY 2 (TWO) TIMES A WEEK. 120 mL 0   meclizine (ANTIVERT) 25 MG tablet Take 1 tablet (25 mg total) by mouth 3 (three) times daily as needed for dizziness (vertigo). 30 tablet 0   meloxicam (MOBIC) 7.5 MG tablet Take 1 tablet (7.5 mg total) by mouth daily. 30 tablet 0   [DISCONTINUED] NIFEdipine (PROCARDIA XL) 30 MG 24 hr tablet Take 1 tablet (30 mg total) by mouth daily. 30 tablet 1   No current facility-administered medications on file prior to visit.    BP 110/80 (BP Location: Left Arm, Patient Position: Sitting, Cuff Size: Large)   Pulse 87   Temp 98.3 F (36.8 C) (Oral)   Wt 198 lb 12.8 oz (90.2 kg)   LMP 02/27/2022 (Exact Date)   SpO2 99%   BMI 33.08 kg/m       Objective:   Physical Exam Vitals and nursing note reviewed.  Constitutional:      Appearance: Normal appearance.  Cardiovascular:     Rate and Rhythm: Normal rate and regular rhythm.     Pulses: Normal pulses.     Heart sounds: Normal heart sounds.  Pulmonary:     Effort: Pulmonary effort is normal.     Breath sounds: Normal breath sounds.  Abdominal:     General: Abdomen is flat. Bowel sounds are normal.     Palpations: Abdomen is soft.     Tenderness: There is abdominal tenderness in the suprapubic area, left upper quadrant and left lower quadrant. There is left CVA tenderness. There is no guarding.     Hernia: No hernia is present.  Neurological:     General: No focal deficit present.     Mental Status: She is alert and oriented to person, place, and time.  Psychiatric:        Mood and Affect: Mood normal.        Behavior: Behavior normal.        Thought Content: Thought content normal.       Assessment & Plan:     1. Pyelonephritis  urinalysis 1+ blood, 3+ leukocytes, and protein.  Will treat with Cipro due to left-sided CVA tenderness. - POCT  Urinalysis Dipstick (Automated) - Urine Culture; Future - Urine Culture - ciprofloxacin (CIPRO) 500 MG tablet; Take 1 tablet (500 mg total) by mouth 2 (two) times daily for 7 days.  Dispense: 14 tablet; Refill: 0 -Encouraged to stay well-hydrated and follow-up if symptoms do not improve in the next 2 to 3 days or  if symptoms suddenly worsen.  2. Acute abdominal pain - from above

## 2022-04-09 NOTE — Patient Instructions (Signed)
It was great meeting you today   I have sent in a antibiotic called Cipro. Please take this twice a day for 7 days

## 2022-04-10 LAB — URINE CULTURE
MICRO NUMBER:: 14478805
Result:: NO GROWTH
SPECIMEN QUALITY:: ADEQUATE

## 2022-04-13 ENCOUNTER — Telehealth: Payer: Self-pay | Admitting: Internal Medicine

## 2022-04-13 DIAGNOSIS — R1084 Generalized abdominal pain: Secondary | ICD-10-CM

## 2022-04-13 NOTE — Telephone Encounter (Signed)
Pt saw NP on 04/09/22. Pt called to say she read her results on her Mychart and although it says no UTI, she is still in a lot of pain and would like some advice.  Please call Pt back.

## 2022-04-14 NOTE — Telephone Encounter (Signed)
Order placed Left detailed message on machine for patient.

## 2022-04-15 ENCOUNTER — Ambulatory Visit: Payer: Medicaid Other | Admitting: Internal Medicine

## 2022-04-20 ENCOUNTER — Encounter: Payer: Self-pay | Admitting: Internal Medicine

## 2022-04-20 ENCOUNTER — Ambulatory Visit (INDEPENDENT_AMBULATORY_CARE_PROVIDER_SITE_OTHER): Payer: Medicaid Other | Admitting: Internal Medicine

## 2022-04-20 VITALS — BP 110/80 | HR 111 | Temp 97.8°F | Ht 65.0 in | Wt 194.1 lb

## 2022-04-20 DIAGNOSIS — Z8632 Personal history of gestational diabetes: Secondary | ICD-10-CM | POA: Diagnosis not present

## 2022-04-20 DIAGNOSIS — M549 Dorsalgia, unspecified: Secondary | ICD-10-CM

## 2022-04-20 DIAGNOSIS — E611 Iron deficiency: Secondary | ICD-10-CM | POA: Diagnosis not present

## 2022-04-20 DIAGNOSIS — Z0000 Encounter for general adult medical examination without abnormal findings: Secondary | ICD-10-CM | POA: Diagnosis not present

## 2022-04-20 DIAGNOSIS — O139 Gestational [pregnancy-induced] hypertension without significant proteinuria, unspecified trimester: Secondary | ICD-10-CM

## 2022-04-20 DIAGNOSIS — Z8759 Personal history of other complications of pregnancy, childbirth and the puerperium: Secondary | ICD-10-CM

## 2022-04-20 DIAGNOSIS — O09299 Supervision of pregnancy with other poor reproductive or obstetric history, unspecified trimester: Secondary | ICD-10-CM

## 2022-04-20 DIAGNOSIS — E559 Vitamin D deficiency, unspecified: Secondary | ICD-10-CM

## 2022-04-20 DIAGNOSIS — O24419 Gestational diabetes mellitus in pregnancy, unspecified control: Secondary | ICD-10-CM

## 2022-04-20 DIAGNOSIS — R0789 Other chest pain: Secondary | ICD-10-CM | POA: Diagnosis not present

## 2022-04-20 LAB — CBC WITH DIFFERENTIAL/PLATELET
Basophils Absolute: 0 10*3/uL (ref 0.0–0.1)
Basophils Relative: 0.4 % (ref 0.0–3.0)
Eosinophils Absolute: 0.1 10*3/uL (ref 0.0–0.7)
Eosinophils Relative: 1.2 % (ref 0.0–5.0)
HCT: 38.2 % (ref 36.0–46.0)
Hemoglobin: 12.8 g/dL (ref 12.0–15.0)
Lymphocytes Relative: 48.8 % — ABNORMAL HIGH (ref 12.0–46.0)
Lymphs Abs: 3 10*3/uL (ref 0.7–4.0)
MCHC: 33.4 g/dL (ref 30.0–36.0)
MCV: 79.9 fl (ref 78.0–100.0)
Monocytes Absolute: 0.4 10*3/uL (ref 0.1–1.0)
Monocytes Relative: 7.2 % (ref 3.0–12.0)
Neutro Abs: 2.6 10*3/uL (ref 1.4–7.7)
Neutrophils Relative %: 42.4 % — ABNORMAL LOW (ref 43.0–77.0)
Platelets: 455 10*3/uL — ABNORMAL HIGH (ref 150.0–400.0)
RBC: 4.78 Mil/uL (ref 3.87–5.11)
RDW: 15 % (ref 11.5–15.5)
WBC: 6.1 10*3/uL (ref 4.0–10.5)

## 2022-04-20 LAB — COMPREHENSIVE METABOLIC PANEL
ALT: 28 U/L (ref 0–35)
AST: 22 U/L (ref 0–37)
Albumin: 4.5 g/dL (ref 3.5–5.2)
Alkaline Phosphatase: 70 U/L (ref 39–117)
BUN: 11 mg/dL (ref 6–23)
CO2: 23 mEq/L (ref 19–32)
Calcium: 9.3 mg/dL (ref 8.4–10.5)
Chloride: 104 mEq/L (ref 96–112)
Creatinine, Ser: 0.55 mg/dL (ref 0.40–1.20)
GFR: 123.28 mL/min (ref >60.00)
Glucose, Bld: 93 mg/dL (ref 70–99)
Potassium: 4 mEq/L (ref 3.5–5.1)
Sodium: 140 mEq/L (ref 135–145)
Total Bilirubin: 0.4 mg/dL (ref 0.2–1.2)
Total Protein: 8 g/dL (ref 6.0–8.3)

## 2022-04-20 LAB — VITAMIN B12: Vitamin B-12: 359 pg/mL (ref 211–911)

## 2022-04-20 LAB — LIPID PANEL
Cholesterol: 172 mg/dL (ref 0–200)
HDL: 38.6 mg/dL — ABNORMAL LOW (ref >39.00)
LDL Cholesterol: 115 mg/dL — ABNORMAL HIGH (ref 0–99)
NonHDL: 133.47
Total CHOL/HDL Ratio: 4
Triglycerides: 91 mg/dL (ref 0.0–149.0)
VLDL: 18.2 mg/dL (ref 0.0–40.0)

## 2022-04-20 LAB — TSH: TSH: 2.01 u[IU]/mL (ref 0.35–5.50)

## 2022-04-20 LAB — VITAMIN D 25 HYDROXY (VIT D DEFICIENCY, FRACTURES): VITD: 32.2 ng/mL (ref 30.00–100.00)

## 2022-04-20 LAB — HEMOGLOBIN A1C: Hgb A1c MFr Bld: 5.7 % (ref 4.6–6.5)

## 2022-04-20 MED ORDER — CYCLOBENZAPRINE HCL 5 MG PO TABS: 5.0000 mg | ORAL_TABLET | Freq: Every evening | ORAL | 0 refills | Status: DC | PRN

## 2022-04-20 MED ORDER — MELOXICAM 7.5 MG PO TABS: 7.5000 mg | ORAL_TABLET | Freq: Every day | ORAL | 0 refills | Status: DC

## 2022-04-20 NOTE — Addendum Note (Signed)
Addended by: Erline Hau on: 04/20/2022 08:10 AM   Modules accepted: Orders

## 2022-04-20 NOTE — Progress Notes (Addendum)
Established Patient Office Visit     CC/Reason for Visit: Annual preventive exam  HPI: Deborah Lawson is a 32 y.o. female who is coming in today for the above mentioned reasons. Past Medical History is significant for: Gestational diabetes and hypertension, vitamin D deficiency and iron deficiency anemia.  She is feeling well today.  As usual her visit is aided by an interpreter.  She has routine eye and dental care, she does not exercise routinely, she is overdue for COVID vaccination, she had a Pap smear in May 2022.   Past Medical/Surgical History: Past Medical History:  Diagnosis Date   Gestational diabetes    Gestational hypertension     Past Surgical History:  Procedure Laterality Date   CESAREAN SECTION N/A 10/26/2019   Procedure: CESAREAN SECTION;  Surgeon: Donnamae Jude, MD;  Location: [facility];  Service: Obstetrics;  Laterality: N/A;   MANDIBLE SURGERY      Social History:  reports that she has never smoked. She has never been exposed to tobacco smoke. She has never used smokeless tobacco. She reports that she does not drink alcohol and does not use drugs.  Allergies: No Known Allergies  Family History:  Family History  Problem Relation Age of Onset   Hypertension Father      Current Outpatient Medications:    cyclobenzaprine (FLEXERIL) 5 MG tablet, Take 1 tablet (5 mg total) by mouth at bedtime as needed for muscle spasms., Disp: 30 tablet, Rfl: 0   dicyclomine (BENTYL) 20 MG tablet, Take 1 tablet (20 mg total) by mouth 4 (four) times daily as needed (intestinal cramps)., Disp: 40 tablet, Rfl: 0   gabapentin (NEURONTIN) 300 MG capsule, Increase to 1 capsule in morning and 2 capsules at bedtime for one week, then increase to 2 capsules twice daily, Disp: 120 capsule, Rfl: 0   hydrOXYzine (ATARAX) 10 MG tablet, Take 1 tablet (10 mg total) by mouth at bedtime as needed for itching., Disp: 20 tablet, Rfl: 0   ketoconazole (NIZORAL) 2 % shampoo, APPLY 1  APPLICATION TOPICALLY 2 (TWO) TIMES A WEEK., Disp: 120 mL, Rfl: 0   meclizine (ANTIVERT) 25 MG tablet, Take 1 tablet (25 mg total) by mouth 3 (three) times daily as needed for dizziness (vertigo)., Disp: 30 tablet, Rfl: 0   meloxicam (MOBIC) 7.5 MG tablet, Take 1 tablet (7.5 mg total) by mouth daily., Disp: 30 tablet, Rfl: 0  Review of Systems:  Negative unless indicated in HPI.   Physical Exam: Vitals:   04/20/22 0746  BP: 110/80  Pulse: (!) 111  Temp: 97.8 F (36.6 C)  TempSrc: Oral  SpO2: 98%  Weight: 194 lb 1.6 oz (88 kg)  Height: 5\' 5"  (1.651 m)    Body mass index is 32.3 kg/m.   Physical Exam Vitals reviewed.  Constitutional:      General: She is not in acute distress.    Appearance: Normal appearance. She is not ill-appearing, toxic-appearing or diaphoretic.  HENT:     Head: Normocephalic.     Right Ear: Tympanic membrane, ear canal and external ear normal. There is no impacted cerumen.     Left Ear: Tympanic membrane, ear canal and external ear normal. There is no impacted cerumen.     Nose: Nose normal.     Mouth/Throat:     Mouth: Mucous membranes are moist.     Pharynx: Oropharynx is clear. No oropharyngeal exudate or posterior oropharyngeal erythema.  Eyes:     General:  No scleral icterus.       Right eye: No discharge.        Left eye: No discharge.     Conjunctiva/sclera: Conjunctivae normal.     Pupils: Pupils are equal, round, and reactive to light.  Neck:     Vascular: No carotid bruit.  Cardiovascular:     Rate and Rhythm: Normal rate and regular rhythm.     Pulses: Normal pulses.     Heart sounds: Normal heart sounds.  Pulmonary:     Effort: Pulmonary effort is normal. No respiratory distress.     Breath sounds: Normal breath sounds.  Abdominal:     General: Abdomen is flat. Bowel sounds are normal.     Palpations: Abdomen is soft.  Musculoskeletal:        General: Normal range of motion.     Cervical back: Normal range of motion.  Skin:     General: Skin is warm and dry.     Capillary Refill: Capillary refill takes less than 2 seconds.  Neurological:     General: No focal deficit present.     Mental Status: She is alert and oriented to person, place, and time. Mental status is at baseline.  Psychiatric:        Mood and Affect: Mood normal.        Behavior: Behavior normal.        Thought Content: Thought content normal.        Judgment: Judgment normal.     [place] Office Visit from 04/20/2022 in [facility] [place]  PHQ-9 Total Score 0       Impression and Plan:  Encounter for preventive health examination  Vitamin D deficiency - Plan: VITAMIN D 25 Hydroxy (Vit-D Deficiency, Fractures)  Iron deficiency - Plan: CBC with Differential/Platelet  Gestational diabetes mellitus (GDM), antepartum, gestational diabetes method of control unspecified - Plan: Lipid panel, TSH, Vitamin B12, Hemoglobin A1c  Gestational hypertension, antepartum - Plan: Comprehensive metabolic panel  -Recommend routine eye and dental care. -Healthy lifestyle discussed in detail. -Labs to be updated today. -Prostate cancer screening: Not applicable Health Maintenance  Topic Date Due   HIV Screening  Never done   Hepatitis C Screening: USPSTF Recommendation to screen - Ages 28-79 yo.  Never done   COVID-19 Vaccine (3 - 2023-24 season) 11/13/2021   Pap Smear  08/06/2023   DTaP/Tdap/Td vaccine (2 - Tdap) 11/09/2029   Flu Shot  Completed   HPV Vaccine  Aged Out    -Advised to update COVID-vaccine at [place], MD [facility]

## 2022-05-01 ENCOUNTER — Ambulatory Visit (HOSPITAL_BASED_OUTPATIENT_CLINIC_OR_DEPARTMENT_OTHER)
Admission: RE | Admit: 2022-05-01 | Discharge: 2022-05-01 | Disposition: A | Discharge status: Home / Self Care | Payer: Medicaid Other | Source: Ambulatory Visit | Attending: Internal Medicine | Admitting: Internal Medicine

## 2022-05-01 DIAGNOSIS — R1084 Generalized abdominal pain: Secondary | ICD-10-CM | POA: Insufficient documentation

## 2022-05-06 ENCOUNTER — Ambulatory Visit (HOSPITAL_COMMUNITY)
Admission: EM | Admit: 2022-05-06 | Discharge: 2022-05-06 | Disposition: A | Discharge status: Home / Self Care | Payer: Medicaid Other | Attending: Internal Medicine | Admitting: Internal Medicine

## 2022-05-06 ENCOUNTER — Encounter (HOSPITAL_COMMUNITY): Payer: Self-pay | Admitting: *Deleted

## 2022-05-06 DIAGNOSIS — Z1152 Encounter for screening for COVID-19: Secondary | ICD-10-CM | POA: Insufficient documentation

## 2022-05-06 DIAGNOSIS — R059 Cough, unspecified: Secondary | ICD-10-CM | POA: Insufficient documentation

## 2022-05-06 DIAGNOSIS — J069 Acute upper respiratory infection, unspecified: Secondary | ICD-10-CM | POA: Insufficient documentation

## 2022-05-06 DIAGNOSIS — R110 Nausea: Secondary | ICD-10-CM | POA: Diagnosis not present

## 2022-05-06 LAB — SARS CORONAVIRUS 2 (TAT 6-24 HRS): SARS Coronavirus 2: NEGATIVE

## 2022-05-06 MED ORDER — BENZONATATE 100 MG PO CAPS: 100.0000 mg | ORAL_CAPSULE | Freq: Three times a day (TID) | ORAL | 0 refills | Status: DC

## 2022-05-06 MED ORDER — ONDANSETRON 4 MG PO TBDP: 4.0000 mg | ORAL_TABLET | Freq: Three times a day (TID) | ORAL | 0 refills | Status: DC | PRN

## 2022-05-06 MED ORDER — IBUPROFEN 800 MG PO TABS
ORAL_TABLET | ORAL | Status: AC
  Filled 2022-05-06: qty 1

## 2022-05-06 MED ORDER — IBUPROFEN 800 MG PO TABS
800.0000 mg | ORAL_TABLET | Freq: Once | ORAL | Status: AC
  Administered 2022-05-06: 800 mg via ORAL

## 2022-05-06 NOTE — ED Provider Notes (Signed)
[facility]    CSN: [id number] Arrival date & time: 05/06/22  0803      History   Chief Complaint Chief Complaint  Patient presents with   Otalgia   Sore Throat    HPI Deborah Lawson is a 32 y.o. female.   Patient presents to urgent care for evaluation of right-sided ear pain, sore throat, runny nose, generalized fatigue, generalized headache, and cough that started 3 days ago.  She is not experiencing any dizziness, vision changes, fever/chills, abdominal pain, shortness of breath, chest pain, wheezing, or flank pain.  No known sick contacts with similar symptoms.  She denies history of chronic respiratory problems and is not a smoker/denies drug use.  Sore throat is worsened by swallowing.  No recent antibiotic or steroid use.  She is experiencing intermittent nausea without vomiting and has also had a few episodes of diarrhea in the last 3 days since becoming sick.  No blood/mucus in the stools.  She has been using ibuprofen at home to help with symptoms.  States that the ibuprofen has helped with the headache and the ear pain.  Last dose of ibuprofen was yesterday.  Denies chest pain, shortness of breath, and heart palpitations.  Currently afebrile without any antipyretic in her system.   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   Abnormal computed tomography of paranasal sinus 07/06/2021   Vitamin D deficiency 11/11/2020   Iron deficiency 11/11/2020   Gestational diabetes    Gestational hypertension    Language barrier 10/12/2019    Past Surgical History:  Procedure Laterality Date   CESAREAN SECTION N/A 10/26/2019   Procedure: CESAREAN SECTION;  Surgeon: Donnamae Jude, MD;  Location: [facility];  Service: Obstetrics;  Laterality: N/A;   MANDIBLE  SURGERY      OB History     Gravida  4   Para  3   Term  3   Preterm  0   AB  0   Living  3      SAB  0   IAB  0   Ectopic  0   Multiple  0   Live Births  3            Home Medications    Prior to Admission medications   Medication Sig Start Date End Date Taking? Authorizing Provider  benzonatate (TESSALON) 100 MG capsule Take 1 capsule (100 mg total) by mouth every 8 (eight) hours. 05/06/22  Yes Talbot Grumbling, FNP  gabapentin (NEURONTIN) 300 MG capsule Increase to 1 capsule in morning and 2 capsules at bedtime for one week, then increase to 2 capsules twice daily 01/08/22  Yes Jaffe, Adam R, DO  cyclobenzaprine (FLEXERIL) 5 MG tablet Take 1 tablet (5 mg total) by mouth at bedtime as needed for muscle spasms. 04/20/22   Isaac Bliss, Rayford Halsted, MD  dicyclomine (BENTYL) 20 MG tablet Take 1 tablet (20 mg total) by mouth 4 (four) times daily as needed (intestinal cramps). 01/11/22   Barrett Henle, MD  hydrOXYzine (ATARAX) 10 MG tablet Take 1 tablet (10 mg total) by mouth at bedtime as needed for itching. 01/28/22   Talbot Grumbling, FNP  ketoconazole (NIZORAL) 2 % shampoo APPLY 1 APPLICATION TOPICALLY  2 (TWO) TIMES A WEEK. 01/11/22   Shamleffer, Melanie Crazier, MD  meclizine (ANTIVERT) 25 MG tablet Take 1 tablet (25 mg total) by mouth 3 (three) times daily as needed for dizziness (vertigo). 11/27/21   Barrett Henle, MD  meloxicam (MOBIC) 7.5 MG tablet Take 1 tablet (7.5 mg total) by mouth daily. 04/20/22   Isaac Bliss, Rayford Halsted, MD  NIFEdipine (PROCARDIA XL) 30 MG 24 hr tablet Take 1 tablet (30 mg total) by mouth daily. 11/05/19 02/18/20  Julianne Handler, CNM    Family History Family History  Problem Relation Age of Onset   Hypertension Father     Social History Social History   Tobacco Use   Smoking status: Never    Passive exposure: Never   Smokeless tobacco: Never  Vaping Use   Vaping Use: Never used  Substance Use Topics    Alcohol use: Never   Drug use: Never     Allergies   Patient has no known allergies.   Review of Systems Review of Systems  HENT:  Positive for ear pain.   Per HPI   Physical Exam Triage Vital Signs ED Triage Vitals  Enc Vitals Group     BP 05/06/22 0821 127/88     Pulse Rate 05/06/22 0821 (!) 101     Resp 05/06/22 0821 18     Temp 05/06/22 0821 98.7 F (37.1 C)     Temp Source 05/06/22 0821 Oral     SpO2 05/06/22 0821 97 %     Weight --      Height --      Head Circumference --      Peak Flow --      Pain Score 05/06/22 0819 8     Pain Loc --      Pain Edu? --      Excl. in [place]? --    No data found.  Updated Vital Signs BP 127/88 (BP Location: Right Arm)   Pulse (!) 101   Temp 98.7 F (37.1 C) (Oral)   Resp 18   LMP 04/18/2022 (Exact Date)   SpO2 97%   Visual Acuity Right Eye Distance:   Left Eye Distance:   Bilateral Distance:    Right Eye Near:   Left Eye Near:    Bilateral Near:     Physical Exam Vitals and nursing note reviewed.  Constitutional:      Appearance: She is ill-appearing. She is not toxic-appearing.  HENT:     Head: Normocephalic and atraumatic.     Right Ear: Hearing, tympanic membrane, ear canal and external ear normal.     Left Ear: Hearing, tympanic membrane, ear canal and external ear normal.     Nose: Rhinorrhea present.     Mouth/Throat:     Lips: Pink.     Mouth: Mucous membranes are moist. No injury.     Tongue: No lesions. Tongue does not deviate from midline.     Palate: No mass and lesions.     Pharynx: Oropharynx is clear. Uvula midline. No pharyngeal swelling, oropharyngeal exudate, posterior oropharyngeal erythema or uvula swelling.     Tonsils: No tonsillar exudate or tonsillar abscesses.     Comments: Small amount of clear postnasal drainage visualized to the posterior oropharynx.  Eyes:     General: Lids are normal. Vision grossly intact. Gaze aligned appropriately.     Extraocular Movements: Extraocular  movements intact.     Conjunctiva/sclera: Conjunctivae normal.  Cardiovascular:  Rate and Rhythm: Regular rhythm. Tachycardia present.     Heart sounds: Normal heart sounds, S1 normal and S2 normal.  Pulmonary:     Effort: Pulmonary effort is normal. No respiratory distress.     Breath sounds: Normal breath sounds and air entry. No wheezing, rhonchi or rales.  Abdominal:     General: Bowel sounds are normal.     Palpations: Abdomen is soft.     Tenderness: There is no abdominal tenderness. There is no right CVA tenderness, left CVA tenderness or guarding.  Musculoskeletal:     Cervical back: Neck supple.  Skin:    General: Skin is warm and dry.     Capillary Refill: Capillary refill takes less than 2 seconds.     Findings: No rash.  Neurological:     General: No focal deficit present.     Mental Status: She is alert and oriented to person, place, and time. Mental status is at baseline.     Cranial Nerves: No dysarthria or facial asymmetry.  Psychiatric:        Mood and Affect: Mood normal.        Speech: Speech normal.        Behavior: Behavior normal.        Thought Content: Thought content normal.        Judgment: Judgment normal.      UC Treatments / Results  Labs (all labs ordered are listed, but only abnormal results are displayed) Labs Reviewed  SARS CORONAVIRUS 2 (TAT 6-24 HRS)    EKG   Radiology No results found.  Procedures Procedures (including critical care time)  Medications Ordered in UC Medications  ibuprofen (ADVIL) tablet 800 mg (800 mg Oral Given 05/06/22 0857)    Initial Impression / Assessment and Plan / UC Course  I have reviewed the triage vital signs and the nursing notes.  Pertinent labs & imaging results that were available during my care of the patient were reviewed by me and considered in my medical decision making (see chart for details).   1. Viral URI with cough Symptoms and physical exam consistent with a viral upper  respiratory tract infection that will likely resolve with rest, fluids, and prescriptions for symptomatic relief. Deferred imaging based on stable cardiopulmonary exam and hemodynamically stable vital signs. COVID-19 testing is pending.  We will call patient if this is positive.  Quarantine guidelines discussed. Currently on day 4 of symptoms and does not qualify for antiviral therapy.  Heart rate is 101 and regular.  She is without chest pain or heart palpitations.  Appears to be well-hydrated on physical exam.  Patient given ibuprofen in clinic today for headache.   Tessalon perles and Zofran sent to pharmacy for symptomatic relief to be taken as prescribed.  May continue taking over the counter medications as directed for further symptomatic relief. Nonpharmacologic interventions for symptom relief provided and after visit summary below. Advised to push fluids to stay well hydrated while recovering from viral illness.   Discussed physical exam and available lab work findings in clinic with patient.  Counseled patient regarding appropriate use of medications and potential side effects for all medications recommended or prescribed today. Discussed red flag signs and symptoms of worsening condition,when to call the PCP office, return to urgent care, and when to seek higher level of care in the emergency department. Patient verbalizes understanding and agreement with plan. All questions answered. Patient discharged in stable condition.    Final Clinical Impressions(s) / UC  Diagnoses   Final diagnoses:  Viral URI with cough     Discharge Instructions      You have a viral upper respiratory infection.  COVID-19 testing is pending. We will call you with results if positive. If your COVID test is positive, you must stay at home until day 6 of symptoms. On day 6, you may go out into public and go back to work, but you must wear a mask until day 11 of symptoms to prevent spread to others.  Use the  following medicines to help with symptoms: - Plain Mucinex (guaifenesin) over the counter as directed every 12 hours to thin mucous so that you are able to get it out of your body easier. Drink plenty of water while taking this medication so that it works well in your body (at least 8 cups a day).  - Tylenol 1,079m and/or ibuprofen 6010mevery 6 hours with food as needed for aches/pains or fever/chills.  - Tessalon perles every 8 hours as needed for cough. - Zyrtec 1 pill every 24 hours as needed for ear pressure and pain.   1 tablespoon of honey in warm water and/or salt water gargles may also help with symptoms. Humidifier to your room will help add water to the air and reduce coughing.  If you develop any new or worsening symptoms, please return.  If your symptoms are severe, please go to the emergency room.  Follow-up with your primary care provider for further evaluation and management of your symptoms as well as ongoing wellness visits.  I hope you feel better!   ED Prescriptions     Medication Sig Dispense Auth. Provider   benzonatate (TESSALON) 100 MG capsule Take 1 capsule (100 mg total) by mouth every 8 (eight) hours. 21 capsule StTalbot GrumblingFNP      PDMP not reviewed this encounter.   StJoella Prince,[place]owlerFNN[place]2/22/24 [id number]

## 2022-05-06 NOTE — Discharge Instructions (Addendum)
You have a viral upper respiratory infection.  COVID-19 testing is pending. We will call you with results if positive. If your COVID test is positive, you must stay at home until day 6 of symptoms. On day 6, you may go out into public and go back to work, but you must wear a mask until day 11 of symptoms to prevent spread to others.  Use the following medicines to help with symptoms: - Plain Mucinex (guaifenesin) over the counter as directed every 12 hours to thin mucous so that you are able to get it out of your body easier. Drink plenty of water while taking this medication so that it works well in your body (at least 8 cups a day).  - Tylenol 1,010m and/or ibuprofen 6046mevery 6 hours with food as needed for aches/pains or fever/chills.  - Tessalon perles every 8 hours as needed for cough. - Zyrtec 1 pill every 24 hours as needed for ear pressure and pain.   1 tablespoon of honey in warm water and/or salt water gargles may also help with symptoms. Humidifier to your room will help add water to the air and reduce coughing.  If you develop any new or worsening symptoms, please return.  If your symptoms are severe, please go to the emergency room.  Follow-up with your primary care provider for further evaluation and management of your symptoms as well as ongoing wellness visits.  I hope you feel better!

## 2022-05-06 NOTE — ED Triage Notes (Signed)
Pt states she has right ear pain, headache and sore throat X 3 days she has been taking IBU. She states yesterday she had diarrhea.

## 2022-05-18 ENCOUNTER — Telehealth: Payer: Self-pay | Admitting: Family Medicine

## 2022-05-18 ENCOUNTER — Ambulatory Visit (INDEPENDENT_AMBULATORY_CARE_PROVIDER_SITE_OTHER): Payer: Medicaid Other | Admitting: Internal Medicine

## 2022-05-18 ENCOUNTER — Encounter: Payer: Self-pay | Admitting: Internal Medicine

## 2022-05-18 VITALS — BP 108/70 | HR 112 | Temp 98.7°F | Wt 193.6 lb

## 2022-05-18 DIAGNOSIS — N912 Amenorrhea, unspecified: Secondary | ICD-10-CM

## 2022-05-18 DIAGNOSIS — Z3490 Encounter for supervision of normal pregnancy, unspecified, unspecified trimester: Secondary | ICD-10-CM

## 2022-05-18 DIAGNOSIS — R110 Nausea: Secondary | ICD-10-CM

## 2022-05-18 DIAGNOSIS — J069 Acute upper respiratory infection, unspecified: Secondary | ICD-10-CM | POA: Diagnosis not present

## 2022-05-18 LAB — POCT URINE PREGNANCY: Preg Test, Ur: POSITIVE — AB

## 2022-05-18 NOTE — Telephone Encounter (Signed)
Patient called in wanting to know if she can fast for [place]. Once I got the answer I called the patient back but there was no answer to the call. Nurse Apolonio Schneiders said "definitely we just usually encourage them to eat at when they can and stay as hydrated as possible!".

## 2022-05-18 NOTE — Progress Notes (Signed)
Established Patient Office Visit     CC/Reason for Visit: Continued ear pain  HPI: Deborah Lawson is a 32 y.o. female who is coming in today for the above mentioned reasons.  She was seen in the emergency department on February 22 diagnosed with a viral URI.  COVID test was negative.  She was advised to treat symptomatically.  She returns today with continued pain especially of the right ear.  But she continues to have a sore throat as well.  She also mentions that she is about 10 days late for her menstrual cycle.  She had a miscarriage last year.   Past Medical/Surgical History: Past Medical History:  Diagnosis Date   Gestational diabetes    Gestational hypertension     Past Surgical History:  Procedure Laterality Date   CESAREAN SECTION N/A 10/26/2019   Procedure: CESAREAN SECTION;  Surgeon: Donnamae Jude, MD;  Location: [facility];  Service: Obstetrics;  Laterality: N/A;   MANDIBLE SURGERY      Social History:  reports that she has never smoked. She has never been exposed to tobacco smoke. She has never used smokeless tobacco. She reports that she does not drink alcohol and does not use drugs.  Allergies: No Known Allergies  Family History:  Family History  Problem Relation Age of Onset   Hypertension Father      Current Outpatient Medications:    cyclobenzaprine (FLEXERIL) 5 MG tablet, Take 1 tablet (5 mg total) by mouth at bedtime as needed for muscle spasms., Disp: 30 tablet, Rfl: 0   dicyclomine (BENTYL) 20 MG tablet, Take 1 tablet (20 mg total) by mouth 4 (four) times daily as needed (intestinal cramps)., Disp: 40 tablet, Rfl: 0   gabapentin (NEURONTIN) 300 MG capsule, Increase to 1 capsule in morning and 2 capsules at bedtime for one week, then increase to 2 capsules twice daily, Disp: 120 capsule, Rfl: 0   hydrOXYzine (ATARAX) 10 MG tablet, Take 1 tablet (10 mg total) by mouth at bedtime as needed for itching., Disp: 20 tablet, Rfl: 0   ketoconazole  (NIZORAL) 2 % shampoo, APPLY 1 APPLICATION TOPICALLY 2 (TWO) TIMES A WEEK., Disp: 120 mL, Rfl: 0   meclizine (ANTIVERT) 25 MG tablet, Take 1 tablet (25 mg total) by mouth 3 (three) times daily as needed for dizziness (vertigo)., Disp: 30 tablet, Rfl: 0   meloxicam (MOBIC) 7.5 MG tablet, Take 1 tablet (7.5 mg total) by mouth daily., Disp: 30 tablet, Rfl: 0   ondansetron (ZOFRAN-ODT) 4 MG disintegrating tablet, Take 1 tablet (4 mg total) by mouth every 8 (eight) hours as needed for nausea or vomiting., Disp: 20 tablet, Rfl: 0  Review of Systems:  Negative unless indicated in HPI.   Physical Exam: Vitals:   05/18/22 0716  BP: 108/70  Pulse: (!) 112  Temp: 98.7 F (37.1 C)  TempSrc: Oral  SpO2: 99%  Weight: 193 lb 9.6 oz (87.8 kg)    Body mass index is 32.22 kg/m.   Physical Exam Vitals reviewed.  Constitutional:      Appearance: Normal appearance.  HENT:     Right Ear: Tympanic membrane, ear canal and external ear normal.     Left Ear: Tympanic membrane, ear canal and external ear normal.     Mouth/Throat:     Mouth: Mucous membranes are moist.     Pharynx: Oropharynx is clear.  Eyes:     Conjunctiva/sclera: Conjunctivae normal.     Pupils: Pupils are  equal, round, and reactive to light.  Cardiovascular:     Rate and Rhythm: Normal rate and regular rhythm.  Pulmonary:     Effort: Pulmonary effort is normal.     Breath sounds: Normal breath sounds.  Neurological:     Mental Status: She is alert.      Impression and Plan:  Pregnancy, unspecified gestational age - Plan: Ambulatory referral to Obstetrics / Gynecology  Amenorrhea - Plan: Ambulatory referral to Obstetrics / Gynecology  Nausea - Plan: POCT urine pregnancy, Ambulatory referral to Obstetrics / Gynecology  Viral upper respiratory tract infection  -Given exam findings, PNA, pharyngitis, ear infection are not likely, hence abx have not been prescribed. -Have advised rest, fluids, OTC pain relievers. -RTC  if no improvement in 10-14 days.   -She is about 10 days late for her menstrual cycle.  In office point-of-care urine pregnancy test is positive.  I have counseled her on importance of taking prenatal vitamins.  We have discussed only using Tylenol over-the-counter.  I will make her an appointment with OB.  Time spent:30 minutes reviewing chart, interviewing and examining patient and formulating plan of care.     Lelon Frohlich, MD [facility]

## 2022-05-28 ENCOUNTER — Inpatient Hospital Stay (HOSPITAL_COMMUNITY): Payer: Medicaid Other

## 2022-05-28 ENCOUNTER — Inpatient Hospital Stay (HOSPITAL_COMMUNITY)
Admission: AD | Admit: 2022-05-28 | Discharge: 2022-05-28 | Disposition: A | Discharge status: Home / Self Care | Payer: Medicaid Other | Attending: Obstetrics and Gynecology | Admitting: Obstetrics and Gynecology

## 2022-05-28 ENCOUNTER — Encounter (HOSPITAL_COMMUNITY): Payer: Self-pay

## 2022-05-28 DIAGNOSIS — Z791 Long term (current) use of non-steroidal anti-inflammatories (NSAID): Secondary | ICD-10-CM | POA: Insufficient documentation

## 2022-05-28 DIAGNOSIS — R102 Pelvic and perineal pain: Secondary | ICD-10-CM | POA: Diagnosis not present

## 2022-05-28 DIAGNOSIS — O26891 Other specified pregnancy related conditions, first trimester: Secondary | ICD-10-CM | POA: Diagnosis present

## 2022-05-28 DIAGNOSIS — Z3A01 Less than 8 weeks gestation of pregnancy: Secondary | ICD-10-CM | POA: Diagnosis not present

## 2022-05-28 DIAGNOSIS — R109 Unspecified abdominal pain: Secondary | ICD-10-CM

## 2022-05-28 DIAGNOSIS — Z3490 Encounter for supervision of normal pregnancy, unspecified, unspecified trimester: Secondary | ICD-10-CM

## 2022-05-28 DIAGNOSIS — Z6740 Type O blood, Rh positive: Secondary | ICD-10-CM

## 2022-05-28 LAB — CBC
HCT: 36.7 % (ref 36.0–46.0)
Hemoglobin: 12.4 g/dL (ref 12.0–15.0)
MCH: 27.2 pg (ref 26.0–34.0)
MCHC: 33.8 g/dL (ref 30.0–36.0)
MCV: 80.5 fL (ref 80.0–100.0)
Platelets: 449 10*3/uL — ABNORMAL HIGH (ref 150–400)
RBC: 4.56 MIL/uL (ref 3.87–5.11)
RDW: 14.6 % (ref 11.5–15.5)
WBC: 10.4 10*3/uL (ref 4.0–10.5)
nRBC: 0 % (ref 0.0–0.2)

## 2022-05-28 LAB — URINALYSIS, ROUTINE W REFLEX MICROSCOPIC
Bilirubin Urine: NEGATIVE
Glucose, UA: NEGATIVE mg/dL
Hgb urine dipstick: NEGATIVE
Ketones, ur: NEGATIVE mg/dL
Nitrite: NEGATIVE
Protein, ur: NEGATIVE mg/dL
Specific Gravity, Urine: 1.002 — ABNORMAL LOW (ref 1.005–1.030)
WBC, UA: >50 WBC/hpf (ref 0–5)
pH: 6 (ref 5.0–8.0)

## 2022-05-28 LAB — HCG, QUANTITATIVE, PREGNANCY: hCG, Beta Chain, Quant, S: 6710 m[IU]/mL — ABNORMAL HIGH (ref <5)

## 2022-05-28 MED ORDER — PROMETHAZINE HCL 25 MG PO TABS: 25.0000 mg | ORAL_TABLET | Freq: Four times a day (QID) | ORAL | 0 refills | Status: DC | PRN

## 2022-05-28 MED ORDER — PRENATAL PLUS VITAMIN/MINERAL 27-1 MG PO TABS: 1.0000 | ORAL_TABLET | Freq: Every day | ORAL | 11 refills | Status: DC

## 2022-05-28 NOTE — MAU Note (Signed)
..  Deborah Lawson is a 32 y.o. at [redacted]w[redacted]d here in MAU reporting: lower abdominal cramping that began 4 days ago. Has been taking tylenol for the pain, but it only helps for a little, last dose was at 1830. Denies vaginal bleeding.  LMP: 04/18/2022 Onset of complaint: 4 days  Pain score: 3/10 Vitals:   05/28/22 2051  BP: 131/83  Pulse: 93  Resp: (!) 23  Temp: 98.4 F (36.9 C)  SpO2: 100%     FHT:not indicated Lab orders placed from triage:  UA

## 2022-05-28 NOTE — MAU Provider Note (Signed)
History     CSN: [id number]  Arrival date and time: 05/28/22 2027   None     Chief Complaint  Patient presents with   Abdominal Pain   Deborah Lawson is a 32 y.o. a 32 y.o. [id number] at [redacted]w[redacted]d who presents today with lower abdominal pain that started when she started fasting for Ramadan. She noticed her urine was dark, so the next day she tried to increase hydration during the times she could and her urine was normal color. The pain was better when she did that. She has had some nausea, but no vomiting.   Abdominal Pain This is a new problem. Episode onset: about 4 days ago when she started fasting for Ramadan. The onset quality is gradual. The problem occurs intermittently. The problem has been unchanged. The pain is located in the suprapubic region. The pain is mild. The quality of the pain is colicky and cramping. The abdominal pain does not radiate. The pain is relieved by Nothing. She has tried nothing for the symptoms.    OB History     Gravida  5   Para  3   Term  3   Preterm  0   AB  0   Living  3      SAB  0   IAB  0   Ectopic  0   Multiple  0   Live Births  3           Past Medical History:  Diagnosis Date   Gestational diabetes    Gestational hypertension     Past Surgical History:  Procedure Laterality Date   CESAREAN SECTION N/A 10/26/2019   Procedure: CESAREAN SECTION;  Surgeon: Donnamae Jude, MD;  Location: [facility];  Service: Obstetrics;  Laterality: N/A;   MANDIBLE SURGERY      Family History  Problem Relation Age of Onset   Hypertension Father     Social History   Tobacco Use   Smoking status: Never    Passive exposure: Never   Smokeless tobacco: Never  Vaping Use   Vaping Use: Never used  Substance Use Topics   Alcohol use: Never   Drug use: Never    Allergies: No Known Allergies  Medications Prior to Admission  Medication Sig Dispense Refill Last Dose   cholecalciferol (VITAMIN D3) 25 MCG (1000 UNIT) tablet  Take 1,000 Units by mouth daily.   05/28/2022   ferrous sulfate 324 MG TBEC Take 324 mg by mouth.   05/27/2022   gabapentin (NEURONTIN) 300 MG capsule Increase to 1 capsule in morning and 2 capsules at bedtime for one week, then increase to 2 capsules twice daily 120 capsule 0 05/27/2022   cyclobenzaprine (FLEXERIL) 5 MG tablet Take 1 tablet (5 mg total) by mouth at bedtime as needed for muscle spasms. 30 tablet 0    dicyclomine (BENTYL) 20 MG tablet Take 1 tablet (20 mg total) by mouth 4 (four) times daily as needed (intestinal cramps). 40 tablet 0    hydrOXYzine (ATARAX) 10 MG tablet Take 1 tablet (10 mg total) by mouth at bedtime as needed for itching. 20 tablet 0    ketoconazole (NIZORAL) 2 % shampoo APPLY 1 APPLICATION TOPICALLY 2 (TWO) TIMES A WEEK. 120 mL 0    meclizine (ANTIVERT) 25 MG tablet Take 1 tablet (25 mg total) by mouth 3 (three) times daily as needed for dizziness (vertigo). 30 tablet 0    meloxicam (MOBIC) 7.5 MG tablet Take 1 tablet (  7.5 mg total) by mouth daily. 30 tablet 0    ondansetron (ZOFRAN-ODT) 4 MG disintegrating tablet Take 1 tablet (4 mg total) by mouth every 8 (eight) hours as needed for nausea or vomiting. 20 tablet 0     Review of Systems  Gastrointestinal:  Positive for abdominal pain.  All other systems reviewed and are negative.  Physical Exam   Blood pressure 131/83, pulse 93, temperature 98.4 F (36.9 C), temperature source Oral, resp. rate (!) 23, height 5\' 5"  (1.651 m), weight 87.1 kg, last menstrual period 04/18/2022, SpO2 100 %.  Physical Exam Vitals and nursing note reviewed.  Constitutional:      General: She is not in acute distress. HENT:     Head: Normocephalic.     Mouth/Throat:     Mouth: Mucous membranes are moist.  Eyes:     Pupils: Pupils are equal, round, and reactive to light.  Cardiovascular:     Rate and Rhythm: Normal rate.  Pulmonary:     Effort: Pulmonary effort is normal.  Abdominal:     Palpations: Abdomen is soft.      Tenderness: There is no abdominal tenderness.  Skin:    General: Skin is warm and dry.  Neurological:     Mental Status: She is alert and oriented to person, place, and time.  Psychiatric:        Mood and Affect: Mood normal.        Behavior: Behavior normal.      Results for orders placed or performed during the hospital encounter of 05/28/22 (from the past 24 hour(s))  Urinalysis, Routine w reflex microscopic -Urine, Clean Catch     Status: Abnormal   Collection Time: 05/28/22  9:14 PM  Result Value Ref Range   Color, Urine YELLOW YELLOW   APPearance CLOUDY (A) CLEAR   Specific Gravity, Urine 1.002 (L) 1.005 - 1.030   pH 6.0 5.0 - 8.0   Glucose, UA NEGATIVE NEGATIVE mg/dL   Hgb urine dipstick NEGATIVE NEGATIVE   Bilirubin Urine NEGATIVE NEGATIVE   Ketones, ur NEGATIVE NEGATIVE mg/dL   Protein, ur NEGATIVE NEGATIVE mg/dL   Nitrite NEGATIVE NEGATIVE   Leukocytes,Ua LARGE (A) NEGATIVE   RBC / HPF 0-5 0 - 5 RBC/hpf   WBC, UA >50 0 - 5 WBC/hpf   Bacteria, UA RARE (A) NONE SEEN   Squamous Epithelial / HPF 11-20 0 - 5 /HPF   Mucus PRESENT   CBC     Status: Abnormal   Collection Time: 05/28/22  9:25 PM  Result Value Ref Range   WBC 10.4 4.0 - 10.5 K/uL   RBC 4.56 3.87 - 5.11 MIL/uL   Hemoglobin 12.4 12.0 - 15.0 g/dL   HCT 36.7 36.0 - 46.0 %   MCV 80.5 80.0 - 100.0 fL   MCH 27.2 26.0 - 34.0 pg   MCHC 33.8 30.0 - 36.0 g/dL   RDW 14.6 11.5 - 15.5 %   Platelets 449 (H) 150 - 400 K/uL   nRBC 0.0 0.0 - 0.2 %   US OB LESS THAN 14 WEEKS WITH OB TRANSVAGINAL  Result Date: 05/28/2022 CLINICAL DATA:  Abdominal pain. EXAM: OBSTETRIC <14 WK [place] AND TRANSVAGINAL OB US TECHNIQUE: Both transabdominal and transvaginal ultrasound examinations were performed for complete evaluation of the gestation as well as the maternal uterus, adnexal regions, and pelvic cul-de-sac. Transvaginal technique was performed to assess early pregnancy. COMPARISON:  None Available. FINDINGS: Intrauterine gestational  sac: Single, with a small amount of fluid  seen within the endometrial canal Yolk sac:  Visualized. Embryo:  Not Visualized. Cardiac Activity: Not Visualized. Heart Rate: N/A  bpm MSD: 7.8 mm   5 w   4 d Subchorionic hemorrhage:  None visualized. Maternal uterus/adnexae: The right ovary is not clearly visualized. The left ovary is visualized and is normal in appearance. A trace amount of pelvic free fluid is noted. IMPRESSION: 1. Probable early intrauterine gestational sac and yolk sac, but no fetal pole or cardiac activity yet visualized. Recommend follow-up quantitative B-HCG levels and follow-up US in 14 days to assess viability. This recommendation follows SRU consensus guidelines: Diagnostic Criteria for Nonviable Pregnancy Early in the First Trimester. [employer] Med [id number]. 2. Small amount of fluid within the endometrial canal. Electronically Signed   By: Virgina Norfolk M.D.   On: 05/28/2022 22:03    MAU Course  Procedures  MDM   Assessment and Plan   1. Intrauterine pregnancy   2. [redacted] weeks gestation of pregnancy   3. Type O blood, Rh positive    DC home in stable condition  1st Trimester precautions  Bleeding precautions RX: phenergan PRN #30, Prenatal vitamins q day #30, 11RF  Return to MAU as needed Start prenatal care as planned FU viability scan in 2 weeks    [place] [facility] [place] [facility] Follow up.   Specialty: Obstetrics and Gynecology Why: As scheduled Contact information: [place] [phone]        [facility]   Specialty: Radiology Why: They will call you with an appointment Contact information: [address] [place] [phone]               Shiloh Swopes DNP, CNM  05/28/22  10:15 PM

## 2022-05-28 NOTE — MAU Note (Addendum)
..  Deborah Lawson is a 32 y.o. at [redacted]w[redacted]d here in MAU reporting: lower abdominal cramping that began 4 days ago. Has been taking tylenol for the pain, but it only helps for a little, last dose was at 1830. Denies vaginal bleeding.  LMP: 04/18/2022 Onset of complaint: 4 days  Pain score: 3/10 Vitals:   05/28/22 2051  BP: 131/83  Pulse: 93  Resp: (!) 23  Temp: 98.4 F (36.9 C)  SpO2: 100%     FHT:not indicated Lab orders placed from triage:  UA

## 2022-05-29 ENCOUNTER — Other Ambulatory Visit: Payer: Self-pay | Admitting: Internal Medicine

## 2022-05-29 DIAGNOSIS — R0789 Other chest pain: Secondary | ICD-10-CM

## 2022-05-29 DIAGNOSIS — M549 Dorsalgia, unspecified: Secondary | ICD-10-CM

## 2022-06-08 NOTE — Progress Notes (Unsigned)
NEUROLOGY FOLLOW UP OFFICE NOTE  [name] [id number]  Assessment/Plan:   1  Atypical left sided headache/facial pain- description sounds cervicogenic but no structural abnormalities on cervical spine MRI.  MRI of brain unrevealing as well 2  Left sided arm pain/numbness - nothing on MRI of cervical spine to explain symptoms 3  acute on chronic low back pain with left lumbar radiculopathy     Titrate gabapentin to 600mg  twice daily Refer to physical therapy for low back pain with lumbar radiculopathy NCV-EMG of left upper extremity Follow up 4-5 months. Patient inquired about checking another image of the brain since she hit her head and her headache/pain is worse.  I explained that I didn't think repeat imaging was warranted.  She is describing her chronic symptoms and her exam is objectively unremarkable.     Subjective:  Deborah Lawson is a 32 year old female who follows up for atypical facial pain.  She is accompanied by Arabic speaking interpreter   UPDATE: NCV-EMG of left upper extremity on 02/11/2022 was normal.   Gabapentin was titrated to 600mg  BID.  ***  She was referred to PT for low back pain/radiculopathy.  ***  She is now ***   Current NSAIDS/analgesics:  *** Current triptans:  none Current ergotamine:  none Current anti-emetic:  none Current muscle relaxants:  none Current Antihypertensive medications:  none Current Antidepressant medications:  none Current Anticonvulsant medications:  none Current anti-CGRP:  none Current Vitamins/Herbal/Supplements:  none Current Antihistamines/Decongestants:  none Other therapy:  none Hormone/birth control:  none   HISTORY: She started having a headache around January  No prior history of headache.  It is a moderate to severe left sided pressure/aching pain involving the left temple, eye, ear and face, including along the jaw and behind TMJ.  Sometimes with associated left sided/occipital numbness and tingling.   It comes and goes during the day the pain lessens and resolves by next day.  They occurs when she feels stressed or depressed, about 3 to 4 days a week.  Sometimes associated neck pain.  No associated nausea, vomiting, photophobia, phonophobia, visual disturbance, autonomic symptoms, numbness or weakness.  She went to the ED where she was diagnosed with a cavity.  She had the tooth pulled but pain persisted.  She was seen in the ED on 04/23/2020 where CT head personally reviewed was unremarkable and she was treated with headache cocktail.  She returned to the ED on 05/31/2020 for left sided TMJ pain where she was prescribed a prednisone taper and instructed to take Motrin as needed.    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  She was referred to endocrinology. She saw the dentist and had the cavity treated. Noted some improvement.  .  Referred to PT for neck/TMJ pain.  Still with neck pain radiating down left arm.  In March 2023, she was treated for left otitis media.  Still has left ear pain..  Treated for sinusitis and left otitis media.  Still with pain in left ear.  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  CT sinuses on 09/21/2021 revealed resolution of previous left maxillary sinusitis. She saw endocrinology regarding the pituitary microadenoma with normal workup.  Due to ongoing cervical radiculitis, MRI of cervical spine  was performed on 07/22/2021, which showed mild cervical spondylosis but no significant spinal canal or foraminal stenosis.  She has history of chronic low back pain radiating down the left leg.  This past week, she has worsening left leg pain with anterior numbness from the thigh down to the foot.  No trauma.     Past  NSAIDS/analgesics:  ibuprofen Past abortive triptans:  none Past abortive ergotamine:  none Past muscle relaxants:  none Past anti-emetic:  none Past antihypertensive medications:  nifedipine Past antidepressant medications:  none Past anticonvulsant medications:  gabapentin 600mg  BID (stopped due to pregnancy) Past anti-CGRP:  none Past vitamins/Herbal/Supplements:  none Past antihistamines/decongestants:  none Other past therapies:  none  PAST MEDICAL HISTORY: Past Medical History:  Diagnosis Date   Gestational diabetes    Gestational hypertension     MEDICATIONS: Current Outpatient Medications on File Prior to Visit  Medication Sig Dispense Refill   cholecalciferol (VITAMIN D3) 25 MCG (1000 UNIT) tablet Take 1,000 Units by mouth daily.     cyclobenzaprine (FLEXERIL) 5 MG tablet Take 1 tablet (5 mg total) by mouth at bedtime as needed for muscle spasms. 30 tablet 0   dicyclomine (BENTYL) 20 MG tablet Take 1 tablet (20 mg total) by mouth 4 (four) times daily as needed (intestinal cramps). 40 tablet 0   ferrous sulfate 324 MG TBEC Take 324 mg by mouth.     gabapentin (NEURONTIN) 300 MG capsule Increase to 1 capsule in morning and 2 capsules at bedtime for one week, then increase to 2 capsules twice daily 120 capsule 0   hydrOXYzine (ATARAX) 10 MG tablet Take 1 tablet (10 mg total) by mouth at bedtime as needed for itching. 20 tablet 0   ketoconazole (NIZORAL) 2 % shampoo APPLY 1 APPLICATION TOPICALLY 2 (TWO) TIMES A WEEK. 120 mL 0   meclizine (ANTIVERT) 25 MG tablet Take 1 tablet (25 mg total) by mouth 3 (three) times daily as needed for dizziness (vertigo). 30 tablet 0   meloxicam (MOBIC) 7.5 MG tablet Take 1 tablet (7.5 mg total) by mouth daily. 30 tablet 0   ondansetron (ZOFRAN-ODT) 4 MG disintegrating tablet Take 1 tablet (4 mg total) by mouth every 8 (eight) hours as needed for nausea or vomiting. 20 tablet 0   Prenatal Vit-Fe Fumarate-FA (PRENATAL PLUS VITAMIN/MINERAL) 27-1 MG  TABS Take 1 tablet by mouth daily. 30 tablet 11   promethazine (PHENERGAN) 25 MG tablet Take 1 tablet (25 mg total) by mouth every 6 (six) hours as needed for nausea or vomiting. 30 tablet 0   [DISCONTINUED] NIFEdipine (PROCARDIA XL) 30 MG 24 hr tablet Take 1 tablet (30 mg total) by mouth daily. 30 tablet 1   No current facility-administered medications on file prior to visit.    ALLERGIES: No Known Allergies  FAMILY HISTORY: Family History  Problem Relation Age of Onset   Hypertension Father       Objective:  *** General: No acute distress.  Patient appears well-groomed.   Head:  Normocephalic/atraumatic Eyes:  Fundi examined but not visualized Neck: supple, left sided paraspinal tenderness, full range of motion Neurological Exam: ***   Metta Clines, DO  CC: Domingo Mend Acosta,MD

## 2022-06-09 ENCOUNTER — Ambulatory Visit: Payer: Medicaid Other | Admitting: Neurology

## 2022-06-09 ENCOUNTER — Telehealth: Payer: Self-pay

## 2022-06-09 ENCOUNTER — Encounter: Payer: Self-pay | Admitting: Neurology

## 2022-06-09 VITALS — BP 114/71 | HR 99 | Ht 65.0 in | Wt 194.4 lb

## 2022-06-09 DIAGNOSIS — R519 Headache, unspecified: Secondary | ICD-10-CM | POA: Diagnosis not present

## 2022-06-09 DIAGNOSIS — M542 Cervicalgia: Secondary | ICD-10-CM | POA: Diagnosis not present

## 2022-06-09 DIAGNOSIS — Z3A08 8 weeks gestation of pregnancy: Secondary | ICD-10-CM

## 2022-06-09 NOTE — Patient Instructions (Signed)
Stop gabapentin until I can discuss with obstetrician.  I will also find out when it would be okay to get an MRI In meantime, may use acetaminophen but try to limit to no more than 2 days out of week Follow up 4 months.

## 2022-06-09 NOTE — Telephone Encounter (Signed)
Patient advised of Dr.Jaffe note below.  

## 2022-06-09 NOTE — Telephone Encounter (Signed)
Per Dr.Jaffe, I spoke with OBGYN and they recommend that she should not take gabapentin right now.  Tried call patient no answer. LMOVM by translator.

## 2022-06-10 ENCOUNTER — Encounter: Payer: Self-pay | Admitting: Neurology

## 2022-06-15 ENCOUNTER — Ambulatory Visit: Payer: Medicaid Other | Admitting: Family Medicine

## 2022-06-17 ENCOUNTER — Other Ambulatory Visit: Payer: Medicaid Other

## 2022-06-23 ENCOUNTER — Ambulatory Visit: Payer: Medicaid Other | Admitting: Internal Medicine

## 2022-06-23 ENCOUNTER — Telehealth (INDEPENDENT_AMBULATORY_CARE_PROVIDER_SITE_OTHER): Payer: Medicaid Other

## 2022-06-23 DIAGNOSIS — O0990 Supervision of high risk pregnancy, unspecified, unspecified trimester: Secondary | ICD-10-CM | POA: Insufficient documentation

## 2022-06-23 DIAGNOSIS — Z3689 Encounter for other specified antenatal screening: Secondary | ICD-10-CM

## 2022-06-23 MED ORDER — BLOOD PRESSURE KIT DEVI: 1.0000 | 0 refills | Status: DC | PRN

## 2022-06-23 NOTE — Patient Instructions (Signed)
Safe Medications in Pregnancy   Acne:  Benzoyl Peroxide  Salicylic Acid   Backache/Headache:  Tylenol: 2 regular strength every 4 hours OR               2 Extra strength every 6 hours   Colds/Coughs/Allergies:  Benadryl (alcohol free) 25 mg every 6 hours as needed  Breath right strips  Claritin  Cepacol throat lozenges  Chloraseptic throat spray  Cold-Eeze- up to three times per day  Cough drops, alcohol free  Flonase (by prescription only)  Guaifenesin  Mucinex  Robitussin DM (plain only, alcohol free)  Saline nasal spray/drops  Sudafed (pseudoephedrine) & Actifed * use only after [redacted] weeks gestation and if you do not have high blood pressure  Tylenol  Vicks Vaporub  Zinc lozenges  Zyrtec   Constipation:  Colace  Ducolax suppositories  Fleet enema  Glycerin suppositories  Metamucil  Milk of magnesia  Miralax  Senokot  Smooth move tea   Diarrhea:  Kaopectate  Imodium A-D   *NO pepto Bismol   Hemorrhoids:  Anusol  Anusol HC  Preparation H  Tucks   Indigestion:  Tums  Maalox  Mylanta  Zantac  Pepcid   Insomnia:  Benadryl (alcohol free) 25mg every 6 hours as needed  Tylenol PM  Unisom, no Gelcaps   Leg Cramps:  Tums  MagGel   Nausea/Vomiting:  Bonine  Dramamine  Emetrol  Ginger extract  Sea bands  Meclizine  Nausea medication to take during pregnancy:  Unisom (doxylamine succinate 25 mg tablets) Take one tablet daily at bedtime. If symptoms are not adequately controlled, the dose can be increased to a maximum recommended dose of two tablets daily (1/2 tablet in the morning, 1/2 tablet mid-afternoon and one at bedtime).  Vitamin B6 100mg tablets. Take one tablet twice a day (up to 200 mg per day).   Skin Rashes:  Aveeno products  Benadryl cream or 25mg every 6 hours as needed  Calamine Lotion  1% cortisone cream   Yeast infection:  Gyne-lotrimin 7  Monistat 7    **If taking multiple medications, please check labels to avoid  duplicating the same active ingredients  **take medication as directed on the label  ** Do not exceed 4000 mg of tylenol in 24 hours  **Do not take medications that contain aspirin or ibuprofen            [place] Pediatric Providers  [place] ([address]) [facility] Brown, MD; Chambliss, MD; Eniola, MD; Hensel, MD; McDiarmid, MD; McIntyer, MD [address] [place], [place] [address] [phone] Mon-Fri 8:30-12:30, 1:30-5:00  Providers come to see babies during newborn hospitalization Only accepting infants of Mother's who are seen at Family Medicine Center or have siblings seen at   Family [facility] Center Medicaid - Yes; Tricare - Yes   [facility] Mulberry, MD [address] [place], [place] [address] [phone] Mon, Tue, Thur, Fri 8:30-5:00, Wed 10:00-7:00 (closed 1-2pm daily for lunch) Takes [place] residents with no insurance.  [place] only with Medicaid/insurance; Tricare - no  [facility] Ben-Davies, MD; Brown, MD; Chandler, MD; Ettefagh, MD; Grant, MD; Hanvey, MD; Herrin, MD; Jones,  MD; Lester, MD; McCormick, MD; McQueen, MD; Simha, MD; Stanley, MD; Stryffeler, NP [address], [place], [place] [address] [phone] Mon, Tue, Thur, Fri 8:30-5:30, Wed 9:30-5:30, Sat 8:30-12:30 Only accepting infants of first-time parents or siblings of current   patients Hospital discharge coordinator will make follow-up appointment Medicaid - yes; Tricare - yes  [place] ([address]) [facility] Cox, MD; Davis, MD; Dovico, MD; Ettefaugh, MD; Lowe, MD; Nation, MD; Slimp, MD; Sumner, MD; Williams, MD [address], [place], [place] [address] [phone] Mon-Fri 8:30-5:00, closed for lunch 12:30-1:30; Sat-Sun 10:00-1:00 Accepting Newborns with commercial insurance only, must call prior to  delivery to be accepted into  practice.  Medicaid - no, Tricare - yes   [facility] [address] [place], [place] [address] [phone] or [phone] Mon to Fri 8am to 10pm, Sat 8am to 1pm (virtual only on weekends) Only accepts Medicaid Healthy Blue pts  [facility]  Artis, MD; Coccaro, MD; [place] Gardner, MD; Netherton, NP; Roper, MD; Wilmot, PA-C; Skinner, MD [address] [place], [place] [address] [phone] Mon-Fri 8:30-5:30 Medicaid - yes, Tricare - yes  [place] ([address]) [facility] Warner, MD [address], [place], [place] [address] [phone] Mon, Tues, Wed Fri 8:30-5:00, Sat 8:30-12:00, Closed Thursdays Accepting siblings of established patients and first time mom's if you call prenatally Medicaid- yes; Tricare - yes  [facility] Becker, PA; Hagler, MD; Quinn, PA-C; Scifres, PA; Sun, MD; Swayne, MD;  [address], [place], [place] [address] [phone] Mon-Fri 8:30-5:00, closed for lunch 1-2 Only accepting newborns of established patients Medicaid- no; Tricare - yes  [place] ([address]) [facility] Timberlake, MD; [address], [place], [place] [address] [phone] Mon-Fri 8:00-5:00 Medicaid - No; Tricare - Yes  [facility] [employer]  Brake, NP; Wharton, PA [address], [place], [place] [address] [phone] Mon-Fri 8:00-5:00 Medicaid - No, Tricare - Yes  [facility] Gay, MD; Quinlan, MD; Blatt, DNP [address] [place], [place] [address] [phone]  Mon-Fri 8:00-5:00 Medicaid - No; Tricare - Yes  [facility] [address] [place], [place] [address] [phone] Mon-Fri 8:30-5:00 (lunch 12:00-1:00) Medicaid -Yes; Tricare - Yes  [facility] Jordan, MD [address],  [place], [place] [address] [phone] Mon-Fri 8:00-5:00 Seeing newborns of current patients only. No new patients Medicaid - No, Tricare - yes  [facility] Parker, MD [address] [place], [place] [address] [phone] Mon-Fri 8:00-5:00 Medicaid -yes as secondary coverage only; Tricare - yes  [facility] Brecken, PA; Christy, NP; Dees, MD; DeClaire, MD; DeWeese, MD; Hodge, PA; Smoot, NP; Summer, MD; Vapne, MD [address] [place], [place] [address] [phone] Mon-Fri 8:30-5:00, Sat 9:00-11:00 Accepts commercial insurance ONLY. Offers free prenatal information sessions for families. Medicaid - No, Tricare - Call first  [facility] Bouska, MD; Gordon, PA; Jeffery, PA; Weber, PA [address] [place] [address] [phone] Mon-Fri 7:30-5:30 Medicaid - Yes; Tricare - yes  [place] ([address] & [address])  [facility] Reese, MD [address] [place], [place] [address] [phone] Mon-Thur 8:00-6:00, closed for lunch 12-2, closed Fridays Medicaid - yes; Tricare - no  [facility] Anderson, NP; Badger, MD; Beal, PA; Spencer, PA [address], [place], [place] [address] [phone] Mon-Fri 7:30-4:30 Medicaid - yes, Tricare - yes  [facility]  Agbuya, MD; Klett, NP; Romgoolam, MD; Rothstein, NP [address], [place],  [address] [phone] Mon-Fri 8:30-5:00, closed for lunch 1-2, Sat 8:30-12:00 - sick visits only Providers come to see   babies at [facility] Only accepting newborns of siblings and first time parents ONLY if who have met with office prior to delivery Medicaid -Yes; Tricare - yes  [facility] [place]  Golden, DO; Friddle, NP; Wallace, MD; Wood, MD:  [address], [place], [place] [address] [phone] Mon- Fri 8:00-5:00, Sat 9:00-12:00 - sick  visits only Accepting siblings of established patients and first time mom/baby Medicaid - Yes; Tricare - yes Patients must have vaccinations (baby vaccines)  [place] ([address] & [address])  [facility] [place] [facility] [address] [place], [place] [address] [phone] Mon-Fri 8:00-5:00 Medicaid - no; Tricare - yes  [facility] Briscoe, MD; Schmidt, PA; Moreira, PA [address], [place], [place] [address] [phone] Mon-Fri 8:00-5:00 Medicaid- yes; Tricare - yes  [facility] [place] Boyd, MD; Jones, NP; Osborn, PA [address], , [place] [address] [phone] Mon-Fri 8:00-5:00 Medicaid - Yes; Tricare - yes  [place] ([address])  [facility] Atkinson, PA; Calderon, PA; Cummings, MD; Dillard, MD; Henrish, NP; Isenhour, DO; Martin, PA; Olson, MD; Ott, MD; Phillips, MD; Valente, PA; VanDeven, PA; Yonjof, NP [address], [place], [place] [address] [phone] Mon-Fri 8:30-5:00, Sat 9:00-12:00 - sick only Please register online [email or web address] then schedule online or call office Medicaid-Yes; Tricare -yes  [facility]  Dabrusco, MD; Dial, MD; Elnora, MD; Fleenor, NP; Goolsby, PA; Tonuzi, MD; Turner, NP; West, MD [address], [place], [place] [address] [phone] Mon-Fri 8:00-5:30, Sat&Sun by appointment (phones open at 8:30) Medicaid - Yes; Tricare - yes  [place] ([address]) [facility] Allen, CPNP; Bates, MD; Gordon, MD; Mills, NP; Weinshilboum, DO [address], [place], [place] [address] [phone] M-F 8:00 - 5:15, Sat/Sun 9-12 sick visits only Medicaid - No; Tricare - yes  [facility] [place] [facility]  Brown, PA-C; Cowen, PA-C; Dennis, DO; Fuster, PA-C; Martin, PA-C; Shelton,  PA-C; Spry, MD [address] [place], [place] [address] [phone] Mon-Thur 8:00-7:00, Fri 8:00-5:00 Accepting Medicaid for 13 and under only   [facility] [place] Hayes, FNP; List, FNP; Moran, MD; Pitonzo, PA-C; Scholer, MD; Spangle, FNP; Nzenwa, FNP; Jasper, MD; Moran, MD [address] [place], [place] [address] [phone] Mon-Fri 8:30-5:30 Medicaid - Yes; Tricare - yes  [facility] [address] [facility]  Kelly, CPNP; Logan, MD; Poth, MD; Ramadoss, MD; Staton, NP [address], [place], [place] [address] [phone] Mon-Thur 8:00-5:30, Fri 8:00-5:00, Sat 9:00-12:00 Medicaid - yes, Tricare - yes  [place] ([address])  [facility] [place] Masneri, DO; Meyers, MD; Nelson, PA [address], [place], [place] [address] [phone] Mon-Fri 8:00-5:00, closed for lunch 12-1 Medicaid - No; Tricare - yes  [facility] [place] McGowen, MD [address], [place], [place] [address] [phone] Mon-Fri 8:00-5:00 Medicaid - No; Tricare - yes  [facility] [place] MacDonald, MD; Nayak, MD; Kearns, MD; Jones, MD [address] [place] [address], [place], [place] [address] [phone] Mon-Fri 8:00-5:00 Medicaid- Yes; Tricare - yes  [place] ([address])  [facility] [place] Martin, PA-C; Tabori, MD [address], [place], [place] [address] [phone] Mon-Fri 8:00-5:00 Medicaid - No; Tricare - yes  [facility] [place]  Margin - CPNP [address], [place], [place] [address] [phone] Mon-Weds 8:00-6:00, Thurs-Fri 8:00-5:00, Sat 9:00-12:00 Medicaid - yes; Tricare - yes   [facility] [place] Aubuchon, MD; Brandon, PA [address] [place], [place] [address] [phone] Mon-Fri 8:00-5:00 Medicaid - yes; Tricare - yes  [place]  Pediatric Providers  [facility] [address], [place], [place] [address] [phone] M, Thur: 8am -8pm, Tues, Weds: 8am - 5pm; Fri: 8-1 Medicaid - Yes; Tricare - yes  [facility] Mertz, MD; Johnson, MD; Wells, MD; Downs, PA; Hockenberger, PA [address], [place], [place] [address] [phone] M-F 8:30 - 5:00 Medicaid - Call office; Tricare -yes  [facility] Bonney, MD; Page, MD, Minter, MD; Mueller, PNP; Thomason, NP [address], [place], [place] [address] [phone] M-F 8:30 - 5:00, Sat/Sun 8:30 - 12:30 (sick visits) Medicaid - Call office; Tricare -yes  [facility] Lewis, MD; Shaub, PNP; Boylston, MD; Quaile, PA; Nonato, NP; Landon, CPNP [address], [place], [place] [address] [phone] M-F 8:30 - 5:00 Medicaid - Call office; Tricare - yes  [facility] [place] Cline, MD; Dvergsten, MD; Flores, MD; Kawatu, MD; Nogo, MD [address], [place], [place] [address] [phone] M-Thur: 8:00 - 5:00; Fri: 8:00 - 4:00 Medicaid - yes; Tricare - yes  [facility] [address] [place], [place], [place] [address] [phone] M-F: 8:30- 5:00, closed for lunch 12:30 - 1:00 Medicaid - yes; Tricare -yes  [facility] [place] [address], [place], [place] [address] [phone] M-F 8:00 - 5:00 Medicaid - yes; Tricare - yes  [facility] Johnson, DO; Rumball, DO; Wicker, NP [address] [place], [place], [place] [address] [phone] M-F 8:00 - 5:00, Closed 12-1 for lunch Medicaid - Call; Tricare - yes  [facility] Stein, MD [address], [place], [place] [address] [phone] M-F: 8:00-5:00, Sat: 8:00 - noon Medicaid - call; Tricare -yes  [place] Pediatric Providers  [facility] Collins, FNP-C [address], [place], [place] [address] [phone] M-W: 8:00-5:00, Thur: 8:00 -  7:00, Fri: 8:00 - noon Medicaid - yes; Tricare - yes  [facility] [place] Adams, FNP [address], [place], [place] [address] [phone] M-F 8:00 - 5:00, Closed for lunch 12-1 Medicaid - yes; Tricare - yes  [place] Pediatric Providers  [facility] Smith, FNP, Melvin, MD, Fay, FNP-C [address] [place] [address] [place], [place], [place] [address] [phone] M-T 8:00-5:00, Wed-Fri 7:00-6:00 Medicaid - Yes; Tricare -yes  [facility] [place] Civiletti, DO; [address], [place], [place] [address] [phone] M-F 8:00 - 5:00, closed for lunch 12-1 Medicaid - Yes; Tricare - yes  [facility]  Barnes, MD; Bergdolt, MD; Caulfield, MD; Emrich, MD; Fiscus, MD; Hoppens, MD; Kylstra, MD, McPherson, MD; Todd, MD; Prestwood, MD; Waters, MD; Wood, MD [address], [place], [place] [address] [phone] M-F 8:00-5:00 Medicaid - yes; Tricare - yes  [facility] Cheema, MD (speaks Punjabi and Hindi) [address] [place], [place] [address] [phone] M-F: 8:30 - 5:00, closed 12:30 - 1 for lunch Medicaid - Yes; Tricare -yes  [place] Pediatric Providers  [facility] Loda, MD; [place], MD; Burke,   MD [address], [place], [place] [address] [phone] M-Th: 8:00 - 5:30, Fri: 8:00 - 12:00 Medicaid - yes; Tricare - yes  [facility] [place] Lookabill, NP; Meier, MD; Daffron, MD [address], [place], [place] [address] [phone] M-F: 8:00 - 5:00 Medicaid - yes; Tricare - yes  [facility] Busse, NP; Bowman, NP; Baune, NP; Entwistle, MD; Williams, MD, Huffman, NP, Ferguson, MD; Patel, DO [address], [place], [place] [address] [phone] M-F: 8:30 - 5:30p Medicaid - yes; Tricare - yes Other locations available as well  [place] [facility] Rajan, MD; Wilson, MD; Morgan, PA-C, Domenech, PA-C; Myers, PA-C [address], [place], [place] [address] [phone] M-W: 8:00am - 7:00pm, Thurs: 8:00am - 8:00pm; Fri: 8:00am - 5:00pm, closed daily from 12-1 for lunch Medicaid - yes; Tricare - yes  [place] Pediatric Providers  [facility] [place] Adams, MD; Crystal, FNP; Hadley, MD; Stokes, MD; Johnson, PNP; Brady, PA-C; West, PNP; Gardner, MD;  [address], [place], [place] [address] [phone] M - Fri: 8am - 5pm, Sat 9-noon Medicaid - Yes; Tricare -yes  [facility] [place] Nayak, MD; Jones, FNP; McDonald, MD; Kearns, MD [address] [place] [address], [place], [place] [phone] M-F 8:00 - 5:00 Medicaid - call; Tricare - yes  [facility] [place] Bell, MD; Emory, PNP; Pinder, MD; Anderson, MD; Light, PA-C; Johnson, MD; Latta, MD; Saul, PNP; Rainey, MD; Clifford, MD; McClung, MD [address], [place], [place] [address] [phone] M-F 8:00am - 5:00pm; Sat. 9:00 - 11:00 Medicaid - yes; Tricare - yes  [facility] [place] Soldato-Couture, MD [address], [place], [place] [address] [phone] M-F 8:00 - 5:00 Medicaid - [place] Medicaid only; Tricare - yes  [facility] [place] Walker, MD; Davis, PNP; Ajizian, MD [address] [place] [address], [place], [place] [address] [phone] M-F 8:00 - 5:00 Medicaid - yes; Tricare - yes  [facility] [place] Barry, MD; Brown, MD, [place], MD, Hazek, MD; Hoyle, MD; Smith, MD; [address], [place], [place] [address] [phone] M-F: 8-5 Medicaid - yes; Tricare - yes  [facility] [place] Brady, Md; Dowlen, MD; [address] [place] [address], [place], [place] [address] [phone] M-F 8-5 Medicaid - yes; Tricare - yes  [facility] Kearns, MD; Nayak, MD; Sold[place]-Courture, MD; Pellam-Palmer, DNP;  Herring, PNP [address], [place], [place] [address] [phone] M-F 8-5 Medicaid - yes; Tricare - yes  [facility] Weathers, MD; Merritt, PA-C; Davis-PA-C; Warnimont, MD [address], [place], [place] [address] [phone] M-F 7am - 5 pm Medicaid - call; Tricare - yes  [facility] Hill, PNP; Erickson, MD; Robinson, MD [address], [place], [place] [address] [phone] M-F 8-5 Medicaid - yes; Tricare - yes  [facility] Kribbs, MD; Warner, MD; Williams, FNP; Brooks, FNP; Boles, FNP; Romblad, PA-C; [place] - FNP [address], [place], [place] [address] [phone] M-F 8-5 Medicaid- yes; Tricare - yes  [facility] [place] [place] Yoder, MD; Verenes, MD; Armentrout, MD; Stewart, MD; Beasley, CPNP; Ford, MD; Erickson, MD; Rice, MD [address], [place], [place] [address] [phone] M-F: 8-5, Sat: 9-4, Sun 9-12 Medicaid - yes; Tricare - yes  [facility] Little, PNP; Davis, PNP [address], [place],   [place] [address] [phone] M-F 8 - 5, closed 12-1 for lunch Medicaid - yes; Tricare - yes  [facility] Friesen, MD; Cnegia, MD; Rice, MD; Patel, DO [address], [place], [place] [address] [phone] M-F 8- 5:30 Medicaid - yes; Tricare - yes  [facility] [place] Zvolensky, MD; Ray, MD; Haas, MD [address] [address], [place], [place] [address] [phone] M: 8-7; Tues-Fri: 8-5; Sat: 9-12 Medicaid - yes; Tricare - yes  [facility] [place] Heinrich, MD; Meyer, MD; Clark, MD; Rhyne, MD; Aubuchon, MD [address], [place], [place] [address] [phone] M: 8-7; Tues-Fri: 8-5; Sat: 8:30-12:30 Medicaid - yes;  Tricare - yes  [facility] [place] Bista, MD; Dillard, PA [address], [place], [place] [address] [phone] Mon-Fri: 8-5 Medicaid - yes; Tricare - yes  [facility] [place] Beasley, CPNP; Mahle, CPNP; Rice, MD; Duffy, MD; Culler, MD; [address], [place], [place] [address] [phone] M-F: 8-5, closed 1-2 for lunch Medicaid - yes; Tricare - yes  [facility] Rickman, PA; Mounce, NP; Smith, MD; Jordan, CPNP; Darty, PA; Ball, MD; Wallace, MD [address] [place] [address], [place], [place] [address] [phone] M-Thurs: 8-7; Fri: 8-6; Sat: 9-12; Sun 2-4 Medicaid - yes; Tricare - yes  [facility] Brown, MD; Shin, MD; Goodman, DNP, FNP; Sebesta, DO; [address], [place], [place] [address] [phone] M-F: 8-5 Medicaid - yes; Tricare - yes  [place] Pediatric Providers  [facility] Dough, MD; Welsh, NP [address], [place], [place] [address] [phone] M - Fri: 8am - 5pm, closed for lunch 12-1 Medicaid - Yes; Tricare - yes  [facility] Manandhar, MD; Riley, MD; Sanger, DO; Vinocur, MD;Hall, PA; Walsh, PA; Campbell, NP [address], [place] [address] [phone] M-F 8:00 - 5:00, Sat 8:00 - 11:30 Medicaid - yes; Tricare - yes  [facility] Khan, MD; Redding, MD, Street, MD, Holt, MD, Burgart, MD; Rhyne, NP; Dickinson, PA;  [address] [place] [address], [place], [place] [address] [phone] M-F 8:10am - 5:00pm Medicaid - yes; Tricare - yes  [facility] Connors, MD; Kime, NP [address], [place], [place] [address] [phone] M-F 8:00 - 5:00 Medicaid - [place] Medicaid only; Tricare - yes  [facility] [place] Whyte, MD; Fox, NP [address], [place], [place] [address] [phone] M-F 8:00 - 5:00; Closed for lunch 12 - 1:00 Medicaid - yes; Tricare - yes  [facility] Penner, MD; Wilburn, FNP [address], [place], [place] [address] [phone] Mon 9-5; Tues/Wed 10-5; Thurs 8:30-5; Fri: 8-12:30 Medicaid - yes; Tricare - yes  [place] Pediatric Providers  [facility]  Golding, MD; Jackson, PA-C [address], [place], [place] [address] [phone] phone [phone] fax M-F 7:15 - 4:30 Medicaid - yes; Tricare - yes  [facility] Gosrani, MD; Meccariello, DO [address] [place], [place] [address] [phone] M-Fri: 8:30 - 5:00, closed for lunch everyday noon - 1pm Medicaid - Yes; Tricare - yes  [facility] Burdine, MD; Daniel, MD; Howard, MD; Sasser, MD; Boles, PA; Boyd, PA-C; Carroll, PA; McGee, PA; Skillman,   PA; Wilson, PA [address] [place], [place] [address] [phone] M-Thurs: 7:30am - 7:00pm; Friday 7:30am - 4pm; Sat: 8:00 - 1:00 Medicaid - Yes; Tricare - yes  [facility] [place] Akhbari, MD; Law, MD; Qayumi, MD; Salvador, DO [address], [place], [place] [address] [phone] M-Thur: 8:00 - 5:00, Fri: 8:00 - Noon Medicaid - yes; Tricare - yes No [place] [employer]  [facility] [place] [facility] Dettinger, MD; Gottschalk, DO; Hawks, NP; Martin, NP; Morgan, NP; Milian, NP; Rakes, NP; Stacks, MD; Webster, PA [address], [place], [place] [address] [phone] M-F 8:00 - 5:00 Medicaid - yes; Tricare - yes  [facility] Collins, FNP-C; Bucio, FNP-C [address], [place], [place] [address] [phone] M, W, R 8:00-5:00, Tues: 8:00am - 7:00pm; Fri 8:00 - noon Medicaid - Yes; Tricare - yes  [facility] Khan, MD [address] [place]  [address] [place],  [address] [phone]  M-Thurs 8:30-5:30, Fri: 8:30-12:30pm Medicaid - Yes; Tricare - N  

## 2022-06-23 NOTE — Progress Notes (Signed)
New OB Intake  I connected with Deborah Lawson  on 06/23/22 at 11:15 AM EDT by MyChart Video Visit and verified that I am speaking with the correct person using two identifiers. Nurse is located at [facility] and pt is located at home.  I discussed the limitations, risks, security and privacy concerns of performing an evaluation and management service by telephone and the availability of in person appointments. I also discussed with the patient that there may be a patient responsible charge related to this service. The patient expressed understanding and agreed to proceed.  I explained I am completing New OB Intake today. We discussed EDD of 01/23/2023 that is based on LMP of 04/18/2022. Pt is G5/P3. I reviewed her allergies, medications, Medical/Surgical/OB history, and appropriate screenings. I informed her of [facility] services. [facility] information placed in AVS. Based on history, this is a high risk pregnancy.  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 Patient given information for [facility] Healthy Baby website for more information about [facility].   MyChart/Babyscripts MyChart access verified. I explained pt will have some visits in office and some virtually. Babyscripts instructions given and order placed. Patient verifies receipt of registration text/e-mail. Account successfully created and app downloaded.  Blood Pressure Cuff/Weight Scale Blood pressure cuff ordered for patient to pick-up from [employer]. Explained after first prenatal appt pt will check weekly and document in Babyscripts.  Anatomy  US Explained first scheduled [place] will be around 19 weeks. Anatomy US scheduled for 08/30/2022 at 9:30am. Pt notified to arrive at 9:45am.  Labs Discussed Avelina Laine genetic screening with patient. Would like both Panorama and Horizon drawn at new OB visit. Routine prenatal labs needed.  COVID Vaccine Patient has had COVID vaccine.   Is patient a CenteringPregnancy candidate?  Not a Candidate    Is patient a Mom+Baby Combined Care candidate?  Not a candidate   If accepted, Mom+Baby staff notified  Social Determinants of Health Food Insecurity: Patient denies food insecurity. WIC Referral: Patient is interested in referral to [facility].  Transportation: Patient denies transportation needs. Childcare: Discussed no children allowed at ultrasound appointments. Offered childcare services; patient declines childcare services at this time.  Interested in [place]? If yes, send referral and doula dot phrase.   First visit review I reviewed new OB appt with patient. I explained they will have a provider visit that includes new ob labs and listening to baby heartbeat. Explained pt will be seen by Renee Rival, CMN at first visit; encounter routed to appropriate provider. Explained that patient will be seen by pregnancy navigator following visit with provider.   Lowry Bowl, CMA 06/23/2022  11:15 AM

## 2022-06-24 ENCOUNTER — Other Ambulatory Visit (HOSPITAL_COMMUNITY): Payer: Self-pay | Admitting: Advanced Practice Midwife

## 2022-06-24 ENCOUNTER — Ambulatory Visit: Payer: Medicaid Other

## 2022-06-24 ENCOUNTER — Ambulatory Visit (HOSPITAL_COMMUNITY)
Admission: RE | Admit: 2022-06-24 | Discharge: 2022-06-24 | Disposition: A | Discharge status: Home / Self Care | Payer: Medicaid Other | Source: Ambulatory Visit | Attending: Advanced Practice Midwife | Admitting: Advanced Practice Midwife

## 2022-06-24 DIAGNOSIS — Z3490 Encounter for supervision of normal pregnancy, unspecified, unspecified trimester: Secondary | ICD-10-CM

## 2022-06-24 DIAGNOSIS — Z3A01 Less than 8 weeks gestation of pregnancy: Secondary | ICD-10-CM | POA: Insufficient documentation

## 2022-06-24 DIAGNOSIS — Z6740 Type O blood, Rh positive: Secondary | ICD-10-CM | POA: Diagnosis present

## 2022-06-24 DIAGNOSIS — Z3491 Encounter for supervision of normal pregnancy, unspecified, first trimester: Secondary | ICD-10-CM | POA: Diagnosis not present

## 2022-06-24 DIAGNOSIS — Z3A09 9 weeks gestation of pregnancy: Secondary | ICD-10-CM | POA: Diagnosis not present

## 2022-06-29 ENCOUNTER — Inpatient Hospital Stay (HOSPITAL_COMMUNITY)
Admission: AD | Admit: 2022-06-29 | Discharge: 2022-06-29 | Disposition: A | Discharge status: Home / Self Care | Payer: Medicaid Other | Attending: Obstetrics and Gynecology | Admitting: Obstetrics and Gynecology

## 2022-06-29 ENCOUNTER — Inpatient Hospital Stay (HOSPITAL_COMMUNITY): Payer: Medicaid Other

## 2022-06-29 ENCOUNTER — Encounter (HOSPITAL_COMMUNITY): Payer: Self-pay | Admitting: Obstetrics and Gynecology

## 2022-06-29 DIAGNOSIS — Z3A10 10 weeks gestation of pregnancy: Secondary | ICD-10-CM | POA: Diagnosis not present

## 2022-06-29 DIAGNOSIS — R0602 Shortness of breath: Secondary | ICD-10-CM | POA: Diagnosis present

## 2022-06-29 DIAGNOSIS — G43409 Hemiplegic migraine, not intractable, without status migrainosus: Secondary | ICD-10-CM

## 2022-06-29 DIAGNOSIS — O99351 Diseases of the nervous system complicating pregnancy, first trimester: Secondary | ICD-10-CM | POA: Insufficient documentation

## 2022-06-29 DIAGNOSIS — O210 Mild hyperemesis gravidarum: Secondary | ICD-10-CM | POA: Diagnosis not present

## 2022-06-29 LAB — CBC
HCT: 37.3 % (ref 36.0–46.0)
Hemoglobin: 12.8 g/dL (ref 12.0–15.0)
MCH: 27.2 pg (ref 26.0–34.0)
MCHC: 34.3 g/dL (ref 30.0–36.0)
MCV: 79.2 fL — ABNORMAL LOW (ref 80.0–100.0)
Platelets: 390 10*3/uL (ref 150–400)
RBC: 4.71 MIL/uL (ref 3.87–5.11)
RDW: 14.6 % (ref 11.5–15.5)
WBC: 6.7 10*3/uL (ref 4.0–10.5)
nRBC: 0 % (ref 0.0–0.2)

## 2022-06-29 LAB — URINALYSIS, ROUTINE W REFLEX MICROSCOPIC
Bilirubin Urine: NEGATIVE
Glucose, UA: NEGATIVE mg/dL
Hgb urine dipstick: NEGATIVE
Ketones, ur: NEGATIVE mg/dL
Nitrite: NEGATIVE
Protein, ur: NEGATIVE mg/dL
Specific Gravity, Urine: 1.01 (ref 1.005–1.030)
pH: 7 (ref 5.0–8.0)

## 2022-06-29 LAB — COMPREHENSIVE METABOLIC PANEL
ALT: 38 U/L (ref 0–44)
AST: 26 U/L (ref 15–41)
Albumin: 3.3 g/dL — ABNORMAL LOW (ref 3.5–5.0)
Alkaline Phosphatase: 62 U/L (ref 38–126)
Anion gap: 10 (ref 5–15)
BUN: 7 mg/dL (ref 6–20)
CO2: 24 mmol/L (ref 22–32)
Calcium: 9.4 mg/dL (ref 8.9–10.3)
Chloride: 101 mmol/L (ref 98–111)
Creatinine, Ser: 0.45 mg/dL (ref 0.44–1.00)
GFR, Estimated: >60 mL/min (ref >60)
Glucose, Bld: 98 mg/dL (ref 70–99)
Potassium: 3.4 mmol/L — ABNORMAL LOW (ref 3.5–5.1)
Sodium: 135 mmol/L (ref 135–145)
Total Bilirubin: 0.4 mg/dL (ref 0.3–1.2)
Total Protein: 7.1 g/dL (ref 6.5–8.1)

## 2022-06-29 LAB — D-DIMER, QUANTITATIVE: D-Dimer, Quant: 0.6 ug/mL-FEU — ABNORMAL HIGH (ref 0.00–0.50)

## 2022-06-29 LAB — TROPONIN I (HIGH SENSITIVITY): Troponin I (High Sensitivity): 3 ng/L (ref <18)

## 2022-06-29 MED ORDER — METOCLOPRAMIDE HCL 10 MG PO TABS
10.0000 mg | ORAL_TABLET | Freq: Four times a day (QID) | ORAL | Status: DC | PRN
  Administered 2022-06-29: 10 mg via ORAL
  Filled 2022-06-29: qty 1

## 2022-06-29 MED ORDER — ACETAMINOPHEN 500 MG PO TABS
1000.0000 mg | ORAL_TABLET | Freq: Four times a day (QID) | ORAL | Status: DC | PRN
  Administered 2022-06-29: 1000 mg via ORAL
  Filled 2022-06-29: qty 2

## 2022-06-29 MED ORDER — METOCLOPRAMIDE HCL 10 MG PO TABS: 10.0000 mg | ORAL_TABLET | Freq: Four times a day (QID) | ORAL | 0 refills | Status: DC | PRN

## 2022-06-29 MED ORDER — CYCLOBENZAPRINE HCL 5 MG PO TABS
10.0000 mg | ORAL_TABLET | Freq: Three times a day (TID) | ORAL | Status: DC | PRN
  Administered 2022-06-29: 10 mg via ORAL
  Filled 2022-06-29: qty 2

## 2022-06-29 MED ORDER — SALINE SPRAY 0.65 % NA SOLN
1.0000 | NASAL | Status: DC | PRN
  Administered 2022-06-29: 1 via NASAL
  Filled 2022-06-29: qty 44

## 2022-06-29 NOTE — MAU Note (Signed)
Patient transport at bedside to take patient to radiology.  °

## 2022-06-29 NOTE — MAU Provider Note (Cosign Needed Addendum)
History     CSN: [id number]  Arrival date and time: 06/29/22 8295   Event Date/Time   First Provider Initiated Contact with Patient 06/29/22 [phone]      Chief Complaint  Patient presents with   Headache   Shortness of Breath   body pain   32 y.o. [id number] @10 .2 wks presenting with HA, left sided body pain, SOB, and nasal congestion. HA and body pain has been intermittent for the last year. She has seen a neurologist and used gabapentin in the past but hasn't since she became pregnant. HA is located left frontal, temporal, and down her neck. Rates pain 7/10. Has not used anything. States she cannot get a deep breath sometimes when she is lying down or sleeping. Denies CP. Reports right nasal congestion. Denies current cough or sore throat. Also reports daily nausea and poor appetite.    OB History     Gravida  5   Para  3   Term  3   Preterm  0   AB  1   Living  3      SAB  1   IAB  0   Ectopic  0   Multiple  0   Live Births  3           Past Medical History:  Diagnosis Date   Gestational diabetes    Gestational hypertension     Past Surgical History:  Procedure Laterality Date   CESAREAN SECTION N/A 10/26/2019   Procedure: CESAREAN SECTION;  Surgeon: Reva Bores, MD;  Location: [facility] LD ORS;  Service: Obstetrics;  Laterality: N/A;   MANDIBLE SURGERY      Family History  Problem Relation Age of Onset   Hypertension Father     Social History   Tobacco Use   Smoking status: Never    Passive exposure: Never   Smokeless tobacco: Never  Vaping Use   Vaping Use: Never used  Substance Use Topics   Alcohol use: Never   Drug use: Never    Allergies: No Known Allergies  No medications prior to admission.    Review of Systems  Constitutional:  Positive for appetite change. Negative for chills and fever.  HENT:  Positive for congestion. Negative for sore throat.   Respiratory:  Positive for shortness of breath. Negative for cough.    Cardiovascular:  Negative for chest pain.  Gastrointestinal:  Positive for abdominal pain and nausea. Negative for vomiting.  Genitourinary:  Negative for dysuria and vaginal bleeding.  Musculoskeletal:  Positive for myalgias.  Neurological:  Positive for headaches.   Physical Exam   Blood pressure 114/70, pulse 84, temperature 98.6 F (37 C), temperature source Oral, resp. rate 15, last menstrual period 04/18/2022, SpO2 100 %.  Physical Exam Vitals and nursing note reviewed.  Constitutional:      General: She is not in acute distress.    Appearance: Normal appearance.  HENT:     Head: Normocephalic and atraumatic.  Eyes:     Extraocular Movements: Extraocular movements intact.     Pupils: Pupils are equal, round, and reactive to light.  Cardiovascular:     Rate and Rhythm: Normal rate and regular rhythm.     Heart sounds: Normal heart sounds.  Pulmonary:     Effort: Pulmonary effort is normal. No respiratory distress.     Breath sounds: Normal breath sounds. No stridor. No wheezing, rhonchi or rales.  Musculoskeletal:        General: Normal range of  motion.     Cervical back: Normal range of motion.  Skin:    General: Skin is warm and dry.  Neurological:     General: No focal deficit present.     Mental Status: She is alert and oriented to person, place, and time.     Cranial Nerves: No cranial nerve deficit.  Psychiatric:        Mood and Affect: Mood normal.        Behavior: Behavior normal.   FHT 175  Results for orders placed or performed during the hospital encounter of 06/29/22 (from the past 24 hour(s))  Urinalysis, Routine w reflex microscopic -Urine, Clean Catch     Status: Abnormal   Collection Time: 06/29/22 10:05 AM  Result Value Ref Range   Color, Urine YELLOW YELLOW   APPearance CLOUDY (A) CLEAR   Specific Gravity, Urine 1.010 1.005 - 1.030   pH 7.0 5.0 - 8.0   Glucose, UA NEGATIVE NEGATIVE mg/dL   Hgb urine dipstick NEGATIVE NEGATIVE   Bilirubin  Urine NEGATIVE NEGATIVE   Ketones, ur NEGATIVE NEGATIVE mg/dL   Protein, ur NEGATIVE NEGATIVE mg/dL   Nitrite NEGATIVE NEGATIVE   Leukocytes,Ua LARGE (A) NEGATIVE   RBC / HPF 0-5 0 - 5 RBC/hpf   WBC, UA 11-20 0 - 5 WBC/hpf   Bacteria, UA RARE (A) NONE SEEN   Squamous Epithelial / HPF 21-50 0 - 5 /HPF   Mucus PRESENT   CBC     Status: Abnormal   Collection Time: 06/29/22 10:32 AM  Result Value Ref Range   WBC 6.7 4.0 - 10.5 K/uL   RBC 4.71 3.87 - 5.11 MIL/uL   Hemoglobin 12.8 12.0 - 15.0 g/dL   HCT 16.1 09.6 - 04.5 %   MCV 79.2 (L) 80.0 - 100.0 fL   MCH 27.2 26.0 - 34.0 pg   MCHC 34.3 30.0 - 36.0 g/dL   RDW 40.9 81.1 - 91.4 %   Platelets 390 150 - 400 K/uL   nRBC 0.0 0.0 - 0.2 %  Comprehensive metabolic panel     Status: Abnormal   Collection Time: 06/29/22 10:32 AM  Result Value Ref Range   Sodium 135 135 - 145 mmol/L   Potassium 3.4 (L) 3.5 - 5.1 mmol/L   Chloride 101 98 - 111 mmol/L   CO2 24 22 - 32 mmol/L   Glucose, Bld 98 70 - 99 mg/dL   BUN 7 6 - 20 mg/dL   Creatinine, Ser 7.82 0.44 - 1.00 mg/dL   Calcium 9.4 8.9 - 95.6 mg/dL   Total Protein 7.1 6.5 - 8.1 g/dL   Albumin 3.3 (L) 3.5 - 5.0 g/dL   AST 26 15 - 41 U/L   ALT 38 0 - 44 U/L   Alkaline Phosphatase 62 38 - 126 U/L   Total Bilirubin 0.4 0.3 - 1.2 mg/dL   GFR, Estimated >21 >30 mL/min   Anion gap 10 5 - 15  Troponin I (High Sensitivity)     Status: None   Collection Time: 06/29/22 10:32 AM  Result Value Ref Range   Troponin I (High Sensitivity) 3 <18 ng/L  D-dimer, quantitative     Status: Abnormal   Collection Time: 06/29/22 10:32 AM  Result Value Ref Range   D-Dimer, Quant 0.60 (H) 0.00 - 0.50 ug/mL-FEU   MAU Course  Procedures Nasal spray Reglan Tylenol Benadryl  MDM Normal neuro exam. Labs and imaging ordered and reviewed. EKG reviewed by Dr. Golda Acre.  Pain improved after  meds. No signs of acute process. Stable for discharge home.   Assessment and Plan   1. 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 Morning sickness    Discharge home Follow up at [facility] as scheduled Rx Reglan Return precautions  Allergies as of 06/29/2022   No Known Allergies      Medication List     STOP taking these medications    chlorhexidine 0.12 % solution Commonly known as: PERIDEX   dicyclomine 20 MG tablet Commonly known as: BENTYL   ferrous sulfate 324 MG Tbec   gabapentin 100 MG capsule Commonly known as: NEURONTIN   ketoconazole 2 % shampoo Commonly known as: NIZORAL   meclizine 25 MG tablet Commonly known as: ANTIVERT   meloxicam 7.5 MG tablet Commonly known as: MOBIC   pyrithione zinc 1 % shampoo Commonly known as: HEAD AND SHOULDERS   Sodium Fluoride 5000 PPM 1.1 % Pste Generic drug: Sodium Fluoride       TAKE these medications    Blood Pressure Kit Devi 1 each by Does not apply route as needed.   cholecalciferol 25 MCG (1000 UNIT) tablet Commonly known as: VITAMIN D3 Take 1,000 Units by mouth daily.   cyclobenzaprine 5 MG tablet Commonly known as: FLEXERIL Take 1 tablet (5 mg total) by mouth at bedtime as needed for muscle spasms.   hydrOXYzine 10 MG tablet Commonly known as: ATARAX Take 1 tablet (10 mg total) by mouth at bedtime as needed for itching.   metoCLOPramide 10 MG tablet Commonly known as: REGLAN Take 1 tablet (10 mg total) by mouth every 6 (six) hours as needed for nausea.   ondansetron 4 MG disintegrating tablet Commonly known as: ZOFRAN-ODT Take 1 tablet (4 mg total) by mouth every 8 (eight) hours as needed for nausea or vomiting.   Prenatal Plus Vitamin/Mineral 27-1 MG Tabs Take 1 tablet by mouth daily.   promethazine 25 MG tablet Commonly known as: PHENERGAN Take 1 tablet (25 mg total) by mouth every 6 (six) hours as needed for nausea or vomiting.        Video interpreter present for encounter Donette Larry, CNM 06/29/2022, 2:05 PM

## 2022-06-29 NOTE — MAU Note (Signed)
...  Deborah Lawson is a 32 y.o. at [redacted]w[redacted]d here in MAU reporting: HA, SOB, and entire left sided pain. She reports she has a history of migraines and reports when they occur, she experiences the pain on her entire left side from her head to her toes. She reports this has been ongoing for one year now. She reports she has been experiencing occasional shortness of breath over the past four months. She reports she experiences the difficulty breathing with lying flat when she is trying to sleep. She reports she used to take Gabapentin prior to pregnancy and it would relieve her HA and body pains.  She reports she has informed her primary doctor and they have reported "it is okay." She also reports she has seen a Neurologist and "they have done tests and said everything is okay." She reports the Neurologist stated that "it is due to the nerves in my neck and when I experience cold weather or a cold house it causes this." She reports when she turns the heat on in her home it helps her body pain.  Onset of complaint: Ongoing Pain score: 9/10 left side - including HA  FHT: did not doppler - patient wearing floor length hijab Lab orders placed from triage: UA

## 2022-06-30 LAB — CULTURE, OB URINE: Culture: NO GROWTH

## 2022-07-07 ENCOUNTER — Other Ambulatory Visit (HOSPITAL_COMMUNITY)
Admission: RE | Admit: 2022-07-07 | Discharge: 2022-07-07 | Disposition: A | Discharge status: Home / Self Care | Payer: Medicaid Other | Source: Ambulatory Visit | Attending: Family Medicine | Admitting: Family Medicine

## 2022-07-07 ENCOUNTER — Ambulatory Visit (INDEPENDENT_AMBULATORY_CARE_PROVIDER_SITE_OTHER): Payer: Medicaid Other | Admitting: Advanced Practice Midwife

## 2022-07-07 VITALS — BP 122/78 | HR 104 | Wt 194.4 lb

## 2022-07-07 DIAGNOSIS — O0991 Supervision of high risk pregnancy, unspecified, first trimester: Secondary | ICD-10-CM

## 2022-07-07 DIAGNOSIS — Z8632 Personal history of gestational diabetes: Secondary | ICD-10-CM

## 2022-07-07 DIAGNOSIS — O09291 Supervision of pregnancy with other poor reproductive or obstetric history, first trimester: Secondary | ICD-10-CM

## 2022-07-07 DIAGNOSIS — O0990 Supervision of high risk pregnancy, unspecified, unspecified trimester: Secondary | ICD-10-CM | POA: Insufficient documentation

## 2022-07-07 DIAGNOSIS — Z3A11 11 weeks gestation of pregnancy: Secondary | ICD-10-CM | POA: Diagnosis not present

## 2022-07-07 DIAGNOSIS — O219 Vomiting of pregnancy, unspecified: Secondary | ICD-10-CM

## 2022-07-07 DIAGNOSIS — O34219 Maternal care for unspecified type scar from previous cesarean delivery: Secondary | ICD-10-CM

## 2022-07-07 DIAGNOSIS — Z8759 Personal history of other complications of pregnancy, childbirth and the puerperium: Secondary | ICD-10-CM

## 2022-07-07 MED ORDER — METOCLOPRAMIDE HCL 10 MG PO TABS
10.0000 mg | ORAL_TABLET | Freq: Four times a day (QID) | ORAL | 6 refills | Status: DC | PRN
Start: 2022-07-07 — End: 2022-11-08

## 2022-07-07 MED ORDER — PRENATAL PLUS VITAMIN/MINERAL 27-1 MG PO TABS
1.0000 | ORAL_TABLET | Freq: Every day | ORAL | 11 refills | Status: DC
Start: 2022-07-07 — End: 2023-04-01

## 2022-07-07 NOTE — Progress Notes (Signed)
INITIAL PRENATAL VISIT  Subjective:   Deborah Lawson is 32 y.o. [phone] female being seen today for her first obstetrical visit. She has not received other prenatal care previously this pregnancy. This is not a planned pregnancy. This is a desired pregnancy.  She is at [redacted]w[redacted]d gestation by US/LMP Her obstetrical history is significant for pregnancy induced hypertension. Relationship with FOB: spouse, living together. Patient does intend to breast feed. Pregnancy history fully reviewed.  Review of Systems:   ROS no complaints.  Objective:    Obstetric History OB History  Gravida Para Term Preterm AB Living  5 3 3  0 1 3  SAB IAB Ectopic Multiple Live Births  1 0 0 0 3    # Outcome Date GA Lbr Len/2nd Weight Sex Delivery Anes PTL Lv  5 Current           4 Term 10/26/19 [redacted]w[redacted]d  6 lb 15 oz (3.147 kg) F CS-LTranv Spinal  LIV     Complications: Breech presentation  3 SAB           2 Term      Vag-Spont     1 Term      Vag-Spont       Past Medical History:  Diagnosis Date   Gestational diabetes    Gestational hypertension     Past Surgical History:  Procedure Laterality Date   CESAREAN SECTION N/A 10/26/2019   Procedure: CESAREAN SECTION;  Surgeon: Reva Bores, MD;  Location: [facility] LD ORS;  Service: Obstetrics;  Laterality: N/A;   MANDIBLE SURGERY      Current Outpatient Medications on File Prior to Visit  Medication Sig Dispense Refill   Blood Pressure Monitoring (BLOOD PRESSURE KIT) DEVI 1 each by Does not apply route as needed. 1 each 0   CARESTART COVID-19 HOME TEST KIT See admin instructions.     cholecalciferol (VITAMIN D3) 25 MCG (1000 UNIT) tablet Take 1,000 Units by mouth daily.     metoCLOPramide (REGLAN) 10 MG tablet Take 1 tablet (10 mg total) by mouth every 6 (six) hours as needed for nausea. 30 tablet 0   ondansetron (ZOFRAN-ODT) 4 MG disintegrating tablet Take 1 tablet (4 mg total) by mouth every 8 (eight) hours as needed for nausea or vomiting. 20 tablet 0    Prenatal Vit-Fe Fumarate-FA (PRENATAL PLUS VITAMIN/MINERAL) 27-1 MG TABS Take 1 tablet by mouth daily. 30 tablet 11   cyclobenzaprine (FLEXERIL) 5 MG tablet Take 1 tablet (5 mg total) by mouth at bedtime as needed for muscle spasms. (Patient not taking: Reported on 06/09/2022) 30 tablet 0   hydrOXYzine (ATARAX) 10 MG tablet Take 1 tablet (10 mg total) by mouth at bedtime as needed for itching. (Patient not taking: Reported on 06/23/2022) 20 tablet 0   promethazine (PHENERGAN) 25 MG tablet Take 1 tablet (25 mg total) by mouth every 6 (six) hours as needed for nausea or vomiting. (Patient not taking: Reported on 06/23/2022) 30 tablet 0   [DISCONTINUED] NIFEdipine (PROCARDIA XL) 30 MG 24 hr tablet Take 1 tablet (30 mg total) by mouth daily. 30 tablet 1   No current facility-administered medications on file prior to visit.    No Known Allergies  Social History:  reports that she has never smoked. She has never been exposed to tobacco smoke. She has never used smokeless tobacco. She reports that she does not drink alcohol and does not use drugs.  Family History  Problem Relation Age of Onset   Hypertension  Father     The following portions of the patient's history were reviewed and updated as appropriate: allergies, current medications, past family history, past medical history, past social history, past surgical history and problem list.  Physical Exam:  BP 122/78   Pulse (!) 104   Wt 194 lb 6.4 oz (88.2 kg)   LMP 04/18/2022 (Exact Date)   BMI 32.35 kg/m  CONSTITUTIONAL: Well-developed, well-nourished female in no acute distress.  HENT:  Normocephalic, atraumatic. Oropharynx is clear and moist EYES: Conjunctivae normal. No scleral icterus.  SKIN: Skin is warm and dry. No rash noted. Not diaphoretic. No erythema. No pallor. MUSCULOSKELETAL: Normal range of motion. No tenderness.  No cyanosis, clubbing, or edema.   NEUROLOGIC: Alert and oriented to person, place, and time. Normal muscle  tone coordination.  PSYCHIATRIC: Normal mood and affect. Normal behavior. Normal judgment and thought content. CARDIOVASCULAR: Normal heart rate noted. RESPIRATORY: Effort and rate normal. BREASTS: Declined ABDOMEN: Soft, no distention, tenderness, rebound or guarding. Fundal ht: @SP  PELVIC: Normal appearing external genitalia; normal appearing vaginal mucosa and cervix.  No abnormal discharge noted.  Pap smear not obtained.  Uterus S=D, no other palpable masses, no uterine or adnexal tenderness. Fetal Status: Fetal Heart Rate (bpm): 166   Movement: Absent     Indications for ASA therapy (per uptodate) None  Indications for early GDM screening  Gestational diabetes mellitus in a previous pregnancy Yes  Early screening tests: FBS, A1C, Random CBG, glucose challenge   Assessment:   Pregnancy: [id number] 1. Supervision of high risk pregnancy, antepartum  - HORIZON CUSTOM - Culture, OB Urine - CBC/D/Plt+RPR+Rh+ABO+RubIgG... - Hemoglobin A1c - PANORAMA PRENATAL TEST FULL PANEL - GC/Chlamydia probe amp ([place])not at [facility]  2. [redacted] weeks gestation of pregnancy   3. History of cesarean delivery, currently pregnant - Undecided about TOLAC vs RCS  4. History of gestational hypertension  - Protein / creatinine ratio, urine - Comp Met (CMET)  5. History of gestational diabetes in prior pregnancy, currently pregnant in first trimester - A1C  6. Language Barrier - Interpreter used   Plan:  Initial labs drawn/reviewed. Prenatal vitamins. Problem list reviewed and updated. Genetic screening discussed: NIPS/First trimester screen/Quad/AFP ordered. Role of ultrasound in pregnancy discussed; Anatomy US: ordered. Amniocentesis discussed: not indicated. Follow up in 4 weeks. (Traditional) Discussed clinic routines, schedule of care and testing, genetic screening options, involvement of students and residents under the direct supervision of APPs and doctors and presence of female  providers. Pt verbalized understanding.  [place], CNM 07/07/2022 11:02 AM

## 2022-07-08 ENCOUNTER — Encounter: Payer: Self-pay | Admitting: Advanced Practice Midwife

## 2022-07-08 LAB — CBC/D/PLT+RPR+RH+ABO+RUBIGG...
Antibody Screen: NEGATIVE
Basophils Absolute: 0 10*3/uL (ref 0.0–0.2)
Basos: 1 %
EOS (ABSOLUTE): 0.1 10*3/uL (ref 0.0–0.4)
Eos: 1 %
HCV Ab: NONREACTIVE
HIV Screen 4th Generation wRfx: NONREACTIVE
Hematocrit: 39.4 % (ref 34.0–46.6)
Hemoglobin: 13 g/dL (ref 11.1–15.9)
Hepatitis B Surface Ag: NEGATIVE
Immature Grans (Abs): 0 10*3/uL (ref 0.0–0.1)
Immature Granulocytes: 0 %
Lymphocytes Absolute: 3.1 10*3/uL (ref 0.7–3.1)
Lymphs: 45 %
MCH: 26.7 pg (ref 26.6–33.0)
MCHC: 33 g/dL (ref 31.5–35.7)
MCV: 81 fL (ref 79–97)
Monocytes Absolute: 0.5 10*3/uL (ref 0.1–0.9)
Monocytes: 8 %
Neutrophils Absolute: 3.1 10*3/uL (ref 1.4–7.0)
Neutrophils: 45 %
Platelets: 389 10*3/uL (ref 150–450)
RBC: 4.86 x10E6/uL (ref 3.77–5.28)
RDW: 14.4 % (ref 11.7–15.4)
RPR Ser Ql: NONREACTIVE
Rh Factor: POSITIVE
Rubella Antibodies, IGG: 24.5 index (ref >0.99)
WBC: 6.8 10*3/uL (ref 3.4–10.8)

## 2022-07-08 LAB — PROTEIN / CREATININE RATIO, URINE
Creatinine, Urine: 32.5 mg/dL
Protein, Ur: 7.1 mg/dL
Protein/Creat Ratio: 218 mg/g creat — ABNORMAL HIGH (ref 0–200)

## 2022-07-08 LAB — COMPREHENSIVE METABOLIC PANEL
ALT: 40 IU/L — ABNORMAL HIGH (ref 0–32)
AST: 28 IU/L (ref 0–40)
Albumin/Globulin Ratio: 1.3 (ref 1.2–2.2)
Albumin: 4.4 g/dL (ref 4.0–5.0)
Alkaline Phosphatase: 88 IU/L (ref 44–121)
BUN/Creatinine Ratio: 17 (ref 9–23)
BUN: 6 mg/dL (ref 6–20)
Bilirubin Total: 0.2 mg/dL (ref 0.0–1.2)
CO2: 15 mmol/L — ABNORMAL LOW (ref 20–29)
Calcium: 9.7 mg/dL (ref 8.7–10.2)
Chloride: 105 mmol/L (ref 96–106)
Creatinine, Ser: 0.36 mg/dL — ABNORMAL LOW (ref 0.57–1.00)
Globulin, Total: 3.3 g/dL (ref 1.5–4.5)
Glucose: 86 mg/dL (ref 70–99)
Potassium: 4.2 mmol/L (ref 3.5–5.2)
Sodium: 141 mmol/L (ref 134–144)
Total Protein: 7.7 g/dL (ref 6.0–8.5)
eGFR: 140 mL/min/{1.73_m2} (ref >59)

## 2022-07-08 LAB — HEMOGLOBIN A1C
Est. average glucose Bld gHb Est-mCnc: 111 mg/dL
Hgb A1c MFr Bld: 5.5 % (ref 4.8–5.6)

## 2022-07-08 LAB — GC/CHLAMYDIA PROBE AMP (~~LOC~~) NOT AT ARMC
Chlamydia: NEGATIVE
Comment: NEGATIVE
Comment: NORMAL
Neisseria Gonorrhea: NEGATIVE

## 2022-07-08 LAB — HCV INTERPRETATION

## 2022-07-09 ENCOUNTER — Telehealth: Payer: Self-pay | Admitting: General Practice

## 2022-07-09 LAB — URINE CULTURE, OB REFLEX

## 2022-07-09 LAB — CULTURE, OB URINE

## 2022-07-09 NOTE — Telephone Encounter (Signed)
Called patient per her mychart message request with interpreter #SUMA. Patient reports facial congestion that is causing pressure in her face, eyes and head. Patient is requesting a prescription for this. She states she thinks she is having some kind of allergy. Told patient there isn't a prescription to send in for that but I can recommend some options she can buy at the pharmacy. Patient verbalized understanding and request it be sent in [place] message. Told patient I would do that and reviewed instructions. Patient verbalized understanding and had no other questions.

## 2022-07-10 ENCOUNTER — Encounter: Payer: Self-pay | Admitting: Advanced Practice Midwife

## 2022-07-15 LAB — PANORAMA PRENATAL TEST FULL PANEL:PANORAMA TEST PLUS 5 ADDITIONAL MICRODELETIONS: FETAL FRACTION: 5.4

## 2022-07-22 ENCOUNTER — Telehealth: Payer: Self-pay | Admitting: Lactation Services

## 2022-07-22 NOTE — Telephone Encounter (Signed)
Returned call to patient with assistance of [employer], [phone].   Patient with concerns for pain on her left side, she reports the pain is on the right side of the nose, eyes and head. She reports no nasal drainage. Patient reports she took pain medicaiton yesterday and it did not help. She reports she would like an urgent referral to a doctor.   Advised that patient call her PCP. She reports that MD would not prescribe medications without talking with OB. Patient reports medication was for allergy medications. She would like an referral for Otolaryngologist, reviewed she needs to go through her PCP for the referral.   Reviewed I can send in allergy medication but may not be covered by insurance as is OTC. Patient reports she takes Zytrec daily and it is not helping.   Patient has seen [facility] Otolaryngology in 2022, asked if she has called the office to see if she can make an appointment. She reports they do not take Medicaid any more. She reports her PCP referred her 9 months ago, but did not take her insurance.   She reports there is a clinic on [address] that she has seen previously but she called and they did not call her back. Reviewed to call back as it will be shorter than awaiting a referral.   Patient asked if ok to take ATB while pregnant, reviewed yes you can take ATB while pregnant if needed for sinus infection.   Reviewed if not able to see Otolaryngology to go to an Urgent Care of PCP for follow up. Patient voiced understanding.

## 2022-08-01 ENCOUNTER — Inpatient Hospital Stay (HOSPITAL_COMMUNITY)
Admission: AD | Admit: 2022-08-01 | Discharge: 2022-08-01 | Disposition: A | Discharge status: Home / Self Care | Payer: Medicaid Other | Attending: Obstetrics and Gynecology | Admitting: Obstetrics and Gynecology

## 2022-08-01 ENCOUNTER — Other Ambulatory Visit: Payer: Self-pay

## 2022-08-01 ENCOUNTER — Encounter (HOSPITAL_COMMUNITY): Payer: Self-pay | Admitting: Obstetrics and Gynecology

## 2022-08-01 DIAGNOSIS — O26892 Other specified pregnancy related conditions, second trimester: Secondary | ICD-10-CM | POA: Diagnosis present

## 2022-08-01 DIAGNOSIS — R1030 Lower abdominal pain, unspecified: Secondary | ICD-10-CM | POA: Diagnosis not present

## 2022-08-01 DIAGNOSIS — R109 Unspecified abdominal pain: Secondary | ICD-10-CM | POA: Diagnosis not present

## 2022-08-01 DIAGNOSIS — N90810 Female genital mutilation status, unspecified: Secondary | ICD-10-CM | POA: Diagnosis present

## 2022-08-01 DIAGNOSIS — Z3A15 15 weeks gestation of pregnancy: Secondary | ICD-10-CM | POA: Diagnosis not present

## 2022-08-01 DIAGNOSIS — O26899 Other specified pregnancy related conditions, unspecified trimester: Secondary | ICD-10-CM

## 2022-08-01 LAB — URINALYSIS, ROUTINE W REFLEX MICROSCOPIC
Bilirubin Urine: NEGATIVE
Glucose, UA: NEGATIVE mg/dL
Hgb urine dipstick: NEGATIVE
Ketones, ur: NEGATIVE mg/dL
Nitrite: NEGATIVE
Protein, ur: NEGATIVE mg/dL
Specific Gravity, Urine: 1.019 (ref 1.005–1.030)
Squamous Epithelial / HPF: >50 /HPF (ref 0–5)
pH: 5 (ref 5.0–8.0)

## 2022-08-01 LAB — GLUCOSE, CAPILLARY: Glucose-Capillary: 81 mg/dL (ref 70–99)

## 2022-08-01 LAB — WET PREP, GENITAL
Clue Cells Wet Prep HPF POC: NONE SEEN
Sperm: NONE SEEN
Trich, Wet Prep: NONE SEEN
WBC, Wet Prep HPF POC: >=10 — AB (ref <10)
Yeast Wet Prep HPF POC: NONE SEEN

## 2022-08-01 MED ORDER — CYCLOBENZAPRINE HCL 10 MG PO TABS: 10.0000 mg | ORAL_TABLET | Freq: Two times a day (BID) | ORAL | 0 refills | Status: DC | PRN

## 2022-08-01 MED ORDER — CYCLOBENZAPRINE HCL 5 MG PO TABS
10.0000 mg | ORAL_TABLET | Freq: Once | ORAL | Status: AC
  Administered 2022-08-01: 10 mg via ORAL
  Filled 2022-08-01: qty 2

## 2022-08-01 NOTE — MAU Note (Signed)
.  Deborah Lawson is a 32 y.o. at [redacted]w[redacted]d here in MAU reporting: lower abd pain that is ongoing since her last visit in MAU.  States "sometimes it gets better, but it is always there." Also reports shivering for the past 3 days.  No fever in triage but pt states she took tylenol at 0300.  Denies bleeding or vag dc.   Onset of complaint: 1 month Pain score: 7 Vitals:   08/01/22 0907 08/01/22 0911  BP:  124/66  Pulse: 100   Resp: 15   Temp: 98.4 F (36.9 C)   SpO2: 100%       Lab orders placed from triage:   ua

## 2022-08-01 NOTE — MAU Provider Note (Signed)
History     CSN: [id number]  Arrival date and time: 08/01/22 0854   Event Date/Time   First Provider Initiated Contact with Patient 08/01/22 1030      Chief Complaint  Patient presents with   Abdominal Pain   Back Pain   [name] is a 32 y.o. year old G28P3013 female at [redacted]w[redacted]d weeks gestation who presents to MAU reporting lower abdominal pain since she was here on 06/29/2022; rated 7/10. She reports that the pain "sometimes gets better, but always there." She also complains of shivering x 3 days. She took Tylenol 1000 mg po at 0300 this morning. She is afebrile at this time. She denies VB or vaginal d/c. She receives [facility] with [facility]; next appt is 08/04/2022. [employer] Video Arabic Interpreter, [place] [phone] used for assessment and pelvic exam.    OB History     Gravida  5   Para  3   Term  3   Preterm  0   AB  1   Living  3      SAB  1   IAB  0   Ectopic  0   Multiple  0   Live Births  3           Past Medical History:  Diagnosis Date   Gestational diabetes    Gestational hypertension     Past Surgical History:  Procedure Laterality Date   CESAREAN SECTION N/A 10/26/2019   Procedure: CESAREAN SECTION;  Surgeon: Reva Bores, MD;  Location: [facility];  Service: Obstetrics;  Laterality: N/A;   MANDIBLE SURGERY      Family History  Problem Relation Age of Onset   Hypertension Father     Social History   Tobacco Use   Smoking status: Never    Passive exposure: Never   Smokeless tobacco: Never  Vaping Use   Vaping Use: Never used  Substance Use Topics   Alcohol use: Never   Drug use: Never    Allergies: No Known Allergies  Medications Prior to Admission  Medication Sig Dispense Refill Last Dose   Blood Pressure Monitoring (BLOOD PRESSURE KIT) DEVI 1 each by Does not apply route as needed. 1 each 0    CARESTART COVID-19 HOME TEST KIT See admin instructions.      cholecalciferol (VITAMIN D3) 25 MCG (1000 UNIT) tablet  Take 1,000 Units by mouth daily.      cyclobenzaprine (FLEXERIL) 5 MG tablet Take 1 tablet (5 mg total) by mouth at bedtime as needed for muscle spasms. (Patient not taking: Reported on 06/09/2022) 30 tablet 0    hydrOXYzine (ATARAX) 10 MG tablet Take 1 tablet (10 mg total) by mouth at bedtime as needed for itching. (Patient not taking: Reported on 06/23/2022) 20 tablet 0    metoCLOPramide (REGLAN) 10 MG tablet Take 1 tablet (10 mg total) by mouth every 6 (six) hours as needed for nausea. 60 tablet 6    ondansetron (ZOFRAN-ODT) 4 MG disintegrating tablet Take 1 tablet (4 mg total) by mouth every 8 (eight) hours as needed for nausea or vomiting. 20 tablet 0    Prenatal Vit-Fe Fumarate-FA (PRENATAL PLUS VITAMIN/MINERAL) 27-1 MG TABS Take 1 tablet by mouth daily. 30 tablet 11    promethazine (PHENERGAN) 25 MG tablet Take 1 tablet (25 mg total) by mouth every 6 (six) hours as needed for nausea or vomiting. (Patient not taking: Reported on 06/23/2022) 30 tablet 0     Review of  Systems  Constitutional:  Positive for chills ("shivering" x 3 days).  HENT: Negative.    Eyes: Negative.   Respiratory: Negative.    Cardiovascular: Negative.   Gastrointestinal: Negative.   Endocrine: Negative.   Genitourinary:  Positive for pelvic pain (lower since last MAU visit).  Musculoskeletal: Negative.   Skin: Negative.   Allergic/Immunologic: Negative.   Neurological: Negative.   Hematological: Negative.   Psychiatric/Behavioral: Negative.     Physical Exam   Blood pressure 124/66, pulse 100, temperature 98.4 F (36.9 C), temperature source Oral, resp. rate 15, last menstrual period 04/18/2022, SpO2 100 %.  Physical Exam Vitals and nursing note reviewed. Exam conducted with a chaperone present.  Constitutional:      Appearance: Normal appearance. She is normal weight.  Cardiovascular:     Rate and Rhythm: Normal rate.  Pulmonary:     Effort: Pulmonary effort is normal.  Abdominal:     General: Abdomen  is flat.     Palpations: Abdomen is soft.  Genitourinary:    Vagina: Vaginal discharge present.     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  Musculoskeletal:        General: Normal range of motion.  Skin:    General: Skin is warm and dry.  Neurological:     Mental Status: She is alert and oriented to person, place, and time.  Psychiatric:        Mood and Affect: Mood normal.        Behavior: Behavior normal.        Thought Content: Thought content normal.        Judgment: Judgment normal.    FHTs by doppler: 160 bpm  MAU Course  Procedures  MDM CCUA Wet Prep GC/CT -- Results pending  Flexeril 10 mg po -- pain resolved; pain 3/10 down from 7/10  Results for orders placed or performed during the hospital encounter of 08/01/22 (from the past 24 hour(s))  Urinalysis, Routine w reflex microscopic -Urine, Clean Catch     Status: Abnormal   Collection Time: 08/01/22  9:18 AM  Result Value Ref Range   Color, Urine YELLOW YELLOW   APPearance CLOUDY (A) CLEAR   Specific Gravity, Urine 1.019 1.005 - 1.030   pH 5.0 5.0 - 8.0   Glucose, UA NEGATIVE NEGATIVE mg/dL   Hgb urine dipstick NEGATIVE NEGATIVE   Bilirubin Urine NEGATIVE NEGATIVE   Ketones, ur NEGATIVE NEGATIVE mg/dL   Protein, ur NEGATIVE NEGATIVE mg/dL   Nitrite NEGATIVE NEGATIVE   Leukocytes,Ua SMALL (A) NEGATIVE   RBC / HPF 0-5 0 - 5 RBC/hpf   WBC, UA 6-10 0 - 5 WBC/hpf   Bacteria, UA RARE (A) NONE SEEN   Squamous Epithelial / HPF >50 0 - 5 /HPF   Mucus PRESENT   Wet prep, genital     Status: Abnormal   Collection Time: 08/01/22 10:41 AM   Specimen: Cervix  Result Value Ref Range   Yeast Wet Prep HPF POC NONE SEEN NONE SEEN   Trich, Wet Prep NONE SEEN NONE SEEN   Clue Cells Wet Prep HPF POC NONE SEEN NONE  SEEN   WBC, Wet Prep HPF POC >=10 (A) <10   Sperm NONE SEEN   Glucose, capillary     Status: None   Collection Time: 08/01/22 11:01  AM  Result Value Ref Range   Glucose-Capillary 81 70 - 99 mg/dL    Assessment and Plan  1. Abdominal pain affecting pregnancy - Information provided on abdominal pain in pregnancy - Rx: Flexeril 10 mg BID prn pain   2. [redacted] weeks gestation of pregnancy   - Discharge patient - Keep scheduled appt with [facility] on 08/04/2022 - Patient verbalized an understanding of the plan of care and agrees.    Raelyn Mora, CNM 08/01/2022, 10:44 AM

## 2022-08-02 LAB — GC/CHLAMYDIA PROBE AMP (~~LOC~~) NOT AT ARMC
Chlamydia: NEGATIVE
Comment: NEGATIVE
Comment: NORMAL
Neisseria Gonorrhea: NEGATIVE

## 2022-08-04 ENCOUNTER — Encounter: Payer: Self-pay | Admitting: Internal Medicine

## 2022-08-04 ENCOUNTER — Ambulatory Visit (INDEPENDENT_AMBULATORY_CARE_PROVIDER_SITE_OTHER): Payer: Medicaid Other | Admitting: Family Medicine

## 2022-08-04 ENCOUNTER — Other Ambulatory Visit: Payer: Self-pay

## 2022-08-04 VITALS — BP 129/85 | HR 109 | Wt 196.1 lb

## 2022-08-04 DIAGNOSIS — M545 Low back pain, unspecified: Secondary | ICD-10-CM

## 2022-08-04 DIAGNOSIS — O0990 Supervision of high risk pregnancy, unspecified, unspecified trimester: Secondary | ICD-10-CM

## 2022-08-04 DIAGNOSIS — Z758 Other problems related to medical facilities and other health care: Secondary | ICD-10-CM

## 2022-08-04 DIAGNOSIS — Z603 Acculturation difficulty: Secondary | ICD-10-CM

## 2022-08-04 DIAGNOSIS — O0992 Supervision of high risk pregnancy, unspecified, second trimester: Secondary | ICD-10-CM

## 2022-08-04 DIAGNOSIS — G8929 Other chronic pain: Secondary | ICD-10-CM

## 2022-08-04 DIAGNOSIS — Z3A15 15 weeks gestation of pregnancy: Secondary | ICD-10-CM

## 2022-08-04 HISTORY — DX: Low back pain, unspecified: M54.50

## 2022-08-04 HISTORY — DX: Other chronic pain: G89.29

## 2022-08-04 MED ORDER — ACETAMINOPHEN 500 MG PO TABS
1000.0000 mg | ORAL_TABLET | Freq: Three times a day (TID) | ORAL | 0 refills | Status: AC
Start: 2022-08-04 — End: 2022-08-09

## 2022-08-04 NOTE — Progress Notes (Signed)
   PRENATAL VISIT NOTE  Subjective:  Deborah Lawson is a 32 y.o. Z6X0960 at [redacted]w[redacted]d being seen today for ongoing prenatal care.  She is currently monitored for the following issues for this high-risk pregnancy and has Language barrier; History of gestational diabetes in prior pregnancy, currently pregnant; History of gestational hypertension; Vitamin D deficiency; White matter abnormality on MRI of brain; Abnormal computed tomography of paranasal sinus; Supervision of high risk pregnancy, antepartum; Referred otalgia, left; Temporomandibular jaw dysfunction; History of cesarean delivery, currently pregnant; Female circumcision; and Chronic left-sided low back pain on their problem list.  Patient reports  R sided back pain .  Pt has pain from Contractions: Not present. Vag. Bleeding: None.  Movement: Present. Denies leaking of fluid.   The following portions of the patient's history were reviewed and updated as appropriate: allergies, current medications, past family history, past medical history, past social history, past surgical history and problem list.   Objective:   Vitals:   08/04/22 1015  BP: 129/85  Pulse: (!) 109  Weight: 196 lb 1.6 oz (89 kg)    Fetal Status: Fetal Heart Rate (bpm): 150   Movement: Present     General:  Alert, oriented and cooperative. Patient is in no acute distress.  Skin: Skin is warm and dry. No rash noted.   Cardiovascular: Normal heart rate noted  Respiratory: Normal respiratory effort, no problems with respiration noted  Abdomen: Soft, gravid, appropriate for gestational age.  Pain/Pressure: Present     Pelvic: Cervical exam deferred        Extremities: Normal range of motion.  Edema: None  Mental Status: Normal mood and affect. Normal behavior. Normal judgment and thought content.   Assessment and Plan:  Pregnancy: G5P3013 at [redacted]w[redacted]d  1. [redacted] weeks gestation of pregnancy - AFP, Serum, Open Spina Bifida  2. Supervision of high risk pregnancy,  antepartum  3. Language barrier  4. Chronic left-sided low back pain, unspecified whether sciatica present - acetaminophen (TYLENOL) 500 MG tablet; Take 2 tablets (1,000 mg total) by mouth 3 (three) times daily for 5 days.  Dispense: 30 tablet; Refill: 0   Back pain sometimes relieved with flexeril. Increased tension on left back muscles from cervix to thorax spine. Suspect scoliosis, but unclear due to lordosis of pregnancy. Sent rehab PT, tylenol, and continued flexeril. AFP collected. Arabic interpreter used.   Preterm labor symptoms and general obstetric precautions including but not limited to vaginal bleeding, contractions, leaking of fluid and fetal movement were reviewed in detail with the patient. Please refer to After Visit Summary for other counseling recommendations.   Return in about 4 weeks (around 09/01/2022) for ROB.  Future Appointments  Date Time Provider Department Center  08/30/2022  9:30 AM [facility] NURSE [facility]  08/30/2022  9:45 AM [facility] US4 [facility]  10/12/2022 10:10 AM Drema Dallas, DO [facility] None  12/14/2022  8:30 AM Shamleffer, Konrad Dolores, MD [facility] None    Myrtie Hawk, DO FMOB Fellow, Faculty practice [facility], Center for [facility] 08/04/22  11:05 AM

## 2022-08-04 NOTE — Telephone Encounter (Signed)
Spoke to patient via interpretor [name].  Patient states that she was told that she did not have gall stones after getting her CT.  [facility] and was told that she does have a small gall stone.  Please advise.

## 2022-08-06 ENCOUNTER — Other Ambulatory Visit: Payer: Self-pay

## 2022-08-06 LAB — AFP, SERUM, OPEN SPINA BIFIDA
AFP MoM: 0.72
AFP Value: 18 ng/mL
Gest. Age on Collection Date: 15 weeks
Maternal Age At EDD: 30.8 yr
OSBR Risk 1 IN: 10000
Test Results:: NEGATIVE
Weight: 196 [lb_av]

## 2022-08-06 MED ORDER — BLOOD PRESSURE KIT DEVI: 1.0000 | 0 refills | Status: DC | PRN

## 2022-08-26 ENCOUNTER — Telehealth: Payer: Self-pay

## 2022-08-30 ENCOUNTER — Ambulatory Visit: Payer: Medicaid Other

## 2022-08-30 ENCOUNTER — Other Ambulatory Visit: Payer: Self-pay | Admitting: *Deleted

## 2022-08-30 ENCOUNTER — Ambulatory Visit (HOSPITAL_BASED_OUTPATIENT_CLINIC_OR_DEPARTMENT_OTHER): Payer: Medicaid Other

## 2022-08-30 ENCOUNTER — Ambulatory Visit: Payer: Medicaid Other | Attending: Obstetrics and Gynecology | Admitting: *Deleted

## 2022-08-30 ENCOUNTER — Encounter: Payer: Self-pay | Admitting: *Deleted

## 2022-08-30 VITALS — BP 127/74 | HR 100

## 2022-08-30 DIAGNOSIS — Z3A19 19 weeks gestation of pregnancy: Secondary | ICD-10-CM | POA: Diagnosis not present

## 2022-08-30 DIAGNOSIS — O0990 Supervision of high risk pregnancy, unspecified, unspecified trimester: Secondary | ICD-10-CM

## 2022-08-30 DIAGNOSIS — O09299 Supervision of pregnancy with other poor reproductive or obstetric history, unspecified trimester: Secondary | ICD-10-CM

## 2022-08-30 DIAGNOSIS — O34219 Maternal care for unspecified type scar from previous cesarean delivery: Secondary | ICD-10-CM | POA: Diagnosis not present

## 2022-08-30 DIAGNOSIS — Z363 Encounter for antenatal screening for malformations: Secondary | ICD-10-CM | POA: Insufficient documentation

## 2022-08-30 DIAGNOSIS — O99212 Obesity complicating pregnancy, second trimester: Secondary | ICD-10-CM | POA: Diagnosis not present

## 2022-08-30 DIAGNOSIS — Z8759 Personal history of other complications of pregnancy, childbirth and the puerperium: Secondary | ICD-10-CM

## 2022-08-30 DIAGNOSIS — O09292 Supervision of pregnancy with other poor reproductive or obstetric history, second trimester: Secondary | ICD-10-CM

## 2022-08-30 DIAGNOSIS — N90810 Female genital mutilation status, unspecified: Secondary | ICD-10-CM

## 2022-09-02 ENCOUNTER — Ambulatory Visit (INDEPENDENT_AMBULATORY_CARE_PROVIDER_SITE_OTHER): Payer: Medicaid Other | Admitting: Obstetrics and Gynecology

## 2022-09-02 VITALS — BP 121/81 | HR 118 | Wt 198.2 lb

## 2022-09-02 DIAGNOSIS — Z603 Acculturation difficulty: Secondary | ICD-10-CM

## 2022-09-02 DIAGNOSIS — Z3A19 19 weeks gestation of pregnancy: Secondary | ICD-10-CM

## 2022-09-02 DIAGNOSIS — O34219 Maternal care for unspecified type scar from previous cesarean delivery: Secondary | ICD-10-CM

## 2022-09-02 DIAGNOSIS — O0992 Supervision of high risk pregnancy, unspecified, second trimester: Secondary | ICD-10-CM

## 2022-09-02 DIAGNOSIS — O0990 Supervision of high risk pregnancy, unspecified, unspecified trimester: Secondary | ICD-10-CM

## 2022-09-02 DIAGNOSIS — Z758 Other problems related to medical facilities and other health care: Secondary | ICD-10-CM

## 2022-09-02 DIAGNOSIS — Z8759 Personal history of other complications of pregnancy, childbirth and the puerperium: Secondary | ICD-10-CM

## 2022-09-02 MED ORDER — LOPERAMIDE HCL 2 MG PO TABS: 2.0000 mg | ORAL_TABLET | ORAL | 0 refills | Status: DC | PRN

## 2022-09-02 MED ORDER — CYCLOBENZAPRINE HCL 10 MG PO TABS: 10.0000 mg | ORAL_TABLET | Freq: Two times a day (BID) | ORAL | 0 refills | Status: DC | PRN

## 2022-09-02 NOTE — Progress Notes (Signed)
   PRENATAL VISIT NOTE  Subjective:  Deborah Lawson is a 32 y.o. Z6X0960 at [redacted]w[redacted]d being seen today for ongoing prenatal care.  She is currently monitored for the following issues for this high-risk pregnancy and has Language barrier; History of gestational diabetes in prior pregnancy, currently pregnant; History of gestational hypertension; Vitamin D deficiency; White matter abnormality on MRI of brain; Supervision of high risk pregnancy, antepartum; Referred otalgia, left; Temporomandibular jaw dysfunction; History of cesarean delivery, currently pregnant; Female circumcision; and Chronic left-sided low back pain on their problem list.  Patient reports irregular cramping/abdominal pain. Comes and goes. Tylenol helps some. Trying to stay hydrated.  Contractions: Irritability. Vag. Bleeding: None.  Movement: Present. Denies leaking of fluid.   The following portions of the patient's history were reviewed and updated as appropriate: allergies, current medications, past family history, past medical history, past social history, past surgical history and problem list.   Objective:   Vitals:   09/02/22 1000  BP: 121/81  Pulse: (!) 118  Weight: 198 lb 3.2 oz (89.9 kg)    Fetal Status: Fetal Heart Rate (bpm): 143   Movement: Present     General:  Alert, oriented and cooperative. Patient is in no acute distress.  Skin: Skin is warm and dry. No rash noted.   Cardiovascular: Normal heart rate noted  Respiratory: Normal respiratory effort, no problems with respiration noted  Abdomen: Soft, gravid, appropriate for gestational age.  Pain/Pressure: Present     Pelvic: Cervical exam deferred        Extremities: Normal range of motion.  Edema: None  Mental Status: Normal mood and affect. Normal behavior. Normal judgment and thought content.   Assessment and Plan:  Pregnancy: A5W0981 at [redacted]w[redacted]d 1. Supervision of high risk pregnancy, antepartum BP and FHR normal  Feeling regular fetal movement   Discussed abdominal pain, supportive meassures and provided strict precautions to follow up at hospital   2. History of gestational hypertension [place] today   3. History of cesarean delivery, currently pregnant 2021 for breech, two prior vaginal  Undecided TOLAC vs RCS 6/17 u/s EFW 66%, AFI nl, follow up 7/24  4. Language barrier In-person interpreter present for visit   5. [redacted] weeks gestation of pregnancy Routine prenatal care   Preterm labor symptoms and general obstetric precautions including but not limited to vaginal bleeding, contractions, leaking of fluid and fetal movement were reviewed in detail with the patient. Please refer to After Visit Summary for other counseling recommendations.  Future Appointments  Date Time Provider Department Center  10/01/2022 10:55 AM Anyanwu, Jethro Bastos, MD [facility]  10/06/2022  8:15 AM [facility] NURSE [facility]  10/06/2022  8:30 AM [facility] US2 [facility]  10/12/2022 10:10 AM Drema Dallas, DO [facility] None  12/14/2022  8:30 AM Shamleffer, Konrad Dolores, MD [facility] None      Albertine Grates, FNP

## 2022-09-06 ENCOUNTER — Telehealth: Payer: Self-pay

## 2022-09-06 ENCOUNTER — Encounter: Payer: Self-pay | Admitting: Neurology

## 2022-09-06 DIAGNOSIS — G501 Atypical facial pain: Secondary | ICD-10-CM

## 2022-09-06 NOTE — Telephone Encounter (Signed)
Per.Jaffe, I actually would like to check MRI of bilateral trigeminal nerves without contrast.   Patient advised with Int on the phone.

## 2022-09-06 NOTE — Telephone Encounter (Signed)
Per patient the facial pain has gotten worse and the headache. The pain in her jaw and runs behind her right ear.    Per last ov notes 06/09/22: As she is describing some new symptoms, now right sided facial pain, consider repeat MRI of brain, but it is too early in her pregnancy to perform now.   I called to Radiology patient may have a MRI w/O contrast while pregnant.

## 2022-09-07 ENCOUNTER — Encounter: Payer: Self-pay | Admitting: Neurology

## 2022-09-10 ENCOUNTER — Ambulatory Visit
Admission: RE | Admit: 2022-09-10 | Discharge: 2022-09-10 | Disposition: A | Discharge status: Home / Self Care | Payer: Medicaid Other | Source: Ambulatory Visit | Attending: Neurology | Admitting: Neurology

## 2022-09-10 DIAGNOSIS — G501 Atypical facial pain: Secondary | ICD-10-CM

## 2022-09-20 ENCOUNTER — Encounter (HOSPITAL_COMMUNITY): Payer: Self-pay | Admitting: Family Medicine

## 2022-09-20 ENCOUNTER — Inpatient Hospital Stay (HOSPITAL_COMMUNITY)
Admission: AD | Admit: 2022-09-20 | Discharge: 2022-09-20 | Disposition: A | Discharge status: Home / Self Care | Payer: Medicaid Other | Attending: Family Medicine | Admitting: Family Medicine

## 2022-09-20 DIAGNOSIS — O0990 Supervision of high risk pregnancy, unspecified, unspecified trimester: Secondary | ICD-10-CM

## 2022-09-20 DIAGNOSIS — R5383 Other fatigue: Secondary | ICD-10-CM | POA: Insufficient documentation

## 2022-09-20 DIAGNOSIS — N90810 Female genital mutilation status, unspecified: Secondary | ICD-10-CM

## 2022-09-20 DIAGNOSIS — N3001 Acute cystitis with hematuria: Secondary | ICD-10-CM | POA: Diagnosis not present

## 2022-09-20 DIAGNOSIS — O2312 Infections of bladder in pregnancy, second trimester: Secondary | ICD-10-CM | POA: Diagnosis present

## 2022-09-20 DIAGNOSIS — O99891 Other specified diseases and conditions complicating pregnancy: Secondary | ICD-10-CM | POA: Diagnosis not present

## 2022-09-20 DIAGNOSIS — O09292 Supervision of pregnancy with other poor reproductive or obstetric history, second trimester: Secondary | ICD-10-CM | POA: Diagnosis present

## 2022-09-20 DIAGNOSIS — Z3A22 22 weeks gestation of pregnancy: Secondary | ICD-10-CM | POA: Diagnosis not present

## 2022-09-20 DIAGNOSIS — O26892 Other specified pregnancy related conditions, second trimester: Secondary | ICD-10-CM

## 2022-09-20 LAB — COMPREHENSIVE METABOLIC PANEL
ALT: 21 U/L (ref 0–44)
AST: 22 U/L (ref 15–41)
Albumin: 2.8 g/dL — ABNORMAL LOW (ref 3.5–5.0)
Alkaline Phosphatase: 68 U/L (ref 38–126)
Anion gap: 14 (ref 5–15)
BUN: 5 mg/dL — ABNORMAL LOW (ref 6–20)
CO2: 23 mmol/L (ref 22–32)
Calcium: 9.1 mg/dL (ref 8.9–10.3)
Chloride: 101 mmol/L (ref 98–111)
Creatinine, Ser: 0.38 mg/dL — ABNORMAL LOW (ref 0.44–1.00)
GFR, Estimated: >60 mL/min (ref >60)
Glucose, Bld: 88 mg/dL (ref 70–99)
Potassium: 4 mmol/L (ref 3.5–5.1)
Sodium: 138 mmol/L (ref 135–145)
Total Bilirubin: 0.3 mg/dL (ref 0.3–1.2)
Total Protein: 6.7 g/dL (ref 6.5–8.1)

## 2022-09-20 LAB — URINALYSIS, ROUTINE W REFLEX MICROSCOPIC
Bilirubin Urine: NEGATIVE
Glucose, UA: NEGATIVE mg/dL
Hgb urine dipstick: NEGATIVE
Ketones, ur: 5 mg/dL — AB
Nitrite: NEGATIVE
Protein, ur: NEGATIVE mg/dL
Specific Gravity, Urine: 1.009 (ref 1.005–1.030)
Squamous Epithelial / HPF: >50 /HPF (ref 0–5)
pH: 7 (ref 5.0–8.0)

## 2022-09-20 LAB — CBC
HCT: 35.8 % — ABNORMAL LOW (ref 36.0–46.0)
Hemoglobin: 11.8 g/dL — ABNORMAL LOW (ref 12.0–15.0)
MCH: 27.5 pg (ref 26.0–34.0)
MCHC: 33 g/dL (ref 30.0–36.0)
MCV: 83.4 fL (ref 80.0–100.0)
Platelets: 328 10*3/uL (ref 150–400)
RBC: 4.29 MIL/uL (ref 3.87–5.11)
RDW: 14.6 % (ref 11.5–15.5)
WBC: 7.4 10*3/uL (ref 4.0–10.5)
nRBC: 0 % (ref 0.0–0.2)

## 2022-09-20 LAB — TSH: TSH: 1.632 u[IU]/mL (ref 0.350–4.500)

## 2022-09-20 MED ORDER — CEFADROXIL 500 MG PO CAPS: 500.0000 mg | ORAL_CAPSULE | Freq: Two times a day (BID) | ORAL | 0 refills | Status: DC

## 2022-09-20 MED ORDER — ACETAMINOPHEN 325 MG PO TABS
650.0000 mg | ORAL_TABLET | Freq: Once | ORAL | Status: AC
  Administered 2022-09-20: 650 mg via ORAL
  Filled 2022-09-20: qty 2

## 2022-09-20 NOTE — MAU Provider Note (Signed)
History     CSN: [id number]  Arrival date and time: 09/20/22 1355   None     Chief Complaint  Patient presents with   Pelvic Pain   HPI This is a 32 year old G5 P3-0-1-3 at 22 weeks and 1 day who presents with 5 days of worsening exhaustion and fatigue.  Her symptoms are to the point where she finds it difficult to stand or walk.  Her symptoms last the whole day.  She does have some mild nausea, but this gets better with nausea medication.  She eats 3 meals a day, but eating small amounts.  OB History     Gravida  5   Para  3   Term  3   Preterm  0   AB  1   Living  3      SAB  1   IAB  0   Ectopic  0   Multiple  0   Live Births  3           Past Medical History:  Diagnosis Date   Abnormal computed tomography of paranasal sinus 07/06/2021   Gestational diabetes    Gestational hypertension     Past Surgical History:  Procedure Laterality Date   CESAREAN SECTION N/A 10/26/2019   Procedure: CESAREAN SECTION;  Surgeon: Reva Bores, MD;  Location: [facility];  Service: Obstetrics;  Laterality: N/A;   MANDIBLE SURGERY      Family History  Problem Relation Age of Onset   Hypertension Father     Social History   Tobacco Use   Smoking status: Never    Passive exposure: Never   Smokeless tobacco: Never  Vaping Use   Vaping Use: Never used  Substance Use Topics   Alcohol use: Never   Drug use: Never    Allergies: No Known Allergies  Medications Prior to Admission  Medication Sig Dispense Refill Last Dose   acetaminophen (TYLENOL) 325 MG tablet Take 650 mg by mouth every 6 (six) hours as needed.   09/20/2022 at 1100   metoCLOPramide (REGLAN) 10 MG tablet Take 1 tablet (10 mg total) by mouth every 6 (six) hours as needed for nausea. 60 tablet 6 Past Week   Prenatal Vit-Fe Fumarate-FA (PRENATAL PLUS VITAMIN/MINERAL) 27-1 MG TABS Take 1 tablet by mouth daily. 30 tablet 11 09/20/2022 at 1100   Blood Pressure Monitoring (BLOOD PRESSURE KIT) DEVI 1  each by Does not apply route as needed. (Patient not taking: Reported on 09/02/2022) 1 each 0    cholecalciferol (VITAMIN D3) 25 MCG (1000 UNIT) tablet Take 1,000 Units by mouth daily. (Patient not taking: Reported on 08/30/2022)      cyclobenzaprine (FLEXERIL) 10 MG tablet Take 1 tablet (10 mg total) by mouth 2 (two) times daily as needed for muscle spasms. 20 tablet 0    loperamide (IMODIUM A-D) 2 MG tablet Take 1 tablet (2 mg total) by mouth as needed for diarrhea or loose stools. 30 tablet 0    ondansetron (ZOFRAN-ODT) 4 MG disintegrating tablet Take 1 tablet (4 mg total) by mouth every 8 (eight) hours as needed for nausea or vomiting. (Patient not taking: Reported on 08/04/2022) 20 tablet 0    promethazine (PHENERGAN) 25 MG tablet Take 1 tablet (25 mg total) by mouth every 6 (six) hours as needed for nausea or vomiting. (Patient not taking: Reported on 06/23/2022) 30 tablet 0     Review of Systems Physical Exam   Blood pressure 114/68, pulse 91, temperature  98.1 F (36.7 C), resp. rate 19, height 5\' 5"  (1.651 m), last menstrual period 04/18/2022, SpO2 100 %.  Physical Exam Vitals reviewed.  Constitutional:      Appearance: Normal appearance.  HENT:     Head: Normocephalic and atraumatic.  Pulmonary:     Effort: Pulmonary effort is normal.  Abdominal:     General: Abdomen is flat.     Palpations: Abdomen is soft.     Tenderness: There is no abdominal tenderness.  Skin:    Capillary Refill: Capillary refill takes less than 2 seconds.  Neurological:     Mental Status: She is alert.  Psychiatric:        Mood and Affect: Mood normal.        Behavior: Behavior normal.        Thought Content: Thought content normal.        Judgment: Judgment normal.    Results for orders placed or performed during the hospital encounter of 09/20/22 (from the past 24 hour(s))  Urinalysis, Routine w reflex microscopic -Urine, Clean Catch     Status: Abnormal   Collection Time: 09/20/22  2:35 PM   Result Value Ref Range   Color, Urine YELLOW YELLOW   APPearance CLOUDY (A) CLEAR   Specific Gravity, Urine 1.009 1.005 - 1.030   pH 7.0 5.0 - 8.0   Glucose, UA NEGATIVE NEGATIVE mg/dL   Hgb urine dipstick NEGATIVE NEGATIVE   Bilirubin Urine NEGATIVE NEGATIVE   Ketones, ur 5 (A) NEGATIVE mg/dL   Protein, ur NEGATIVE NEGATIVE mg/dL   Nitrite NEGATIVE NEGATIVE   Leukocytes,Ua LARGE (A) NEGATIVE   RBC / HPF 0-5 0 - 5 RBC/hpf   WBC, UA 21-50 0 - 5 WBC/hpf   Bacteria, UA MANY (A) NONE SEEN   Squamous Epithelial / HPF >50 0 - 5 /HPF   Mucus PRESENT   CBC     Status: Abnormal   Collection Time: 09/20/22  5:22 PM  Result Value Ref Range   WBC 7.4 4.0 - 10.5 K/uL   RBC 4.29 3.87 - 5.11 MIL/uL   Hemoglobin 11.8 (L) 12.0 - 15.0 g/dL   HCT 16.1 (L) 09.6 - 04.5 %   MCV 83.4 80.0 - 100.0 fL   MCH 27.5 26.0 - 34.0 pg   MCHC 33.0 30.0 - 36.0 g/dL   RDW 40.9 81.1 - 91.4 %   Platelets 328 150 - 400 K/uL   nRBC 0.0 0.0 - 0.2 %  Comprehensive metabolic panel     Status: Abnormal   Collection Time: 09/20/22  5:22 PM  Result Value Ref Range   Sodium 138 135 - 145 mmol/L   Potassium 4.0 3.5 - 5.1 mmol/L   Chloride 101 98 - 111 mmol/L   CO2 23 22 - 32 mmol/L   Glucose, Bld 88 70 - 99 mg/dL   BUN 5 (L) 6 - 20 mg/dL   Creatinine, Ser 7.82 (L) 0.44 - 1.00 mg/dL   Calcium 9.1 8.9 - 95.6 mg/dL   Total Protein 6.7 6.5 - 8.1 g/dL   Albumin 2.8 (L) 3.5 - 5.0 g/dL   AST 22 15 - 41 U/L   ALT 21 0 - 44 U/L   Alkaline Phosphatase 68 38 - 126 U/L   Total Bilirubin 0.3 0.3 - 1.2 mg/dL   GFR, Estimated >21 >30 mL/min   Anion gap 14 5 - 15  TSH     Status: None   Collection Time: 09/20/22  5:22 PM  Result Value  Ref Range   TSH 1.632 0.350 - 4.500 uIU/mL    No results found.   MAU Course  Procedures  MDM Check CBC, CMP, TSH  Assessment and Plan   1. Supervision of high risk pregnancy, antepartum   2. Female circumcision   3. [redacted] weeks gestation of pregnancy   4. Acute cystitis with  hematuria    WIll prescribe antibiotics for UTI. This is likely causing some of her fatigue. Discharge to home.  Deborah Lawson 09/20/2022, 4:41 PM

## 2022-09-20 NOTE — MAU Note (Signed)
.  Deborah Lawson is a 32 y.o. at [redacted]w[redacted]d here in MAU reporting: pelvic pain for the past few days making it hard to walk and she feels very tired.  Denies any vag bleeding or discharge at [facility] time. Repots good fetal movement. LMP:  Onset of complaint: 2-3 days Pain score: 9 Vitals:   09/20/22 1412  BP: 134/70  Pulse: 96  Resp: 18  Temp: 98.1 F (36.7 C)     FHT:146 Lab orders placed from triage:  u/a

## 2022-09-21 ENCOUNTER — Encounter: Payer: Self-pay | Admitting: Advanced Practice Midwife

## 2022-09-21 ENCOUNTER — Telehealth: Payer: Self-pay

## 2022-09-21 LAB — CULTURE, OB URINE: Culture: NO GROWTH

## 2022-09-21 NOTE — Telephone Encounter (Signed)
Per Patient mychart message, Good morning,can you call me back again I found call from you.    Patient advised of her MRI results.

## 2022-09-21 NOTE — Telephone Encounter (Signed)
Called pt in response to MyChart message with [facility] interpreter ID [phone]. Pt states she was told by provider at MAU yesterday that all results are normal, but she is considered about her low HCT, HGB, and albumin. Reviewed with patient these levels are not concerning. Reviewed normal pregnancy increase in plasma volume and mild anemia. Encouraged pt to continue taking prenatal vitamin containing iron to support this process. Pt expresses concern about the safety of Duricef during pregnancy. Reviewed this is safe to take and it is very important to treat any UTI in pregnancy. Reviewed upcoming appts.

## 2022-09-26 DIAGNOSIS — M62838 Other muscle spasm: Secondary | ICD-10-CM

## 2022-10-01 ENCOUNTER — Ambulatory Visit (INDEPENDENT_AMBULATORY_CARE_PROVIDER_SITE_OTHER): Payer: Medicaid Other | Admitting: Obstetrics and Gynecology

## 2022-10-01 ENCOUNTER — Encounter: Payer: Self-pay | Admitting: Obstetrics and Gynecology

## 2022-10-01 VITALS — BP 126/82 | HR 99 | Wt 198.5 lb

## 2022-10-01 DIAGNOSIS — Z758 Other problems related to medical facilities and other health care: Secondary | ICD-10-CM

## 2022-10-01 DIAGNOSIS — Z603 Acculturation difficulty: Secondary | ICD-10-CM

## 2022-10-01 DIAGNOSIS — O34219 Maternal care for unspecified type scar from previous cesarean delivery: Secondary | ICD-10-CM

## 2022-10-01 DIAGNOSIS — O0990 Supervision of high risk pregnancy, unspecified, unspecified trimester: Secondary | ICD-10-CM

## 2022-10-01 DIAGNOSIS — O0992 Supervision of high risk pregnancy, unspecified, second trimester: Secondary | ICD-10-CM

## 2022-10-01 DIAGNOSIS — N90810 Female genital mutilation status, unspecified: Secondary | ICD-10-CM

## 2022-10-01 DIAGNOSIS — Z8632 Personal history of gestational diabetes: Secondary | ICD-10-CM

## 2022-10-01 DIAGNOSIS — Z8759 Personal history of other complications of pregnancy, childbirth and the puerperium: Secondary | ICD-10-CM

## 2022-10-01 DIAGNOSIS — Z3A23 23 weeks gestation of pregnancy: Secondary | ICD-10-CM

## 2022-10-01 DIAGNOSIS — O09299 Supervision of pregnancy with other poor reproductive or obstetric history, unspecified trimester: Secondary | ICD-10-CM

## 2022-10-01 DIAGNOSIS — O09292 Supervision of pregnancy with other poor reproductive or obstetric history, second trimester: Secondary | ICD-10-CM

## 2022-10-01 NOTE — Progress Notes (Signed)
   PRENATAL VISIT NOTE  Subjective:  Deborah Lawson is a 32 y.o. [id number] at [redacted]w[redacted]d being seen today for ongoing prenatal care.  She is currently monitored for the following issues for this high-risk pregnancy and has Language barrier; History of gestational diabetes in prior pregnancy, currently pregnant; History of gestational hypertension; Vitamin D deficiency; White matter abnormality on MRI of brain; Supervision of high risk pregnancy, antepartum; Referred otalgia, left; Temporomandibular jaw dysfunction; History of cesarean delivery, currently pregnant; Female circumcision; and Chronic left-sided low back pain on their problem list.  Patient reports  fatigue .  Contractions: Not present. Vag. Bleeding: None.  Movement: Present. Denies leaking of fluid.   The following portions of the patient's history were reviewed and updated as appropriate: allergies, current medications, past family history, past medical history, past social history, past surgical history and problem list.   Objective:   Vitals:   10/01/22 1035  BP: 126/82  Pulse: 99  Weight: 198 lb 8 oz (90 kg)    Fetal Status: Fetal Heart Rate (bpm): 145   Movement: Present     General:  Alert, oriented and cooperative. Patient is in no acute distress.  Skin: Skin is warm and dry. No rash noted.   Cardiovascular: Normal heart rate noted  Respiratory: Normal respiratory effort, no problems with respiration noted  Abdomen: Soft, gravid, appropriate for gestational age.  Pain/Pressure: Absent     Pelvic: Cervical exam deferred        Extremities: Normal range of motion.  Edema: None  Mental Status: Normal mood and affect. Normal behavior. Normal judgment and thought content.   Assessment and Plan:  Pregnancy: [id number] at [redacted]w[redacted]d 1. Supervision of high risk pregnancy, antepartum 28w labs next time Rh pos Reviewed fatigue is not from cbc. Hgb normal. TSH was normal. Had workup for this in MAU.  BC: Doesn't want anything to  impact migraines or her medication. Started having migraines after nexplanon. Discussed Cu-IUD. She would like to consider. Discussed Lng-IUD as well.   2. Language barrier Arabic interpreter used  3. History of gestational hypertension BP today wnl Baseline labs wnl  4. History of gestational diabetes in prior pregnancy, currently pregnant A1C wnl  5. History of cesarean delivery, currently pregnant 2 SVD. Desires tolac  6. Female circumcision   7. Pregnancy with 23 completed weeks gestation   Preterm labor symptoms and general obstetric precautions including but not limited to vaginal bleeding, contractions, leaking of fluid and fetal movement were reviewed in detail with the patient. Please refer to After Visit Summary for other counseling recommendations.   Return in about 4 weeks (around 10/29/2022) for OB VISIT, MD or APP, 2 hr GTT/28w labs.  Future Appointments  Date Time Provider Department Center  10/06/2022  8:15 AM [facility] NURSE [facility]  10/06/2022  8:30 AM [facility] US2 [facility]  10/12/2022 10:10 AM Drema Dallas, DO [facility] None  10/21/2022  1:15 PM Adam Phenix, MD [facility]  11/08/2022 11:15 AM Hermina Staggers, MD [facility]  12/14/2022  8:30 AM Shamleffer, Konrad Dolores, MD [facility] None    Milas Hock, MD

## 2022-10-06 ENCOUNTER — Ambulatory Visit: Payer: Medicaid Other

## 2022-10-06 ENCOUNTER — Encounter: Payer: Self-pay | Admitting: Neurology

## 2022-10-11 NOTE — Progress Notes (Unsigned)
NEUROLOGY FOLLOW UP OFFICE NOTE  Deborah Lawson [id number]  Assessment/Plan:   1  Atypical headache/facial pain, initially left sided but now reports right sided pain as well, unclear etiology- description sounds cervicogenic but no structural abnormalities on cervical spine MRI.  MRI of brain unrevealing as well 2  Left sided arm pain/numbness - nothing on MRI of cervical spine to explain symptoms 3  Pregnancy at 8 weeks      After discussion with OBGYN, would rather avoid but recommend at least to avoid gabapentin until after organogenesis As she is describing some new symptoms, now right sided facial pain, consider repeat MRI of brain, but it is too early in her pregnancy to perform now.   Follow up 4 months.     Subjective:  Deborah Lawson is a 32 year old female who follows up for atypical facial pain.  She is accompanied by Arabic speaking interpreter   UPDATE:  Gabapentin was titrated to 600mg  BID.  However, she misread the prescription and has only been taking 300mg  daily.      In March, she endorsed right sided stabbing pain involving the face, head and neck.  MRI of trigeminal nerve without contrast (pregnant) on 09/17/2022 personally reviewed was negative.  Currently *** weeks pregnant.     Current NSAIDS/analgesics:  acetaminophen Current triptans:  none Current ergotamine:  none Current anti-emetic:  none Current muscle relaxants:  none Current Antihypertensive medications:  none Current Antidepressant medications:  none Current Anticonvulsant medications:  gabapentin 300mg  daily Current anti-CGRP:  none Current Vitamins/Herbal/Supplements:  none Current Antihistamines/Decongestants:  none Other therapy:  none Hormone/birth control:  none   HISTORY: She started having a headache around January  No prior history of headache.  It is a moderate to severe left sided pressure/aching pain involving the left temple, eye, ear and face, including along the jaw and  behind TMJ.  Sometimes with associated left sided/occipital numbness and tingling.  It comes and goes during the day the pain lessens and resolves by next day.  They occurs when she feels stressed or depressed, about 3 to 4 days a week.  Sometimes associated neck pain.  No associated nausea, vomiting, photophobia, phonophobia, visual disturbance, autonomic symptoms, numbness or weakness.  She went to the ED where she was diagnosed with a cavity.  She had the tooth pulled but pain persisted.  She was seen in the ED on 04/23/2020 where CT head personally reviewed was unremarkable and she was treated with headache cocktail.  She returned to the ED on 05/31/2020 for left sided TMJ pain where she was prescribed a prednisone taper and instructed to take Motrin as needed.    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  She was referred to endocrinology. She saw the dentist and had the cavity treated. Noted some improvement.  .  Referred to PT for neck/TMJ pain.  Still with neck pain radiating down left arm.  In March 2023, she was treated for left otitis media.  Still has left ear pain..  Treated for sinusitis and left otitis media.  Still with pain in left ear.  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  CT sinuses on 09/21/2021 revealed resolution of previous left maxillary sinusitis. She saw endocrinology regarding the pituitary  microadenoma with normal workup.  Due to ongoing cervical radiculitis, MRI of cervical spine was performed on 07/22/2021, which showed mild cervical spondylosis but no significant spinal canal or foraminal stenosis.  NCV-EMG of left upper extremity on 02/11/2022 was normal.    She has history of chronic low back pain radiating down the left leg.   This past week, she has worsening left leg pain with anterior numbness from the thigh down to the foot.  No trauma.     Past NSAIDS/analgesics:  ibuprofen Past abortive triptans:  none Past abortive ergotamine:  none Past muscle relaxants:  none Past anti-emetic:  none Past antihypertensive medications:  nifedipine Past antidepressant medications:  none Past anticonvulsant medications:  gabapentin 600mg  BID (stopped due to pregnancy) Past anti-CGRP:  none Past vitamins/Herbal/Supplements:  none Past antihistamines/decongestants:  none Other past therapies:  none  PAST MEDICAL HISTORY: Past Medical History:  Diagnosis Date   Abnormal computed tomography of paranasal sinus 07/06/2021   Gestational diabetes    Gestational hypertension     MEDICATIONS: Current Outpatient Medications on File Prior to Visit  Medication Sig Dispense Refill   acetaminophen (TYLENOL) 325 MG tablet Take 650 mg by mouth every 6 (six) hours as needed.     Blood Pressure Monitoring (BLOOD PRESSURE KIT) DEVI 1 each by Does not apply route as needed. (Patient not taking: Reported on 09/02/2022) 1 each 0   cefadroxil (DURICEF) 500 MG capsule Take 1 capsule (500 mg total) by mouth 2 (two) times daily. 14 capsule 0   cholecalciferol (VITAMIN D3) 25 MCG (1000 UNIT) tablet Take 1,000 Units by mouth daily. (Patient not taking: Reported on 08/30/2022)     cyclobenzaprine (FLEXERIL) 10 MG tablet Take 1 tablet (10 mg total) by mouth 2 (two) times daily as needed for muscle spasms. 20 tablet 0   metoCLOPramide (REGLAN) 10 MG tablet Take 1 tablet (10 mg total) by mouth every 6 (six) hours as needed for nausea. 60 tablet 6   Prenatal Vit-Fe Fumarate-FA (PRENATAL PLUS VITAMIN/MINERAL) 27-1 MG TABS Take 1 tablet by mouth daily. 30 tablet 11   promethazine (PHENERGAN) 25 MG tablet Take 1 tablet (25 mg total) by mouth every 6 (six) hours as needed for nausea or vomiting. (Patient not taking: Reported on 06/23/2022) 30 tablet 0    [DISCONTINUED] NIFEdipine (PROCARDIA XL) 30 MG 24 hr tablet Take 1 tablet (30 mg total) by mouth daily. 30 tablet 1   No current facility-administered medications on file prior to visit.    ALLERGIES: No Known Allergies  FAMILY HISTORY: Family History  Problem Relation Age of Onset   Hypertension Father       Objective:  *** General: No acute distress.  Patient appears well-groomed.   Head:  Normocephalic/atraumatic Eyes:  Fundi examined but not visualized Neck: supple, left sided paraspinal tenderness, full range of motion Neurological Exam: alert and oriented.  Speech fluent and not dysarthric, language intact.  CN II-XII intact. Bulk and tone normal, muscle strength 5/5 throughout.  Sensation to light touch intact.  Deep tendon reflexes 2+ throughout.  Finger to nose testing intact.  Gait normal, Romberg negative.   Shon Millet, DO  CC: Peggye Pitt Acosta,MD

## 2022-10-12 ENCOUNTER — Ambulatory Visit: Payer: Medicaid Other | Admitting: Neurology

## 2022-10-12 ENCOUNTER — Encounter: Payer: Self-pay | Admitting: Neurology

## 2022-10-12 VITALS — BP 101/64 | HR 99 | Ht 65.0 in | Wt 202.0 lb

## 2022-10-12 DIAGNOSIS — G501 Atypical facial pain: Secondary | ICD-10-CM

## 2022-10-12 DIAGNOSIS — M542 Cervicalgia: Secondary | ICD-10-CM

## 2022-10-12 DIAGNOSIS — R519 Headache, unspecified: Secondary | ICD-10-CM | POA: Diagnosis not present

## 2022-10-12 DIAGNOSIS — M5412 Radiculopathy, cervical region: Secondary | ICD-10-CM

## 2022-10-12 NOTE — Patient Instructions (Signed)
Continue to monitor off medications Follow up around December (at least a month after having your baby)

## 2022-10-13 ENCOUNTER — Ambulatory Visit (HOSPITAL_BASED_OUTPATIENT_CLINIC_OR_DEPARTMENT_OTHER): Payer: Medicaid Other

## 2022-10-13 ENCOUNTER — Ambulatory Visit: Payer: Medicaid Other | Attending: Obstetrics | Admitting: *Deleted

## 2022-10-13 ENCOUNTER — Other Ambulatory Visit: Payer: Self-pay | Admitting: *Deleted

## 2022-10-13 VITALS — BP 127/67 | HR 99

## 2022-10-13 DIAGNOSIS — Z8759 Personal history of other complications of pregnancy, childbirth and the puerperium: Secondary | ICD-10-CM

## 2022-10-13 DIAGNOSIS — Z3A25 25 weeks gestation of pregnancy: Secondary | ICD-10-CM | POA: Diagnosis not present

## 2022-10-13 DIAGNOSIS — N90810 Female genital mutilation status, unspecified: Secondary | ICD-10-CM

## 2022-10-13 DIAGNOSIS — O09292 Supervision of pregnancy with other poor reproductive or obstetric history, second trimester: Secondary | ICD-10-CM | POA: Diagnosis present

## 2022-10-13 DIAGNOSIS — O34219 Maternal care for unspecified type scar from previous cesarean delivery: Secondary | ICD-10-CM | POA: Insufficient documentation

## 2022-10-13 DIAGNOSIS — Z362 Encounter for other antenatal screening follow-up: Secondary | ICD-10-CM | POA: Insufficient documentation

## 2022-10-13 DIAGNOSIS — O09299 Supervision of pregnancy with other poor reproductive or obstetric history, unspecified trimester: Secondary | ICD-10-CM

## 2022-10-13 DIAGNOSIS — O0990 Supervision of high risk pregnancy, unspecified, unspecified trimester: Secondary | ICD-10-CM

## 2022-10-14 ENCOUNTER — Telehealth: Payer: Self-pay | Admitting: General Practice

## 2022-10-14 NOTE — Telephone Encounter (Signed)
Patient called into front office stating she cannot take tylenol because it has pork in it and that is against her religion. Asked patient what kind of tylenol she has if it is capsules or tablets. Patient states she thinks the tablets. Told patient to send me a picture in mychart and I will make sure it is safe. Discussed some tylenol has pork in it but some do not. Patient verbalized understanding.

## 2022-10-21 ENCOUNTER — Encounter: Payer: Medicaid Other | Admitting: Obstetrics & Gynecology

## 2022-10-25 MED ORDER — CYCLOBENZAPRINE HCL 10 MG PO TABS
10.0000 mg | ORAL_TABLET | Freq: Three times a day (TID) | ORAL | 0 refills | Status: DC | PRN
Start: 2022-10-25 — End: 2023-01-13

## 2022-10-26 ENCOUNTER — Inpatient Hospital Stay (HOSPITAL_COMMUNITY)
Admission: AD | Admit: 2022-10-26 | Discharge: 2022-10-26 | Disposition: A | Discharge status: Home / Self Care | Payer: Medicaid Other | Attending: Obstetrics & Gynecology | Admitting: Obstetrics & Gynecology

## 2022-10-26 ENCOUNTER — Encounter (HOSPITAL_COMMUNITY): Payer: Self-pay | Admitting: Obstetrics & Gynecology

## 2022-10-26 ENCOUNTER — Other Ambulatory Visit: Payer: Self-pay

## 2022-10-26 DIAGNOSIS — O09292 Supervision of pregnancy with other poor reproductive or obstetric history, second trimester: Secondary | ICD-10-CM | POA: Diagnosis present

## 2022-10-26 DIAGNOSIS — R0781 Pleurodynia: Secondary | ICD-10-CM | POA: Diagnosis not present

## 2022-10-26 DIAGNOSIS — O321XX0 Maternal care for breech presentation, not applicable or unspecified: Secondary | ICD-10-CM | POA: Diagnosis present

## 2022-10-26 DIAGNOSIS — N9489 Other specified conditions associated with female genital organs and menstrual cycle: Secondary | ICD-10-CM

## 2022-10-26 DIAGNOSIS — M25551 Pain in right hip: Secondary | ICD-10-CM | POA: Diagnosis not present

## 2022-10-26 DIAGNOSIS — Z3A27 27 weeks gestation of pregnancy: Secondary | ICD-10-CM

## 2022-10-26 DIAGNOSIS — R079 Chest pain, unspecified: Secondary | ICD-10-CM

## 2022-10-26 DIAGNOSIS — W19XXXA Unspecified fall, initial encounter: Secondary | ICD-10-CM

## 2022-10-26 DIAGNOSIS — M79604 Pain in right leg: Secondary | ICD-10-CM

## 2022-10-26 LAB — URINALYSIS, ROUTINE W REFLEX MICROSCOPIC
Bilirubin Urine: NEGATIVE
Glucose, UA: NEGATIVE mg/dL
Hgb urine dipstick: NEGATIVE
Ketones, ur: 5 mg/dL — AB
Nitrite: NEGATIVE
Protein, ur: 30 mg/dL — AB
Specific Gravity, Urine: 1.031 — ABNORMAL HIGH (ref 1.005–1.030)
Squamous Epithelial / HPF: >50 /HPF (ref 0–5)
pH: 5 (ref 5.0–8.0)

## 2022-10-26 LAB — FETAL FIBRONECTIN: Fetal Fibronectin: NEGATIVE

## 2022-10-26 NOTE — MAU Provider Note (Signed)
Chief Complaint:  Fall   Event Date/Time   First Provider Initiated Contact with Patient 10/26/22 2135     HPI: Deborah Lawson is a 32 y.o. [id number] at 69w2dwho presents to maternity admissions reporting having fallen yesterday in the shower, landing on her buttocks.  Afterward, her right leg started hurting as well as her right flank area.  Baby movement was normal and she states she did not feel contractions. . She reports good fetal movement, denies LOF, vaginal bleeding, h/a, dizziness, or fever/chills.    Fall The accident occurred 12 to 24 hours ago. Fall occurred: shower. There was no blood loss. The point of impact was the buttocks. The pain is present in the right hip and right upper leg (right side of abdomen, lower ribs). Pertinent negatives include no abdominal pain, fever, headaches, loss of consciousness or numbness. She has tried acetaminophen for the symptoms. The treatment provided mild relief.   RN Note: Deborah Lawson is a 32 y.o. at [redacted]w[redacted]d here in MAU reporting: had a fall yesterday in the shower, fell on her butt. Shortly after her lower back hurt from the fall and her right side started hurting from her abdomen down to her leg.  She took flexiril at 5am and tylenol at 7pm and has not felt much relief  Past Medical History: Past Medical History:  Diagnosis Date   Abnormal computed tomography of paranasal sinus 07/06/2021   Gestational diabetes    Gestational hypertension     Past obstetric history: OB History  Gravida Para Term Preterm AB Living  5 3 3  0 1 3  SAB IAB Ectopic Multiple Live Births  1 0 0 0 3    # Outcome Date GA Lbr Len/2nd Weight Sex Type Anes PTL Lv  5 Current           4 Term 10/26/19 [redacted]w[redacted]d  3147 g F CS-LTranv Spinal  LIV     Complications: Breech presentation  3 SAB           2 Term      Vag-Spont     1 Term      Vag-Spont       Past Surgical History: Past Surgical History:  Procedure Laterality Date   CESAREAN  SECTION N/A 10/26/2019   Procedure: CESAREAN SECTION;  Surgeon: Reva Bores, MD;  Location: [facility]S;  Service: Obstetrics;  Laterality: N/A;   MANDIBLE SURGERY      Family History: Family History  Problem Relation Age of Onset   Hypertension Father     Social History: Social History   Tobacco Use   Smoking status: Never    Passive exposure: Never   Smokeless tobacco: Never  Vaping Use   Vaping status: Never Used  Substance Use Topics   Alcohol use: Never   Drug use: Never    Allergies:  Allergies  Allergen Reactions   Pork-Derived Products     Religious    Meds:  Medications Prior to Admission  Medication Sig Dispense Refill Last Dose   acetaminophen (TYLENOL) 325 MG tablet Take 650 mg by mouth every 6 (six) hours as needed.   10/26/2022 at 1900   cyclobenzaprine (FLEXERIL) 10 MG tablet Take 1 tablet (10 mg total) by mouth every 8 (eight) hours as needed for muscle spasms. 20 tablet 0 10/26/2022 at 0500   Prenatal Vit-Fe Fumarate-FA (PRENATAL PLUS VITAMIN/MINERAL) 27-1 MG TABS Take 1 tablet by mouth daily. 30 tablet 11 10/26/2022 at 0900  Blood Pressure Monitoring (BLOOD PRESSURE KIT) DEVI 1 each by Does not apply route as needed. 1 each 0    cefadroxil (DURICEF) 500 MG capsule Take 1 capsule (500 mg total) by mouth 2 (two) times daily. (Patient not taking: Reported on 10/13/2022) 14 capsule 0    cholecalciferol (VITAMIN D3) 25 MCG (1000 UNIT) tablet Take 1,000 Units by mouth daily. (Patient not taking: Reported on 10/12/2022)      metoCLOPramide (REGLAN) 10 MG tablet Take 1 tablet (10 mg total) by mouth every 6 (six) hours as needed for nausea. (Patient not taking: Reported on 10/12/2022) 60 tablet 6    promethazine (PHENERGAN) 25 MG tablet Take 1 tablet (25 mg total) by mouth every 6 (six) hours as needed for nausea or vomiting. 30 tablet 0     I have reviewed patient's Past Medical Hx, Surgical Hx, Family Hx, Social Hx, medications and allergies.   ROS:  Review of  Systems  Constitutional:  Negative for fever.  Gastrointestinal:  Negative for abdominal pain.  Neurological:  Negative for loss of consciousness, numbness and headaches.   Other systems negative  Physical Exam  Patient Vitals for the past 24 hrs:  BP Temp Temp src Pulse Resp SpO2 Height Weight  10/26/22 2109 126/77 98.1 F (36.7 C) Oral 94 19 98 % -- --  10/26/22 2050 -- -- -- -- -- -- 5\' 5"  (1.651 m) 91.2 kg   Constitutional: Well-developed, well-nourished female in no acute distress.  Cardiovascular: normal rate  Respiratory: normal effort   RIght lower rib area slightly tender GI: Abd soft, non-tender, gravid appropriate for gestational age.   No rebound or guarding. MS: Extremities nontender, no edema, normal ROM   Able to bear weight.   Neurologic: Alert and oriented x 4.  GU: Neg CVAT.  PELVIC EXAM: Dilation: Closed Effacement (%): Thick Exam by:: Wynelle Bourgeois, CNM    FHT:  Baseline 150 , moderate variability, accelerations present, no decelerations Contractions:  Irregular, mild, not felt by patient. Lasting 30-40sec   Labs: Results for orders placed or performed during the hospital encounter of 10/26/22 (from the past 24 hour(s))  Urinalysis, Routine w reflex microscopic -Urine, Clean Catch     Status: Abnormal   Collection Time: 10/26/22  9:12 PM  Result Value Ref Range   Color, Urine AMBER (A) YELLOW   APPearance CLOUDY (A) CLEAR   Specific Gravity, Urine 1.031 (H) 1.005 - 1.030   pH 5.0 5.0 - 8.0   Glucose, UA NEGATIVE NEGATIVE mg/dL   Hgb urine dipstick NEGATIVE NEGATIVE   Bilirubin Urine NEGATIVE NEGATIVE   Ketones, ur 5 (A) NEGATIVE mg/dL   Protein, ur 30 (A) NEGATIVE mg/dL   Nitrite NEGATIVE NEGATIVE   Leukocytes,Ua LARGE (A) NEGATIVE   RBC / HPF 0-5 0 - 5 RBC/hpf   WBC, UA 21-50 0 - 5 WBC/hpf   Bacteria, UA RARE (A) NONE SEEN   Squamous Epithelial / HPF >50 0 - 5 /HPF   Mucus PRESENT    Uric Acid Crys, UA PRESENT    Non Squamous Epithelial 0-5  (A) NONE SEEN  Fetal fibronectin     Status: None   Collection Time: 10/26/22 10:16 PM  Result Value Ref Range   Fetal Fibronectin NEGATIVE NEGATIVE    O/Positive/-- (04/24 1118)  Imaging:    MAU Course/MDM: I have reviewed the triage vital signs and the nursing notes.   Pertinent labs & imaging results that were available during my care of the patient were  reviewed by me and considered in my medical decision making (see chart for details).      I have reviewed her medical records including past results, notes and treatments.   I have ordered labs and reviewed results. UA is contaminated,sent to culture  Fetal fibronectin done and is negative.  Cervix is long and closed NST reviewed Consult Dr Despina Hidden with presentation, exam findings and test results.  Treatments in MAU included EFM  Discussed using Tylenol and Flexeril   May use half tablet during the day to avoid sleepiness side effect.  Recommend ice to side.  Message sent to office to arrange PT for leg pain. .    Assessment: Single IUP at [redacted]w[redacted]d Status post fall Right leg pain Right side pain Mild uterine cramps with negative fetal fibronectin  Plan: Discharge home Discussed using Tylenol and Flexeril   May use half tablet during the day to avoid sleepiness side effect.   Recommend ice to side.   Message sent to office to arrange PT for leg pain. .   Preterm Labor precautions and fetal kick counts Follow up in Office for prenatal visits and recheck Encouraged to return if she develops worsening of symptoms, increase in pain, fever, or other concerning symptoms.   Pt stable at time of discharge.  Wynelle Bourgeois CNM, MSN Certified Nurse-Midwife 10/26/2022 9:35 PM

## 2022-10-26 NOTE — MAU Note (Addendum)
..  Deborah Lawson is a 32 y.o. at [redacted]w[redacted]d here in MAU reporting: had a fall yesterday in the shower, fell on her butt. Shortly after her lower back hurt from the fall and her right side started hurting from her abdomen down to her leg.  She took flexiril at 5am and tylenol at 7pm and has not felt much relief.   Pain score: 8/10  Vitals:   10/26/22 2109  BP: 126/77  Pulse: 94  Resp: 19  Temp: 98.1 F (36.7 C)  SpO2: 98%     [id number] in room 140's Lab orders placed from triage:  UA  Interpretor used for triage Merrat [id number] Afterwards patient declined interpretor.

## 2022-10-27 ENCOUNTER — Other Ambulatory Visit: Payer: Self-pay

## 2022-10-27 DIAGNOSIS — M79604 Pain in right leg: Secondary | ICD-10-CM

## 2022-10-28 ENCOUNTER — Other Ambulatory Visit: Payer: Self-pay | Admitting: Advanced Practice Midwife

## 2022-10-28 ENCOUNTER — Telehealth: Payer: Self-pay | Admitting: *Deleted

## 2022-10-28 MED ORDER — CEFADROXIL 500 MG PO CAPS: 500.0000 mg | ORAL_CAPSULE | Freq: Two times a day (BID) | ORAL | 0 refills | Status: AC

## 2022-10-28 NOTE — Progress Notes (Signed)
Urine culture showed infection with Diptheroids No susc testing was done Will Rx Duricef and do test of cure

## 2022-10-28 NOTE — Telephone Encounter (Addendum)
-----   Message from Wynelle Bourgeois sent at 10/28/2022 12:16 AM EDT ----- Regarding: needs treatment for UTI I sent in [facility] for UTI  Can you notify her?  Thanks Wynelle Bourgeois  Per chart review, pt has read note in Mychart from Wynelle Bourgeois regarding result and Rx sent to pharmacy. Telephone encounter opened in error

## 2022-11-05 ENCOUNTER — Other Ambulatory Visit: Payer: Self-pay

## 2022-11-05 DIAGNOSIS — O09299 Supervision of pregnancy with other poor reproductive or obstetric history, unspecified trimester: Secondary | ICD-10-CM

## 2022-11-05 DIAGNOSIS — O0990 Supervision of high risk pregnancy, unspecified, unspecified trimester: Secondary | ICD-10-CM

## 2022-11-08 ENCOUNTER — Other Ambulatory Visit: Payer: Medicaid Other

## 2022-11-08 ENCOUNTER — Ambulatory Visit (INDEPENDENT_AMBULATORY_CARE_PROVIDER_SITE_OTHER): Payer: Medicaid Other | Admitting: Obstetrics and Gynecology

## 2022-11-08 ENCOUNTER — Encounter: Payer: Self-pay | Admitting: Obstetrics and Gynecology

## 2022-11-08 ENCOUNTER — Encounter: Payer: Medicaid Other | Admitting: Obstetrics and Gynecology

## 2022-11-08 VITALS — BP 120/72 | HR 103 | Wt 200.6 lb

## 2022-11-08 DIAGNOSIS — Z603 Acculturation difficulty: Secondary | ICD-10-CM

## 2022-11-08 DIAGNOSIS — O0990 Supervision of high risk pregnancy, unspecified, unspecified trimester: Secondary | ICD-10-CM

## 2022-11-08 DIAGNOSIS — Z758 Other problems related to medical facilities and other health care: Secondary | ICD-10-CM

## 2022-11-08 DIAGNOSIS — N90810 Female genital mutilation status, unspecified: Secondary | ICD-10-CM

## 2022-11-08 DIAGNOSIS — Z3A30 30 weeks gestation of pregnancy: Secondary | ICD-10-CM

## 2022-11-08 DIAGNOSIS — Z8759 Personal history of other complications of pregnancy, childbirth and the puerperium: Secondary | ICD-10-CM

## 2022-11-08 DIAGNOSIS — Z8632 Personal history of gestational diabetes: Secondary | ICD-10-CM

## 2022-11-08 DIAGNOSIS — O34219 Maternal care for unspecified type scar from previous cesarean delivery: Secondary | ICD-10-CM

## 2022-11-08 DIAGNOSIS — O09299 Supervision of pregnancy with other poor reproductive or obstetric history, unspecified trimester: Secondary | ICD-10-CM

## 2022-11-08 NOTE — Progress Notes (Signed)
Subjective:  Deborah Lawson is a 32 y.o. [id number] at [redacted]w[redacted]d being seen today for ongoing prenatal care.  She is currently monitored for the following issues for this low-risk pregnancy and has Language barrier; History of gestational diabetes in prior pregnancy, currently pregnant; History of gestational hypertension; Vitamin D deficiency; White matter abnormality on MRI of brain; Supervision of high risk pregnancy, antepartum; Referred otalgia, left; Temporomandibular jaw dysfunction; History of cesarean delivery, currently pregnant; Female circumcision; and Chronic left-sided low back pain on their problem list.  Patient reports  general discomforts of pregnancy .  Contractions: Not present. Vag. Bleeding: None.  Movement: Present. Denies leaking of fluid.   The following portions of the patient's history were reviewed and updated as appropriate: allergies, current medications, past family history, past medical history, past social history, past surgical history and problem list. Problem list updated.  Objective:   Vitals:   11/08/22 0948  BP: 120/72  Pulse: (!) 103  Weight: 200 lb 9.6 oz (91 kg)    Fetal Status: Fetal Heart Rate (bpm): 136 Fundal Height: 29 cm Movement: Present     General:  Alert, oriented and cooperative. Patient is in no acute distress.  Skin: Skin is warm and dry. No rash noted.   Cardiovascular: Normal heart rate noted  Respiratory: Normal respiratory effort, no problems with respiration noted  Abdomen: Soft, gravid, appropriate for gestational age. Pain/Pressure: Present (pain in back)     Pelvic:  Cervical exam deferred        Extremities: Normal range of motion.  Edema: None  Mental Status: Normal mood and affect. Normal behavior. Normal judgment and thought content.   Urinalysis:      Assessment and Plan:  Pregnancy: [id number] at [redacted]w[redacted]d  1. Supervision of high risk pregnancy, antepartum Stable Glucola today Growth scan next month  2. History of  gestational diabetes in prior pregnancy, currently pregnant Glucola today  3. History of gestational hypertension BP stable No S/Sx at present  4. History of cesarean delivery, currently pregnant Desires TOLAC Consented today  5. 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 Please refer to After Visit Summary for other counseling recommendations.  Return in about 2 weeks (around 11/22/2022) for OB visit, face to face, any provider.   Hermina Staggers, MD

## 2022-11-09 LAB — CBC
Hematocrit: 34.4 % (ref 34.0–46.6)
Hemoglobin: 11.3 g/dL (ref 11.1–15.9)
MCH: 27.7 pg (ref 26.6–33.0)
MCHC: 32.8 g/dL (ref 31.5–35.7)
MCV: 84 fL (ref 79–97)
Platelets: 362 10*3/uL (ref 150–450)
RBC: 4.08 x10E6/uL (ref 3.77–5.28)
RDW: 13.4 % (ref 11.7–15.4)
WBC: 6.8 10*3/uL (ref 3.4–10.8)

## 2022-11-09 LAB — GLUCOSE TOLERANCE, 2 HOURS W/ 1HR
Glucose, 1 hour: 140 mg/dL (ref 70–179)
Glucose, 2 hour: 139 mg/dL (ref 70–152)
Glucose, Fasting: 80 mg/dL (ref 70–91)

## 2022-11-09 LAB — RPR: RPR Ser Ql: NONREACTIVE

## 2022-11-09 LAB — HIV ANTIBODY (ROUTINE TESTING W REFLEX): HIV Screen 4th Generation wRfx: NONREACTIVE

## 2022-11-26 ENCOUNTER — Encounter: Payer: Self-pay | Admitting: *Deleted

## 2022-11-26 ENCOUNTER — Ambulatory Visit (INDEPENDENT_AMBULATORY_CARE_PROVIDER_SITE_OTHER): Payer: Medicaid Other | Admitting: Certified Nurse Midwife

## 2022-11-26 ENCOUNTER — Other Ambulatory Visit: Payer: Self-pay

## 2022-11-26 VITALS — BP 115/77 | HR 101 | Wt 202.0 lb

## 2022-11-26 DIAGNOSIS — Z758 Other problems related to medical facilities and other health care: Secondary | ICD-10-CM

## 2022-11-26 DIAGNOSIS — Z603 Acculturation difficulty: Secondary | ICD-10-CM

## 2022-11-26 DIAGNOSIS — Z8632 Personal history of gestational diabetes: Secondary | ICD-10-CM

## 2022-11-26 DIAGNOSIS — Z3A31 31 weeks gestation of pregnancy: Secondary | ICD-10-CM

## 2022-11-26 DIAGNOSIS — O09293 Supervision of pregnancy with other poor reproductive or obstetric history, third trimester: Secondary | ICD-10-CM

## 2022-11-26 DIAGNOSIS — O09299 Supervision of pregnancy with other poor reproductive or obstetric history, unspecified trimester: Secondary | ICD-10-CM

## 2022-11-26 DIAGNOSIS — O0993 Supervision of high risk pregnancy, unspecified, third trimester: Secondary | ICD-10-CM

## 2022-11-26 DIAGNOSIS — O34219 Maternal care for unspecified type scar from previous cesarean delivery: Secondary | ICD-10-CM

## 2022-11-26 DIAGNOSIS — Z23 Encounter for immunization: Secondary | ICD-10-CM

## 2022-11-26 DIAGNOSIS — J3089 Other allergic rhinitis: Secondary | ICD-10-CM

## 2022-11-26 DIAGNOSIS — O0990 Supervision of high risk pregnancy, unspecified, unspecified trimester: Secondary | ICD-10-CM

## 2022-11-26 NOTE — Progress Notes (Signed)
PRENATAL VISIT NOTE  Subjective:  Deborah Lawson is a 32 y.o. [id number] at [redacted]w[redacted]d being seen today for ongoing prenatal care.  She is currently monitored for the following issues for this high-risk pregnancy and has Language barrier; History of gestational diabetes in prior pregnancy, currently pregnant; History of gestational hypertension; Vitamin D deficiency; White matter abnormality on MRI of brain; Supervision of high risk pregnancy, antepartum; Referred otalgia, left; Temporomandibular jaw dysfunction; History of cesarean delivery, currently pregnant; Female circumcision; and Chronic left-sided low back pain on their problem list.  Patient reports  abdominal pressure when standing after sitting for too long.  .  Contractions: Not present. Vag. Bleeding: None.  Movement: Present. Denies leaking of fluid.   The following portions of the patient's history were reviewed and updated as appropriate: allergies, current medications, past family history, past medical history, past social history, past surgical history and problem list.   Objective:   Vitals:   11/26/22 1037  BP: 115/77  Pulse: (!) 101  Weight: 202 lb (91.6 kg)    Fetal Status: Fetal Heart Rate (bpm): 145 Fundal Height: 35 cm Movement: Present     General:  Alert, oriented and cooperative. Patient is in no acute distress.  Skin: Skin is warm and dry. No rash noted.   Cardiovascular: Normal heart rate noted  Respiratory: Normal respiratory effort, no problems with respiration noted  Abdomen: Soft, gravid, appropriate for gestational age.  Pain/Pressure: Present (pelvic pressure)     Pelvic: Cervical exam deferred        Extremities: Normal range of motion.  Edema: None  Mental Status: Normal mood and affect. Normal behavior. Normal judgment and thought content.   Assessment and Plan:  Pregnancy: [id number] at [redacted]w[redacted]d 1. Supervision of high risk pregnancy in third trimester - Patient doing well.  - Reports vigorous  and frequent fetal movement.  - Flu vaccine trivalent PF, 6mos and older(Flulaval,Afluria,Fluarix,Fluzone)  2. [redacted] weeks gestation of pregnancy - Encouraged the use of a maternity support belt.  - Also discussed stretching and frequent movement to help with discomfort.  - Patient agreeable to plan of care.  - Tdap and Flu vaccine given today   3. Language barrier - Patient speaks English   4. History of cesarean delivery, currently pregnant - Patient desires TOLAC.  - Consents signed at a previous visit,   5. History of gestational diabetes in prior pregnancy, currently pregnant - Normal GTT in current pregnancy.    7. Environmental and seasonal allergies - Patient reports that the allergies in her apartment as causing nasal congestion and frequent sinus infections.  - She states that regularly takes medication to keep her allergies at bay, but is unsure of what is actually causing her allergies to flare up. She requests a referral to the allergist.  - Ambulatory referral to Allergy  Preterm labor symptoms and general obstetric precautions including but not limited to vaginal bleeding, contractions, leaking of fluid and fetal movement were reviewed in detail with the patient. Please refer to After Visit Summary for other counseling recommendations.   Return in about 2 weeks (around 12/10/2022) for LOB.  Future Appointments  Date Time Provider Department Center  11/30/2022 10:15 AM [facility] NURSE [facility]  11/30/2022 10:30 AM [facility] US3 [facility]  12/13/2022  9:35 AM Reva Bores, MD [facility]  12/14/2022  8:30 AM Shamleffer, Konrad Dolores, MD [facility] None  02/24/2023 11:10 AM Drema Dallas, DO [facility] None    Vasilisa Vore Danella Deis) Suzie Portela,  MSN, CNM  Center for [facility]  11/26/2022 12:26 PM

## 2022-11-30 ENCOUNTER — Ambulatory Visit: Payer: Medicaid Other | Attending: Maternal & Fetal Medicine

## 2022-11-30 ENCOUNTER — Ambulatory Visit: Payer: Medicaid Other | Admitting: *Deleted

## 2022-11-30 VITALS — BP 117/65 | HR 99

## 2022-11-30 DIAGNOSIS — Z8759 Personal history of other complications of pregnancy, childbirth and the puerperium: Secondary | ICD-10-CM | POA: Diagnosis present

## 2022-11-30 DIAGNOSIS — O09299 Supervision of pregnancy with other poor reproductive or obstetric history, unspecified trimester: Secondary | ICD-10-CM | POA: Diagnosis present

## 2022-11-30 DIAGNOSIS — Z8632 Personal history of gestational diabetes: Secondary | ICD-10-CM | POA: Diagnosis present

## 2022-11-30 DIAGNOSIS — Z3A32 32 weeks gestation of pregnancy: Secondary | ICD-10-CM | POA: Diagnosis not present

## 2022-11-30 DIAGNOSIS — N90810 Female genital mutilation status, unspecified: Secondary | ICD-10-CM | POA: Diagnosis present

## 2022-11-30 DIAGNOSIS — O0990 Supervision of high risk pregnancy, unspecified, unspecified trimester: Secondary | ICD-10-CM

## 2022-11-30 DIAGNOSIS — O09293 Supervision of pregnancy with other poor reproductive or obstetric history, third trimester: Secondary | ICD-10-CM | POA: Diagnosis not present

## 2022-11-30 DIAGNOSIS — O34219 Maternal care for unspecified type scar from previous cesarean delivery: Secondary | ICD-10-CM | POA: Diagnosis not present

## 2022-12-06 ENCOUNTER — Other Ambulatory Visit: Payer: Self-pay

## 2022-12-06 ENCOUNTER — Encounter (HOSPITAL_COMMUNITY): Payer: Self-pay | Admitting: Obstetrics and Gynecology

## 2022-12-06 ENCOUNTER — Inpatient Hospital Stay (HOSPITAL_COMMUNITY)
Admission: AD | Admit: 2022-12-06 | Discharge: 2022-12-07 | Disposition: A | Discharge status: Home / Self Care | Payer: Medicaid Other | Attending: Obstetrics and Gynecology | Admitting: Obstetrics and Gynecology

## 2022-12-06 ENCOUNTER — Inpatient Hospital Stay (HOSPITAL_COMMUNITY): Payer: Medicaid Other

## 2022-12-06 DIAGNOSIS — A084 Viral intestinal infection, unspecified: Secondary | ICD-10-CM | POA: Diagnosis present

## 2022-12-06 DIAGNOSIS — O218 Other vomiting complicating pregnancy: Secondary | ICD-10-CM | POA: Insufficient documentation

## 2022-12-06 DIAGNOSIS — O26899 Other specified pregnancy related conditions, unspecified trimester: Secondary | ICD-10-CM

## 2022-12-06 DIAGNOSIS — Z3A33 33 weeks gestation of pregnancy: Secondary | ICD-10-CM | POA: Diagnosis not present

## 2022-12-06 DIAGNOSIS — O26893 Other specified pregnancy related conditions, third trimester: Secondary | ICD-10-CM | POA: Insufficient documentation

## 2022-12-06 LAB — COMPREHENSIVE METABOLIC PANEL
ALT: 21 U/L (ref 0–44)
AST: 21 U/L (ref 15–41)
Albumin: 2.5 g/dL — ABNORMAL LOW (ref 3.5–5.0)
Alkaline Phosphatase: 114 U/L (ref 38–126)
Anion gap: 10 (ref 5–15)
BUN: <5 mg/dL — ABNORMAL LOW (ref 6–20)
CO2: 20 mmol/L — ABNORMAL LOW (ref 22–32)
Calcium: 8.9 mg/dL (ref 8.9–10.3)
Chloride: 104 mmol/L (ref 98–111)
Creatinine, Ser: 0.38 mg/dL — ABNORMAL LOW (ref 0.44–1.00)
GFR, Estimated: >60 mL/min (ref >60)
Glucose, Bld: 84 mg/dL (ref 70–99)
Potassium: 3.5 mmol/L (ref 3.5–5.1)
Sodium: 134 mmol/L — ABNORMAL LOW (ref 135–145)
Total Bilirubin: 0.5 mg/dL (ref 0.3–1.2)
Total Protein: 6.6 g/dL (ref 6.5–8.1)

## 2022-12-06 LAB — URINALYSIS, ROUTINE W REFLEX MICROSCOPIC
Bacteria, UA: NONE SEEN
Bilirubin Urine: NEGATIVE
Glucose, UA: NEGATIVE mg/dL
Hgb urine dipstick: NEGATIVE
Ketones, ur: NEGATIVE mg/dL
Nitrite: NEGATIVE
Protein, ur: NEGATIVE mg/dL
Specific Gravity, Urine: 1.008 (ref 1.005–1.030)
pH: 6 (ref 5.0–8.0)

## 2022-12-06 LAB — CBC
HCT: 34.8 % — ABNORMAL LOW (ref 36.0–46.0)
Hemoglobin: 11.4 g/dL — ABNORMAL LOW (ref 12.0–15.0)
MCH: 27.7 pg (ref 26.0–34.0)
MCHC: 32.8 g/dL (ref 30.0–36.0)
MCV: 84.7 fL (ref 80.0–100.0)
Platelets: 328 10*3/uL (ref 150–400)
RBC: 4.11 MIL/uL (ref 3.87–5.11)
RDW: 13.7 % (ref 11.5–15.5)
WBC: 7.4 10*3/uL (ref 4.0–10.5)
nRBC: 0 % (ref 0.0–0.2)

## 2022-12-06 LAB — AMYLASE: Amylase: 29 U/L (ref 28–100)

## 2022-12-06 LAB — LIPASE, BLOOD: Lipase: 20 U/L (ref 11–51)

## 2022-12-06 MED ORDER — ONDANSETRON HCL 4 MG/2ML IJ SOLN
4.0000 mg | Freq: Once | INTRAMUSCULAR | Status: AC
  Administered 2022-12-06: 4 mg via INTRAVENOUS
  Filled 2022-12-06: qty 2

## 2022-12-06 MED ORDER — LACTATED RINGERS IV BOLUS
1000.0000 mL | Freq: Once | INTRAVENOUS | Status: AC
  Administered 2022-12-06: 1000 mL via INTRAVENOUS

## 2022-12-06 MED ORDER — SODIUM CHLORIDE 0.9 % IV SOLN
8.0000 mg | Freq: Once | INTRAVENOUS | Status: DC
  Filled 2022-12-06: qty 4

## 2022-12-06 NOTE — MAU Note (Signed)
.  Deborah Lawson is a 32 y.o. at [redacted]w[redacted]d here in MAU reporting: N/V for 2 days ago but today started having diarrhea, along with stomach ache and chills. Diarrhea x1. Denies N/V today. Denies VB or LOF. +FM. Reports taking tylenol at 1700  Onset of complaint: 2 days  Pain score: 9 - abdomen  Vitals:   12/06/22 2106  BP: 124/76  Pulse: 96  Resp: 18  Temp: 97.9 F (36.6 C)  SpO2: 100%     FHT:132 Lab orders placed from triage:  UA

## 2022-12-07 DIAGNOSIS — Z3A33 33 weeks gestation of pregnancy: Secondary | ICD-10-CM

## 2022-12-07 DIAGNOSIS — A084 Viral intestinal infection, unspecified: Secondary | ICD-10-CM | POA: Diagnosis not present

## 2022-12-07 MED ORDER — ONDANSETRON HCL 4 MG PO TABS: 4.0000 mg | ORAL_TABLET | Freq: Four times a day (QID) | ORAL | 0 refills | Status: DC

## 2022-12-07 NOTE — MAU Provider Note (Signed)
History     CSN: [id number]  Arrival date and time: 12/06/22 2048   Event Date/Time   First Provider Initiated Contact with Patient 12/06/22 2154      Chief Complaint  Patient presents with   Abdominal Pain   Diarrhea   HPI Deborah Lawson is a 32 y.o. [id number] at [redacted]w[redacted]d who presents today with complaints of vomiting and diarrhea. Reports children at home recently had similar symptoms. She reports onset of symptoms 2 days ago. She reports NBNB emesis, once a couple of days ago. Has been able to eat bread, white beans today. Drank 3-4 bottles of water total today. She denies any new foods or recent travel. Has watery diarrhea. Pt reports being told she had gallstones in past, wondering if this is related to current presentation. No VB, LOF, ctx. Has good FM.  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   Vitamin D deficiency 11/11/2020    Past Surgical History:  Procedure Laterality Date   CESAREAN SECTION N/A 10/26/2019   Procedure: CESAREAN SECTION;  Surgeon: Reva Bores, MD;  Location: [facility];  Service: Obstetrics;  Laterality: N/A;   MANDIBLE SURGERY      Family History  Problem Relation Age of Onset   Hypertension Father     Social History   Tobacco Use   Smoking status: Never    Passive exposure: Never   Smokeless tobacco: Never  Vaping Use   Vaping status: Never Used  Substance Use Topics   Alcohol use: Never   Drug use: Never    Allergies:  Allergies  Allergen Reactions   Pork-Derived Products     Religious    No medications prior to admission.    ROS reviewed and pertinent positives and negatives as documented in HPI.  Physical Exam   Blood pressure (!) 107/52, pulse 98, temperature 97.9 F (36.6  C), temperature source Oral, resp. rate 18, height 5\' 5"  (1.651 m), weight 91.7 kg, last menstrual period 04/18/2022, SpO2 100%.  Physical Exam Constitutional:      General: She is not in acute distress.    Appearance: Normal appearance.  HENT:     Head: Normocephalic and atraumatic.  Cardiovascular:     Rate and Rhythm: Normal rate and regular rhythm.     Heart sounds: Normal heart sounds.  Pulmonary:     Effort: Pulmonary effort is normal. No respiratory distress.     Breath sounds: Normal breath sounds.  Abdominal:     General: There is no distension.     Palpations: Abdomen is soft.     Tenderness: There is no abdominal tenderness. There is no right CVA tenderness, left CVA tenderness, guarding or rebound. Negative signs include Murphy's sign and McBurney's sign.  Musculoskeletal:        General: Normal range of motion.  Skin:    General: Skin is warm and dry.     Findings: No rash.  Neurological:     General: No focal deficit present.     Mental Status: She is alert and oriented to person, place, and time.   EFM: 125/mod/+a/-d  MAU Course  Procedures  MDM 32 y.o. [id number] at [redacted]w[redacted]d who presents today with complaint of vomiting/diarrhea x 2 days. Has positive sick contacts with  similar sxs. Her vital signs are stable. Exam is unremarkable. Labs are unremarkable. RUQ U/S without acute pathology. Given IV fluids, Zofran with resolution of symptoms. Suspect viral gastroenteritis especially in setting of family w similar sxs. Encourage PO hydration, return precautions discussed. Stable for d/c.  Assessment and Plan  Viral gastroenteritis - Plan: Discharge patient  Abdominal pain affecting pregnancy - Plan: Discharge patient  Patient discussed with Dr. Rulon Sera, MD OB Fellow, Faculty Practice [facility], Center for [facility]  12/07/2022, 4:52 AM

## 2022-12-13 ENCOUNTER — Other Ambulatory Visit: Payer: Self-pay

## 2022-12-13 ENCOUNTER — Ambulatory Visit (INDEPENDENT_AMBULATORY_CARE_PROVIDER_SITE_OTHER): Payer: Medicaid Other | Admitting: Family Medicine

## 2022-12-13 VITALS — BP 119/82 | HR 101 | Temp 98.0°F | Wt 201.5 lb

## 2022-12-13 DIAGNOSIS — O0993 Supervision of high risk pregnancy, unspecified, third trimester: Secondary | ICD-10-CM

## 2022-12-13 DIAGNOSIS — Z603 Acculturation difficulty: Secondary | ICD-10-CM

## 2022-12-13 DIAGNOSIS — Z758 Other problems related to medical facilities and other health care: Secondary | ICD-10-CM

## 2022-12-13 DIAGNOSIS — O34219 Maternal care for unspecified type scar from previous cesarean delivery: Secondary | ICD-10-CM

## 2022-12-13 DIAGNOSIS — O0990 Supervision of high risk pregnancy, unspecified, unspecified trimester: Secondary | ICD-10-CM

## 2022-12-13 DIAGNOSIS — Z3A34 34 weeks gestation of pregnancy: Secondary | ICD-10-CM

## 2022-12-13 MED ORDER — SENNA 8.6 MG PO TABS: 1.0000 | ORAL_TABLET | Freq: Every day | ORAL | 0 refills | Status: DC

## 2022-12-13 NOTE — Progress Notes (Unsigned)
Name: Deborah Lawson  MRN/ DOB: [id number], 09/03/1990    Age/ Sex: 32 y.o., female    PCP: Philip Aspen, Limmie Patricia, MD   Reason for Endocrinology Evaluation: Pituitary cyst     Date of Initial Endocrinology Evaluation: 08/05/2020    HPI: Deborah Lawson is a 32 y.o. female with unremarkable past medical history . The patient presented for initial endocrinology clinic visit on 08/05/2020 for consultative assistance with her pituitary cyst.   During evaluation of left sided facial pain through neurology she was found to have a 3 mm pituitary cyst during MRI in 06/2020 Pituitary work-up has been normal 07/2020   Pt noted atypical left facial pain and migraine headaches since having the nexplanon . Historically she has had occasional headaches but these became worse associated with left visual changes .  Her headaches have improved since removing the Nexplanon 5/11th 2022  She is S/P C-section in 10/2019 ,this was her 3rd pregnancy.     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  She is currently at 34.2 weeks of gestation  She had a recent admission for viral gastritis She follows with neurology for atypical headache/facial pain, thought to be cervicogenic, MRI shows no structural abnormality   Weight stable over the past 3 months  Headaches are stable , takes tylenol every 6 hrs No change in visual  Has changed to bowel movement  Has occasional nausea  Denies LE edema  Has occasional palpitations  Has occasional back pain       HISTORY:  Past Medical History:  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 She has never been exposed to tobacco smoke. She has never used smokeless tobacco. She reports that she does not drink alcohol and does not use drugs. Family History: family history includes Hypertension in her father.   HOME MEDICATIONS: Allergies as of 12/14/2022       Reactions   Pork-derived Products    Religious        Medication List        Accurate as of December 14, 2022  8:23 AM. If you have any questions, ask your nurse or doctor.          acetaminophen 325 MG tablet Commonly known as: TYLENOL Take 650 mg by mouth every 6 (six) hours as needed.   Blood Pressure Kit Devi 1 each by Does not apply route as needed.   cyclobenzaprine 10 MG tablet Commonly known as: FLEXERIL Take 1 tablet (10 mg total) by mouth every 8 (eight) hours as needed for muscle spasms.   ondansetron 4 MG tablet Commonly known as: ZOFRAN Take 1 tablet (4 mg total) by mouth every 6 (six) hours.   Prenatal Plus Vitamin/Mineral 27-1 MG Tabs Take 1 tablet by mouth daily.   senna 8.6 MG Tabs tablet  Commonly known as: SENOKOT Take 1 tablet (8.6 mg total) by mouth daily.          REVIEW OF SYSTEMS: A comprehensive ROS was conducted with the patient and is negative except as per HPI    OBJECTIVE:  VS: BP 110/74 (BP Location: Left Arm, Patient Position: Sitting, Cuff Size: Large)   Pulse 74   Ht 5\' 5"  (1.651 m)   Wt 202 lb (91.6 kg)   LMP 04/18/2022 (Exact Date)   SpO2 99%   BMI 33.61 kg/m    Wt Readings from Last 3 Encounters:  12/14/22 202 lb (91.6 kg)  12/13/22 201 lb 8 oz (91.4 kg)  12/06/22 202 lb 1.6 oz (91.7 kg)     EXAM: General: Pt appears well and is in NAD  Eyes: External  eye exam normal without stare, lid lag or exophthalmos.  EOM intact.   Neck: General: Supple without adenopathy. Thyroid: Thyroid size normal.  No goiter or nodules appreciated.  Lungs: Clear with good BS bilat with no rales, rhonchi, or wheezes  Heart: Auscultation: RRR.  Abdomen: Normoactive bowel sounds, soft, nontender, without masses or organomegaly palpable  Extremities:  BL LE: No pretibial edema normal ROM and strength.  Mental Status: Judgment, insight: Intact Orientation: Oriented to time, place, and person Mood and affect: No depression, anxiety, or agitation     DATA REVIEWED:   Latest Reference Range & Units 12/14/22 08:53  Sodium 135 - 145 mEq/L 135  Potassium 3.5 - 5.1 mEq/L 3.9  Chloride 96 - 112 mEq/L 103  CO2 19 - 32 mEq/L 23  Glucose 70 - 99 mg/dL 332 (H)  BUN 6 - 23 mg/dL 7  Creatinine 9.51 - 8.84 mg/dL 1.66  Calcium 8.4 - 06.3 mg/dL 9.0  GFR >01.60 mL/min 129.50    Latest Reference Range & Units 12/14/22 08:53  TSH 0.35 - 5.50 uIU/mL 2.50  T4,Free(Direct) 0.60 - 1.60 ng/dL 1.09      MRI Brain 06/05/5571  Dedicated pituitary protocol imaging was performed, including dynamic post-contrast imaging. 2-3 mm T2 hyperintense and transiently hypoenhancing focus within the left inferior aspect of the pituitary gland (for instance as seen on series 11, image 6) (series 17, image 5)(series 20, image 6). This is compatible with the provided history of pituitary microadenoma. There is no cavernous sinus invasion. No suprasellar extension. The pituitary stalk is midline and is not abnormally thickened.   Cerebral volume is normal.   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  IMPRESSION: 2-3 mm T2 hyperintense and transiently hypoenhancing lesion within the left inferior aspect of the pituitary gland, as described and compatible with the provided history of pituitary microdenoma.   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   Otherwise unremarkable MRI appearance of the brain.   ASSESSMENT/PLAN/RECOMMENDATIONS:   Pituitary microadenoma :  - This was an incidental finding on MRI for evaluation of headaches.  - Repeat pituitary  imaging was stable 06/2021 - No clinical concern for hypo or hyperpituitarism  -Historically biochemical testing has been normal, limited biochemical testing today as the patient is in third trimester of pregnancy -BMP and TFTs are normal   F/Y in 1 year  Signed electronically by: Lyndle Herrlich, MD  [facility] Endocrinology  [facility] [address] [place] [place], [place] [address] Phone: [phone] FAX: [phone]   CC: Philip Aspen, Limmie Patricia, MD [address] [place] [address] Phone: [phone] Fax: [phone]  Return to Endocrinology clinic as below: Future Appointments  Date Time Provider Department Center  12/14/2022  8:30 AM Osaze Hubbert, Konrad Dolores, MD [facility] None  12/28/2022  9:00 AM Alfonse Spruce, MD [facility] None  12/28/2022 10:55 AM Milas Hock, MD [facility]  02/24/2023 11:10 AM Drema Dallas, DO [facility] None

## 2022-12-13 NOTE — Progress Notes (Signed)
   PRENATAL VISIT NOTE  Subjective:  Deborah Lawson is a 32 y.o. [id number] at [redacted]w[redacted]d being seen today for ongoing prenatal care.  She is currently monitored for the following issues for this low-risk pregnancy and has Language barrier; History of gestational diabetes in prior pregnancy, currently pregnant; History of gestational hypertension; White matter abnormality on MRI of brain; Supervision of high risk pregnancy, antepartum; History of cesarean delivery, currently pregnant; and Female circumcision on their problem list.  Patient reports  right sided abdominal pain with anorexia. Seen for same in MAU and r/o gall stones.  No elevated WBC. Has some associated nausea. Reports pain is worse with lying down. Has a few contractions, not the same pain   . Vag. Bleeding: None.  Movement: Present. Denies leaking of fluid.   The following portions of the patient's history were reviewed and updated as appropriate: allergies, current medications, past family history, past medical history, past social history, past surgical history and problem list.   Objective:   Vitals:   12/13/22 0949  BP: 119/82  Pulse: (!) 101  Temp: 98 F (36.7 C)  Weight: 201 lb 8 oz (91.4 kg)    Fetal Status: Fetal Heart Rate (bpm): 150   Movement: Present     General:  Alert, oriented and cooperative. Patient is in no acute distress.  Skin: Skin is warm and dry. No rash noted.   Cardiovascular: Normal heart rate noted  Respiratory: Normal respiratory effort, no problems with respiration noted  Abdomen: Soft, gravid, appropriate for gestational age. No rebound. Some tenderness on right, no fundal tenderness  Pain/Pressure: Present     Pelvic: Cervical exam deferred        Extremities: Normal range of motion.  Edema: None  Mental Status: Normal mood and affect. Normal behavior. Normal judgment and thought content.   Assessment and Plan:  Pregnancy: [id number] at [redacted]w[redacted]d 1. History of cesarean delivery, currently  pregnant Desires TOLAC, consent previously signed  2. Language barrier Arabic interpreter: in person used  3. Supervision of high risk pregnancy, antepartum Continue routine prenatal care.  4. [redacted] weeks gestation of pregnancy  5. Abdominal pain Reports some "shivering" resolved without treatment. Right sided, not entirely c/w appy, but if worsens, to hospital to rule this out.  Preterm labor symptoms and general obstetric precautions including but not limited to vaginal bleeding, contractions, leaking of fluid and fetal movement were reviewed in detail with the patient. Please refer to After Visit Summary for other counseling recommendations.   Return in 2 weeks (on 12/27/2022).  Future Appointments  Date Time Provider Department Center  12/14/2022  8:30 AM Shamleffer, Konrad Dolores, MD [facility] None  12/28/2022  9:00 AM Alfonse Spruce, MD [facility] None  12/28/2022 10:55 AM Milas Hock, MD [facility]  02/24/2023 11:10 AM Drema Dallas, DO [facility] None    Reva Bores, MD

## 2022-12-14 ENCOUNTER — Ambulatory Visit: Payer: Medicaid Other | Admitting: Internal Medicine

## 2022-12-14 ENCOUNTER — Encounter: Payer: Self-pay | Admitting: Internal Medicine

## 2022-12-14 VITALS — BP 110/74 | HR 74 | Ht 65.0 in | Wt 202.0 lb

## 2022-12-14 DIAGNOSIS — E237 Disorder of pituitary gland, unspecified: Secondary | ICD-10-CM | POA: Diagnosis not present

## 2022-12-14 LAB — TSH: TSH: 2.5 u[IU]/mL (ref 0.35–5.50)

## 2022-12-14 LAB — BASIC METABOLIC PANEL WITH GFR
BUN: 7 mg/dL (ref 6–23)
CO2: 23 meq/L (ref 19–32)
Calcium: 9 mg/dL (ref 8.4–10.5)
Chloride: 103 meq/L (ref 96–112)
Creatinine, Ser: 0.44 mg/dL (ref 0.40–1.20)
GFR: 129.5 mL/min
Glucose, Bld: 147 mg/dL — ABNORMAL HIGH (ref 70–99)
Potassium: 3.9 meq/L (ref 3.5–5.1)
Sodium: 135 meq/L (ref 135–145)

## 2022-12-14 LAB — T4, FREE: Free T4: 0.6 ng/dL (ref 0.60–1.60)

## 2022-12-28 ENCOUNTER — Encounter: Payer: Self-pay | Admitting: Allergy & Immunology

## 2022-12-28 ENCOUNTER — Other Ambulatory Visit (HOSPITAL_COMMUNITY)
Admission: RE | Admit: 2022-12-28 | Discharge: 2022-12-28 | Disposition: A | Discharge status: Home / Self Care | Payer: Medicaid Other | Source: Ambulatory Visit | Attending: Obstetrics and Gynecology | Admitting: Obstetrics and Gynecology

## 2022-12-28 ENCOUNTER — Ambulatory Visit: Payer: Medicaid Other | Admitting: Allergy & Immunology

## 2022-12-28 ENCOUNTER — Other Ambulatory Visit: Payer: Self-pay

## 2022-12-28 ENCOUNTER — Encounter: Payer: Medicaid Other | Admitting: Obstetrics and Gynecology

## 2022-12-28 ENCOUNTER — Ambulatory Visit: Payer: Medicaid Other | Admitting: Certified Nurse Midwife

## 2022-12-28 VITALS — BP 130/60 | HR 98 | Temp 98.5°F | Resp 20 | Ht 64.57 in | Wt 205.2 lb

## 2022-12-28 VITALS — BP 128/86 | HR 116 | Wt 206.2 lb

## 2022-12-28 DIAGNOSIS — O0993 Supervision of high risk pregnancy, unspecified, third trimester: Secondary | ICD-10-CM | POA: Insufficient documentation

## 2022-12-28 DIAGNOSIS — B999 Unspecified infectious disease: Secondary | ICD-10-CM | POA: Diagnosis not present

## 2022-12-28 DIAGNOSIS — Z1332 Encounter for screening for maternal depression: Secondary | ICD-10-CM | POA: Diagnosis not present

## 2022-12-28 DIAGNOSIS — Z2911 Encounter for prophylactic immunotherapy for respiratory syncytial virus (RSV): Secondary | ICD-10-CM | POA: Diagnosis not present

## 2022-12-28 DIAGNOSIS — J310 Chronic rhinitis: Secondary | ICD-10-CM | POA: Diagnosis not present

## 2022-12-28 DIAGNOSIS — Z3A36 36 weeks gestation of pregnancy: Secondary | ICD-10-CM

## 2022-12-28 DIAGNOSIS — Z98891 History of uterine scar from previous surgery: Secondary | ICD-10-CM

## 2022-12-28 MED ORDER — BLOOD PRESSURE KIT DEVI: 1.0000 | 0 refills | Status: DC | PRN

## 2022-12-28 MED ORDER — AZELASTINE HCL 0.1 % NA SOLN: 2.0000 | Freq: Two times a day (BID) | NASAL | 5 refills | Status: DC

## 2022-12-28 NOTE — Progress Notes (Signed)
NEW PATIENT  Date of Service/Encounter:  12/28/22  Consult requested by: Philip Aspen, Limmie Patricia, MD   Assessment:   Chronic rhinitis - we we will have more options for treatment after her pregnancy such as allergen immunotherapy  Recurrent infections - mostly sinopulmonary in nature  Plan/Recommendations:   1. Chronic rhinitis - We are going to some blood work to look for environmental allergies. - Add on Astelin two sprays per nostril daily to see if this can work with your nasal steroid to help with your symptoms.  - We will have more options once the baby is born, such as allergy shots. - We will write a note to the apartment complex AFTER we have the blood work.   2. Recurrent infections - We will obtain some screening labs to evaluate your immune system.  - Labs to evaluate the quantitative [facility]) aspects of your immune system: IgG/IgA/IgM, CBC with differential - Labs to evaluate the qualitative (HOW WELL THEY WORK) aspects of your immune system: CH50, Pneumococcal titers, Tetanus titers, Diphtheria titers - We may consider immunizations with Pneumovax and Tdap to challenge your immune system, and then obtain repeat titers in 4-6 weeks.   3. Return in about 4 months (around 04/30/2023). You can have the follow up appointment with Dr. Dellis Anes or a Nurse Practicioner (our Nurse Practitioners are excellent and always have Physician oversight!).   This note in its entirety was forwarded to the Provider who requested this consultation.  Subjective:   Deborah Lawson is a 32 y.o. 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 We did have an interpreter.   Discussed the use of AI scribe software for clinical note transcription with the patient and/or guardian, who gave verbal consent to proceed.  Deborah Lawson was referred by Philip Aspen, Limmie Patricia, MD.     [name] is a 32 y.o. female presenting for an evaluation of chronic rhinitis and recurrent sinusitis .  The patient, a pregnant individual with a history of recurrent sinus infections, presents today for allergy testing. She reports a history of sinus infections on both the left and right side, with the most recent infection occurring on the right side. The patient was previously treated with antibiotics, which resolved the infection. However, due to her current pregnancy, she was unable to take antibiotics for the recent infection and was instead given a nasal spray, which was ineffective.  Review of the notes show that this was Flonase.  The patient also reports symptoms suggestive of environmental allergies, including chest tightness and sneezing when in close proximity to carpeting. She has tried antihistamines in the past, which were effective in relieving symptoms but caused excessive sleepiness, making it difficult for her to care for her children.  She sees Aquilla Hacker with ENT.  She had a sinus  CT in June 2024 that was largely normal.  Results shown below.  IMPRESSION: 1. Small volume frothy secretions in the left sphenoid sinus. No sinus outflow obstruction or mucosal thickening. 2. Right more than left concha bullosa. 3. Uncomplicated right frontal sinus osteoma.  The patient has lived in the current area for six years and had  similar problems prior to moving. She was previously treated with Zyrtec, which also caused sleepiness. The patient has no history of other infections such as pneumonia, skin infections, or bone infections. She has a family history of allergies and frequent sinus infections.  Immunodeficiency screen: Eight or more ear infections in one year: NO Two or more serious sinus infections in one year: YES Two or more bouts of pneumonia in one year: NO Two or more deep-seated infections, or infections in unusual areas: NO Recurrent deep skin or organ abscesses: NO Need for intravenous antibiotic therapy to clear infection: NO Infections with unusual or opportunistic organisms: NO Family history of primary immunodeficiency: NO Recurrent fevers: NO  Physical signs screen: Poor growth, failure to thrive: NO Absent lymph nodes or tonsils: NO Skin lesions: telangiectasias, petechiae, dermatomyositis, lupus-like rash: NO Ataxia (with ataxia-telangiectasia): NO Oral thrush after one year of age: NO Oral ulcers: NO  The patient is originally from [place] and has been in the current location since 2018. She has three children, all of whom are in good health. The patient is currently pregnant and due to give birth in the near future. She expresses concern about potential allergens in her home, particularly the carpeting, and its impact on her health and the health of her family.     Otherwise, there is no history of other atopic diseases, including drug allergies, stinging insect allergies, or contact dermatitis. There is no significant infectious history. Vaccinations are up to date.    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 If you have any questions, ask your nurse or doctor.          acetaminophen 325 MG tablet Commonly known as: TYLENOL Take 650 mg by mouth every 6 (six) hours as needed.   Blood Pressure Kit Devi 1 each by Does not apply route as needed.   cyclobenzaprine 10 MG tablet Commonly known as: FLEXERIL Take 1 tablet (10 mg total) by mouth every 8 (eight) hours as needed for muscle spasms.   fluticasone 50 MCG/ACT nasal spray Commonly known as: FLONASE Place into both nostrils.   metoCLOPramide 10 MG tablet Commonly known as: REGLAN Take 10 mg by mouth 4 (four) times daily.   ondansetron 4 MG tablet Commonly known as: ZOFRAN Take 1 tablet (4 mg total) by mouth every 6 (six) hours.   Prenatal Plus Vitamin/Mineral 27-1 MG Tabs Take 1 tablet by mouth daily.   senna 8.6 MG Tabs tablet Commonly known as: SENOKOT Take 1 tablet (  8.6 mg total) by mouth daily.        Birth History: non-contributory  Developmental History: non-contributory  Past Surgical History: Past Surgical History:  Procedure Laterality Date   CESAREAN SECTION N/A 10/26/2019   Procedure: CESAREAN SECTION;  Surgeon: Reva Bores, MD;  Location: [facility];  Service: Obstetrics;  Laterality: N/A;   MANDIBLE SURGERY       Family History: Family History  Problem Relation Age of Onset   Allergic rhinitis Mother    Hypertension Father      Social History: Peggi lives at home with her family, a 82-year-old, 31-year-old, and 30 year old.  They live in an apartment that is 30 years old.  There is carpeting throughout the apartment.  They have gas heating and central cooling.  There are no animals inside or outside of the  home.  There are no dust mite covers on the bedding.  There is no tobacco exposure.  She is not exposed to fumes, chemicals, or dust.  She does not have a HEPA filter.  She has never been a smoker.   Review of systems otherwise negative other than that mentioned in the HPI.    Objective:   Blood pressure 130/60, pulse 98, temperature 98.5 F (36.9 C), resp. rate 20, height 5' 4.57" (1.64 m), weight 205 lb 3.2 oz (93.1 kg), last menstrual period 04/18/2022, SpO2 99%. Body mass index is 34.61 kg/m.     Physical Exam Vitals reviewed.  Constitutional:      Appearance: She is well-developed.     Comments: Moving air well in all lung fields.  No increased work of breathing.  HENT:     Head: Normocephalic and atraumatic.     Right Ear: Tympanic membrane, ear canal and external ear normal. No drainage, swelling or tenderness. Tympanic membrane is not injected, scarred, erythematous, retracted or bulging.     Left Ear: Tympanic membrane, ear canal and external ear normal. No drainage, swelling or tenderness. Tympanic membrane is not injected, scarred, erythematous, retracted or bulging.     Nose: Rhinorrhea present. No nasal deformity, septal deviation or mucosal edema.     Right Turbinates: Enlarged, swollen and pale.     Left Turbinates: Enlarged, swollen and pale.     Right Sinus: No maxillary sinus tenderness or frontal sinus tenderness.     Left Sinus: No maxillary sinus tenderness or frontal sinus tenderness.     Comments: No polyps.  Copious rhinorrhea.    Mouth/Throat:     Mouth: Mucous membranes are not pale and not dry.     Pharynx: Uvula midline.  Eyes:     General:        Right eye: No discharge.        Left eye: No discharge.     Conjunctiva/sclera: Conjunctivae normal.     Right eye: Right conjunctiva is not injected. No chemosis.    Left eye: Left conjunctiva is not injected. No chemosis.    Pupils: Pupils are equal, round, and reactive to light.  Cardiovascular:      Rate and Rhythm: Normal rate and regular rhythm.     Heart sounds: Normal heart sounds.  Pulmonary:     Effort: Pulmonary effort is normal. No tachypnea, accessory muscle usage or respiratory distress.     Breath sounds: Normal breath sounds. No wheezing, rhonchi or rales.  Chest:     Chest wall: No tenderness.  Abdominal:     Tenderness: There is no abdominal tenderness. There is  no guarding or rebound.  Lymphadenopathy:     Head:     Right side of head: No submandibular, tonsillar or occipital adenopathy.     Left side of head: No submandibular, tonsillar or occipital adenopathy.     Cervical: No cervical adenopathy.  Skin:    General: Skin is warm.     Capillary Refill: Capillary refill takes less than 2 seconds.     Coloration: Skin is not pale.     Findings: No abrasion, erythema, petechiae or rash. Rash is not papular, urticarial or vesicular.     Comments: No eczematous or urticarial lesions noted.  Neurological:     Mental Status: She is alert.  Psychiatric:        Behavior: Behavior is cooperative.      Diagnostic studies: labs sent instead since she was pregnant       Malachi Bonds, MD [facility]

## 2022-12-28 NOTE — Patient Instructions (Addendum)
1. Chronic rhinitis - We are going to some blood work to look for environmental allergies. - Add on Astelin two sprays per nostril daily to see if this can work with your nasal steroid to help with your symptoms.  - We will have more options once the baby is born, such as allergy shots. - We will write a note to the apartment complex AFTER we have the blood work.   2. Recurrent infections - We will obtain some screening labs to evaluate your immune system.  - Labs to evaluate the quantitative [facility]) aspects of your immune system: IgG/IgA/IgM, CBC with differential - Labs to evaluate the qualitative (HOW WELL THEY WORK) aspects of your immune system: CH50, Pneumococcal titers, Tetanus titers, Diphtheria titers - We may consider immunizations with Pneumovax and Tdap to challenge your immune system, and then obtain repeat titers in 4-6 weeks.   3. Return in about 4 months (around 04/30/2023). You can have the follow up appointment with Dr. Dellis Anes or a Nurse Practicioner (our Nurse Practitioners are excellent and always have Physician oversight!).    Please inform us of any Emergency Department visits, hospitalizations, or changes in symptoms. Call us before going to the ED for breathing or allergy symptoms since we might be able to fit you in for a sick visit. Feel free to contact us anytime with any questions, problems, or concerns.  It was a pleasure to meet you today!  Websites that have reliable patient information: 1. American Academy of Asthma, Allergy, and Immunology: www.aaaai.org 2. Food Allergy Research and Education (FARE): foodallergy.org 3. Mothers of Asthmatics: http://www.asthmacommunitynetwork.org 4. American College of Allergy, Asthma, and Immunology: www.acaai.org   COVID-19 Vaccine Information can be found at: PodExchange.nl For questions related to vaccine distribution or appointments, please email  [email or web address] or call [phone].     "Like" [place] on Facebook and Instagram for our latest updates!      A healthy democracy works best when [employer] participate! Make sure you are registered to vote! If you have moved or changed any of your contact information, you will need to get this updated before voting! Scan the QR codes below to learn more!

## 2022-12-30 LAB — GC/CHLAMYDIA PROBE AMP (~~LOC~~) NOT AT ARMC
Chlamydia: NEGATIVE
Comment: NEGATIVE
Comment: NORMAL
Neisseria Gonorrhea: NEGATIVE

## 2022-12-31 NOTE — Progress Notes (Signed)
   PRENATAL VISIT NOTE  Subjective:  Deborah Lawson is a 32 y.o. [id number] at [redacted]w[redacted]d being seen today for ongoing prenatal care.  She is currently monitored for the following issues for this high-risk pregnancy and has Language barrier; History of gestational diabetes in prior pregnancy, currently pregnant; History of gestational hypertension; White matter abnormality on MRI of brain; Supervision of high risk pregnancy, antepartum; History of cesarean delivery, currently pregnant; and Female circumcision on their problem list.  Patient reports  intermittent pelvic discomfort  .  Contractions: Irritability. Vag. Bleeding: None.  Movement: Present. Denies leaking of fluid.   The following portions of the patient's history were reviewed and updated as appropriate: allergies, current medications, past family history, past medical history, past social history, past surgical history and problem list.   Objective:   Vitals:   12/28/22 1337  BP: 128/86  Pulse: (!) 116  Weight: 206 lb 3.2 oz (93.5 kg)    Fetal Status: Fetal Heart Rate (bpm): 137 Fundal Height: 35 cm Movement: Present     General:  Alert, oriented and cooperative. Patient is in no acute distress.  Skin: Skin is warm and dry. No rash noted.   Cardiovascular: Normal heart rate noted  Respiratory: Normal respiratory effort, no problems with respiration noted  Abdomen: Soft, gravid, appropriate for gestational age.  Pain/Pressure: Absent     Pelvic: Cervical exam deferred        Extremities: Normal range of motion.  Edema: None  Mental Status: Normal mood and affect. Normal behavior. Normal judgment and thought content.   Assessment and Plan:  Pregnancy: [id number] at [redacted]w[redacted]d 1. Supervision of high risk pregnancy in third trimester - Patient doing well  - Reports vigorous and frequent fetal movement.  - Culture, beta strep (group b only) - GC/Chlamydia probe amp ([place])not at [facility] - Respiratory syncytial virus vaccine,  preF, subunit, bivalent,(Abrysvo)  2. [redacted] weeks gestation of pregnancy - GBS and GC collected today.   3. Previous cesarean section - Patient has concerns for fetal positioning. She reports that if baby is breech she desires a repeat C/S if vertex she would like to [facility].  - Vertex via [place] today. Also reviewed that fetal position would be reassessed at time of delivery, and patient reassured.   Term labor symptoms and general obstetric precautions including but not limited to vaginal bleeding, contractions, leaking of fluid and fetal movement were reviewed in detail with the patient. Please refer to After Visit Summary for other counseling recommendations.   Return in about 1 week (around 01/04/2023) for LOB.  Future Appointments  Date Time Provider Department Center  01/10/2023  1:15 PM Adam Phenix, MD [facility]  02/24/2023 11:10 AM Drema Dallas, DO [facility] None  04/26/2023 10:30 AM Alfonse Spruce, MD [facility] None  12/14/2023  8:30 AM Shamleffer, Konrad Dolores, MD [facility] None    Sonika Levins (Danella Deis) Suzie Portela, MSN, CNM  Center for [facility]  12/31/2022 10:38 AM

## 2023-01-01 LAB — CBC WITH DIFFERENTIAL/PLATELET
Basophils Absolute: 0 10*3/uL (ref 0.0–0.2)
Basos: 0 %
EOS (ABSOLUTE): 0.1 10*3/uL (ref 0.0–0.4)
Eos: 2 %
Hematocrit: 36 % (ref 34.0–46.6)
Hemoglobin: 11.7 g/dL (ref 11.1–15.9)
Immature Grans (Abs): 0.1 10*3/uL (ref 0.0–0.1)
Immature Granulocytes: 1 %
Lymphocytes Absolute: 2.4 10*3/uL (ref 0.7–3.1)
Lymphs: 33 %
MCH: 27 pg (ref 26.6–33.0)
MCHC: 32.5 g/dL (ref 31.5–35.7)
MCV: 83 fL (ref 79–97)
Monocytes Absolute: 0.6 10*3/uL (ref 0.1–0.9)
Monocytes: 8 %
Neutrophils Absolute: 4.1 10*3/uL (ref 1.4–7.0)
Neutrophils: 56 %
Platelets: 336 10*3/uL (ref 150–450)
RBC: 4.33 x10E6/uL (ref 3.77–5.28)
RDW: 13.6 % (ref 11.7–15.4)
WBC: 7.3 10*3/uL (ref 3.4–10.8)

## 2023-01-01 LAB — STREP PNEUMONIAE 23 SEROTYPES IGG
Pneumo Ab Type 19 (19F)*: 0.2 ug/mL — ABNORMAL LOW (ref >1.3)
Pneumo Ab Type 20*: 1.8 ug/mL (ref >1.3)
Pneumo Ab Type 23 (23F)*: 0.2 ug/mL — ABNORMAL LOW (ref >1.3)
Pneumo Ab Type 57 (19A)*: 0.8 ug/mL — ABNORMAL LOW (ref >1.3)
Pneumo Ab Type 70 (33F)*: 0.1 ug/mL — ABNORMAL LOW (ref >1.3)
Pneumo Ab Type 8*: 1.3 ug/mL — ABNORMAL LOW (ref >1.3)

## 2023-01-01 LAB — DIPHTHERIA / TETANUS ANTIBODY PANEL
Diphtheria Ab: 0.32 [IU]/mL (ref <0.10)
Tetanus Ab, IgG: >7 [IU]/mL (ref <0.10)

## 2023-01-01 LAB — COMPLEMENT, TOTAL: Compl, Total (CH50): >60 U/mL (ref >41)

## 2023-01-01 LAB — ALLERGENS W/COMP RFLX AREA 2
Alternaria Alternata IgE: <0.1 kU/L
Aspergillus Fumigatus IgE: <0.1 kU/L
Bermuda Grass IgE: <0.1 kU/L
Cedar, Mountain IgE: <0.1 kU/L
Cladosporium Herbarum IgE: <0.1 kU/L
Cockroach, German IgE: <0.1 kU/L
Common Silver Birch IgE: <0.1 kU/L
Cottonwood IgE: <0.1 kU/L
D Farinae IgE: <0.1 kU/L
D Pteronyssinus IgE: <0.1 kU/L
E001-IgE Cat Dander: <0.1 kU/L
E005-IgE Dog Dander: <0.1 kU/L
Elm, American IgE: <0.1 kU/L
IgE (Immunoglobulin E), Serum: 60 [IU]/mL (ref 6–495)
Johnson Grass IgE: <0.1 kU/L
Maple/Box Elder IgE: <0.1 kU/L
Mouse Urine IgE: <0.1 kU/L
Oak, White IgE: <0.1 kU/L
Pecan, Hickory IgE: <0.1 kU/L
Penicillium Chrysogen IgE: <0.1 kU/L
Pigweed, Rough IgE: <0.1 kU/L
Ragweed, Short IgE: <0.1 kU/L
Sheep Sorrel IgE Qn: <0.1 kU/L
Timothy Grass IgE: <0.1 kU/L
White Mulberry IgE: <0.1 kU/L

## 2023-01-01 LAB — ALLERGEN PROFILE, MOLD
Aureobasidi Pullulans IgE: <0.1 kU/L
Candida Albicans IgE: <0.1 kU/L
M009-IgE Fusarium proliferatum: <0.1 kU/L
M014-IgE Epicoccum purpur: <0.1 kU/L
Mucor Racemosus IgE: <0.1 kU/L
Phoma Betae IgE: <0.1 kU/L
Setomelanomma Rostrat: <0.1 kU/L
Stemphylium Herbarum IgE: <0.1 kU/L

## 2023-01-01 LAB — IGG, IGA, IGM
IgA/Immunoglobulin A, Serum: 149 mg/dL (ref 87–352)
IgG (Immunoglobin G), Serum: 1225 mg/dL (ref 586–1602)
IgM (Immunoglobulin M), Srm: 89 mg/dL (ref 26–217)

## 2023-01-02 LAB — CULTURE, BETA STREP (GROUP B ONLY): Strep Gp B Culture: NEGATIVE

## 2023-01-05 ENCOUNTER — Encounter: Payer: Medicaid Other | Admitting: Obstetrics & Gynecology

## 2023-01-07 ENCOUNTER — Encounter (HOSPITAL_COMMUNITY): Payer: Self-pay | Admitting: Obstetrics and Gynecology

## 2023-01-07 ENCOUNTER — Encounter: Payer: Self-pay | Admitting: Obstetrics & Gynecology

## 2023-01-07 ENCOUNTER — Other Ambulatory Visit: Payer: Self-pay

## 2023-01-07 ENCOUNTER — Inpatient Hospital Stay (HOSPITAL_COMMUNITY)
Admission: AD | Admit: 2023-01-07 | Discharge: 2023-01-08 | Disposition: A | Discharge status: Home / Self Care | Payer: Medicaid Other | Attending: Obstetrics and Gynecology | Admitting: Obstetrics and Gynecology

## 2023-01-07 DIAGNOSIS — W1839XA Other fall on same level, initial encounter: Secondary | ICD-10-CM | POA: Insufficient documentation

## 2023-01-07 DIAGNOSIS — M545 Low back pain, unspecified: Secondary | ICD-10-CM | POA: Insufficient documentation

## 2023-01-07 DIAGNOSIS — Z3A37 37 weeks gestation of pregnancy: Secondary | ICD-10-CM | POA: Insufficient documentation

## 2023-01-07 DIAGNOSIS — O09293 Supervision of pregnancy with other poor reproductive or obstetric history, third trimester: Secondary | ICD-10-CM | POA: Insufficient documentation

## 2023-01-07 DIAGNOSIS — O26893 Other specified pregnancy related conditions, third trimester: Secondary | ICD-10-CM | POA: Insufficient documentation

## 2023-01-07 DIAGNOSIS — Z3689 Encounter for other specified antenatal screening: Secondary | ICD-10-CM

## 2023-01-07 DIAGNOSIS — R109 Unspecified abdominal pain: Secondary | ICD-10-CM | POA: Insufficient documentation

## 2023-01-07 DIAGNOSIS — W19XXXA Unspecified fall, initial encounter: Secondary | ICD-10-CM

## 2023-01-07 MED ORDER — ACETAMINOPHEN 500 MG PO TABS
1000.0000 mg | ORAL_TABLET | Freq: Once | ORAL | Status: AC
  Administered 2023-01-08: 1000 mg via ORAL
  Filled 2023-01-07: qty 2

## 2023-01-07 MED ORDER — NIFEDIPINE 10 MG PO CAPS
10.0000 mg | ORAL_CAPSULE | ORAL | Status: DC | PRN
  Administered 2023-01-08 (×3): 10 mg via ORAL
  Filled 2023-01-07 (×4): qty 1

## 2023-01-07 NOTE — MAU Provider Note (Incomplete)
MAU Provider Note  Chief Complaint: Abdominal Pain, Back Pain, and Fall   Event Date/Time   First Provider Initiated Contact with Patient 01/07/23 2332      SUBJECTIVE HPI: Deborah Lawson is a 32 y.o. [id number] at [redacted]w[redacted]d by LMP who presents to maternity admissions reporting fall around 5 PM. Pregnancy c/b hx C/S for breech presentation. Receives [facility] with [facility].  Patient trips and fell onto her knees this evening. Did not hit her head or abdomen. Having some contractions/cramping and low back pain. She notes normal FM. Denies VB, LOF, urinary symptoms, sick symptoms.   HPI  Past Medical History:  Diagnosis Date  . Abnormal computed tomography of paranasal sinus 07/06/2021  . Chronic left-sided low back pain 08/04/2022  . Gestational diabetes   . Gestational hypertension   . Referred otalgia, left 08/06/2020  . 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 Vitamin D deficiency 11/11/2020   Past Surgical History:  Procedure Laterality Date  . CESAREAN SECTION N/A 10/26/2019   Procedure: CESAREAN SECTION;  Surgeon: Reva Bores, MD;  Location: [facility];  Service: Obstetrics;  Laterality: N/A;  . MANDIBLE SURGERY     Social History   Socioeconomic History  . Marital status: Married    Spouse name: Not on file  . Number of children: Not on file  . Years of education: Not on file  . Highest education level: GED or equivalent  Occupational History  . Not on file  Tobacco Use  . Smoking status: Never    Passive exposure: Never  . Smokeless tobacco: Never  Vaping Use  . Vaping status: Never Used  Substance and Sexual Activity  . Alcohol use: Never  . Drug use: Never  . Sexual activity: Yes    Birth control/protection: None    Comment: last ic 3 days ago  Other Topics Concern  . Not on file  Social History Narrative   Right handed   Social Determinants of Health   Financial Resource Strain: Low Risk  (03/16/2022)   Overall Financial Resource Strain (CARDIA)    . Difficulty of Paying Living Expenses: Not very hard  Food Insecurity: Low Risk  (08/19/2022)   [facility]   Hunger Vital Sign   . Worried About Programme researcher, broadcasting/film/video in the Last Year: Never true   . Ran Out of Food in the Last Year: Never true  Transportation Needs: Not on file (08/19/2022)  Physical Activity: Unknown (03/16/2022)   Exercise Vital Sign   . Days of Exercise per Week: 1 day   . Minutes of Exercise per Session: Patient declined  Stress: No Stress Concern Present (03/16/2022)   [employer] of Occupational Health - Occupational Stress Questionnaire   . Feeling of Stress : Only a little  Social Connections: Moderately Isolated (03/16/2022)   Social Connection and Isolation Panel [NHANES]   . Frequency of Communication with Friends and Family: More than three times a week   . Frequency of Social Gatherings with Friends and Family: Patient declined   . Attends Religious Services: Never   . Active Member of Clubs or Organizations: No   . Attends Banker Meetings: Not on file   . Marital Status: Married  Catering manager Violence: Not At Risk (03/17/2022)   Humiliation, Afraid, Rape, and Kick questionnaire   . Fear of Current or Ex-Partner: No   . Emotionally Abused: No   . Physically Abused: No   . Sexually Abused: No  No current facility-administered medications on file prior to encounter.   Current Outpatient Medications on File Prior to Encounter  Medication Sig Dispense Refill  . ondansetron (ZOFRAN) 4 MG tablet Take 1 tablet (4 mg total) by mouth every 6 (six) hours. 12 tablet 0  . Prenatal Vit-Fe Fumarate-FA (PRENATAL PLUS VITAMIN/MINERAL) 27-1 MG TABS Take 1 tablet by mouth daily. 30 tablet 11  . acetaminophen (TYLENOL) 325 MG tablet Take 650 mg by mouth every 6 (six) hours as needed.    Marland Kitchen azelastine (ASTELIN) 0.1 % nasal spray Place 2 sprays into both nostrils 2 (two) times daily. Use in each nostril as directed 30 mL 5  .  cyclobenzaprine (FLEXERIL) 10 MG tablet Take 1 tablet (10 mg total) by mouth every 8 (eight) hours as needed for muscle spasms. 20 tablet 0  . fluticasone (FLONASE) 50 MCG/ACT nasal spray Place into both nostrils.    . metoCLOPramide (REGLAN) 10 MG tablet Take 10 mg by mouth 4 (four) times daily. (Patient not taking: Reported on 12/28/2022)    . senna (SENOKOT) 8.6 MG TABS tablet Take 1 tablet (8.6 mg total) by mouth daily. (Patient not taking: Reported on 12/28/2022) 120 tablet 0  . [DISCONTINUED] NIFEdipine (PROCARDIA XL) 30 MG 24 hr tablet Take 1 tablet (30 mg total) by mouth daily. 30 tablet 1   Allergies  Allergen Reactions  . Pork-Derived Products     Religious    ROS:  Pertinent positives/negatives listed above.  I have reviewed patient's Past Medical Hx, Surgical Hx, Family Hx, Social Hx, medications and allergies.   Physical Exam  Patient Vitals for the past 24 hrs:  BP Temp Temp src Pulse Resp SpO2 Height Weight  01/07/23 2201 131/69 98 F (36.7 C) Oral 98 17 100 % 5\' 5"  (1.651 m) 95.6 kg   Constitutional: Well-developed, well-nourished female in no acute distress  Cardiovascular: normal rate Respiratory: normal effort GI: Abd soft, non-tender MS: Extremities nontender, no edema, normal ROM Neurologic: Alert and oriented x 4   FHT:  Baseline 130, moderate variability, accelerations present, no decelerations Contractions: irregular/UI  LAB RESULTS No results found for this or any previous visit (from the past 24 hour(s)).  O/Positive/-- (04/24 1118)  IMAGING No results found.  MAU Management/MDM: Orders Placed This Encounter  Procedures  . [place] MFM FETAL BPP WO NON STRESS    Meds ordered this encounter  Medications  . acetaminophen (TYLENOL) tablet 1,000 mg  . NIFEdipine (PROCARDIA) capsule 10 mg     Available prenatal records reviewed.  Patient presents with fall. ***  ASSESSMENT No diagnosis found.  PLAN Discharge home with strict return  precautions. Allergies as of 01/07/2023       Reactions   Pork-derived Products    Religious     Med Rec must be completed prior to using this [facility]***        Wylene Simmer, MD OB Fellow 01/07/2023  11:55 PM

## 2023-01-07 NOTE — MAU Provider Note (Signed)
MAU Provider Note  Chief Complaint: Abdominal Pain, Back Pain, and Fall   Event Date/Time   First Provider Initiated Contact with Patient 01/07/23 2332      SUBJECTIVE HPI: Deborah Lawson is a 32 y.o. [id number] at [redacted]w[redacted]d by LMP who presents to maternity admissions reporting fall around 5 PM. Pregnancy c/b hx C/S for breech presentation. Receives [facility] with [facility].  Patient trips and fell onto her knees this evening. Did not hit her head or abdomen. Having some contractions/cramping and low back pain. She notes normal FM. Denies VB, LOF, urinary symptoms, sick symptoms.   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   Vitamin D deficiency 11/11/2020   Past Surgical History:  Procedure Laterality Date   CESAREAN SECTION N/A 10/26/2019   Procedure: CESAREAN SECTION;  Surgeon: Reva Bores, MD;  Location: [facility];  Service: Obstetrics;  Laterality: N/A;   MANDIBLE SURGERY     Social History   Socioeconomic History   Marital status: Married    Spouse name: Not on file   Number of children: Not on file   Years of education: Not on file   Highest education level: GED or equivalent  Occupational History   Not on file  Tobacco Use   Smoking status: Never    Passive exposure: Never   Smokeless tobacco: Never  Vaping Use   Vaping status: Never Used  Substance and Sexual Activity   Alcohol use: Never   Drug use: Never   Sexual activity: Yes    Birth control/protection: None    Comment: last ic 3 days ago  Other Topics Concern   Not on file  Social History Narrative   Right handed   Social Determinants of Health   Financial Resource Strain: Low Risk  (03/16/2022)   Overall Financial Resource Strain (CARDIA)    Difficulty of  Paying Living Expenses: Not very hard  Food Insecurity: Low Risk  (08/19/2022)   [facility]   Hunger Vital Sign    Worried About Running Out of Food in the Last Year: Never true    Ran Out of Food in the Last Year: Never true  Transportation Needs: Not on file (08/19/2022)  Physical Activity: Unknown (03/16/2022)   Exercise Vital Sign    Days of Exercise per Week: 1 day    Minutes of Exercise per Session: Patient declined  Stress: No Stress Concern Present (03/16/2022)   [employer] of Occupational Health - Occupational Stress Questionnaire    Feeling of Stress : Only a little  Social Connections: Moderately Isolated (03/16/2022)   Social Connection and Isolation Panel [NHANES]    Frequency of Communication with Friends and Family: More than three times a week    Frequency of Social Gatherings with Friends and Family: Patient declined    Attends Religious Services: Never    Database administrator or Organizations: No    Attends Engineer, structural: Not on file    Marital Status: Married  Catering manager Violence: Not At Risk (03/17/2022)   Humiliation, Afraid, Rape, and Kick questionnaire    Fear of Current or Ex-Partner: No    Emotionally Abused: No    Physically Abused: No    Sexually Abused: No  No current facility-administered medications on file prior to encounter.   Current Outpatient Medications on File Prior to Encounter  Medication Sig Dispense Refill   ondansetron (ZOFRAN) 4 MG tablet Take 1 tablet (4 mg total) by mouth every 6 (six) hours. 12 tablet 0   Prenatal Vit-Fe Fumarate-FA (PRENATAL PLUS VITAMIN/MINERAL) 27-1 MG TABS Take 1 tablet by mouth daily. 30 tablet 11   acetaminophen (TYLENOL) 325 MG tablet Take 650 mg by mouth every 6 (six) hours as needed.     azelastine (ASTELIN) 0.1 % nasal spray Place 2 sprays into both nostrils 2 (two) times daily. Use in each nostril as directed 30 mL 5   cyclobenzaprine (FLEXERIL) 10 MG  tablet Take 1 tablet (10 mg total) by mouth every 8 (eight) hours as needed for muscle spasms. 20 tablet 0   fluticasone (FLONASE) 50 MCG/ACT nasal spray Place into both nostrils.     metoCLOPramide (REGLAN) 10 MG tablet Take 10 mg by mouth 4 (four) times daily. (Patient not taking: Reported on 12/28/2022)     senna (SENOKOT) 8.6 MG TABS tablet Take 1 tablet (8.6 mg total) by mouth daily. (Patient not taking: Reported on 12/28/2022) 120 tablet 0   [DISCONTINUED] NIFEdipine (PROCARDIA XL) 30 MG 24 hr tablet Take 1 tablet (30 mg total) by mouth daily. 30 tablet 1   Allergies  Allergen Reactions   Pork-Derived Products     Religious    ROS:  Pertinent positives/negatives listed above.  I have reviewed patient's Past Medical Hx, Surgical Hx, Family Hx, Social Hx, medications and allergies.   Physical Exam  Patient Vitals for the past 24 hrs:  BP Temp Temp src Pulse Resp SpO2 Height Weight  01/08/23 0122 135/79 -- -- 93 -- -- -- --  01/08/23 0120 -- -- -- -- -- 98 % -- --  01/08/23 0115 -- -- -- -- -- 98 % -- --  01/08/23 0110 -- -- -- -- -- 98 % -- --  01/08/23 0105 -- -- -- -- -- 99 % -- --  01/08/23 0100 -- -- -- -- -- 99 % -- --  01/08/23 0055 121/68 -- -- -- -- 98 % -- --  01/08/23 0054 121/68 -- -- (!) 108 -- -- -- --  01/08/23 0050 -- -- -- -- -- 100 % -- --  01/08/23 0045 -- -- -- -- -- 99 % -- --  01/08/23 0040 -- -- -- -- -- 99 % -- --  01/08/23 0035 -- -- -- -- -- 99 % -- --  01/08/23 0031 128/80 -- -- -- -- -- -- --  01/08/23 0030 -- -- -- -- -- 99 % -- --  01/08/23 0029 128/80 -- -- 92 -- -- -- --  01/08/23 0025 -- -- -- -- -- 100 % -- --  01/08/23 0020 -- -- -- -- -- 100 % -- --  01/08/23 0015 -- -- -- -- -- 100 % -- --  01/08/23 0010 -- -- -- -- -- 99 % -- --  01/08/23 0005 -- -- -- -- -- 99 % -- --  01/07/23 2201 131/69 98 F (36.7 C) Oral 98 17 100 % 5\' 5"  (1.651 m) 95.6 kg   Constitutional: Well-developed, well-nourished female in no acute distress   Cardiovascular: normal rate Respiratory: normal effort GI: Abd soft, non-tender MS: Extremities nontender, no edema, normal ROM. No bruising/tenderness to palpation of knees b/l Neurologic: Alert and oriented x 4   FHT:  Baseline 130, moderate variability, accelerations present, no  decelerations Contractions: irregular/UI  LAB RESULTS No results found for this or any previous visit (from the past 24 hour(s)).  O/Positive/-- (04/24 1118)  IMAGING BPP 6/8 for movement. Babe cephalic with anterior placenta. No signs of abruption.  MAU Management/MDM: Orders Placed This Encounter  Procedures   [place] MFM FETAL BPP WO NON STRESS   [place] MFM OB LIMITED   Discharge patient    Meds ordered this encounter  Medications   acetaminophen (TYLENOL) tablet 1,000 mg   NIFEdipine (PROCARDIA) capsule 10 mg     Available prenatal records reviewed.  Patient presents with fall. Patient is having some cramping/ctx pain. Do not suspect an abruption at this time given no fetal distress, severe pain, or VB. Reassuringly reactive tracing. Will obtain BPP. Tylenol, procardia for cramping/pain after fall. No suspicion of fracture or other injury from fall.  0300: Patient reassessed. Feels well and reports continued active fetal movement and denies VB. Cramping better. Feels comfortable to go home. Reviewed BPP. 6/8 for movement. Reviewed entirety of tracing and is reactive. As patient feels vigorous movement and no concerns for abruption, will discharge home with overall reassuring fetal status. Has close follow-up at [facility].  Case discussed with Dr. Berton Lan.  Patient's preferred language is Arabic. [employer] translator used during patient interaction.  ASSESSMENT 1. Fall, initial encounter   2. Abdominal pain during pregnancy in third trimester   3. [redacted] weeks gestation of pregnancy   4. NST (non-stress test) reactive     PLAN Discharge home with strict return precautions. Allergies as of 01/08/2023        Reactions   Pork-derived Products    Religious        Medication List     STOP taking these medications    metoCLOPramide 10 MG tablet Commonly known as: REGLAN   senna 8.6 MG Tabs tablet Commonly known as: SENOKOT       TAKE these medications    acetaminophen 325 MG tablet Commonly known as: TYLENOL Take 650 mg by mouth every 6 (six) hours as needed.   azelastine 0.1 % nasal spray Commonly known as: ASTELIN Place 2 sprays into both nostrils 2 (two) times daily. Use in each nostril as directed   cyclobenzaprine 10 MG tablet Commonly known as: FLEXERIL Take 1 tablet (10 mg total) by mouth every 8 (eight) hours as needed for muscle spasms.   fluticasone 50 MCG/ACT nasal spray Commonly known as: FLONASE Place into both nostrils.   ondansetron 4 MG tablet Commonly known as: ZOFRAN Take 1 tablet (4 mg total) by mouth every 6 (six) hours.   Prenatal Plus Vitamin/Mineral 27-1 MG Tabs Take 1 tablet by mouth daily.         Wylene Simmer, MD OB Fellow 01/08/2023  3:06 AM

## 2023-01-07 NOTE — MAU Note (Signed)
.  Deborah Lawson is a 32 y.o. at [redacted]w[redacted]d here in MAU reporting: fell on the floor - states she fell on her knees and denies hitting abdomen. Reports burning pain in knees, along with intermittent cramping in lower abdomen and lower back since the incident.  Denies VB or LOF. Reports less than normal FM since falling.   Onset of complaint: 1700 Pain score: 7 - knees; 7 - abdomen; 7 - back  Vitals:   01/07/23 2201  BP: 131/69  Pulse: 98  Resp: 17  Temp: 98 F (36.7 C)  SpO2: 100%     FHT:137 Lab orders placed from triage:  none

## 2023-01-08 ENCOUNTER — Inpatient Hospital Stay (HOSPITAL_BASED_OUTPATIENT_CLINIC_OR_DEPARTMENT_OTHER): Payer: Medicaid Other

## 2023-01-08 DIAGNOSIS — O9A213 Injury, poisoning and certain other consequences of external causes complicating pregnancy, third trimester: Secondary | ICD-10-CM | POA: Diagnosis not present

## 2023-01-08 DIAGNOSIS — O99891 Other specified diseases and conditions complicating pregnancy: Secondary | ICD-10-CM | POA: Diagnosis not present

## 2023-01-08 DIAGNOSIS — M545 Low back pain, unspecified: Secondary | ICD-10-CM | POA: Diagnosis not present

## 2023-01-08 DIAGNOSIS — O368130 Decreased fetal movements, third trimester, not applicable or unspecified: Secondary | ICD-10-CM | POA: Diagnosis not present

## 2023-01-08 DIAGNOSIS — O09293 Supervision of pregnancy with other poor reproductive or obstetric history, third trimester: Secondary | ICD-10-CM | POA: Diagnosis present

## 2023-01-08 DIAGNOSIS — W19XXXA Unspecified fall, initial encounter: Secondary | ICD-10-CM

## 2023-01-08 DIAGNOSIS — W1839XA Other fall on same level, initial encounter: Secondary | ICD-10-CM | POA: Diagnosis not present

## 2023-01-08 DIAGNOSIS — O34219 Maternal care for unspecified type scar from previous cesarean delivery: Secondary | ICD-10-CM

## 2023-01-08 DIAGNOSIS — Z3A37 37 weeks gestation of pregnancy: Secondary | ICD-10-CM

## 2023-01-08 DIAGNOSIS — R109 Unspecified abdominal pain: Secondary | ICD-10-CM | POA: Diagnosis not present

## 2023-01-08 DIAGNOSIS — O26893 Other specified pregnancy related conditions, third trimester: Secondary | ICD-10-CM | POA: Diagnosis present

## 2023-01-08 DIAGNOSIS — Z8632 Personal history of gestational diabetes: Secondary | ICD-10-CM

## 2023-01-10 ENCOUNTER — Inpatient Hospital Stay (HOSPITAL_COMMUNITY)
Admission: AD | Admit: 2023-01-10 | Discharge: 2023-01-13 | DRG: 787 | Disposition: A | Discharge status: Home / Self Care | Payer: Medicaid Other | Attending: Obstetrics and Gynecology | Admitting: Obstetrics and Gynecology

## 2023-01-10 ENCOUNTER — Inpatient Hospital Stay (HOSPITAL_COMMUNITY): Payer: Medicaid Other | Admitting: Anesthesiology

## 2023-01-10 ENCOUNTER — Encounter (HOSPITAL_COMMUNITY)
Admission: AD | Disposition: A | Discharge status: Home / Self Care | Payer: Self-pay | Source: Home / Self Care | Attending: Obstetrics and Gynecology

## 2023-01-10 ENCOUNTER — Other Ambulatory Visit: Payer: Self-pay

## 2023-01-10 ENCOUNTER — Ambulatory Visit (INDEPENDENT_AMBULATORY_CARE_PROVIDER_SITE_OTHER): Payer: Medicaid Other | Admitting: Obstetrics & Gynecology

## 2023-01-10 VITALS — BP 154/91 | HR 92 | Wt 206.0 lb

## 2023-01-10 DIAGNOSIS — O09293 Supervision of pregnancy with other poor reproductive or obstetric history, third trimester: Secondary | ICD-10-CM

## 2023-01-10 DIAGNOSIS — O322XX0 Maternal care for transverse and oblique lie, not applicable or unspecified: Secondary | ICD-10-CM | POA: Diagnosis present

## 2023-01-10 DIAGNOSIS — Z758 Other problems related to medical facilities and other health care: Secondary | ICD-10-CM

## 2023-01-10 DIAGNOSIS — Z3490 Encounter for supervision of normal pregnancy, unspecified, unspecified trimester: Principal | ICD-10-CM

## 2023-01-10 DIAGNOSIS — O34211 Maternal care for low transverse scar from previous cesarean delivery: Secondary | ICD-10-CM | POA: Diagnosis present

## 2023-01-10 DIAGNOSIS — O09299 Supervision of pregnancy with other poor reproductive or obstetric history, unspecified trimester: Secondary | ICD-10-CM

## 2023-01-10 DIAGNOSIS — Z8759 Personal history of other complications of pregnancy, childbirth and the puerperium: Secondary | ICD-10-CM

## 2023-01-10 DIAGNOSIS — Z3A38 38 weeks gestation of pregnancy: Secondary | ICD-10-CM

## 2023-01-10 DIAGNOSIS — O4103X0 Oligohydramnios, third trimester, not applicable or unspecified: Secondary | ICD-10-CM | POA: Diagnosis present

## 2023-01-10 DIAGNOSIS — Z8632 Personal history of gestational diabetes: Secondary | ICD-10-CM | POA: Diagnosis not present

## 2023-01-10 DIAGNOSIS — O0990 Supervision of high risk pregnancy, unspecified, unspecified trimester: Secondary | ICD-10-CM

## 2023-01-10 DIAGNOSIS — O0993 Supervision of high risk pregnancy, unspecified, third trimester: Secondary | ICD-10-CM

## 2023-01-10 DIAGNOSIS — Z603 Acculturation difficulty: Secondary | ICD-10-CM

## 2023-01-10 DIAGNOSIS — Z8249 Family history of ischemic heart disease and other diseases of the circulatory system: Secondary | ICD-10-CM | POA: Diagnosis not present

## 2023-01-10 DIAGNOSIS — O34219 Maternal care for unspecified type scar from previous cesarean delivery: Secondary | ICD-10-CM | POA: Diagnosis present

## 2023-01-10 DIAGNOSIS — O139 Gestational [pregnancy-induced] hypertension without significant proteinuria, unspecified trimester: Secondary | ICD-10-CM | POA: Diagnosis present

## 2023-01-10 DIAGNOSIS — O134 Gestational [pregnancy-induced] hypertension without significant proteinuria, complicating childbirth: Principal | ICD-10-CM | POA: Diagnosis present

## 2023-01-10 DIAGNOSIS — O321XX0 Maternal care for breech presentation, not applicable or unspecified: Secondary | ICD-10-CM | POA: Diagnosis not present

## 2023-01-10 DIAGNOSIS — O3463 Maternal care for abnormality of vagina, third trimester: Secondary | ICD-10-CM | POA: Diagnosis not present

## 2023-01-10 DIAGNOSIS — O329XX0 Maternal care for malpresentation of fetus, unspecified, not applicable or unspecified: Secondary | ICD-10-CM | POA: Diagnosis present

## 2023-01-10 LAB — CBC
HCT: 36.9 % (ref 36.0–46.0)
Hemoglobin: 12 g/dL (ref 12.0–15.0)
MCH: 26.6 pg (ref 26.0–34.0)
MCHC: 32.5 g/dL (ref 30.0–36.0)
MCV: 81.8 fL (ref 80.0–100.0)
Platelets: 308 10*3/uL (ref 150–400)
RBC: 4.51 MIL/uL (ref 3.87–5.11)
RDW: 14.2 % (ref 11.5–15.5)
WBC: 6.9 10*3/uL (ref 4.0–10.5)
nRBC: 0 % (ref 0.0–0.2)

## 2023-01-10 LAB — COMPREHENSIVE METABOLIC PANEL
ALT: 20 U/L (ref 0–44)
AST: 24 U/L (ref 15–41)
Albumin: 2.6 g/dL — ABNORMAL LOW (ref 3.5–5.0)
Alkaline Phosphatase: 138 U/L — ABNORMAL HIGH (ref 38–126)
Anion gap: 6 (ref 5–15)
BUN: 8 mg/dL (ref 6–20)
CO2: 21 mmol/L — ABNORMAL LOW (ref 22–32)
Calcium: 8.8 mg/dL — ABNORMAL LOW (ref 8.9–10.3)
Chloride: 107 mmol/L (ref 98–111)
Creatinine, Ser: 0.62 mg/dL (ref 0.44–1.00)
GFR, Estimated: >60 mL/min (ref >60)
Glucose, Bld: 100 mg/dL — ABNORMAL HIGH (ref 70–99)
Potassium: 3.9 mmol/L (ref 3.5–5.1)
Sodium: 134 mmol/L — ABNORMAL LOW (ref 135–145)
Total Bilirubin: 0.2 mg/dL — ABNORMAL LOW (ref 0.3–1.2)
Total Protein: 6.7 g/dL (ref 6.5–8.1)

## 2023-01-10 LAB — PROTEIN / CREATININE RATIO, URINE
Creatinine, Urine: 11 mg/dL
Total Protein, Urine: <6 mg/dL

## 2023-01-10 LAB — TYPE AND SCREEN
ABO/RH(D): O POS
Antibody Screen: NEGATIVE

## 2023-01-10 LAB — RUPTURE OF MEMBRANE (ROM)PLUS: Rom Plus: NEGATIVE

## 2023-01-10 SURGERY — Surgical Case
Anesthesia: Spinal

## 2023-01-10 MED ORDER — PHENYLEPHRINE HCL-NACL 20-0.9 MG/250ML-% IV SOLN
INTRAVENOUS | Status: AC
  Filled 2023-01-10: qty 250

## 2023-01-10 MED ORDER — FENTANYL CITRATE (PF) 100 MCG/2ML IJ SOLN
INTRAMUSCULAR | Status: DC | PRN
  Administered 2023-01-10: 15 ug via INTRATHECAL

## 2023-01-10 MED ORDER — MORPHINE SULFATE (PF) 0.5 MG/ML IJ SOLN
INTRAMUSCULAR | Status: AC
  Filled 2023-01-10: qty 10

## 2023-01-10 MED ORDER — ONDANSETRON HCL 4 MG/2ML IJ SOLN
INTRAMUSCULAR | Status: DC | PRN
  Administered 2023-01-10: 4 mg via INTRAVENOUS

## 2023-01-10 MED ORDER — HYDROMORPHONE HCL 1 MG/ML IJ SOLN: 0.2500 mg | INTRAMUSCULAR | Status: DC | PRN

## 2023-01-10 MED ORDER — LACTATED RINGERS IV SOLN: INTRAVENOUS | Status: DC

## 2023-01-10 MED ORDER — DIPHENHYDRAMINE HCL 25 MG PO CAPS: 25.0000 mg | ORAL_CAPSULE | ORAL | Status: DC | PRN

## 2023-01-10 MED ORDER — SCOPOLAMINE 1 MG/3DAYS TD PT72
MEDICATED_PATCH | TRANSDERMAL | Status: AC
  Filled 2023-01-10: qty 1

## 2023-01-10 MED ORDER — SODIUM CHLORIDE 0.9 % IV SOLN
INTRAVENOUS | Status: AC
  Filled 2023-01-10: qty 5

## 2023-01-10 MED ORDER — OXYCODONE HCL 5 MG PO TABS: 5.0000 mg | ORAL_TABLET | Freq: Once | ORAL | Status: DC | PRN

## 2023-01-10 MED ORDER — OXYTOCIN-SODIUM CHLORIDE 30-0.9 UT/500ML-% IV SOLN
INTRAVENOUS | Status: AC
  Filled 2023-01-10: qty 500

## 2023-01-10 MED ORDER — OXYTOCIN-SODIUM CHLORIDE 30-0.9 UT/500ML-% IV SOLN
INTRAVENOUS | Status: DC | PRN
  Administered 2023-01-10: 300 mL via INTRAVENOUS

## 2023-01-10 MED ORDER — ONDANSETRON HCL 4 MG/2ML IJ SOLN: 4.0000 mg | Freq: Once | INTRAMUSCULAR | Status: DC | PRN

## 2023-01-10 MED ORDER — SODIUM CHLORIDE 0.9 % IV SOLN
500.0000 mg | INTRAVENOUS | Status: AC
  Administered 2023-01-10: 500 mg via INTRAVENOUS

## 2023-01-10 MED ORDER — FENTANYL CITRATE (PF) 100 MCG/2ML IJ SOLN
INTRAMUSCULAR | Status: AC
  Filled 2023-01-10: qty 2

## 2023-01-10 MED ORDER — PHENYLEPHRINE HCL-NACL 20-0.9 MG/250ML-% IV SOLN
INTRAVENOUS | Status: DC | PRN
  Administered 2023-01-10: 60 ug/min via INTRAVENOUS

## 2023-01-10 MED ORDER — SCOPOLAMINE 1 MG/3DAYS TD PT72
1.0000 | MEDICATED_PATCH | Freq: Once | TRANSDERMAL | Status: DC
  Administered 2023-01-10: 1.5 mg via TRANSDERMAL

## 2023-01-10 MED ORDER — KETOROLAC TROMETHAMINE 30 MG/ML IJ SOLN
INTRAMUSCULAR | Status: AC
  Filled 2023-01-10: qty 1

## 2023-01-10 MED ORDER — FENTANYL CITRATE (PF) 100 MCG/2ML IJ SOLN
INTRAMUSCULAR | Status: DC | PRN
Start: 2023-01-10 — End: 2023-01-10
  Administered 2023-01-10: 85 ug via INTRAVENOUS

## 2023-01-10 MED ORDER — METOCLOPRAMIDE HCL 10 MG PO TABS
10.0000 mg | ORAL_TABLET | Freq: Once | ORAL | Status: AC
  Administered 2023-01-10: 10 mg via ORAL
  Filled 2023-01-10: qty 1

## 2023-01-10 MED ORDER — DEXAMETHASONE SODIUM PHOSPHATE 4 MG/ML IJ SOLN
INTRAMUSCULAR | Status: AC
  Filled 2023-01-10: qty 1

## 2023-01-10 MED ORDER — MORPHINE SULFATE (PF) 0.5 MG/ML IJ SOLN
INTRAMUSCULAR | Status: DC | PRN
  Administered 2023-01-10: 150 ug via INTRATHECAL

## 2023-01-10 MED ORDER — SOD CITRATE-CITRIC ACID 500-334 MG/5ML PO SOLN
30.0000 mL | Freq: Once | ORAL | Status: AC
  Administered 2023-01-10: 30 mL via ORAL
  Filled 2023-01-10: qty 30

## 2023-01-10 MED ORDER — LACTATED RINGERS IV BOLUS
1000.0000 mL | Freq: Once | INTRAVENOUS | Status: AC
  Administered 2023-01-10: 1000 mL via INTRAVENOUS

## 2023-01-10 MED ORDER — KETOROLAC TROMETHAMINE 30 MG/ML IJ SOLN
30.0000 mg | Freq: Once | INTRAMUSCULAR | Status: AC | PRN
  Administered 2023-01-10: 30 mg via INTRAVENOUS

## 2023-01-10 MED ORDER — OXYCODONE HCL 5 MG/5ML PO SOLN
5.0000 mg | Freq: Once | ORAL | Status: DC | PRN
Start: 2023-01-10 — End: 2023-01-10

## 2023-01-10 MED ORDER — DIPHENHYDRAMINE HCL 50 MG/ML IJ SOLN: 12.5000 mg | INTRAMUSCULAR | Status: DC | PRN

## 2023-01-10 MED ORDER — NALOXONE HCL 4 MG/10ML IJ SOLN: 1.0000 ug/kg/h | INTRAVENOUS | Status: DC | PRN

## 2023-01-10 MED ORDER — BUPIVACAINE IN DEXTROSE 0.75-8.25 % IT SOLN
INTRATHECAL | Status: DC | PRN
  Administered 2023-01-10: 12 mg via INTRATHECAL

## 2023-01-10 MED ORDER — MEPERIDINE HCL 25 MG/ML IJ SOLN: 6.2500 mg | INTRAMUSCULAR | Status: DC | PRN

## 2023-01-10 MED ORDER — ONDANSETRON HCL 4 MG/2ML IJ SOLN: 4.0000 mg | Freq: Three times a day (TID) | INTRAMUSCULAR | Status: DC | PRN

## 2023-01-10 MED ORDER — DEXAMETHASONE SODIUM PHOSPHATE 10 MG/ML IJ SOLN
INTRAMUSCULAR | Status: DC | PRN
  Administered 2023-01-10: 4 mg via INTRAVENOUS

## 2023-01-10 MED ORDER — FAMOTIDINE IN NACL 20-0.9 MG/50ML-% IV SOLN
20.0000 mg | Freq: Once | INTRAVENOUS | Status: AC
  Administered 2023-01-10: 20 mg via INTRAVENOUS
  Filled 2023-01-10: qty 50

## 2023-01-10 MED ORDER — SOD CITRATE-CITRIC ACID 500-334 MG/5ML PO SOLN: 30.0000 mL | ORAL | Status: DC

## 2023-01-10 MED ORDER — SODIUM CHLORIDE 0.9 % IR SOLN
Status: DC | PRN
  Administered 2023-01-10: 1

## 2023-01-10 MED ORDER — ONDANSETRON HCL 4 MG/2ML IJ SOLN
INTRAMUSCULAR | Status: AC
  Filled 2023-01-10: qty 2

## 2023-01-10 MED ORDER — SODIUM CHLORIDE 0.9% FLUSH
3.0000 mL | INTRAVENOUS | Status: DC | PRN
Start: 2023-01-10 — End: 2023-01-13

## 2023-01-10 MED ORDER — CEFAZOLIN SODIUM-DEXTROSE 2-4 GM/100ML-% IV SOLN
2.0000 g | INTRAVENOUS | Status: AC
  Administered 2023-01-10: 2 g via INTRAVENOUS
  Filled 2023-01-10: qty 100

## 2023-01-10 MED ORDER — ACETAMINOPHEN-CAFFEINE 500-65 MG PO TABS
2.0000 | ORAL_TABLET | Freq: Once | ORAL | Status: AC
  Administered 2023-01-10: 2 via ORAL
  Filled 2023-01-10: qty 2

## 2023-01-10 MED ORDER — LACTATED RINGERS IV SOLN: INTRAVENOUS | Status: DC | PRN

## 2023-01-10 MED ORDER — NALOXONE HCL 0.4 MG/ML IJ SOLN: 0.4000 mg | INTRAMUSCULAR | Status: DC | PRN

## 2023-01-10 MED ORDER — CALCIUM CARBONATE ANTACID 500 MG PO CHEW
2.0000 | CHEWABLE_TABLET | Freq: Once | ORAL | Status: AC
  Administered 2023-01-10: 400 mg via ORAL
  Filled 2023-01-10: qty 2

## 2023-01-10 SURGICAL SUPPLY — 33 items
APL PRP STRL LF DISP 70% ISPRP (MISCELLANEOUS) ×2
APL SKNCLS STERI-STRIP NONHPOA (GAUZE/BANDAGES/DRESSINGS) ×1
BENZOIN TINCTURE PRP APPL 2/3 (GAUZE/BANDAGES/DRESSINGS) IMPLANT
CHLORAPREP W/TINT 26 (MISCELLANEOUS) ×2 IMPLANT
CLAMP UMBILICAL CORD (MISCELLANEOUS) ×1 IMPLANT
CLOTH BEACON ORANGE TIMEOUT ST (SAFETY) ×1 IMPLANT
DRSG OPSITE POSTOP 4X10 (GAUZE/BANDAGES/DRESSINGS) ×1 IMPLANT
ELECT REM PT RETURN 9FT ADLT (ELECTROSURGICAL) ×1
ELECTRODE REM PT RTRN 9FT ADLT (ELECTROSURGICAL) ×1 IMPLANT
EXTRACTOR VACUUM KIWI (MISCELLANEOUS) IMPLANT
GAUZE SPONGE 4X4 12PLY STRL LF (GAUZE/BANDAGES/DRESSINGS) IMPLANT
GLOVE BIO SURGEON STRL SZ 6 (GLOVE) ×1 IMPLANT
GLOVE BIOGEL PI IND STRL 7.0 (GLOVE) ×2 IMPLANT
GOWN STRL REUS W/TWL LRG LVL3 (GOWN DISPOSABLE) ×2 IMPLANT
KIT ABG SYR 3ML LUER SLIP (SYRINGE) IMPLANT
NDL HYPO 25X5/8 SAFETYGLIDE (NEEDLE) IMPLANT
NEEDLE HYPO 22GX1.5 SAFETY (NEEDLE) IMPLANT
NEEDLE HYPO 25X5/8 SAFETYGLIDE (NEEDLE) IMPLANT
NS IRRIG 1000ML POUR BTL (IV SOLUTION) ×1 IMPLANT
PACK C SECTION WH (CUSTOM PROCEDURE TRAY) ×1 IMPLANT
PAD OB MATERNITY 4.3X12.25 (PERSONAL CARE ITEMS) ×1 IMPLANT
RETRACTOR WND ALEXIS 25 LRG (MISCELLANEOUS) IMPLANT
RTRCTR WOUND ALEXIS 25CM LRG (MISCELLANEOUS)
STRIP CLOSURE SKIN 1/2X4 (GAUZE/BANDAGES/DRESSINGS) IMPLANT
SUT MON AB 4-0 PS1 27 (SUTURE) ×1 IMPLANT
SUT PLAIN 0 NONE (SUTURE) ×1 IMPLANT
SUT VIC AB 0 CT1 36 (SUTURE) ×3 IMPLANT
SUT VIC AB 2-0 CT1 27 (SUTURE) ×1
SUT VIC AB 2-0 CT1 TAPERPNT 27 (SUTURE) ×1 IMPLANT
SYR CONTROL 10ML LL (SYRINGE) IMPLANT
TOWEL OR 17X24 6PK STRL BLUE (TOWEL DISPOSABLE) ×1 IMPLANT
TRAY FOLEY W/BAG SLVR 14FR LF (SET/KITS/TRAYS/PACK) IMPLANT
WATER STERILE IRR 1000ML POUR (IV SOLUTION) ×1 IMPLANT

## 2023-01-10 NOTE — Progress Notes (Signed)
Bedside US reveals transverse position. I repeated the [place]. Confirmed transverse - appears back up. With pt consent, CE done - closed and no presenting part c/w [place].   Discussed risks of ECV. Risks of ECV including abnormal fetal heart rate, PROM, abruption, injury to fetus, possible emergency c-section, and failed ECV were discussed with the pt. Discussed specific components of her presentation that makes her ECV higher risk and less likely to be successful -- she has prior c-section (for transverse), low fluid, anterior placenta, and EFW was 86%ile by last [place] so larger baby. However, she is multiparous and the position is transverse so these would be in her favor. Reviewed risks with the help of the interpreter.   The patient then reported LOF since a few days ago and wonders if this could be why low fluid. Will confirm with amnisure since now s/p exam. Reviewed if ROM, would not do ECV.  We will readdress once amnisure is back.   Milas Hock, MD Attending Obstetrician & Gynecologist, [facility] for [facility], [facility]

## 2023-01-10 NOTE — MAU Provider Note (Signed)
History     CSN: [id number]  Arrival date and time: 01/10/23 1446   Event Date/Time   First Provider Initiated Contact with Patient 01/10/23 1528      Chief Complaint  Patient presents with   Hypertension   Deborah Lawson is a 32 y.o. [id number] at [redacted]w[redacted]d who receives care at [facility].  She presents today for elevated blood pressure, during office evaluation. She denies visual disturbances or RUQ pain. Patient endorses HA that started a few days ago and is relieved with tylenol.  She reports last dose was at 1130am. She rates her HA a 8/10.  Patient endorses fetal movement. Patient endorses some contractions and cramping. Patient denies vaginal complaints.   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   Vitamin D deficiency 11/11/2020    Past Surgical History:  Procedure Laterality Date   CESAREAN SECTION N/A 10/26/2019   Procedure: CESAREAN SECTION;  Surgeon: Reva Bores, MD;  Location: [facility] LD ORS;  Service: Obstetrics;  Laterality: N/A;   MANDIBLE SURGERY      Family History  Problem Relation Age of Onset   Allergic rhinitis Mother    Hypertension Father     Social History   Tobacco Use   Smoking status: Never    Passive exposure: Never   Smokeless tobacco: Never  Vaping Use   Vaping status: Never Used  Substance Use Topics   Alcohol use: Never   Drug use: Never    Allergies:  Allergies  Allergen Reactions   Pork-Derived Products     Religious    Medications Prior to Admission  Medication Sig Dispense Refill Last Dose    acetaminophen (TYLENOL) 325 MG tablet Take 650 mg by mouth every 6 (six) hours as needed.   01/10/2023   Prenatal Vit-Fe Fumarate-FA (PRENATAL PLUS VITAMIN/MINERAL) 27-1 MG TABS Take 1 tablet by mouth daily. 30 tablet 11 01/10/2023   azelastine (ASTELIN) 0.1 % nasal spray Place 2 sprays into both nostrils 2 (two) times daily. Use in each nostril as directed 30 mL 5    cyclobenzaprine (FLEXERIL) 10 MG tablet Take 1 tablet (10 mg total) by mouth every 8 (eight) hours as needed for muscle spasms. 20 tablet 0    fluticasone (FLONASE) 50 MCG/ACT nasal spray Place into both nostrils.      ondansetron (ZOFRAN) 4 MG tablet Take 1 tablet (4 mg total) by mouth every 6 (six) hours. 12 tablet 0     Review of Systems  Eyes:  Negative for visual disturbance.  Gastrointestinal:  Positive for abdominal pain (With ctx), nausea and vomiting.  Genitourinary:  Negative for difficulty urinating, dysuria, vaginal bleeding and vaginal discharge.  Neurological:  Positive for headaches. Negative for dizziness and light-headedness.   Physical Exam   Blood pressure (!) 147/83, pulse 93, temperature 98.6 F (37 C), temperature source Oral, resp.  rate 16, last menstrual period 04/18/2022, SpO2 100%.  Vitals:   01/10/23 1517 01/10/23 1518 01/10/23 1539 01/10/23 1547  BP: (!) 147/83 (!) 147/83 136/85 (!) 141/87   01/10/23 1602 01/10/23 1617  BP: 135/84 (!) 140/92      Physical Exam Vitals and nursing note reviewed.  Constitutional:      Appearance: Normal appearance.  HENT:     Head: Normocephalic and atraumatic.  Eyes:     Conjunctiva/sclera: Conjunctivae normal.  Cardiovascular:     Rate and Rhythm: Normal rate.  Musculoskeletal:        General: Normal range of motion.     Cervical back: Normal range of motion.  Skin:    General: Skin is warm.  Neurological:     Mental Status: She is alert and oriented to person, place, and time.  Psychiatric:        Mood and Affect: Mood normal.        Behavior:  Behavior normal.    Fetal Assessment 140 bpm, Mod Var, -Decels, +15x15 Accels Toco: No ctx graphed  MAU Course   Results for orders placed or performed during the hospital encounter of 01/10/23 (from the past 24 hour(s))  Protein / creatinine ratio, urine     Status: None   Collection Time: 01/10/23  3:23 PM  Result Value Ref Range   Creatinine, Urine 11 mg/dL   Total Protein, Urine <6 mg/dL   Protein Creatinine Ratio        0.00 - 0.15 mg/mg[Cre]  CBC     Status: None   Collection Time: 01/10/23  3:33 PM  Result Value Ref Range   WBC 6.9 4.0 - 10.5 K/uL   RBC 4.51 3.87 - 5.11 MIL/uL   Hemoglobin 12.0 12.0 - 15.0 g/dL   HCT 16.1 09.6 - 04.5 %   MCV 81.8 80.0 - 100.0 fL   MCH 26.6 26.0 - 34.0 pg   MCHC 32.5 30.0 - 36.0 g/dL   RDW 40.9 81.1 - 91.4 %   Platelets 308 150 - 400 K/uL   nRBC 0.0 0.0 - 0.2 %  Comprehensive metabolic panel     Status: Abnormal   Collection Time: 01/10/23  3:33 PM  Result Value Ref Range   Sodium 134 (L) 135 - 145 mmol/L   Potassium 3.9 3.5 - 5.1 mmol/L   Chloride 107 98 - 111 mmol/L   CO2 21 (L) 22 - 32 mmol/L   Glucose, Bld 100 (H) 70 - 99 mg/dL   BUN 8 6 - 20 mg/dL   Creatinine, Ser 7.82 0.44 - 1.00 mg/dL   Calcium 8.8 (L) 8.9 - 10.3 mg/dL   Total Protein 6.7 6.5 - 8.1 g/dL   Albumin 2.6 (L) 3.5 - 5.0 g/dL   AST 24 15 - 41 U/L   ALT 20 0 - 44 U/L   Alkaline Phosphatase 138 (H) 38 - 126 U/L   Total Bilirubin 0.2 (L) 0.3 - 1.2 mg/dL   GFR, Estimated >95 >62 mL/min   Anion gap 6 5 - 15   No results found.  MDM Physical Exam Labs: CBC, CMP, PC Ratio Measure BPQ15 min EFM Pain Management Antiemetic Assessment and Plan  32 year old [id number]  SIUP at 38.1 weeks Cat I FT Elevated BP Language Barrier  -POC Reviewed. -Exam performed and findings discussed. -Labs ordered. -Patient offered and accepts pain medication. -Give excedrin migraine for HA, reglan for nausea, and tums for Heartburn.  -NST reactive. -Patient informed that if  bps remain elevated, labs return positive for PreE, or if HA unresolved will move for IOL. -Patient without questions. -Interpretations completed with assistance of video interpreter: May [id number].  Cherre Robins MSN, CNM 01/10/2023, 3:28 PM    Reassessment (4:45 PM) -Patient reports HA improved, but still present. Rates 4/10. -Informed that labs are normal, but bps remain elevated.  -Also now reporting left side abdominal pain that she describes as pressure that is constant.  She states the symptoms started 2 days ago and is worse with the cold weather.  Patient also reports + cough for the past 3 days.  -NST remains reactive.  -Discussed admission with induction.  -Addressed questions and concerns.  -Interpretations completed with assistance of video interpreter: Hadeel [id number]. -L&D team notified of patient status and recommendation for admission.  Cherre Robins MSN, CNM Advanced Practice Provider, [facility] for [employer]

## 2023-01-10 NOTE — Anesthesia Postprocedure Evaluation (Signed)
Anesthesia Post Note  Patient: Deborah Lawson  Procedure(s) Performed: CESAREAN SECTION     Patient location during evaluation: Mother Baby Anesthesia Type: Spinal Level of consciousness: oriented and awake and alert Pain management: pain level controlled Vital Signs Assessment: post-procedure vital signs reviewed and stable Respiratory status: spontaneous breathing and respiratory function stable Cardiovascular status: blood pressure returned to baseline and stable Postop Assessment: no headache, no backache, no apparent nausea or vomiting and able to ambulate Anesthetic complications: no   No notable events documented.  Last Vitals:  Vitals:   01/10/23 2246 01/10/23 2300  BP: (!) 162/139 100/86  Pulse:  78  Resp:  (!) 24  Temp:    SpO2:  97%    Last Pain:  Vitals:   01/10/23 2300  TempSrc:   PainSc: 4    Pain Goal:    LLE Motor Response: Purposeful movement, Responds to commands (01/10/23 2300) LLE Sensation: Tingling (01/10/23 2300) RLE Motor Response: Responds to commands, Purposeful movement (01/10/23 2300) RLE Sensation: Tingling (01/10/23 2300)     Epidural/Spinal Function Cutaneous sensation: Able to Wiggle Toes (01/10/23 2300), Patient able to flex knees: Yes (01/10/23 2300), Patient able to lift hips off bed: Yes (01/10/23 2300), Back pain beyond tenderness at insertion site: No (01/10/23 2300), Progressively worsening motor and/or sensory loss: No (01/10/23 2300), Bowel and/or bladder incontinence post epidural: No (01/10/23 2300)  Trevor Iha

## 2023-01-10 NOTE — Anesthesia Procedure Notes (Signed)
Spinal  Patient location during procedure: OB Start time: 01/10/2023 9:23 PM End time: 01/10/2023 9:32 PM Reason for block: surgical anesthesia Staffing Performed: anesthesiologist  Anesthesiologist: Trevor Iha, MD Performed by: Trevor Iha, MD Authorized by: Trevor Iha, MD   Preanesthetic Checklist Completed: patient identified, IV checked, risks and benefits discussed, surgical consent, monitors and equipment checked, pre-op evaluation and timeout performed Spinal Block Patient position: sitting Prep: DuraPrep and site prepped and draped Patient monitoring: heart rate, cardiac monitor, continuous pulse ox and blood pressure Approach: midline Location: L3-4 Injection technique: single-shot Needle Needle type: Pencan  Needle gauge: 24 G Needle length: 10 cm Needle insertion depth: 8 cm Assessment Sensory level: T4 Events: CSF return Additional Notes  3 Attempt (s) L3-4 . Pt tolerated procedure well.

## 2023-01-10 NOTE — H&P (Signed)
OBSTETRIC ADMISSION HISTORY AND PHYSICAL  Deborah Lawson is a 32 y.o. female [phone] with IUP at [redacted]w[redacted]d by LMP presenting for HBP ultimately diagnosed with gHTN. She was found to have malpresentation upon admission.  She reports +FMs, No LOF, no VB, no blurry vision, or peripheral edema, and RUQ pain.  She had a headache but it improved with medication. She plans on formula feeding. She request paragard for birth control but would like it at her PP visit.  She received her prenatal care at [facility]   Dating: By LMP=9 wk [place] --->  Estimated Date of Delivery: 01/23/23  Sono:   9/17 [place] was CWD, normal anatomy, cephalic presentation, 2290g, 45% EFW 10/26 - AFI 5, cephalic  Prenatal History/Complications:  H/o GDM - normal 2 hr this pregnancy GHTN - diagnosed today - CBC and CMP wnl.  Vitamin D deficiency Language barrier - speaks much [place], but interpreter video used throughout our interaction.  H/o c-section- 2021 due to transverse position.   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   Vitamin D deficiency 11/11/2020    Past Surgical History: Past Surgical History:  Procedure Laterality Date   CESAREAN SECTION N/A 10/26/2019   Procedure: CESAREAN SECTION;  Surgeon: Reva Bores, MD;  Location: [facility] LD ORS;  Service: Obstetrics;  Laterality: N/A;   MANDIBLE SURGERY      Obstetrical History: OB History     Gravida  5   Para  3   Term  3   Preterm  0   AB  1   Living  3      SAB  1   IAB  0   Ectopic  0   Multiple  0   Live Births  3           Social History Social History   Socioeconomic History   Marital status: Married    Spouse name: Not on file   Number of children: Not on file    Years of education: Not on file   Highest education level: 12th grade  Occupational History   Not on file  Tobacco Use   Smoking status: Never    Passive exposure: Never   Smokeless tobacco: Never  Vaping Use   Vaping status: Never Used  Substance and Sexual Activity   Alcohol use: Never   Drug use: Never   Sexual activity: Yes    Birth control/protection: None    Comment: last ic 3 days ago  Other Topics Concern   Not on file  Social History Narrative   Right handed   Social Determinants of Health   Financial Resource Strain: Low Risk  (03/16/2022)   Overall Financial Resource Strain (CARDIA)    Difficulty of Paying Living Expenses: Not very hard  Food Insecurity: Low Risk  (08/19/2022)   [facility]   Hunger Vital Sign    Worried About Running Out of Food in the Last Year: Never true    Ran Out of Food in the Last Year: Never true  Transportation Needs: No Transportation Needs (01/10/2023)   PRAPARE - Administrator, Civil Service (Medical): No    Lack of Transportation (Non-Medical): No  Physical Activity: Unknown (03/16/2022)  Exercise Vital Sign    Days of Exercise per Week: 1 day    Minutes of Exercise per Session: Patient declined  Stress: No Stress Concern Present (03/16/2022)   [employer] of Occupational Health - Occupational Stress Questionnaire    Feeling of Stress : Only a little  Social Connections: Moderately Isolated (01/10/2023)   Social Connection and Isolation Panel [NHANES]    Frequency of Communication with Friends and Family: More than three times a week    Frequency of Social Gatherings with Friends and Family: Patient declined    Attends Religious Services: Never    Database administrator or Organizations: No    Attends Engineer, structural: Not on file    Marital Status: Married    Family History: Family History  Problem Relation Age of Onset   Allergic rhinitis Mother    Hypertension  Father     Allergies: Allergies  Allergen Reactions   Pork-Derived Products     Religious    Medications Prior to Admission  Medication Sig Dispense Refill Last Dose   acetaminophen (TYLENOL) 325 MG tablet Take 650 mg by mouth every 6 (six) hours as needed.   01/10/2023   Prenatal Vit-Fe Fumarate-FA (PRENATAL PLUS VITAMIN/MINERAL) 27-1 MG TABS Take 1 tablet by mouth daily. 30 tablet 11 01/10/2023   azelastine (ASTELIN) 0.1 % nasal spray Place 2 sprays into both nostrils 2 (two) times daily. Use in each nostril as directed 30 mL 5    cyclobenzaprine (FLEXERIL) 10 MG tablet Take 1 tablet (10 mg total) by mouth every 8 (eight) hours as needed for muscle spasms. 20 tablet 0    fluticasone (FLONASE) 50 MCG/ACT nasal spray Place into both nostrils.      ondansetron (ZOFRAN) 4 MG tablet Take 1 tablet (4 mg total) by mouth every 6 (six) hours. 12 tablet 0      Review of Systems   All systems reviewed and negative except as stated in HPI  Blood pressure 136/89, pulse 98, temperature 98.6 F (37 C), temperature source Oral, resp. rate 16, last menstrual period 04/18/2022, SpO2 99%. General appearance: alert, cooperative, and no distress Lungs: clear to auscultation bilaterally Heart: regular rate and rhythm Abdomen: soft, non-tender; bowel sounds normal Pelvic: closed, thick, high, no presenting part in the pelvis Extremities: Homans sign is negative, no sign of DVT Presentation:  transverse back up  Fetal monitoringBaseline: 130 bpm, Variability: Good {> 6 bpm), Accelerations: Reactive, and Decelerations: Absent Uterine activity: Irregular  Dilation: Closed Exam by:: Jaymes Graff, MD   Prenatal labs: ABO, Rh: O/Positive/-- (04/24 1118) Antibody: Negative (04/24 1118) Rubella: 24.50 (04/24 1118) RPR: Non Reactive (08/26 0816)  HBsAg: Negative (04/24 1118)  HIV: Non Reactive (08/26 0816)  GBS: Negative/-- (10/16 0921)  2 hr Glucola wnl Genetic screening  LR female Anatomy US  Normal   Prenatal Transfer Tool  Maternal Diabetes: No Genetic Screening: Normal Maternal Ultrasounds/Referrals: Normal Fetal Ultrasounds or other Referrals:  None Maternal Substance Abuse:  No Significant Maternal Medications:  None Significant Maternal Lab Results:  Group B Strep negative Number of Prenatal Visits:greater than 3 verified prenatal visits Other Comments:  None  Results for orders placed or performed during the hospital encounter of 01/10/23 (from the past 24 hour(s))  Protein / creatinine ratio, urine   Collection Time: 01/10/23  3:23 PM  Result Value Ref Range   Creatinine, Urine 11 mg/dL   Total Protein, Urine <6 mg/dL   Protein Creatinine Ratio  0.00 - 0.15 mg/mg[Cre]  CBC   Collection Time: 01/10/23  3:33 PM  Result Value Ref Range   WBC 6.9 4.0 - 10.5 K/uL   RBC 4.51 3.87 - 5.11 MIL/uL   Hemoglobin 12.0 12.0 - 15.0 g/dL   HCT 16.1 09.6 - 04.5 %   MCV 81.8 80.0 - 100.0 fL   MCH 26.6 26.0 - 34.0 pg   MCHC 32.5 30.0 - 36.0 g/dL   RDW 40.9 81.1 - 91.4 %   Platelets 308 150 - 400 K/uL   nRBC 0.0 0.0 - 0.2 %  Comprehensive metabolic panel   Collection Time: 01/10/23  3:33 PM  Result Value Ref Range   Sodium 134 (L) 135 - 145 mmol/L   Potassium 3.9 3.5 - 5.1 mmol/L   Chloride 107 98 - 111 mmol/L   CO2 21 (L) 22 - 32 mmol/L   Glucose, Bld 100 (H) 70 - 99 mg/dL   BUN 8 6 - 20 mg/dL   Creatinine, Ser 7.82 0.44 - 1.00 mg/dL   Calcium 8.8 (L) 8.9 - 10.3 mg/dL   Total Protein 6.7 6.5 - 8.1 g/dL   Albumin 2.6 (L) 3.5 - 5.0 g/dL   AST 24 15 - 41 U/L   ALT 20 0 - 44 U/L   Alkaline Phosphatase 138 (H) 38 - 126 U/L   Total Bilirubin 0.2 (L) 0.3 - 1.2 mg/dL   GFR, Estimated >95 >62 mL/min   Anion gap 6 5 - 15  Rupture of Membrane (ROM) Plus   Collection Time: 01/10/23  6:12 PM  Result Value Ref Range   Rom Plus NEGATIVE     Patient Active Problem List   Diagnosis Date Noted   Pregnancy 01/10/2023   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 [id number] at [redacted]w[redacted]d here for GHTN found to have malpresentation in setting of prior c-section.   The risks of surgery were discussed with the patient including but were not limited to: bleeding which may require transfusion or reoperation; infection which may require antibiotics; injury to bowel, bladder, ureters or other surrounding organs; injury to the fetus; need for additional procedures including hysterectomy in the event of a life-threatening hemorrhage; formation of adhesions; placental abnormalities with subsequent pregnancies; incisional problems; thromboembolic phenomenon and other postoperative/anesthesia complications.    We discussed the risk of ECV and TOLAC. See prior note for ECV risks. Reviewed risk of uterine rupture.   The patient declines ECV and would like to proceed with c-section. She gavce informed written consent for the procedure.   Patient has been NPO since 12pm and she will remain NPO for procedure. Anesthesia and OR aware. Preoperative prophylactic antibiotics and SCDs ordered on call to the OR.  To OR when ready.  #FWB: reactive #ID:  Ancef/Azithro #MOF: Formula #MOC: Cu-IUD at [facility] visit  Will monitor Bps after delivery for management. No indication for Magnesium at this time.   Milas Hock, MD  01/10/2023, 8:18 PM

## 2023-01-10 NOTE — Op Note (Signed)
Deborah Lawson Stephanie PROCEDURE DATE: 01/10/2023  PREOPERATIVE DIAGNOSES: Intrauterine pregnancy at [redacted]w[redacted]d weeks gestation; malpresentation: transverse, backup, oligohydramnios, previous uterine incision LTCSx1, and GHTN  POSTOPERATIVE DIAGNOSES: The same  PROCEDURE: Repeat Low Transverse Cesarean Section  SURGEON:  Dr. Milas Hock  ASSISTANT:  Dr. Wyn Forster  ANESTHESIOLOGY TEAM: Anesthesiologist: Trevor Iha, MD CRNA: Renford Dills, CRNA  INDICATIONS: Deborah Lawson is a 32 y.o. [id number] at [redacted]w[redacted]d here for cesarean section secondary to the indications listed under preoperative diagnoses; please see preoperative note for further details.  The risks of surgery were discussed with the patient including but were not limited to: bleeding which may require transfusion or reoperation; infection which may require antibiotics; injury to bowel, bladder, ureters or other surrounding organs; injury to the fetus; need for additional procedures including hysterectomy in the event of a life-threatening hemorrhage; formation of adhesions; placental abnormalities wth subsequent pregnancies; incisional problems; thromboembolic phenomenon and other postoperative/anesthesia complications.  The patient concurred with the proposed plan, giving informed written consent for the procedure.    FINDINGS:  Viable female infant in cephalic presentation.  Apgars 8 and 9.  Clear amniotic fluid.  Intact placenta, three vessel cord.  Normal uterus, fallopian tubes and ovaries bilaterally.  ANESTHESIA: Spinal INTRAVENOUS FLUIDS: 1500 ml   ESTIMATED BLOOD LOSS: 368 ml URINE OUTPUT:  200 ml SPECIMENS: Placenta sent to L&D COMPLICATIONS: None immediate  PROCEDURE IN DETAIL:  The patient preoperatively received intravenous antibiotics and had sequential compression devices applied to her lower extremities.  She was then taken to the operating room where spinal anesthesia was administered was  administed. She was then placed in a dorsal supine position with a leftward tilt, and prepped and draped in a sterile manner.  A foley catheter was placed into her bladder and attached to constant gravity.    After an adequate timeout was performed, a Pfannenstiel skin incision was made with scalpel on her preexisting scar and carried through to the underlying layer of fascia. The fascia was incised in the midline, and this incision was extended bilaterally using the Mayo scissors.  Kocher clamps were applied to the superior aspect of the fascial incision and the underlying rectus muscles were dissected off bluntly and sharply.  A similar process was carried out on the inferior aspect of the fascial incision. The rectus muscles were separated in the midline and the peritoneum was entered bluntly. The Alexis self-retaining retractor was introduced into the abdominal cavity.  Attention was turned to the lower uterine segment where a low transverse hysterotomy was made with a scalpel and extended bilaterally bluntly.  The infant was successfully delivered, the cord was clamped and cut after one minute, and the infant was handed over to the awaiting neonatology team. Uterine massage was then administered, and the placenta delivered intact with a three-vessel cord. The uterus was then cleared of clots and debris.    The uterus was exteriorized.  The hysterotomy was closed with 0 Vicryl in a running locked fashion. Incision inspected and hemostatic.The uterus was returned to the abdomen. The pelvis was cleared of all clot and debris. The retractor was removed.  The peritoneum was closed with a 2-0 Vicryl running stitch. The fascia was then closed using 0 Vicryl in a running fashion.  The subcutaneous layer was irrigated and everything was hemostatic . The skin was closed with a 4-0 monocryl subcuticular stitch. The patient tolerated the procedure well. Sponge, instrument and needle counts were correct x 3.  She  was  taken to the recovery room in stable condition.   Milas Hock, MD, FACOG Obstetrician & Gynecologist, [facility] for [facility], [facility]

## 2023-01-10 NOTE — Progress Notes (Signed)
   PRENATAL VISIT NOTE  Subjective:  Deborah Lawson is a 32 y.o. [id number] at [redacted]w[redacted]d being seen today for ongoing prenatal care.  She is currently monitored for the following issues for this high-risk pregnancy and has Language barrier; History of gestational diabetes in prior pregnancy, currently pregnant; History of gestational hypertension; White matter abnormality on MRI of brain; Supervision of high risk pregnancy, antepartum; History of cesarean delivery, currently pregnant; and Female circumcision on their problem list.  Patient reports no complaints.  Contractions: Not present. Vag. Bleeding: None.  Movement: Present. Denies leaking of fluid.   The following portions of the patient's history were reviewed and updated as appropriate: allergies, current medications, past family history, past medical history, past social history, past surgical history and problem list.   Objective:   Vitals:   01/10/23 1338 01/10/23 1407  BP: (!) 144/88 (!) 154/91  Pulse: 92 92  Weight: 206 lb (93.4 kg)     Fetal Status: Fetal Heart Rate (bpm): 150   Movement: Present     General:  Alert, oriented and cooperative. Patient is in no acute distress.  Skin: Skin is warm and dry. No rash noted.   Cardiovascular: Normal heart rate noted  Respiratory: Normal respiratory effort, no problems with respiration noted  Abdomen: Soft, gravid, appropriate for gestational age.  Pain/Pressure: Absent     Pelvic: Cervical exam deferred        Extremities: Normal range of motion.  Edema: None  Mental Status: Normal mood and affect. Normal behavior. Normal judgment and thought content.   Assessment and Plan:  Pregnancy: [id number] at [redacted]w[redacted]d 1. Language barrier Arabic  2. History of gestational hypertension Elevated BP needs MAU evaluation and possible admission  3. Supervision of high risk pregnancy, antepartum   4. History of gestational diabetes in prior pregnancy, currently pregnant   5. History of  cesarean delivery, currently pregnant   Term labor symptoms and general obstetric precautions including but not limited to vaginal bleeding, contractions, leaking of fluid and fetal movement were reviewed in detail with the patient. Please refer to After Visit Summary for other counseling recommendations.   Return in about 1 week (around 01/17/2023).  Future Appointments  Date Time Provider Department Center  01/18/2023  4:15 PM Mora Appl [facility]  02/24/2023 11:10 AM Drema Dallas, DO [facility] None  04/26/2023 10:30 AM Alfonse Spruce, MD [facility] None  12/14/2023  8:30 AM Shamleffer, Konrad Dolores, MD [facility] None    Scheryl Darter, MD

## 2023-01-10 NOTE — Transfer of Care (Signed)
Immediate Anesthesia Transfer of Care Note  Patient: Deborah Lawson  Procedure(s) Performed: CESAREAN SECTION  Patient Location: PACU  Anesthesia Type:Spinal  Level of Consciousness: awake  Airway & Oxygen Therapy: Patient Spontanous Breathing  Post-op Assessment: Report given to RN and Post -op Vital signs reviewed and stable  Post vital signs: Reviewed and stable  Last Vitals:  Vitals Value Taken Time  BP 141/92 01/10/23 2231  Temp    Pulse    Resp 18 01/10/23 2235  SpO2    Vitals shown include unfiled device data.  Last Pain:  Vitals:   01/10/23 1518  TempSrc:   PainSc: 8          Complications: No notable events documented.

## 2023-01-10 NOTE — Anesthesia Preprocedure Evaluation (Addendum)
Anesthesia Evaluation  Patient identified by MRN, date of birth, ID band Patient awake    Reviewed: Allergy & Precautions, NPO status , Patient's Chart, lab work & pertinent test results  Airway Mallampati: II  TM Distance: >3 FB Neck ROM: Full    Dental no notable dental hx. (+) Teeth Intact, Dental Advisory Given   Pulmonary neg pulmonary ROS   Pulmonary exam normal breath sounds clear to auscultation       Cardiovascular hypertension, Normal cardiovascular exam Rhythm:Regular Rate:Normal     Neuro/Psych negative neurological ROS     GI/Hepatic negative GI ROS, Neg liver ROS,,,  Endo/Other  diabetes, Gestational    Renal/GU negative Renal ROS     Musculoskeletal negative musculoskeletal ROS (+)    Abdominal   Peds  Hematology Lab Results      Component                Value               Date                      WBC                      6.9                 01/10/2023                HGB                      12.0                01/10/2023                HCT                      36.9                01/10/2023                MCV                      81.8                01/10/2023                PLT                      308                 01/10/2023            T&S Available   Anesthesia Other Findings All: Porkderived products   Reproductive/Obstetrics (+) Pregnancy                             Anesthesia Physical Anesthesia Plan  ASA: 3  Anesthesia Plan: Spinal   Post-op Pain Management: Regional block* and Minimal or no pain anticipated   Induction:   PONV Risk Score and Plan: 3 and Treatment may vary due to age or medical condition and Ondansetron  Airway Management Planned: Natural Airway and Nasal Cannula  Additional Equipment: None  Intra-op Plan:   Post-operative Plan: Extubation in OR  Informed Consent: I have reviewed the patients History and Physical, chart, labs  and discussed the procedure including the risks, benefits and  alternatives for the proposed anesthesia with the patient or authorized representative who has indicated his/her understanding and acceptance.     Dental advisory given and Interpreter used for interveiw  Plan Discussed with: CRNA  Anesthesia Plan Comments: (38.1 Wk  Arabic Speaking G5P3 w Htn and gDm for Repeat C/S for transverse Lie)       Anesthesia Quick Evaluation

## 2023-01-10 NOTE — Progress Notes (Signed)
Informed consent signed by patient, Dr. Para March, Dr. Richardson Landry, and M. Zackeriah Kissler, Charity fundraiser

## 2023-01-10 NOTE — MAU Provider Note (Signed)
[id number] Deborah Lawson [birth year]  Patient informed that the ultrasound is considered a limited OB ultrasound and is not intended to be a complete ultrasound exam.  Patient also informed that the ultrasound is not being completed with the intent of assessing for fetal or placental anomalies or any pelvic abnormalities.  Explained that the purpose of today's ultrasound is to assess for  presentation.  Patient acknowledges the purpose of the exam and the limitations of the study.  Transverse presentation noted with fetal head maternal left.   Dr. Jaymes Graff notified and at bedside. [place] repeated and cervical exam performed.  Discussed options including external version or c/s. Reviewed risks and benefits of each procedure.   Patient reporting continued leaking. Rom plus ordered.   Cherre Robins, CNM 01/10/2023, 5:57 PM

## 2023-01-10 NOTE — Discharge Summary (Signed)
Postpartum Discharge Summary      Patient Name: Deborah Lawson DOB: 01/05/1991 MRN: [id number]  Date of admission: 01/10/2023 Delivery date:01/10/2023 Delivering provider: Milas Hock Date of discharge: 01/13/2023  Admitting diagnosis: Pregnancy [Z34.90] Intrauterine pregnancy: [redacted]w[redacted]d     Secondary diagnosis:  Principal Problem:   Pregnancy Active Problems:   History of cesarean delivery, currently pregnant   Cesarean delivery delivered   Fetal malpresentation   Gestational hypertension  Additional problems: Gestational hypertension, fetal malpresentation    Discharge diagnosis: Term Pregnancy Delivered and Gestational Hypertension                                              Post partum procedures: none Augmentation: N/A Complications: None  Hospital course: Scheduled C/S   32 y.o. yo [id number] at [redacted]w[redacted]d was admitted to the hospital 01/10/2023 for scheduled cesarean section with the following indication:Malpresentation and gestational hypertension . She had subjective oligo. She declined ECV.  Delivery details are as follows:  Membrane Rupture Time/Date: 9:51 PM,01/10/2023  Delivery Method:C-Section, Low Transverse Operative Delivery:N/A Details of operation can be found in separate operative note.  Patient had a postpartum course complicated by having a POD#1 Hgb of 11.0 (was 12.0 on admit); she received Lasix 20mg  x 7d and was started on Procardia XL 30mg  on POD#2 for mild range BP elevations.  She is ambulating, tolerating a regular diet, passing flatus, and urinating well. Patient is discharged home in stable condition on  01/13/23        Newborn Data: Birth date:01/10/2023 Birth time:9:51 PM Gender:Female Living status:Living Apgars:8 ,9  Weight:3290 g  (7lb 4.1oz)  Magnesium Sulfate received: No BMZ received: No Rhophylac:N/A MMR:N/A T-DaP:Given prenatally Flu: N/A RSV Vaccine received: No Transfusion:No  Immunizations received: Immunization  History  Administered Date(s) Administered   Influenza, Seasonal, Injecte, Preservative Fre 11/26/2022   Influenza,inj,Quad PF,6+ Mos 02/13/2021, 11/09/2021   PFIZER(Purple Top)SARS-COV-2 Vaccination 11/15/2019, 12/14/2019   Rsv, Bivalent, Protein Subunit Rsvpref,pf (Abrysvo) 12/28/2022   Td 11/10/2019   Tdap 11/26/2022    Physical exam  Vitals:   01/12/23 0513 01/12/23 1553 01/12/23 1955 01/13/23 0500  BP: 129/61 128/83 133/82 119/73  Pulse: 90 89 80 92  Resp: 16 16 18 18   Temp: 98.2 F (36.8 C) 98 F (36.7 C) 98.2 F (36.8 C)   TempSrc: Oral Oral Oral   SpO2: 100% 100% 100% 100%   General: alert and cooperative Lochia: appropriate Uterine Fundus: firm Incision: honeycomb stained/changed DVT Evaluation: No evidence of DVT seen on physical exam. Labs: Lab Results  Component Value Date   WBC 12.8 (H) 01/11/2023   HGB 11.0 (L) 01/11/2023   HCT 33.4 (L) 01/11/2023   MCV 82.7 01/11/2023   PLT 276 01/11/2023      Latest Ref Rng & Units 01/10/2023    3:33 PM  CMP  Glucose 70 - 99 mg/dL 347   BUN 6 - 20 mg/dL 8   Creatinine 4.25 - 9.56 mg/dL 3.87   Sodium 564 - 332 mmol/L 134   Potassium 3.5 - 5.1 mmol/L 3.9   Chloride 98 - 111 mmol/L 107   CO2 22 - 32 mmol/L 21   Calcium 8.9 - 10.3 mg/dL 8.8   Total Protein 6.5 - 8.1 g/dL 6.7   Total Bilirubin 0.3 - 1.2 mg/dL 0.2   Alkaline Phos 38 - 126 U/L 138  AST 15 - 41 U/L 24   ALT 0 - 44 U/L 20    Edinburgh Score:    01/11/2023    6:35 PM  Edinburgh Postnatal Depression Scale Screening Tool  I have been able to laugh and see the funny side of things. 0  I have looked forward with enjoyment to things. 0  I have blamed myself unnecessarily when things went wrong. 0  I have been anxious or worried for no good reason. 1  I have felt scared or panicky for no good reason. 0  Things have been getting on top of me. 0  I have been so unhappy that I have had difficulty sleeping. 0  I have felt sad or miserable. 0  I have  been so unhappy that I have been crying. 0  The thought of harming myself has occurred to me. 0  Edinburgh Postnatal Depression Scale Total 1   No data recorded   After visit meds:  Allergies as of 01/13/2023       Reactions   Pork-derived Products    Religious        Medication List     STOP taking these medications    acetaminophen 325 MG tablet Commonly known as: TYLENOL   cyclobenzaprine 10 MG tablet Commonly known as: FLEXERIL   ondansetron 4 MG tablet Commonly known as: ZOFRAN       TAKE these medications    azelastine 0.1 % nasal spray Commonly known as: ASTELIN Place 2 sprays into both nostrils 2 (two) times daily. Use in each nostril as directed   fluticasone 50 MCG/ACT nasal spray Commonly known as: FLONASE Place into both nostrils.   furosemide 20 MG tablet Commonly known as: LASIX Take 1 tablet (20 mg total) by mouth daily.   ibuprofen 600 MG tablet Commonly known as: ADVIL Take 1 tablet (600 mg total) by mouth every 6 (six) hours as needed.   NIFEdipine 30 MG 24 hr tablet Commonly known as: ADALAT CC Take 1 tablet (30 mg total) by mouth at bedtime.   oxyCODONE 5 MG immediate release tablet Commonly known as: Oxy IR/ROXICODONE Take 1-2 tablets (5-10 mg total) by mouth every 4 (four) hours as needed for moderate pain (pain score 4-6).   Prenatal Plus Vitamin/Mineral 27-1 MG Tabs Take 1 tablet by mouth daily.         Discharge home in stable condition Infant Feeding: Bottle Infant Disposition:home with mother Discharge instruction: per After Visit Summary and Postpartum booklet. Activity: Advance as tolerated. Pelvic rest for 6 weeks.  Diet: routine diet Future Appointments: Future Appointments  Date Time Provider Department Center  02/24/2023 11:10 AM Drema Dallas, DO [facility] None  04/26/2023 10:30 AM Alfonse Spruce, MD [facility] None  12/14/2023  8:30 AM Shamleffer, Konrad Dolores, MD [facility] None   Follow up  Visit:  Follow-up Information     [facility] Follow up in 1 week(s).   Specialty: Obstetrics and Gynecology Contact information: [address] [place] [address] [phone]               Message sent to [facility] for postpartum follow up on 10/28 by Dr. Milas Hock (resent msg 10/31 KS)  Please schedule this patient for a In person postpartum visit in 6 weeks with the following provider: Any provider. Additional Postpartum F/U:Incision check 1 week and BP check 1 week  High risk pregnancy complicated by: HTN Delivery mode:  C-Section, Low Transverse Anticipated Birth Control:   interval Cu-IUD   01/13/2023 Arabella Merles, CNM 7:41 AM

## 2023-01-10 NOTE — MAU Note (Signed)
..  Deborah Lawson is a 32 y.o. at [redacted]w[redacted]d here in MAU reporting: sent over from the office for high BP 140's-155's. She currently has a headache. +FM, no vaginal bleeding or LOF  Onset of complaint: 10/28 Pain score: 8/10 Vitals:   01/10/23 1517  BP: (!) 147/83  Pulse: 93  Resp: 16  Temp: 98.6 F (37 C)  SpO2: 100%     FHT:140 Lab orders placed from triage:   Ua

## 2023-01-11 ENCOUNTER — Encounter (HOSPITAL_COMMUNITY): Payer: Self-pay | Admitting: Obstetrics and Gynecology

## 2023-01-11 DIAGNOSIS — O139 Gestational [pregnancy-induced] hypertension without significant proteinuria, unspecified trimester: Secondary | ICD-10-CM | POA: Diagnosis present

## 2023-01-11 LAB — CBC
HCT: 33.4 % — ABNORMAL LOW (ref 36.0–46.0)
Hemoglobin: 11 g/dL — ABNORMAL LOW (ref 12.0–15.0)
MCH: 27.2 pg (ref 26.0–34.0)
MCHC: 32.9 g/dL (ref 30.0–36.0)
MCV: 82.7 fL (ref 80.0–100.0)
Platelets: 276 10*3/uL (ref 150–400)
RBC: 4.04 MIL/uL (ref 3.87–5.11)
RDW: 14.2 % (ref 11.5–15.5)
WBC: 12.8 10*3/uL — ABNORMAL HIGH (ref 4.0–10.5)
nRBC: 0 % (ref 0.0–0.2)

## 2023-01-11 LAB — RPR: RPR Ser Ql: NONREACTIVE

## 2023-01-11 MED ORDER — KETOROLAC TROMETHAMINE 30 MG/ML IJ SOLN
30.0000 mg | Freq: Four times a day (QID) | INTRAMUSCULAR | Status: AC
  Administered 2023-01-11 (×3): 30 mg via INTRAVENOUS
  Filled 2023-01-11 (×4): qty 1

## 2023-01-11 MED ORDER — OXYCODONE HCL 5 MG PO TABS
5.0000 mg | ORAL_TABLET | ORAL | Status: DC | PRN
  Administered 2023-01-12 – 2023-01-13 (×4): 5 mg via ORAL
  Filled 2023-01-11 (×3): qty 1
  Filled 2023-01-11: qty 2

## 2023-01-11 MED ORDER — DIBUCAINE (PERIANAL) 1 % EX OINT: 1.0000 | TOPICAL_OINTMENT | CUTANEOUS | Status: DC | PRN

## 2023-01-11 MED ORDER — WITCH HAZEL-GLYCERIN EX PADS: 1.0000 | MEDICATED_PAD | CUTANEOUS | Status: DC | PRN

## 2023-01-11 MED ORDER — SENNOSIDES-DOCUSATE SODIUM 8.6-50 MG PO TABS
2.0000 | ORAL_TABLET | Freq: Every day | ORAL | Status: DC
Start: 2023-01-11 — End: 2023-01-13
  Administered 2023-01-11 – 2023-01-13 (×3): 2 via ORAL
  Filled 2023-01-11 (×3): qty 2

## 2023-01-11 MED ORDER — ZOLPIDEM TARTRATE 5 MG PO TABS: 5.0000 mg | ORAL_TABLET | Freq: Every evening | ORAL | Status: DC | PRN

## 2023-01-11 MED ORDER — PRENATAL MULTIVITAMIN CH
1.0000 | ORAL_TABLET | Freq: Every day | ORAL | Status: DC
  Administered 2023-01-11 – 2023-01-13 (×3): 1 via ORAL
  Filled 2023-01-11 (×3): qty 1

## 2023-01-11 MED ORDER — OXYTOCIN-SODIUM CHLORIDE 30-0.9 UT/500ML-% IV SOLN
2.5000 [IU]/h | INTRAVENOUS | Status: AC
Start: 2023-01-11 — End: 2023-01-11

## 2023-01-11 MED ORDER — HYDROMORPHONE HCL 1 MG/ML IJ SOLN: 0.2000 mg | INTRAMUSCULAR | Status: DC | PRN

## 2023-01-11 MED ORDER — COCONUT OIL OIL: 1.0000 | TOPICAL_OIL | Status: DC | PRN

## 2023-01-11 MED ORDER — SIMETHICONE 80 MG PO CHEW: 80.0000 mg | CHEWABLE_TABLET | ORAL | Status: DC | PRN

## 2023-01-11 MED ORDER — SIMETHICONE 80 MG PO CHEW
80.0000 mg | CHEWABLE_TABLET | Freq: Three times a day (TID) | ORAL | Status: DC
  Administered 2023-01-11 – 2023-01-13 (×6): 80 mg via ORAL
  Filled 2023-01-11 (×6): qty 1

## 2023-01-11 MED ORDER — MENTHOL 3 MG MT LOZG: 1.0000 | LOZENGE | OROMUCOSAL | Status: DC | PRN

## 2023-01-11 MED ORDER — IBUPROFEN 600 MG PO TABS
600.0000 mg | ORAL_TABLET | Freq: Four times a day (QID) | ORAL | Status: DC
  Administered 2023-01-11 – 2023-01-13 (×7): 600 mg via ORAL
  Filled 2023-01-11 (×7): qty 1

## 2023-01-11 MED ORDER — ACETAMINOPHEN 500 MG PO TABS
1000.0000 mg | ORAL_TABLET | Freq: Four times a day (QID) | ORAL | Status: DC
  Administered 2023-01-11 – 2023-01-13 (×10): 1000 mg via ORAL
  Filled 2023-01-11 (×12): qty 2

## 2023-01-11 MED ORDER — TETANUS-DIPHTH-ACELL PERTUSSIS 5-2.5-18.5 LF-MCG/0.5 IM SUSY: 0.5000 mL | PREFILLED_SYRINGE | Freq: Once | INTRAMUSCULAR | Status: DC

## 2023-01-11 MED ORDER — FUROSEMIDE 20 MG PO TABS
20.0000 mg | ORAL_TABLET | Freq: Every day | ORAL | Status: DC
  Administered 2023-01-11: 20 mg via ORAL
  Filled 2023-01-11 (×2): qty 1

## 2023-01-11 MED ORDER — GABAPENTIN 100 MG PO CAPS
100.0000 mg | ORAL_CAPSULE | Freq: Two times a day (BID) | ORAL | Status: DC
  Administered 2023-01-11 – 2023-01-13 (×6): 100 mg via ORAL
  Filled 2023-01-11 (×6): qty 1

## 2023-01-11 MED ORDER — DIPHENHYDRAMINE HCL 25 MG PO CAPS: 25.0000 mg | ORAL_CAPSULE | Freq: Four times a day (QID) | ORAL | Status: DC | PRN

## 2023-01-11 NOTE — Progress Notes (Signed)
Subjective: Postpartum Day #1: Cesarean Delivery Patient reports incisional pain, tolerating PO, and no problems voiding; bottlefeeding; plans for [place] at [facility] visit; denies dizziness; BPs have been stable   Objective: Vital signs in last 24 hours: Temp:  [98.1 F (36.7 C)-100.1 F (37.8 C)] 98.1 F (36.7 C) (10/29 1136) Pulse Rate:  [56-123] 56 (10/29 1136) Resp:  [14-29] 18 (10/29 1136) BP: (100-162)/(52-139) 117/52 (10/29 1136) SpO2:  [97 %-100 %] 98 % (10/29 1136) Weight:  [93.4 kg] 93.4 kg (10/28 1338)  Physical Exam:  General: alert, cooperative, and no distress Lochia: appropriate Uterine Fundus: firm Incision: honeycomb stained and unchanged DVT Evaluation: No evidence of DVT seen on physical exam.  Recent Labs    01/10/23 1533 01/11/23 0612  HGB 12.0 11.0*  HCT 36.9 33.4*    Assessment/Plan: Status post Cesarean section. Doing well postoperatively.  Continue current care. Continue Lasix x 5d; anticipate d/c 01/12/23.  Arabella Merles, CNM 01/11/2023, 11:41 AM

## 2023-01-11 NOTE — Lactation Note (Signed)
This note was copied from a baby's chart. Lactation Consultation Note  Patient Name: Deborah Lawson [id number] Date: 01/11/2023 Age:30 hours Reason for consult: Initial assessment;Early term 37-38.6wks Arabic Interpreter used [employer] [phone].  Per MOB her feeding choice is breast and formula, she wants to offer formula, 2nd child refused formula so she plans to do both. MOB latched infant on her left breast using the cradle hold position, LC did not assist with latch, MOB is experienced with breastfeeding see maternal data below, infant was still BF after 8 minutes when LC left the room. LC suggested latch infant 1st every feeding and afterwards mom choice then offer formula. MOB knows to BF infant by cues, on demand, every 2-3 hours, skin to skin. MOB knows to call for latch assistance if needed. LC discussed importance of maternal rest, diet and hydration.MOB was made aware of O/P services, breastfeeding support groups, community resources, and our phone # for post-discharge questions.    Maternal Data Does the patient have breastfeeding experience prior to this delivery?: Yes How long did the patient breastfeed?: Per MOB, BF 1st child 3 years, 2nd child 2 years and 3rd child for 36 months , 3rd child is currently 8 years old.  Feeding Mother's Current Feeding Choice: Breast Milk and Formula Nipple Type: Slow - flow  LATCH Score Latch: Grasps breast easily, tongue down, lips flanged, rhythmical sucking.  Audible Swallowing: A few with stimulation  Type of Nipple: Everted at rest and after stimulation  Comfort (Breast/Nipple): Soft / non-tender  Hold (Positioning): No assistance needed to correctly position infant at breast.  LATCH Score: 9   Lactation Tools Discussed/Used    Interventions Interventions: Breast feeding basics reviewed;Skin to skin;Support pillows;Education  Discharge    Consult Status Consult Status: Follow-up Date: 01/11/23 Follow-up type:  In-patient    Frederico Hamman 01/11/2023, 2:46 AM

## 2023-01-12 MED ORDER — NIFEDIPINE ER OSMOTIC RELEASE 30 MG PO TB24
30.0000 mg | ORAL_TABLET | Freq: Every day | ORAL | Status: DC
  Administered 2023-01-12: 30 mg via ORAL
  Filled 2023-01-12: qty 1

## 2023-01-12 MED ORDER — FUROSEMIDE 20 MG PO TABS
20.0000 mg | ORAL_TABLET | Freq: Every day | ORAL | Status: DC
  Administered 2023-01-12 – 2023-01-13 (×2): 20 mg via ORAL
  Filled 2023-01-12 (×2): qty 1

## 2023-01-12 NOTE — Progress Notes (Addendum)
POSTPARTUM PROGRESS NOTE  POD #2  Subjective:  Deborah Lawson is a 32 y.o. [id number] s/p RLTCS at [redacted]w[redacted]d d/t malpresentation and GHTN. No acute events overnight. She reports she is doing well, with some mild itching at incision/dressing site. She denies any problems with ambulating, voiding or po intake. Denies nausea or vomiting. She has passed flatus. Pain is well controlled.  Lochia is minimal.  Objective: Blood pressure 129/61, pulse 90, temperature 98.2 F (36.8 C), temperature source Oral, resp. rate 16, last menstrual period 04/18/2022, SpO2 100%, unknown if currently breastfeeding.  Physical Exam:  General: alert, cooperative and no distress Chest: no respiratory distress Heart: regular rate, distal pulses intact Uterine Fundus: firm, appropriately tender DVT Evaluation: No calf swelling or tenderness Extremities: Minimal LE edema Skin: warm, dry; honeycomb dressing in place with mild blood present  Recent Labs    01/10/23 1533 01/11/23 0612  HGB 12.0 11.0*  HCT 36.9 33.4*    Assessment/Plan: Deborah Lawson is a 31 y.o. [id number] s/p RLTCS at [redacted]w[redacted]d for malpresentation and GHTN.  POD#2 - Doing welll; pain well controlled. H/H appropriate  Routine postpartum care  OOB, ambulated  Contraception: Cu-IUD at [facility] visit Feeding: breast and formula  Dispo: Plan for discharge tomorrow.   LOS: 2 days   Brien Mates, Student-PA 01/12/2023, 7:20 AM

## 2023-01-12 NOTE — Lactation Note (Signed)
This note was copied from a baby's chart. Lactation Consultation Note  Patient Name: Deborah Lawson Today's Date: 01/12/2023 Age:30 hours, experienced P4   Dyad for D/C today, all bottles noted on the doc flow sheets     Consult Status Consult Status: Complete Date: 01/12/23    Kathrin Greathouse 01/12/2023, 2:56 PM

## 2023-01-13 ENCOUNTER — Other Ambulatory Visit (HOSPITAL_COMMUNITY): Payer: Self-pay

## 2023-01-13 MED ORDER — IBUPROFEN 600 MG PO TABS
600.0000 mg | ORAL_TABLET | Freq: Four times a day (QID) | ORAL | 0 refills | Status: DC | PRN
  Filled 2023-01-13: qty 30, 8d supply, fill #0

## 2023-01-13 MED ORDER — OXYCODONE HCL 5 MG PO TABS
5.0000 mg | ORAL_TABLET | ORAL | 0 refills | Status: DC | PRN
  Filled 2023-01-13: qty 30, 3d supply, fill #0

## 2023-01-13 MED ORDER — NIFEDIPINE ER 30 MG PO TB24
30.0000 mg | ORAL_TABLET | Freq: Every day | ORAL | 1 refills | Status: DC
  Filled 2023-01-13: qty 30, 30d supply, fill #0
  Filled 2023-02-21: qty 30, 30d supply, fill #1

## 2023-01-13 MED ORDER — FUROSEMIDE 20 MG PO TABS
20.0000 mg | ORAL_TABLET | Freq: Every day | ORAL | 0 refills | Status: DC
  Filled 2023-01-13: qty 4, 4d supply, fill #0

## 2023-01-13 NOTE — Progress Notes (Signed)
Interpreter Lina Sar was used in arabic so patient really understood

## 2023-01-13 NOTE — Progress Notes (Signed)
Patient refuse interpreter. She states she understands [place]

## 2023-01-17 ENCOUNTER — Other Ambulatory Visit: Payer: Self-pay | Admitting: Advanced Practice Midwife

## 2023-01-18 ENCOUNTER — Encounter: Payer: Medicaid Other | Admitting: Advanced Practice Midwife

## 2023-01-19 ENCOUNTER — Other Ambulatory Visit (HOSPITAL_COMMUNITY): Payer: Self-pay

## 2023-01-19 MED ORDER — IBUPROFEN 600 MG PO TABS
600.0000 mg | ORAL_TABLET | Freq: Four times a day (QID) | ORAL | 0 refills | Status: DC | PRN
  Filled 2023-01-19: qty 30, 8d supply, fill #0

## 2023-01-20 ENCOUNTER — Ambulatory Visit: Payer: Medicaid Other | Admitting: Internal Medicine

## 2023-01-20 ENCOUNTER — Encounter: Payer: Self-pay | Admitting: Internal Medicine

## 2023-01-20 ENCOUNTER — Ambulatory Visit: Payer: Medicaid Other | Admitting: *Deleted

## 2023-01-20 ENCOUNTER — Other Ambulatory Visit: Payer: Self-pay

## 2023-01-20 VITALS — BP 127/88 | HR 95 | Wt 189.0 lb

## 2023-01-20 VITALS — BP 122/80 | HR 88 | Temp 97.5°F | Wt 190.5 lb

## 2023-01-20 DIAGNOSIS — Z7185 Encounter for immunization safety counseling: Secondary | ICD-10-CM

## 2023-01-20 DIAGNOSIS — G8918 Other acute postprocedural pain: Secondary | ICD-10-CM

## 2023-01-20 DIAGNOSIS — Z23 Encounter for immunization: Secondary | ICD-10-CM

## 2023-01-20 DIAGNOSIS — Z0130 Encounter for examination of blood pressure without abnormal findings: Secondary | ICD-10-CM

## 2023-01-20 DIAGNOSIS — K5900 Constipation, unspecified: Secondary | ICD-10-CM

## 2023-01-20 MED ORDER — DOCUSATE SODIUM 100 MG PO CAPS: 100.0000 mg | ORAL_CAPSULE | Freq: Two times a day (BID) | ORAL | 0 refills | Status: DC

## 2023-01-20 MED ORDER — METAMUCIL SMOOTH TEXTURE 58.6 % PO POWD: 1.0000 | Freq: Every day | ORAL | 12 refills | Status: DC | PRN

## 2023-01-20 MED ORDER — IBUPROFEN 600 MG PO TABS: 600.0000 mg | ORAL_TABLET | Freq: Four times a day (QID) | ORAL | 0 refills | Status: DC | PRN

## 2023-01-20 NOTE — Progress Notes (Signed)
Established Patient Office Visit     CC/Reason for Visit: Needs pneumonia vaccine  HPI: Deborah Lawson is a 32 y.o. female who is coming in today for the above mentioned reasons.  She saw her allergist and had titers done for strep pneumonia and was deficient.  She was advised to come see me to consider Prevnar 20.   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 She has never been exposed to tobacco smoke. She has never used smokeless tobacco. She reports that she does not drink alcohol and does not use drugs.  Allergies: Allergies  Allergen Reactions   Pork-Derived Products     Religious    Family History:  Family History  Problem Relation Age of Onset   Allergic rhinitis Mother    Hypertension Father      Current Outpatient Medications:    azelastine (ASTELIN) 0.1 % nasal spray, Place 2 sprays into both nostrils 2 (two) times daily. Use in each nostril as directed, Disp: 30 mL, Rfl: 5   docusate sodium (COLACE) 100 MG capsule, Take 1 capsule (100 mg total) by mouth 2 (two) times daily., Disp: 60 capsule, Rfl: 0   fluticasone (FLONASE) 50 MCG/ACT nasal spray, Place into both nostrils., Disp: , Rfl:    furosemide (LASIX)  20 MG tablet, Take 1 tablet (20 mg total) by mouth daily., Disp: 4 tablet, Rfl: 0   ibuprofen (ADVIL) 600 MG tablet, Take 1 tablet (600 mg total) by mouth every 6 (six) hours as needed., Disp: 30 tablet, Rfl: 0   NIFEdipine (ADALAT CC) 30 MG 24 hr tablet, Take 1 tablet (30 mg total) by mouth at bedtime., Disp: 30 tablet, Rfl: 1   oxyCODONE (OXY IR/ROXICODONE) 5 MG immediate release tablet, Take 1-2 tablets (5-10 mg total) by mouth every 4 (four) hours as needed for moderate pain (pain score 4-6)., Disp: 30 tablet, Rfl: 0   Prenatal Vit-Fe Fumarate-FA (PRENATAL PLUS VITAMIN/MINERAL) 27-1 MG TABS, Take 1 tablet by mouth daily., Disp: 30 tablet, Rfl: 11   psyllium (METAMUCIL SMOOTH TEXTURE) 58.6 % powder, Take 1 packet by mouth daily as needed., Disp: 283 g, Rfl: 12  Review of Systems:  Negative unless indicated in HPI.   Physical Exam: Vitals:   01/20/23 1311  BP: 122/80  Pulse: 88  Temp: (!) 97.5 F (36.4 C)  TempSrc: Oral  SpO2: 99%  Weight: 190 lb 8 oz (86.4 kg)    Body mass index is 31.7 kg/m.   Physical Exam Vitals reviewed.  Constitutional:      Appearance: Normal appearance.  HENT:     Head: Normocephalic and atraumatic.  Eyes:     Conjunctiva/sclera: Conjunctivae normal.     Pupils: Pupils are equal, round,  and reactive to light.  Skin:    General: Skin is warm and dry.  Neurological:     General: No focal deficit present.     Mental Status: She is alert and oriented to person, place, and time.  Psychiatric:        Mood and Affect: Mood normal.        Behavior: Behavior normal.        Thought Content: Thought content normal.        Judgment: Judgment normal.      Impression and Plan:  Need for vaccination for Strep pneumoniae  -Prevnar 20 administered in office today.   Time spent:20 minutes reviewing chart, interviewing and examining patient and formulating plan of care.     Chaya Jan, MD [facility]

## 2023-01-20 NOTE — Addendum Note (Signed)
Addended by: Kern Reap B on: 01/20/2023 02:54 PM   Modules accepted: Orders

## 2023-01-20 NOTE — Progress Notes (Signed)
Here for incision check and bp check s/p repeat C/S 01/10/23. With GHTN. BP 126/95. C/o headache =6,7 once daily but relieved with tylenol or ibuprofen. No edema noted. C/o sometimes sees flashing lights for a few seconds. Repeat BP 127/88. Discussed with Deborah Lawson. No new orders. Incision CDI, Steristrips intact. Removed steristrips,  incison CDI. Reviewed pre-ecclampsia symptoms and wound care. She asked for refill of ibuprofen and meds for constipaton , RX sent for metamuil and colace Reviewed pp appt. Patient voices understanding. [name]

## 2023-01-21 ENCOUNTER — Other Ambulatory Visit (HOSPITAL_COMMUNITY): Payer: Self-pay

## 2023-01-25 ENCOUNTER — Encounter: Payer: Self-pay | Admitting: Obstetrics and Gynecology

## 2023-01-30 ENCOUNTER — Other Ambulatory Visit: Payer: Self-pay | Admitting: Obstetrics and Gynecology

## 2023-01-30 ENCOUNTER — Other Ambulatory Visit: Payer: Self-pay | Admitting: Internal Medicine

## 2023-01-30 DIAGNOSIS — M549 Dorsalgia, unspecified: Secondary | ICD-10-CM

## 2023-01-30 DIAGNOSIS — R0789 Other chest pain: Secondary | ICD-10-CM

## 2023-01-31 ENCOUNTER — Encounter: Payer: Self-pay | Admitting: Neurology

## 2023-02-02 ENCOUNTER — Other Ambulatory Visit (HOSPITAL_COMMUNITY): Payer: Self-pay

## 2023-02-05 ENCOUNTER — Inpatient Hospital Stay (HOSPITAL_COMMUNITY): Payer: Medicaid Other

## 2023-02-05 ENCOUNTER — Encounter (HOSPITAL_COMMUNITY)
Admission: EM | Disposition: A | Discharge status: Home / Self Care | Payer: Self-pay | Source: Home / Self Care | Attending: Emergency Medicine

## 2023-02-05 ENCOUNTER — Inpatient Hospital Stay (EMERGENCY_DEPARTMENT_HOSPITAL): Payer: Medicaid Other | Admitting: Anesthesiology

## 2023-02-05 ENCOUNTER — Other Ambulatory Visit: Payer: Self-pay

## 2023-02-05 ENCOUNTER — Inpatient Hospital Stay (HOSPITAL_COMMUNITY)
Admission: EM | Admit: 2023-02-05 | Discharge: 2023-02-05 | Disposition: A | Discharge status: Home / Self Care | Payer: Medicaid Other | Attending: Obstetrics & Gynecology | Admitting: Obstetrics & Gynecology

## 2023-02-05 ENCOUNTER — Inpatient Hospital Stay (HOSPITAL_COMMUNITY): Payer: Medicaid Other | Admitting: Anesthesiology

## 2023-02-05 ENCOUNTER — Encounter (HOSPITAL_COMMUNITY): Payer: Self-pay

## 2023-02-05 DIAGNOSIS — R1084 Generalized abdominal pain: Secondary | ICD-10-CM | POA: Diagnosis not present

## 2023-02-05 DIAGNOSIS — I10 Essential (primary) hypertension: Secondary | ICD-10-CM | POA: Diagnosis not present

## 2023-02-05 DIAGNOSIS — K3580 Unspecified acute appendicitis: Secondary | ICD-10-CM | POA: Diagnosis not present

## 2023-02-05 DIAGNOSIS — K3533 Acute appendicitis with perforation and localized peritonitis, with abscess: Secondary | ICD-10-CM | POA: Diagnosis not present

## 2023-02-05 HISTORY — PX: LAPAROSCOPIC LYSIS OF ADHESIONS: SHX5905

## 2023-02-05 HISTORY — PX: LAPAROSCOPIC APPENDECTOMY: SHX408

## 2023-02-05 LAB — COMPREHENSIVE METABOLIC PANEL
ALT: 27 U/L (ref 0–44)
AST: 27 U/L (ref 15–41)
Albumin: 3.3 g/dL — ABNORMAL LOW (ref 3.5–5.0)
Alkaline Phosphatase: 77 U/L (ref 38–126)
Anion gap: 10 (ref 5–15)
BUN: 11 mg/dL (ref 6–20)
CO2: 25 mmol/L (ref 22–32)
Calcium: 9 mg/dL (ref 8.9–10.3)
Chloride: 103 mmol/L (ref 98–111)
Creatinine, Ser: 0.64 mg/dL (ref 0.44–1.00)
GFR, Estimated: >60 mL/min (ref >60)
Glucose, Bld: 109 mg/dL — ABNORMAL HIGH (ref 70–99)
Potassium: 5.2 mmol/L — ABNORMAL HIGH (ref 3.5–5.1)
Sodium: 138 mmol/L (ref 135–145)
Total Bilirubin: 0.5 mg/dL (ref <1.2)
Total Protein: 7.6 g/dL (ref 6.5–8.1)

## 2023-02-05 LAB — CBC WITH DIFFERENTIAL/PLATELET
Abs Immature Granulocytes: 0.04 10*3/uL (ref 0.00–0.07)
Basophils Absolute: 0.1 10*3/uL (ref 0.0–0.1)
Basophils Relative: 1 %
Eosinophils Absolute: 0.3 10*3/uL (ref 0.0–0.5)
Eosinophils Relative: 3 %
HCT: 40 % (ref 36.0–46.0)
Hemoglobin: 12.6 g/dL (ref 12.0–15.0)
Immature Granulocytes: 0 %
Lymphocytes Relative: 23 %
Lymphs Abs: 2.5 10*3/uL (ref 0.7–4.0)
MCH: 26 pg (ref 26.0–34.0)
MCHC: 31.5 g/dL (ref 30.0–36.0)
MCV: 82.6 fL (ref 80.0–100.0)
Monocytes Absolute: 0.9 10*3/uL (ref 0.1–1.0)
Monocytes Relative: 8 %
Neutro Abs: 7.1 10*3/uL (ref 1.7–7.7)
Neutrophils Relative %: 65 %
Platelets: 408 10*3/uL — ABNORMAL HIGH (ref 150–400)
RBC: 4.84 MIL/uL (ref 3.87–5.11)
RDW: 14.1 % (ref 11.5–15.5)
WBC: 10.9 10*3/uL — ABNORMAL HIGH (ref 4.0–10.5)
nRBC: 0 % (ref 0.0–0.2)

## 2023-02-05 LAB — POCT PREGNANCY, URINE: Preg Test, Ur: NEGATIVE

## 2023-02-05 SURGERY — APPENDECTOMY, LAPAROSCOPIC
Anesthesia: General | Site: Abdomen

## 2023-02-05 MED ORDER — DEXAMETHASONE SODIUM PHOSPHATE 10 MG/ML IJ SOLN
INTRAMUSCULAR | Status: AC
  Filled 2023-02-05: qty 1

## 2023-02-05 MED ORDER — ACETAMINOPHEN 500 MG PO TABS
1000.0000 mg | ORAL_TABLET | Freq: Once | ORAL | Status: AC
  Administered 2023-02-05: 1000 mg via ORAL
  Filled 2023-02-05: qty 2

## 2023-02-05 MED ORDER — OXYCODONE HCL 5 MG PO TABS
5.0000 mg | ORAL_TABLET | Freq: Once | ORAL | Status: AC | PRN
  Administered 2023-02-05: 5 mg via ORAL

## 2023-02-05 MED ORDER — PIPERACILLIN-TAZOBACTAM 3.375 G IVPB 30 MIN
3.3750 g | Freq: Once | INTRAVENOUS | Status: AC
  Administered 2023-02-05: 3.375 g via INTRAVENOUS
  Filled 2023-02-05: qty 50

## 2023-02-05 MED ORDER — ORAL CARE MOUTH RINSE: 15.0000 mL | Freq: Once | OROMUCOSAL | Status: AC

## 2023-02-05 MED ORDER — BUPIVACAINE-EPINEPHRINE (PF) 0.25% -1:200000 IJ SOLN
30.0000 mL | Freq: Once | INTRAMUSCULAR | Status: AC
  Administered 2023-02-05: 13 mL
  Filled 2023-02-05: qty 30

## 2023-02-05 MED ORDER — IOHEXOL 350 MG/ML SOLN
75.0000 mL | Freq: Once | INTRAVENOUS | Status: AC | PRN
  Administered 2023-02-05: 75 mL via INTRAVENOUS

## 2023-02-05 MED ORDER — OXYCODONE HCL 5 MG PO TABS
ORAL_TABLET | ORAL | Status: AC
  Filled 2023-02-05: qty 1

## 2023-02-05 MED ORDER — CYCLOBENZAPRINE HCL 5 MG PO TABS
10.0000 mg | ORAL_TABLET | Freq: Once | ORAL | Status: AC
  Administered 2023-02-05: 10 mg via ORAL
  Filled 2023-02-05: qty 2

## 2023-02-05 MED ORDER — ONDANSETRON 4 MG PO TBDP
8.0000 mg | ORAL_TABLET | Freq: Once | ORAL | Status: AC
  Administered 2023-02-05: 8 mg via ORAL
  Filled 2023-02-05: qty 2

## 2023-02-05 MED ORDER — ROCURONIUM BROMIDE 10 MG/ML (PF) SYRINGE
PREFILLED_SYRINGE | INTRAVENOUS | Status: DC | PRN
  Administered 2023-02-05: 60 mg via INTRAVENOUS

## 2023-02-05 MED ORDER — DEXAMETHASONE SODIUM PHOSPHATE 10 MG/ML IJ SOLN
INTRAMUSCULAR | Status: DC | PRN
  Administered 2023-02-05: 10 mg via INTRAVENOUS

## 2023-02-05 MED ORDER — CHLORHEXIDINE GLUCONATE 0.12 % MT SOLN
OROMUCOSAL | Status: AC
  Administered 2023-02-05: 15 mL via OROMUCOSAL
  Filled 2023-02-05: qty 15

## 2023-02-05 MED ORDER — DROPERIDOL 2.5 MG/ML IJ SOLN: 0.6250 mg | Freq: Once | INTRAMUSCULAR | Status: DC | PRN

## 2023-02-05 MED ORDER — SODIUM CHLORIDE 0.9 % IR SOLN
Status: DC | PRN
  Administered 2023-02-05: 1000 mL

## 2023-02-05 MED ORDER — FENTANYL CITRATE (PF) 250 MCG/5ML IJ SOLN
INTRAMUSCULAR | Status: DC | PRN
  Administered 2023-02-05 (×3): 50 ug via INTRAVENOUS
  Administered 2023-02-05: 100 ug via INTRAVENOUS

## 2023-02-05 MED ORDER — OXYCODONE HCL 5 MG/5ML PO SOLN: 5.0000 mg | Freq: Once | ORAL | Status: AC | PRN

## 2023-02-05 MED ORDER — LIDOCAINE 2% (20 MG/ML) 5 ML SYRINGE
INTRAMUSCULAR | Status: AC
  Filled 2023-02-05: qty 5

## 2023-02-05 MED ORDER — LIDOCAINE 2% (20 MG/ML) 5 ML SYRINGE
INTRAMUSCULAR | Status: DC | PRN
  Administered 2023-02-05: 80 mg via INTRAVENOUS

## 2023-02-05 MED ORDER — CHLORHEXIDINE GLUCONATE 0.12 % MT SOLN: 15.0000 mL | Freq: Once | OROMUCOSAL | Status: AC

## 2023-02-05 MED ORDER — MEPERIDINE HCL 25 MG/ML IJ SOLN: 6.2500 mg | INTRAMUSCULAR | Status: DC | PRN

## 2023-02-05 MED ORDER — 0.9 % SODIUM CHLORIDE (POUR BTL) OPTIME
TOPICAL | Status: DC | PRN
  Administered 2023-02-05: 1000 mL

## 2023-02-05 MED ORDER — ROCURONIUM BROMIDE 10 MG/ML (PF) SYRINGE
PREFILLED_SYRINGE | INTRAVENOUS | Status: AC
  Filled 2023-02-05: qty 10

## 2023-02-05 MED ORDER — PROPOFOL 10 MG/ML IV BOLUS
INTRAVENOUS | Status: AC
  Filled 2023-02-05: qty 20

## 2023-02-05 MED ORDER — FENTANYL CITRATE (PF) 250 MCG/5ML IJ SOLN
INTRAMUSCULAR | Status: AC
  Filled 2023-02-05: qty 5

## 2023-02-05 MED ORDER — MIDAZOLAM HCL 2 MG/2ML IJ SOLN
INTRAMUSCULAR | Status: AC
  Filled 2023-02-05: qty 2

## 2023-02-05 MED ORDER — HYDROMORPHONE HCL 1 MG/ML IJ SOLN: 0.2500 mg | INTRAMUSCULAR | Status: DC | PRN

## 2023-02-05 MED ORDER — OXYCODONE HCL 5 MG PO TABS: 5.0000 mg | ORAL_TABLET | Freq: Three times a day (TID) | ORAL | 0 refills | Status: DC | PRN

## 2023-02-05 MED ORDER — PROPOFOL 10 MG/ML IV BOLUS
INTRAVENOUS | Status: DC | PRN
  Administered 2023-02-05: 200 mg via INTRAVENOUS

## 2023-02-05 MED ORDER — SUGAMMADEX SODIUM 200 MG/2ML IV SOLN
INTRAVENOUS | Status: DC | PRN
  Administered 2023-02-05: 200 mg via INTRAVENOUS

## 2023-02-05 MED ORDER — ONDANSETRON HCL 4 MG/2ML IJ SOLN
INTRAMUSCULAR | Status: DC | PRN
  Administered 2023-02-05: 4 mg via INTRAVENOUS

## 2023-02-05 MED ORDER — LACTATED RINGERS IV SOLN: INTRAVENOUS | Status: DC

## 2023-02-05 MED ORDER — OXYCODONE HCL 5 MG PO TABS: 5.0000 mg | ORAL_TABLET | Freq: Three times a day (TID) | ORAL | 0 refills | Status: AC | PRN

## 2023-02-05 MED ORDER — ONDANSETRON HCL 4 MG/2ML IJ SOLN
INTRAMUSCULAR | Status: AC
  Filled 2023-02-05: qty 2

## 2023-02-05 SURGICAL SUPPLY — 42 items
APPLIER CLIP 5 13 M/L LIGAMAX5 (MISCELLANEOUS)
BAG COUNTER SPONGE SURGICOUNT (BAG) ×2 IMPLANT
BLADE CLIPPER SURG (BLADE) IMPLANT
CANISTER SUCT 3000ML PPV (MISCELLANEOUS) ×2 IMPLANT
CHLORAPREP W/TINT 26 (MISCELLANEOUS) ×2 IMPLANT
CLIP APPLIE 5 13 M/L LIGAMAX5 (MISCELLANEOUS) IMPLANT
COVER SURGICAL LIGHT HANDLE (MISCELLANEOUS) ×2 IMPLANT
CUTTER FLEX LINEAR 45M (STAPLE) ×2 IMPLANT
DERMABOND ADVANCED .7 DNX12 (GAUZE/BANDAGES/DRESSINGS) ×2 IMPLANT
ELECT REM PT RETURN 9FT ADLT (ELECTROSURGICAL) ×2
ELECTRODE REM PT RTRN 9FT ADLT (ELECTROSURGICAL) ×2 IMPLANT
GLOVE BIO SURGEON STRL SZ7.5 (GLOVE) ×2 IMPLANT
GLOVE INDICATOR 8.0 STRL GRN (GLOVE) ×2 IMPLANT
GOWN STRL REUS W/ TWL LRG LVL3 (GOWN DISPOSABLE) ×4 IMPLANT
GOWN STRL REUS W/ TWL XL LVL3 (GOWN DISPOSABLE) ×2 IMPLANT
IRRIG SUCT STRYKERFLOW 2 WTIP (MISCELLANEOUS) ×2
IRRIGATION SUCT STRKRFLW 2 WTP (MISCELLANEOUS) ×2 IMPLANT
KIT BASIN OR (CUSTOM PROCEDURE TRAY) ×2 IMPLANT
KIT TURNOVER KIT B (KITS) ×2 IMPLANT
NS IRRIG 1000ML POUR BTL (IV SOLUTION) ×2 IMPLANT
PAD ARMBOARD 7.5X6 YLW CONV (MISCELLANEOUS) ×4 IMPLANT
PENCIL SMOKE EVACUATOR (MISCELLANEOUS) ×2 IMPLANT
RELOAD 45 VASCULAR/THIN (ENDOMECHANICALS) IMPLANT
RELOAD STAPLE 45 2.5 WHT GRN (ENDOMECHANICALS) IMPLANT
RELOAD STAPLE 45 3.5 BLU ETS (ENDOMECHANICALS) IMPLANT
RELOAD STAPLE TA45 3.5 REG BLU (ENDOMECHANICALS) ×2 IMPLANT
SCISSORS LAP 5X35 DISP (ENDOMECHANICALS) IMPLANT
SET TUBE SMOKE EVAC HIGH FLOW (TUBING) ×2 IMPLANT
SHEARS HARMONIC ACE PLUS 36CM (ENDOMECHANICALS) ×2 IMPLANT
SLEEVE ADV FIXATION 5X100MM (TROCAR) ×2 IMPLANT
SPECIMEN JAR SMALL (MISCELLANEOUS) ×2 IMPLANT
SUT MNCRL AB 4-0 PS2 18 (SUTURE) ×2 IMPLANT
SYS BAG RETRIEVAL 10MM (BASKET) ×2
SYSTEM BAG RETRIEVAL 10MM (BASKET) ×2 IMPLANT
TOWEL GREEN STERILE (TOWEL DISPOSABLE) ×2 IMPLANT
TOWEL GREEN STERILE FF (TOWEL DISPOSABLE) ×2 IMPLANT
TRAY FOLEY W/BAG SLVR 16FR ST (SET/KITS/TRAYS/PACK) ×2 IMPLANT
TRAY LAPAROSCOPIC MC (CUSTOM PROCEDURE TRAY) ×2 IMPLANT
TROCAR ADV FIXATION 5X100MM (TROCAR) ×2 IMPLANT
TROCAR BALLN 12MMX100 BLUNT (TROCAR) ×2 IMPLANT
WARMER LAPAROSCOPE (MISCELLANEOUS) ×2 IMPLANT
WATER STERILE IRR 1000ML POUR (IV SOLUTION) ×2 IMPLANT

## 2023-02-05 NOTE — Transfer of Care (Signed)
Immediate Anesthesia Transfer of Care Note  Patient: Deborah Lawson  Procedure(s) Performed: APPENDECTOMY LAPAROSCOPIC LAPAROSCOPIC LYSIS OF ADHESIONS (Abdomen)  Patient Location: PACU  Anesthesia Type:General  Level of Consciousness: awake and drowsy  Airway & Oxygen Therapy: Patient Spontanous Breathing and Patient connected to nasal cannula oxygen  Post-op Assessment: Report given to RN and Post -op Vital signs reviewed and stable  Post vital signs: Reviewed and stable  Last Vitals:  Vitals Value Taken Time  BP 96/75   Temp 36.1 C 02/05/23 1403  Pulse 99 02/05/23 1408  Resp 28 02/05/23 1408  SpO2 97 % 02/05/23 1408  Vitals shown include unfiled device data.  Last Pain:  Vitals:   02/05/23 1231  TempSrc:   PainSc: 5          Complications: No notable events documented.

## 2023-02-05 NOTE — Anesthesia Preprocedure Evaluation (Addendum)
Anesthesia Evaluation  Patient identified by MRN, date of birth, ID band Patient awake    Reviewed: Allergy & Precautions, NPO status , Patient's Chart, lab work & pertinent test results  Airway Mallampati: II  TM Distance: >3 FB Neck ROM: Full    Dental no notable dental hx. (+) Teeth Intact, Dental Advisory Given   Pulmonary neg pulmonary ROS   Pulmonary exam normal breath sounds clear to auscultation       Cardiovascular hypertension, Normal cardiovascular exam Rhythm:Regular Rate:Normal     Neuro/Psych negative neurological ROS     GI/Hepatic negative GI ROS, Neg liver ROS,,,  Endo/Other  diabetes, Gestational    Renal/GU negative Renal ROS     Musculoskeletal negative musculoskeletal ROS (+)    Abdominal   Peds  Hematology Lab Results      Component                Value               Date                      WBC                      6.9                 01/10/2023                HGB                      12.0                01/10/2023                HCT                      36.9                01/10/2023                MCV                      81.8                01/10/2023                PLT                      308                 01/10/2023            T&S Available   Anesthesia Other Findings All: Porkderived products   Reproductive/Obstetrics                             Anesthesia Physical Anesthesia Plan  ASA: 3  Anesthesia Plan: General   Post-op Pain Management: Ofirmev IV (intra-op)*   Induction: Rapid sequence, Cricoid pressure planned and Intravenous  PONV Risk Score and Plan: 3 and Treatment may vary due to age or medical condition, Ondansetron, Dexamethasone and Midazolam  Airway Management Planned: Oral ETT  Additional Equipment: None  Intra-op Plan:   Post-operative Plan: Extubation in OR  Informed Consent: I have reviewed the patients History and  Physical, chart, labs and discussed the procedure including the risks, benefits and alternatives for  the proposed anesthesia with the patient or authorized representative who has indicated his/her understanding and acceptance.     Dental advisory given  Plan Discussed with: CRNA  Anesthesia Plan Comments:        Anesthesia Quick Evaluation

## 2023-02-05 NOTE — MAU Note (Signed)
Pt came from [facility] ER- by EMS Says del by C/S on 10-28 Here for abd pain- entire abd hurts- started at 0400. No meds for pain 9/10 At appointment on 11-7-- took off dsg- says incision all good .

## 2023-02-05 NOTE — Discharge Instructions (Addendum)
POST OP INSTRUCTIONS  DIET: As tolerated. Follow a light bland diet the first 24 hours after arrival home, such as soup, liquids, crackers, etc.  Be sure to include lots of fluids daily.  Avoid fast food or heavy meals as your are more likely to get nauseated.  Eat a low fat the next few days after surgery.  Take your usually prescribed home medications unless otherwise directed.  PAIN CONTROL: Pain is best controlled by a usual combination of three different methods TOGETHER: Ice/Heat Over the counter pain medication Prescription pain medication Most patients will experience some swelling and bruising around the surgical site.  Ice packs or heating pads (30-60 minutes up to 6 times a day) will help. Some people prefer to use ice alone, heat alone, alternating between ice & heat.  Experiment to what works for you.  Swelling and bruising can take several weeks to resolve.   It is helpful to take an over-the-counter pain medication regularly for the first few weeks: Ibuprofen (Motrin/Advil) - 200mg  tabs - take 3 tabs (600mg ) every 6 hours as needed for pain Acetaminophen (Tylenol) - you may take 650mg  every 6 hours as needed. You can take this with motrin as they act differently on the body. If you are taking a narcotic pain medication that has acetaminophen in it, do not take over the counter tylenol at the same time.  Iii. NOTE: You may take both of these medications together - most patients  find it most helpful when alternating between the two (i.e. Ibuprofen at 6am,  tylenol at 9am, ibuprofen at 12pm ..Marland Kitchen) A  prescription for pain medication should be given to you upon discharge.  Take your pain medication as prescribed if your pain is not adequatly controlled with the over-the-counter pain reliefs mentioned above.  Avoid getting constipated.  Between the surgery and the pain medications, it is common to experience some constipation.  Increasing fluid intake and taking a fiber supplement (such as  Metamucil, Citrucel, FiberCon, MiraLax, etc) 1-2 times a day regularly will usually help prevent this problem from occurring.  A mild laxative (prune juice, Milk of Magnesia, MiraLax, etc) should be taken according to package directions if there are no bowel movements after 48 hours.    Dressing: Your incisions are covered in Dermabond which is like sterile superglue for the skin. This will come off on it's own in a couple weeks. It is waterproof and you may bathe normally starting the day after your surgery in a shower. Avoid baths/pools/lakes/oceans until your wounds have fully healed.  ACTIVITIES as tolerated:   Avoid heavy lifting (>10lbs or 1 gallon of milk) for the next 6 weeks. You may resume regular (light) daily activities beginning the next day--such as daily self-care, walking, climbing stairs--gradually increasing activities as tolerated.  If you can walk 30 minutes without difficulty, it is safe to try more intense activity such as jogging, treadmill, bicycling, low-impact aerobics.  DO NOT PUSH THROUGH PAIN.  Let pain be your guide: If it hurts to do something, don't do it. You may drive when you are no longer taking prescription pain medication, you can comfortably wear a seatbelt, and you can safely maneuver your car and apply brakes.   FOLLOW UP in our office Please call [facility] at [phone] to set up an appointment to see your surgeon in the office for a follow-up appointment approximately 2 weeks after your surgery. Make sure that you call for this appointment the day you arrive home to  insure a convenient appointment time.  9. If you have disability or family leave forms that need to be completed, you may have them completed by your primary care physician's office; for return to work instructions, please ask our office staff and they will be happy to assist you in obtaining this documentation   When to call us [phone]: Poor pain control Reactions / problems with new  medications (rash/itching, etc)  Fever over 101.5 F (38.5 C) Inability to urinate Nausea/vomiting Worsening swelling or bruising Continued bleeding from incision. Increased pain, redness, or drainage from the incision  The clinic staff is available to answer your questions during regular business hours (8:30am-5pm).  Please don't hesitate to call and ask to speak to one of our nurses for clinical concerns.   A surgeon from [facility] is always on call at the hospitals   If you have a medical emergency, go to the nearest emergency room or call 911.  [facility] [address], [address], [place], [place]  [address] MAIN: [phone] FAX: [phone] [email or web address]

## 2023-02-05 NOTE — Op Note (Signed)
Deborah Lawson [id number]   PRE-OPERATIVE DIAGNOSIS:  Acute appendicitis  POST-OPERATIVE DIAGNOSIS:  Acute appendicitis without perforation or abscess  PROCEDURE:  Laparoscopic appendectomy Lysis of adhesions x 20 minutes  SURGEON:  Marin Olp, MD FACS  ASSISTANT: OR staff  ANESTHESIA: General endotracheal  EBL:   5 mL  DRAINS: None  SPECIMEN:  Appendix  COUNTS:  Sponge, needle and instrument counts were reported correct x2 at conclusion of the operation  DISPOSITION:  PACU in satisfactory condition  COMPLICATIONS: none  FINDINGS: Acutely inflamed appendix consistent with appendicitis.  No perforation or abscess.  No significant purulence or exudative type findings.  Omental containing adhesions across the lower midline presumed from her prior recent C-section.  Appendectomy carried out uneventfully.  DESCRIPTION:   The patient was identified & brought into the operating room. SCDs were in place and functioning. General endotracheal anesthesia was administered. Preoperative antibiotics were administered. The patient was positioned supine with left arm tucked. Hair on the abdomen was then clipped by the OR team. A foley catheter was inserted under sterile conditions. The abdomen was prepped and draped in the standard sterile fashion. A surgical timeout confirmed our plan.  A small incision was made in the supraumbilical skin. The subcutaneous tissue was dissected and the umbilical stalk identified. The stalk was grasped with a Kocher and retracted outwardly. The supraumbilical fascia was exposed and incised. Peritoneal entry was carefully made bluntly. A 0 Vicryl purse-string suture was placed and then the [facility] port was introduced into the abdomen.  CO2 insufflation commenced to . The laparoscope was inserted and confirmed no evidence of trocar site complications. The patient was then positioned in Trendelenburg.  There are omental contain adhesions across  her lower midline stuck to the peritoneum presumably at the site of her recent C-section.  We began by placing 1 additional 5 mm trocar in the left lower quadrant under direct visualization.  The omental adhesions were then evaluated.  There found to contain nothing except omentum.  We began by lysing adhesions carefully using the harmonic scalpel in order to facilitate placement of additional trocars.  These omental adhesions were carefully taken down.  We subsequently were able to identify where the bladder was located in the lower abdomen.  Well above this, 3 fingerbreadths at least above the pubic symphysis, a 5 mm trocar was placed under direct visualization.  We then were able to complete the adhesiolysis taking down all omental containing adhesions across abdominal wall. The bed was then slightly tilted to place the left side down.  The appendix is identified in the right lower quadrant.  There are no significant periappendiceal adhesions but the appendix is enlarged and erythematous.  This is consistent clinically with appendicitis.  There is no perforation or abscess.  There is no exudative type findings around the appendix.  The appendix was elevated.  The base of the appendix was circumferentially dissected taking care to preserve the cecum free of injury. The base was noted to be viable and healthy appearing. The terminal ileum, cecum and ascending colon also appeared normal. The mesoappendix is then divided using the harmonic scalpel, maintaining a plane close to the appendix and terminating at our previously created window. The cut edge of the mesoappendix is observed and hemostatic. The base of the appendix was then stapled with a blue load, taking it flush with the cecum, but also steering clear of the ileocecal valve. The appendix was placed in an EndoBag and removed from the umbilical port  site and passed off as specimen.  The right lower quadrant was conservatively irrigated. Hemostasis was  noted to be achieved - taking time to inspect the ligated mesoappendix. Staple line was noted to be intact on the cecum with well formed staples and no bleeding. The right lower quadrant appeared clean and as such, no drain was placed.  The left lower quadrant and suprapubic ports were removed under direct visualization. The CO2 was exhausted from the abdomen. The umbilical fascia was then closed by tieing the 0 Vicryl suture, obliterating the fascial defect. The fascia was then palpated and noted to be completely closed. The skin of all port sites was approximated using 4-0 Monocryl suture. The abdomen is then washed and dried. The incisions are covered with Dermabond.  She was then awakened from anesthesia, extubated, and transferred to a stretcher for transport to PACU in satisfactory condition.

## 2023-02-05 NOTE — ED Provider Triage Note (Signed)
Emergency Medicine Provider Triage Evaluation Note  Deborah Lawson , a 32 y.o. female  was evaluated in triage.  Pt complains of sudden onset abdominal pain. Symptoms began tonight suddenly with associated vomiting.  No diarrhea or fever.  No chest pain.  Patient arrives by EMS.  Video Arabic interpreter used for this encounter.  Denies vaginal bleeding or discharge. Patient is 3 weeks status post c section with Dr. Para March.  Review of Systems  Positive: Abdominal pain and vomiting Negative: CP, SOB, fever  Physical Exam  BP (!) 135/96 (BP Location: Right Arm)   Pulse 94   Temp 98.3 F (36.8 C) (Oral)   Resp 18   Ht 5\' 5"  (1.651 m)   Wt 88.5 kg   LMP 04/18/2022 (Exact Date)   SpO2 99%   Breastfeeding Yes   BMI 32.45 kg/m  Gen:   Awake, no distress   Resp:  Normal effort  MSK:   Moves extremities without difficulty  Other:  Mild diffuse abdominal pain with well-appearing incision. No peritonitis.   Medical Decision Making  Medically screening exam initiated at 5:26 AM.  Appropriate orders placed.  Deborah Lawson was informed that the remainder of the evaluation will be completed by another provider, this initial triage assessment does not replace that evaluation, and the importance of remaining in the ED until their evaluation is complete.  Patient is 3 weeks postop after cesarean section with Dr. Para March on 10/28.  After initial assessment in the ED I contacted the MAU APP who accepted the patient to MAU to continue workup at that location.  Patient is stable for transfer.   Maia Plan, MD 02/05/23 [phone]

## 2023-02-05 NOTE — ED Triage Notes (Signed)
Pt BIB [facility] from home with generalized abd pain since 4am. Describes pain as grabbing pain. Pt is about 3 weeks postpartum, c-section. Hx of HTN. Denies n/v/d.

## 2023-02-05 NOTE — Anesthesia Procedure Notes (Signed)
Procedure Name: Intubation Date/Time: 02/05/2023 12:53 PM  Performed by: Colbert Coyer, CRNAPre-anesthesia Checklist: Patient identified, Emergency Drugs available, Suction available and Patient being monitored Patient Re-evaluated:Patient Re-evaluated prior to induction Oxygen Delivery Method: Circle System Utilized Preoxygenation: Pre-oxygenation with 100% oxygen Induction Type: IV induction Ventilation: Mask ventilation without difficulty Laryngoscope Size: Mac and 4 Grade View: Grade I Tube type: Oral Tube size: 7.0 mm Number of attempts: 1 Airway Equipment and Method: Stylet and Oral airway Placement Confirmation: ETT inserted through vocal cords under direct vision, positive ETCO2 and breath sounds checked- equal and bilateral Secured at: 22 cm Tube secured with: Tape Dental Injury: Teeth and Oropharynx as per pre-operative assessment

## 2023-02-05 NOTE — MAU Provider Note (Addendum)
History     CSN: [id number]  Arrival date and time: 02/05/23 0501   Event Date/Time   First Provider Initiated Contact with Patient 02/05/23 [phone]      Chief Complaint  Patient presents with   Abdominal Pain    Deborah Lawson is a 32 y.o. [id number] at 3 weeks Postpartum from R C/S who receives care at [facility].  She presents today for abdominal pain.  Patient reports pain started today at 0400.  She describes the pain as cramping that is constant.  She denies relieving or worsening factors that contribute to pain.  She rates the pain a 9/10.  She reports nausea and vomiting prior to arrival.  She denies issues with urination, constipation, or diarrhea. She also reports back pain that she describes as aching.  She states it is worse with standing and has no relieving factors.  She rates the pain a 10/10.  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   Vitamin D deficiency 11/11/2020    Past Surgical History:  Procedure Laterality Date   CESAREAN SECTION N/A 10/26/2019   Procedure: CESAREAN SECTION;  Surgeon: Reva Bores, MD;  Location: [facility] ORS;  Service: Obstetrics;  Laterality: N/A;   CESAREAN SECTION N/A 01/10/2023   Procedure: CESAREAN SECTION;  Surgeon: Milas Hock, MD;  Location: [facility] ORS;  Service: Obstetrics;  Laterality: N/A;   MANDIBLE SURGERY      Family History  Problem Relation Age of Onset   Allergic rhinitis Mother    Hypertension Father     Social History   Tobacco Use   Smoking status: Never    Passive exposure: Never    Smokeless tobacco: Never  Vaping Use   Vaping status: Never Used  Substance Use Topics   Alcohol use: Never   Drug use: Never    Allergies:  Allergies  Allergen Reactions   Pork-Derived Products     Religious    Medications Prior to Admission  Medication Sig Dispense Refill Last Dose   acetaminophen (TYLENOL) 325 MG tablet Take 650 mg by mouth every 6 (six) hours as needed for moderate pain (pain score 4-6).   02/04/2023 at 2100   azelastine (ASTELIN) 0.1 % nasal spray Place 2 sprays into both nostrils 2 (two) times daily. Use in each nostril as directed 30 mL 5 Past Month   ibuprofen (ADVIL) 600 MG tablet Take 1 tablet (600 mg total) by mouth every 6 (six) hours as needed. 30 tablet 0 Past Week   NIFEdipine (ADALAT CC) 30 MG 24 hr tablet Take 1 tablet (30 mg total) by mouth at bedtime. 30 tablet 1 02/04/2023   docusate sodium (COLACE) 100 MG capsule Take 1 capsule (100 mg total) by mouth 2 (two) times daily. 60 capsule 0    fluticasone (FLONASE) 50 MCG/ACT nasal spray Place into both  nostrils.      furosemide (LASIX) 20 MG tablet Take 1 tablet (20 mg total) by mouth daily. 4 tablet 0    loperamide (IMODIUM) 2 MG capsule TAKE 1 TABLET (2 MG TOTAL) BY MOUTH AS NEEDED FOR DIARRHEA OR LOOSE STOOLS. 30 capsule 0    meloxicam (MOBIC) 7.5 MG tablet TAKE 1 TABLET BY MOUTH EVERY DAY 30 tablet 0    oxyCODONE (OXY IR/ROXICODONE) 5 MG immediate release tablet Take 1-2 tablets (5-10 mg total) by mouth every 4 (four) hours as needed for moderate pain (pain score 4-6). 30 tablet 0    Prenatal Vit-Fe Fumarate-FA (PRENATAL PLUS VITAMIN/MINERAL) 27-1 MG TABS Take 1 tablet by mouth daily. 30 tablet 11    psyllium (METAMUCIL SMOOTH TEXTURE) 58.6 % powder Take 1 packet by mouth daily as needed. 283 g 12     Review of Systems  Gastrointestinal:  Positive for abdominal pain, nausea and vomiting (Earlier). Negative for constipation and diarrhea.  Genitourinary:  Negative for difficulty urinating and  dysuria.   Physical Exam   Blood pressure 124/77, pulse 97, temperature 98.3 F (36.8 C), temperature source Oral, resp. rate 18, height 5\' 5"  (1.651 m), weight 86.6 kg, SpO2 100%, currently breastfeeding.  Physical Exam Vitals and nursing note reviewed.  Constitutional:      General: She is in acute distress (During exam).     Appearance: She is well-developed.  HENT:     Head: Normocephalic and atraumatic.  Eyes:     Conjunctiva/sclera: Conjunctivae normal.  Cardiovascular:     Rate and Rhythm: Normal rate and regular rhythm.     Heart sounds: Normal heart sounds.  Pulmonary:     Effort: Pulmonary effort is normal. No respiratory distress.     Breath sounds: Normal breath sounds.  Abdominal:     General: Abdomen is flat.     Palpations: Abdomen is soft.     Tenderness: There is abdominal tenderness in the right lower quadrant.     Comments: Incision well healed and approximated. No tenderness or wellness.   Musculoskeletal:        General: Normal range of motion.     Cervical back: Normal range of motion.  Skin:    General: Skin is warm and dry.  Neurological:     Mental Status: She is alert and oriented to person, place, and time.  Psychiatric:        Mood and Affect: Mood normal.        Behavior: Behavior normal.     MAU Course  Procedures Results for orders placed or performed during the hospital encounter of 02/05/23 (from the past 24 hour(s))  CBC with Differential/Platelet     Status: Abnormal   Collection Time: 02/05/23  7:07 AM  Result Value Ref Range   WBC 10.9 (H) 4.0 - 10.5 K/uL   RBC 4.84 3.87 - 5.11 MIL/uL   Hemoglobin 12.6 12.0 - 15.0 g/dL   HCT 16.1 09.6 - 04.5 %   MCV 82.6 80.0 - 100.0 fL   MCH 26.0 26.0 - 34.0 pg   MCHC 31.5 30.0 - 36.0 g/dL   RDW 40.9 81.1 - 91.4 %   Platelets 408 (H) 150 - 400 K/uL   nRBC 0.0 0.0 - 0.2 %   Neutrophils Relative % 65 %   Neutro Abs 7.1 1.7 - 7.7 K/uL   Lymphocytes Relative 23 %   Lymphs Abs 2.5 0.7 - 4.0  K/uL   Monocytes Relative 8 %  Monocytes Absolute 0.9 0.1 - 1.0 K/uL   Eosinophils Relative 3 %   Eosinophils Absolute 0.3 0.0 - 0.5 K/uL   Basophils Relative 1 %   Basophils Absolute 0.1 0.0 - 0.1 K/uL   Immature Granulocytes 0 %   Abs Immature Granulocytes 0.04 0.00 - 0.07 K/uL  Comprehensive metabolic panel     Status: Abnormal   Collection Time: 02/05/23  7:07 AM  Result Value Ref Range   Sodium 138 135 - 145 mmol/L   Potassium 5.2 (H) 3.5 - 5.1 mmol/L   Chloride 103 98 - 111 mmol/L   CO2 25 22 - 32 mmol/L   Glucose, Bld 109 (H) 70 - 99 mg/dL   BUN 11 6 - 20 mg/dL   Creatinine, Ser 1.61 0.44 - 1.00 mg/dL   Calcium 9.0 8.9 - 09.6 mg/dL   Total Protein 7.6 6.5 - 8.1 g/dL   Albumin 3.3 (L) 3.5 - 5.0 g/dL   AST 27 15 - 41 U/L   ALT 27 0 - 44 U/L   Alkaline Phosphatase 77 38 - 126 U/L   Total Bilirubin 0.5 <1.2 mg/dL   GFR, Estimated >04 >54 mL/min   Anion gap 10 5 - 15   CT ABDOMEN PELVIS W CONTRAST  Result Date: 02/05/2023 CLINICAL DATA:  Right lower quadrant pain.  Vomiting. EXAM: CT ABDOMEN AND PELVIS WITH CONTRAST TECHNIQUE: Multidetector CT imaging of the abdomen and pelvis was performed using the standard protocol following bolus administration of intravenous contrast. RADIATION DOSE REDUCTION: This exam was performed according to the departmental dose-optimization program which includes automated exposure control, adjustment of the mA and/or kV according to patient size and/or use of iterative reconstruction technique. CONTRAST:  75mL OMNIPAQUE IOHEXOL 350 MG/ML SOLN COMPARISON:  05/01/2022 FINDINGS: Lower chest: Unremarkable. Hepatobiliary: No suspicious focal abnormality within the liver parenchyma. There is no evidence for gallstones, gallbladder wall thickening, or pericholecystic fluid. No intrahepatic or extrahepatic biliary dilation. Pancreas: No focal mass lesion. No dilatation of the main duct. No intraparenchymal cyst. No peripancreatic edema. Spleen: No  splenomegaly. No suspicious focal mass lesion. Adrenals/Urinary Tract: No adrenal nodule or mass. Kidneys unremarkable. No evidence for hydroureter. The urinary bladder appears normal for the degree of distention. There is some subtle thickening of the anterior bladder wall (see sagittal 53/7 with stranding in the perivesical fat between the anterior bladder and the low anterior abdominal wall. Findings likely related to the recent C-section. Stomach/Bowel: Stomach is unremarkable. No gastric wall thickening. No evidence of outlet obstruction. Duodenum is normally positioned as is the ligament of Treitz. No small bowel wall thickening. No small bowel dilatation. The terminal ileum is normal. Mid appendix is dilated up to 11 mm diameter. The wall in the base of the appendix and mid appendix is ill-defined in there does appear to be some very subtle periappendiceal edema (coronal image 55/6 and sagittal image 49/7). No gross colonic mass. No colonic wall thickening. Vascular/Lymphatic: No abdominal aortic aneurysm. No abdominal aortic atherosclerotic calcification. There is no gastrohepatic or hepatoduodenal ligament lymphadenopathy. No retroperitoneal or mesenteric lymphadenopathy. No pelvic sidewall lymphadenopathy. Reproductive: Uterus is somewhat prominent in this patient status post C-section just under 4 weeks ago. There is no adnexal mass. Other: No intraperitoneal free fluid. Musculoskeletal: No worrisome lytic or sclerotic osseous abnormality. IMPRESSION: 1. Dilated mid appendix up to 11 mm diameter with ill-defined wall at the base of the appendix and mid appendix. There is a suggestion of some very subtle periappendiceal edema. Imaging features raise concern  for but are not definite for acute appendicitis. 2. Subtle thickening of the anterior bladder wall with stranding in the perivesical fat between the anterior bladder and the low anterior abdominal wall. Findings likely related to the recent C-section.  3. Uterus is somewhat prominent in this patient status post C-section just under 4 weeks ago. Electronically Signed   By: Kennith Center M.D.   On: 02/05/2023 10:00     MDM Exam Labs; CBC/D, CMP Pain Medication Consult CT Scan Assessment and Plan  32 year old Postpartum S/P Repeat C/S RLQ Pain  -Reviewed POC with patient. -Exam performed and findings discussed.  -Patient informed that concern for appendicitis and will need labs and imaging to rule out.  -Labs ordered. -Patient offered and accepts pain medication. Will give flexeril and tylenol.  -Patient offered heat to apply to area as well.  -Dr. Marquis Lunch. A consulted and informed of patient status, evaluation, interventions, and results. Advised: *Proceed with CT for imaging.  -Order placed. -Nurse updated on POC.   Cherre Robins 02/05/2023, 6:48 AM   Reassessment (8:01 AM) -Report given to E. Lyman Bishop, NP for assumption of care.   Cherre Robins MSN, CNM Advanced Practice Provider, [facility]   -Patient returned from CT> Reports improvement in symptoms since receiving medication.  -Ct concerning for appendicitis. Abdominal exam repeated: abdomen soft, mild tenderness throughout, no rebound tenderness on my exam.  -Consulted to general surgery (Dr. Cliffton Asters).     1. Acute appendicitis, unspecified acute appendicitis type   2. Generalized abdominal pain   3. Postpartum state    -Dr. Cliffton Asters has seen patient & will take her to the OR for appendectomy.    Judeth Horn

## 2023-02-05 NOTE — H&P (Signed)
CC: Acute appendicitis  HPI: Deborah Lawson is an 32 y.o. female whom is 3 weeks postpartum from c-sx - presented with now 8hr hx of RLQ pain. Never had this pain before. Reports that the pain began suddenly and is sharp, across lower abdomen. Associated nausea/vomiting. No reported fever/chills. No blood in her stool.  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 She has never been exposed to tobacco smoke. She has never used smokeless tobacco. She reports that she does not drink alcohol and does not use drugs.  Allergies:  Allergies  Allergen Reactions   Pork-Derived Products     Religious    Medications: I have reviewed the patient's current medications.  Results for orders placed or performed during the hospital encounter of 02/05/23 (from the past 48 hour(s))  CBC with Differential/Platelet     Status: Abnormal   Collection Time: 02/05/23  7:07 AM  Result Value Ref Range   WBC 10.9 (H) 4.0 - 10.5 K/uL   RBC 4.84 3.87 - 5.11 MIL/uL   Hemoglobin 12.6 12.0 - 15.0 g/dL   HCT 16.1 09.6 - 04.5 %   MCV 82.6 80.0 - 100.0 fL   MCH 26.0 26.0 - 34.0 pg    MCHC 31.5 30.0 - 36.0 g/dL   RDW 40.9 81.1 - 91.4 %   Platelets 408 (H) 150 - 400 K/uL   nRBC 0.0 0.0 - 0.2 %   Neutrophils Relative % 65 %   Neutro Abs 7.1 1.7 - 7.7 K/uL   Lymphocytes Relative 23 %   Lymphs Abs 2.5 0.7 - 4.0 K/uL   Monocytes Relative 8 %   Monocytes Absolute 0.9 0.1 - 1.0 K/uL   Eosinophils Relative 3 %   Eosinophils Absolute 0.3 0.0 - 0.5 K/uL   Basophils Relative 1 %   Basophils Absolute 0.1 0.0 - 0.1 K/uL   Immature Granulocytes 0 %   Abs Immature Granulocytes 0.04 0.00 - 0.07 K/uL    Comment: Performed at [facility], [address], [place], [place] [address]  Comprehensive metabolic panel     Status: Abnormal   Collection Time: 02/05/23  7:07 AM  Result Value Ref Range   Sodium 138 135 - 145 mmol/L   Potassium 5.2 (H) 3.5 - 5.1 mmol/L   Chloride 103 98 - 111 mmol/L   CO2 25 22 - 32 mmol/L   Glucose, Bld 109 (H) 70 - 99 mg/dL    Comment: Glucose reference range applies only to samples taken after fasting for at least 8 hours.   BUN 11 6 - 20 mg/dL  Creatinine, Ser 0.64 0.44 - 1.00 mg/dL   Calcium 9.0 8.9 - 84.6 mg/dL   Total Protein 7.6 6.5 - 8.1 g/dL   Albumin 3.3 (L) 3.5 - 5.0 g/dL   AST 27 15 - 41 U/L   ALT 27 0 - 44 U/L   Alkaline Phosphatase 77 38 - 126 U/L   Total Bilirubin 0.5 <1.2 mg/dL   GFR, Estimated >96 >29 mL/min    Comment: (NOTE) Calculated using the CKD-EPI Creatinine Equation (2021)    Anion gap 10 5 - 15    Comment: Performed at [facility], [address] [place], [place] [address]    CT ABDOMEN PELVIS W CONTRAST  Result Date: 02/05/2023 CLINICAL DATA:  Right lower quadrant pain.  Vomiting. EXAM: CT ABDOMEN AND PELVIS WITH CONTRAST TECHNIQUE: Multidetector CT imaging of the abdomen and pelvis was performed using the standard protocol following bolus administration of intravenous contrast. RADIATION DOSE REDUCTION: This exam was performed according to the departmental dose-optimization program which includes  automated exposure control, adjustment of the mA and/or kV according to patient size and/or use of iterative reconstruction technique. CONTRAST:  75mL OMNIPAQUE IOHEXOL 350 MG/ML SOLN COMPARISON:  05/01/2022 FINDINGS: Lower chest: Unremarkable. Hepatobiliary: No suspicious focal abnormality within the liver parenchyma. There is no evidence for gallstones, gallbladder wall thickening, or pericholecystic fluid. No intrahepatic or extrahepatic biliary dilation. Pancreas: No focal mass lesion. No dilatation of the main duct. No intraparenchymal cyst. No peripancreatic edema. Spleen: No splenomegaly. No suspicious focal mass lesion. Adrenals/Urinary Tract: No adrenal nodule or mass. Kidneys unremarkable. No evidence for hydroureter. The urinary bladder appears normal for the degree of distention. There is some subtle thickening of the anterior bladder wall (see sagittal 53/7 with stranding in the perivesical fat between the anterior bladder and the low anterior abdominal wall. Findings likely related to the recent C-section. Stomach/Bowel: Stomach is unremarkable. No gastric wall thickening. No evidence of outlet obstruction. Duodenum is normally positioned as is the ligament of Treitz. No small bowel wall thickening. No small bowel dilatation. The terminal ileum is normal. Mid appendix is dilated up to 11 mm diameter. The wall in the base of the appendix and mid appendix is ill-defined in there does appear to be some very subtle periappendiceal edema (coronal image 55/6 and sagittal image 49/7). No gross colonic mass. No colonic wall thickening. Vascular/Lymphatic: No abdominal aortic aneurysm. No abdominal aortic atherosclerotic calcification. There is no gastrohepatic or hepatoduodenal ligament lymphadenopathy. No retroperitoneal or mesenteric lymphadenopathy. No pelvic sidewall lymphadenopathy. Reproductive: Uterus is somewhat prominent in this patient status post C-section just under 4 weeks ago. There is no adnexal  mass. Other: No intraperitoneal free fluid. Musculoskeletal: No worrisome lytic or sclerotic osseous abnormality. IMPRESSION: 1. Dilated mid appendix up to 11 mm diameter with ill-defined wall at the base of the appendix and mid appendix. There is a suggestion of some very subtle periappendiceal edema. Imaging features raise concern for but are not definite for acute appendicitis. 2. Subtle thickening of the anterior bladder wall with stranding in the perivesical fat between the anterior bladder and the low anterior abdominal wall. Findings likely related to the recent C-section. 3. Uterus is somewhat prominent in this patient status post C-section just under 4 weeks ago. Electronically Signed   By: Kennith Center M.D.   On: 02/05/2023 10:00    ROS - all of the below systems have been reviewed with the patient and positives are indicated with bold text General: chills, fever or night  sweats Eyes: blurry vision or double vision ENT: epistaxis or sore throat Allergy/Immunology: itchy/watery eyes or nasal congestion Hematologic/Lymphatic: bleeding problems, blood clots or swollen lymph nodes Endocrine: temperature intolerance or unexpected weight changes Breast: new or changing breast lumps or nipple discharge Resp: cough, shortness of breath, or wheezing CV: chest pain or dyspnea on exertion GI: as per HPI GU: dysuria, trouble voiding, or hematuria MSK: joint pain or joint stiffness Neuro: TIA or stroke symptoms Derm: pruritus and skin lesion changes Psych: anxiety and depression  PE Blood pressure 135/79, pulse 92, temperature 98 F (36.7 C), temperature source Oral, resp. rate 18, height 5\' 5"  (1.651 m), weight 86.6 kg, SpO2 99%, currently breastfeeding. Constitutional: NAD; conversant; no deformities Eyes: Moist conjunctiva; no lid lag; anicteric; PERRL Neck: Trachea midline; no thyromegaly Lungs: Normal respiratory effort; no tactile fremitus CV: RRR; no palpable thrills; no pitting  edema GI: Abd soft, focally tender in RLQ; nondistended MSK: Normal range of motion of extremities Psychiatric: Appropriate affect  Results for orders placed or performed during the hospital encounter of 02/05/23 (from the past 48 hour(s))  CBC with Differential/Platelet     Status: Abnormal   Collection Time: 02/05/23  7:07 AM  Result Value Ref Range   WBC 10.9 (H) 4.0 - 10.5 K/uL   RBC 4.84 3.87 - 5.11 MIL/uL   Hemoglobin 12.6 12.0 - 15.0 g/dL   HCT 47.8 29.5 - 62.1 %   MCV 82.6 80.0 - 100.0 fL   MCH 26.0 26.0 - 34.0 pg   MCHC 31.5 30.0 - 36.0 g/dL   RDW 30.8 65.7 - 84.6 %   Platelets 408 (H) 150 - 400 K/uL   nRBC 0.0 0.0 - 0.2 %   Neutrophils Relative % 65 %   Neutro Abs 7.1 1.7 - 7.7 K/uL   Lymphocytes Relative 23 %   Lymphs Abs 2.5 0.7 - 4.0 K/uL   Monocytes Relative 8 %   Monocytes Absolute 0.9 0.1 - 1.0 K/uL   Eosinophils Relative 3 %   Eosinophils Absolute 0.3 0.0 - 0.5 K/uL   Basophils Relative 1 %   Basophils Absolute 0.1 0.0 - 0.1 K/uL   Immature Granulocytes 0 %   Abs Immature Granulocytes 0.04 0.00 - 0.07 K/uL    Comment: Performed at [facility], [address], [place], [place] [address]  Comprehensive metabolic panel     Status: Abnormal   Collection Time: 02/05/23  7:07 AM  Result Value Ref Range   Sodium 138 135 - 145 mmol/L   Potassium 5.2 (H) 3.5 - 5.1 mmol/L   Chloride 103 98 - 111 mmol/L   CO2 25 22 - 32 mmol/L   Glucose, Bld 109 (H) 70 - 99 mg/dL    Comment: Glucose reference range applies only to samples taken after fasting for at least 8 hours.   BUN 11 6 - 20 mg/dL   Creatinine, Ser 2.84 0.44 - 1.00 mg/dL   Calcium 9.0 8.9 - 13.2 mg/dL   Total Protein 7.6 6.5 - 8.1 g/dL   Albumin 3.3 (L) 3.5 - 5.0 g/dL   AST 27 15 - 41 U/L   ALT 27 0 - 44 U/L   Alkaline Phosphatase 77 38 - 126 U/L   Total Bilirubin 0.5 <1.2 mg/dL   GFR, Estimated >44 >01 mL/min    Comment: (NOTE) Calculated using the CKD-EPI Creatinine Equation (2021)    Anion gap  10 5 - 15    Comment: Performed at [facility]  [facility], [address] [place], [place] [address]    CT ABDOMEN PELVIS W CONTRAST  Result Date: 02/05/2023 CLINICAL DATA:  Right lower quadrant pain.  Vomiting. EXAM: CT ABDOMEN AND PELVIS WITH CONTRAST TECHNIQUE: Multidetector CT imaging of the abdomen and pelvis was performed using the standard protocol following bolus administration of intravenous contrast. RADIATION DOSE REDUCTION: This exam was performed according to the departmental dose-optimization program which includes automated exposure control, adjustment of the mA and/or kV according to patient size and/or use of iterative reconstruction technique. CONTRAST:  75mL OMNIPAQUE IOHEXOL 350 MG/ML SOLN COMPARISON:  05/01/2022 FINDINGS: Lower chest: Unremarkable. Hepatobiliary: No suspicious focal abnormality within the liver parenchyma. There is no evidence for gallstones, gallbladder wall thickening, or pericholecystic fluid. No intrahepatic or extrahepatic biliary dilation. Pancreas: No focal mass lesion. No dilatation of the main duct. No intraparenchymal cyst. No peripancreatic edema. Spleen: No splenomegaly. No suspicious focal mass lesion. Adrenals/Urinary Tract: No adrenal nodule or mass. Kidneys unremarkable. No evidence for hydroureter. The urinary bladder appears normal for the degree of distention. There is some subtle thickening of the anterior bladder wall (see sagittal 53/7 with stranding in the perivesical fat between the anterior bladder and the low anterior abdominal wall. Findings likely related to the recent C-section. Stomach/Bowel: Stomach is unremarkable. No gastric wall thickening. No evidence of outlet obstruction. Duodenum is normally positioned as is the ligament of Treitz. No small bowel wall thickening. No small bowel dilatation. The terminal ileum is normal. Mid appendix is dilated up to 11 mm diameter. The wall in the base of the appendix and mid appendix is ill-defined in  there does appear to be some very subtle periappendiceal edema (coronal image 55/6 and sagittal image 49/7). No gross colonic mass. No colonic wall thickening. Vascular/Lymphatic: No abdominal aortic aneurysm. No abdominal aortic atherosclerotic calcification. There is no gastrohepatic or hepatoduodenal ligament lymphadenopathy. No retroperitoneal or mesenteric lymphadenopathy. No pelvic sidewall lymphadenopathy. Reproductive: Uterus is somewhat prominent in this patient status post C-section just under 4 weeks ago. There is no adnexal mass. Other: No intraperitoneal free fluid. Musculoskeletal: No worrisome lytic or sclerotic osseous abnormality. IMPRESSION: 1. Dilated mid appendix up to 11 mm diameter with ill-defined wall at the base of the appendix and mid appendix. There is a suggestion of some very subtle periappendiceal edema. Imaging features raise concern for but are not definite for acute appendicitis. 2. Subtle thickening of the anterior bladder wall with stranding in the perivesical fat between the anterior bladder and the low anterior abdominal wall. Findings likely related to the recent C-section. 3. Uterus is somewhat prominent in this patient status post C-section just under 4 weeks ago. Electronically Signed   By: Kennith Center M.D.   On: 02/05/2023 10:00     A/P: Deborah Lawson is an 32 y.o. female with acute appendicitis  -The anatomy and physiology of the GI tract was discussed with the patient. The pathophysiology of appendicitis was discussed as well. -We reviewed options moving forward for treatment, covering IV abx vs surgery. We discussed that with antibiotics alone, there is reasonable success in managing appendicitis, however, risks of recurrence at 60yrs being as high as 40% in some studies. We discussed appendectomy - laparoscopic and potential open techniques as well as scenarios where an ileocecectomy could be necessary. We discussed the material risks (including, but  not limited to, pain, bleeding, infection, scarring, need for blood transfusion, damage to surrounding structures- blood vessels/nerves/viscus/organs, damage to ureter/bladder, leak from staple line, need for  additional procedures, hernia, recurrence although quite low, pneumonia, heart attack, stroke, death) benefits and alternatives to surgery were discussed. The patient's questions were answered to her and her mother's satisfaction, they voiced understanding and elected to proceed with surgery. Additionally, we discussed typical postoperative expectations and the recovery process.  Initially she had declined interpreter. After further discussions, asked for interpreter to be present to rediscuss things. All of the above accomplished with the aid of [employer] interpreter services - Arabic - interpreter ID# [phone]  I spent a total of 60 minutes in both face-to-face and non-face-to-face activities, excluding procedures performed, for this visit on the date of this encounter.  Marin Olp, MD [facility], [facility]

## 2023-02-06 ENCOUNTER — Encounter (HOSPITAL_COMMUNITY): Payer: Self-pay | Admitting: Surgery

## 2023-02-07 NOTE — Anesthesia Postprocedure Evaluation (Signed)
Anesthesia Post Note  Patient: Deborah Lawson  Procedure(s) Performed: APPENDECTOMY LAPAROSCOPIC LAPAROSCOPIC LYSIS OF ADHESIONS (Abdomen)     Patient location during evaluation: PACU Anesthesia Type: General Level of consciousness: sedated and patient cooperative Pain management: pain level controlled Vital Signs Assessment: post-procedure vital signs reviewed and stable Respiratory status: spontaneous breathing Cardiovascular status: stable Anesthetic complications: no   No notable events documented.  Last Vitals:  Vitals:   02/05/23 1430 02/05/23 1445  BP: 134/71 123/82  Pulse: 94 92  Resp: 20 18  Temp:  36.4 C  SpO2: 98% 99%    Last Pain:  Vitals:   02/05/23 1430  TempSrc:   PainSc: 4                  [name] [employer]

## 2023-02-10 ENCOUNTER — Emergency Department (HOSPITAL_COMMUNITY)
Admission: EM | Admit: 2023-02-10 | Discharge: 2023-02-10 | Disposition: A | Discharge status: Home / Self Care | Payer: Medicaid Other | Attending: Emergency Medicine | Admitting: Emergency Medicine

## 2023-02-10 ENCOUNTER — Other Ambulatory Visit: Payer: Self-pay

## 2023-02-10 ENCOUNTER — Emergency Department (HOSPITAL_COMMUNITY): Payer: Medicaid Other

## 2023-02-10 ENCOUNTER — Encounter (HOSPITAL_COMMUNITY): Payer: Self-pay

## 2023-02-10 DIAGNOSIS — N3000 Acute cystitis without hematuria: Secondary | ICD-10-CM | POA: Diagnosis not present

## 2023-02-10 DIAGNOSIS — K5903 Drug induced constipation: Secondary | ICD-10-CM

## 2023-02-10 DIAGNOSIS — K5900 Constipation, unspecified: Secondary | ICD-10-CM | POA: Diagnosis present

## 2023-02-10 LAB — COMPREHENSIVE METABOLIC PANEL
ALT: 34 U/L (ref 0–44)
AST: 25 U/L (ref 15–41)
Albumin: 3.1 g/dL — ABNORMAL LOW (ref 3.5–5.0)
Alkaline Phosphatase: 81 U/L (ref 38–126)
Anion gap: 8 (ref 5–15)
BUN: 12 mg/dL (ref 6–20)
CO2: 24 mmol/L (ref 22–32)
Calcium: 8.7 mg/dL — ABNORMAL LOW (ref 8.9–10.3)
Chloride: 104 mmol/L (ref 98–111)
Creatinine, Ser: 0.55 mg/dL (ref 0.44–1.00)
GFR, Estimated: >60 mL/min (ref >60)
Glucose, Bld: 92 mg/dL (ref 70–99)
Potassium: 3.9 mmol/L (ref 3.5–5.1)
Sodium: 136 mmol/L (ref 135–145)
Total Bilirubin: 0.5 mg/dL (ref <1.2)
Total Protein: 6.9 g/dL (ref 6.5–8.1)

## 2023-02-10 LAB — CBC WITH DIFFERENTIAL/PLATELET
Abs Immature Granulocytes: 0.02 10*3/uL (ref 0.00–0.07)
Basophils Absolute: 0 10*3/uL (ref 0.0–0.1)
Basophils Relative: 0 %
Eosinophils Absolute: 0.6 10*3/uL — ABNORMAL HIGH (ref 0.0–0.5)
Eosinophils Relative: 7 %
HCT: 37.4 % (ref 36.0–46.0)
Hemoglobin: 11.6 g/dL — ABNORMAL LOW (ref 12.0–15.0)
Immature Granulocytes: 0 %
Lymphocytes Relative: 44 %
Lymphs Abs: 3.7 10*3/uL (ref 0.7–4.0)
MCH: 25.7 pg — ABNORMAL LOW (ref 26.0–34.0)
MCHC: 31 g/dL (ref 30.0–36.0)
MCV: 82.9 fL (ref 80.0–100.0)
Monocytes Absolute: 0.5 10*3/uL (ref 0.1–1.0)
Monocytes Relative: 6 %
Neutro Abs: 3.7 10*3/uL (ref 1.7–7.7)
Neutrophils Relative %: 43 %
Platelets: 379 10*3/uL (ref 150–400)
RBC: 4.51 MIL/uL (ref 3.87–5.11)
RDW: 14.1 % (ref 11.5–15.5)
WBC: 8.6 10*3/uL (ref 4.0–10.5)
nRBC: 0 % (ref 0.0–0.2)

## 2023-02-10 LAB — URINALYSIS, ROUTINE W REFLEX MICROSCOPIC
Bilirubin Urine: NEGATIVE
Glucose, UA: NEGATIVE mg/dL
Ketones, ur: NEGATIVE mg/dL
Nitrite: NEGATIVE
Protein, ur: NEGATIVE mg/dL
Specific Gravity, Urine: 1.006 (ref 1.005–1.030)
pH: 6 (ref 5.0–8.0)

## 2023-02-10 LAB — HCG, QUANTITATIVE, PREGNANCY: hCG, Beta Chain, Quant, S: 1 m[IU]/mL (ref <5)

## 2023-02-10 MED ORDER — IOHEXOL 350 MG/ML SOLN
75.0000 mL | Freq: Once | INTRAVENOUS | Status: AC | PRN
  Administered 2023-02-10: 75 mL via INTRAVENOUS

## 2023-02-10 MED ORDER — SODIUM CHLORIDE 0.9 % IV SOLN
1.0000 g | Freq: Once | INTRAVENOUS | Status: AC
  Administered 2023-02-10: 1 g via INTRAVENOUS
  Filled 2023-02-10: qty 10

## 2023-02-10 MED ORDER — CEFDINIR 300 MG PO CAPS: 300.0000 mg | ORAL_CAPSULE | Freq: Two times a day (BID) | ORAL | 0 refills | Status: AC

## 2023-02-10 NOTE — Discharge Instructions (Addendum)
You were seen in the ER for evaluation of your urinary symptoms.  Your lab work shows that you have a urinary tract infection.  Because of this, going to start you on an antibiotic that you will take twice a day for the next 7 days.  It is safe in breast-feeding.  Please make sure you follow-up with your surgeon and your primary care provider.  I will he think the narcotics you are taking is what is causing her constipation.  Make sure you are adding on MiraLAX to help with this and make sure you are staying well-hydrated drink plenty of fluids, mainly water.  If you have any concerns, new or worsening symptoms, please return to your nearest emergency department for evaluation.  ??? ??? ????? ?? ???? ??????? ?????? ??????? ??????? ????.  ????? ???? ??????? ?? ???? ???? ?? ??????? ???????.  ????? ?????? ????? ?????? ???? ???? ???????? ????? ?????? ???? 7 ????.  ???? ???? ?? ??????? ????????.  ???? ?????? ?? ???????? ?? ?????? ????? ??????? ??????? ????? ??.  ??? ????? ?? ???????? ???? ???????? ?? ???? ???? ??? ???????.  ???? ?? ????? MiraLAX ???????? ?? ??? ????? ?? ??? ????? ??? ????? ???? ???? ?????? ?? ???????? ????? ?????.  ??? ???? ???? ??? ????? ?? ????? ????? ?? ???????? ????? ?????? ??? ???? ??? ????? ????????. laqad tamat [place] fi ghurfat altawari litaqyim al'aerad albawliat ladayki. yuzhr eamaluk almaemaliu 'ana ladayk eadwaa fi almasalik albawliati. walihadha alsababi, sa'abda bitanawul mudadin hayawiin satatanawaluh maratayn ywmyan limudat 7 'ayami. 'anaha aminat fi alradaeat altabieiati. yurjaa alta'akud min almutabaeat mae aljirah wamuqadam alrieayat al'awaliat [employer]. sawf yaetaqid 'ana almukhadirat alati tatanawaluha hi alati tasabab laha al'iimsaki. ta'akad min 'iidafat MiraLAX lilmusaeadat fi dhalik wata'akad min 'anak tuhafiz ealaa rutubat jismik washurb alkathir min alsawayil, wakhasatan alma'a. 'iidha kanat ladayk 'Tonimarie makhawif 'aw 'aerad jadidat 'aw mutafaqimatin, fayurjaa aleawdat  'iilaa 'aqrab qism tawari litaqyimiha.

## 2023-02-10 NOTE — ED Triage Notes (Signed)
Arabic interpreter used for triage:  Pt c.o pain with urination x2 days and constipation. States her last Bm was today but was harder to pass. Pt is 1 month PP via c section.

## 2023-02-10 NOTE — ED Provider Notes (Addendum)
[place] EMERGENCY DEPARTMENT AT [facility] Provider Note   CSN: [id number] Arrival date & time: 02/10/23  1247     History Chief Complaint  Patient presents with   Dysuria   Constipation    Deborah Lawson is a 32 y.o. female with recent appendectomy and C-section presents to the ER for evaluation of her dysuria. The patient reports that this has been going on for 2 days.  She endorses urgency and frequency with this as well.  Reports that she has been having abdominal pain since her appendectomy.  Mainly in the suprapubic and left lower quadrant area.  She is also make limiting about constipation however has been increasing her pain medication use.  Is still bit a pass gas.  No nausea or vomiting.  Denies any fevers.  She denies any vaginal bleeding or any vaginal discharge.  She does report some back pain however is at its chronic state.  No midline.  Denies any hematochezia, melena, or hematuria.  She did have a bowel movement today that was small and hard.  She recently had an appendectomy a few days ago and also had a C-section last month.  She denies any allergies to the medications.  Denies any tobacco, EtOH, other drug use.   Dysuria Associated symptoms: abdominal pain   Associated symptoms: no fever, no flank pain, no nausea, no vaginal discharge and no vomiting   Constipation Associated symptoms: abdominal pain and dysuria   Associated symptoms: no fever, no nausea and no vomiting        Home Medications Prior to Admission medications   Medication Sig Start Date End Date Taking? Authorizing Provider  acetaminophen (TYLENOL) 325 MG tablet Take 650 mg by mouth every 6 (six) hours as needed for moderate pain (pain score 4-6).    [provider]  azelastine (ASTELIN) 0.1 % nasal spray Place 2 sprays into both nostrils 2 (two) times daily. Use in each nostril as directed 12/28/22   Alfonse Spruce, MD  docusate sodium (COLACE) 100 MG  capsule Take 1 capsule (100 mg total) by mouth 2 (two) times daily. 01/20/23   Carlynn Herald, CNM  fluticasone (FLONASE) 50 MCG/ACT nasal spray Place into both nostrils. 12/21/22   [provider]  furosemide (LASIX) 20 MG tablet Take 1 tablet (20 mg total) by mouth daily. 01/13/23   Arabella Merles, CNM  ibuprofen (ADVIL) 600 MG tablet Take 1 tablet (600 mg total) by mouth every 6 (six) hours as needed. 01/20/23   Carlynn Herald, CNM  loperamide (IMODIUM) 2 MG capsule TAKE 1 TABLET (2 MG TOTAL) BY MOUTH AS NEEDED FOR DIARRHEA OR LOOSE STOOLS. 02/02/23   Sue Lush, FNP  meloxicam (MOBIC) 7.5 MG tablet TAKE 1 TABLET BY MOUTH EVERY DAY 01/31/23   Philip Aspen, Limmie Patricia, MD  NIFEdipine (ADALAT CC) 30 MG 24 hr tablet Take 1 tablet (30 mg total) by mouth at bedtime. 01/13/23   Arabella Merles, CNM  oxyCODONE (ROXICODONE) 5 MG immediate release tablet Take 1 tablet (5 mg total) by mouth every 8 (eight) hours as needed. 02/05/23 02/05/24  Andria Meuse, MD  oxyCODONE (ROXICODONE) 5 MG immediate release tablet Take 1 tablet (5 mg total) by mouth every 8 (eight) hours as needed for up to 5 days (postop pain not controlled with tylenol and ibuprofen first). 02/05/23 02/10/23  Andria Meuse, MD  Prenatal Vit-Fe Fumarate-FA (PRENATAL PLUS VITAMIN/MINERAL) 27-1 MG TABS Take 1 tablet by  mouth daily. 07/07/22   Katrinka Blazing, [place], CNM  psyllium (METAMUCIL SMOOTH TEXTURE) 58.6 % powder Take 1 packet by mouth daily as needed. 01/20/23   Carlynn Herald, CNM      Allergies    Pork-derived products    Review of Systems   Review of Systems  Constitutional:  Negative for chills and fever.  Respiratory:  Negative for shortness of breath.   Cardiovascular:  Negative for chest pain.  Gastrointestinal:  Positive for abdominal pain and constipation. Negative for nausea and vomiting.  Genitourinary:  Positive for dysuria, frequency and urgency. Negative for flank pain,  hematuria, vaginal bleeding, vaginal discharge and vaginal pain.    Physical Exam Updated Vital Signs BP 117/78 (BP Location: Right Arm)   Pulse 70   Temp 97.8 F (36.6 C)   Resp 16   SpO2 98%  Physical Exam Vitals and nursing note reviewed.  Constitutional:      General: She is not in acute distress.    Appearance: She is not ill-appearing or toxic-appearing.  HENT:     Mouth/Throat:     Mouth: Mucous membranes are moist.  Eyes:     General: No scleral icterus. Cardiovascular:     Rate and Rhythm: Normal rate.  Pulmonary:     Effort: Pulmonary effort is normal. No respiratory distress.  Abdominal:     Palpations: Abdomen is soft.     Tenderness: There is abdominal tenderness. There is no right CVA tenderness, left CVA tenderness, guarding or rebound.     Comments: Some mild suprapubic and left lower quadrant tenderness location.  There is an incision in the suprapubic, upper LUQ and periumbilical region.  Surgical glue still intact.  No surrounding erythema or discharge.  No surrounding induration or fluctuance present as well.  Abdomen is soft.  No guarding or rebound noted.  Normal active bowel sounds.  Musculoskeletal:     Right lower leg: No edema.     Left lower leg: No edema.     Comments: Some lower very paraspinal lumbar tenderness palpation.  No midline tenderness palpation.  Skin:    General: Skin is warm and dry.  Neurological:     Mental Status: She is alert.     ED Results / Procedures / Treatments   Labs (all labs ordered are listed, but only abnormal results are displayed) Labs Reviewed  URINALYSIS, ROUTINE W REFLEX MICROSCOPIC - Abnormal; Notable for the following components:      Result Value   APPearance HAZY (*)    Hgb urine dipstick MODERATE (*)    Leukocytes,Ua LARGE (*)    Bacteria, UA RARE (*)    Non Squamous Epithelial 0-5 (*)    All other components within normal limits  CBC WITH DIFFERENTIAL/PLATELET - Abnormal; Notable for the following  components:   Hemoglobin 11.6 (*)    MCH 25.7 (*)    Eosinophils Absolute 0.6 (*)    All other components within normal limits  COMPREHENSIVE METABOLIC PANEL - Abnormal; Notable for the following components:   Calcium 8.7 (*)    Albumin 3.1 (*)    All other components within normal limits  URINE CULTURE  HCG, QUANTITATIVE, PREGNANCY    EKG None  Radiology CT ABDOMEN PELVIS W CONTRAST  Result Date: 02/10/2023 CLINICAL DATA:  Abdominal pain, acute, nonlocalized recent c-section and appendectomy, now having abdominal pain and dysuria Pt c.o pain with urination x2 days and constipation. EXAM: CT ABDOMEN AND PELVIS WITH CONTRAST TECHNIQUE: Multidetector CT imaging of  the abdomen and pelvis was performed using the standard protocol following bolus administration of intravenous contrast. RADIATION DOSE REDUCTION: This exam was performed according to the departmental dose-optimization program which includes automated exposure control, adjustment of the mA and/or kV according to patient size and/or use of iterative reconstruction technique. CONTRAST:  75mL OMNIPAQUE IOHEXOL 350 MG/ML SOLN COMPARISON:  None Available. FINDINGS: Lower chest: No acute abnormality. Hepatobiliary: No focal liver abnormality. No gallstones, gallbladder wall thickening, or pericholecystic fluid. No biliary dilatation. Pancreas: No focal lesion. Normal pancreatic contour. No surrounding inflammatory changes. No main pancreatic ductal dilatation. Spleen: Normal in size without focal abnormality. Adrenals/Urinary Tract: No adrenal nodule bilaterally. Bilateral kidneys enhance symmetrically. No hydronephrosis. No hydroureter. The urinary bladder is unremarkable. Stomach/Bowel: Stomach is within normal limits. No evidence of bowel wall thickening or dilatation. Stool within the ascending colon and transverse colon. Stool within the rectum. Status post appendectomy. Vascular/Lymphatic: No abdominal aorta or iliac aneurysm. No  abdominal, pelvic, or inguinal lymphadenopathy. Reproductive: Uterus and bilateral adnexa are unremarkable. Other: No intraperitoneal free fluid. No intraperitoneal free gas. No organized fluid collection. Musculoskeletal: No abdominal wall hernia or abnormality. Healing lower anterior abdominal incision. No suspicious lytic or blastic osseous lesions. No acute displaced fracture. IMPRESSION: No acute intra-abdominal or intrapelvic abnormality in a patient status post C-section and appendectomy. Electronically Signed   By: Tish Frederickson M.D.   On: 02/10/2023 20:31    Procedures Procedures   Medications Ordered in ED Medications  cefTRIAXone (ROCEPHIN) 1 g in sodium chloride 0.9 % 100 mL IVPB (1 g Intravenous Bolus 02/10/23 1940)  iohexol (OMNIPAQUE) 350 MG/ML injection 75 mL (75 mLs Intravenous Contrast Given 02/10/23 2022)    ED Course/ Medical Decision Making/ A&P   Medical Decision Making Amount and/or Complexity of Data Reviewed Labs: ordered. Radiology: ordered.  Risk Prescription drug management.   32 y.o. female presents to the ER for evaluation of dysuria. Differential diagnosis includes but is not limited to postop complication, UTI, PID, interstitial cystitis. Vital signs unremarkable. Physical exam as noted above.   On previous chart evaluation, patient had cesarean on 01-10-2023 and had an appendectomy on 02-05-2023.  She is not taking narcotic pain medication for her lower abdominal pain and does not complaining of constipation.  Still able to pass gas.  I independently reviewed and interpreted the patient's labs.  CBC shows mild anemia with hemoglobin 11.6 slightly lower than previous 1 a month ago at 12.6 however does appear around patient's baseline.  No leukocytosis.  CMP shows mildly decreased calcium and albumin otherwise no electrolyte or LFT abnormalities.  hCG is negative.  Urinalysis shows hazy urine with mod amount of hemoglobin present with large amount of  leukocytes, 1120 white blood cells, and rare bacteria present.  There is no red blood cells seen.  Urine culture pending.  Given the abdominal tenderness as well as recent appendectomy and cesarean with the dysuria, I am concerned that this could be some underlying etiology causing this other than a UTI, we will proceed with CT imaging.  CT imaging shows No acute intra-abdominal or intrapelvic abnormality in a patient status post C-section and appendectomy. Per radiologist's interpretation.   Given that dysuria was her main complaint, likely this is a UTI.  The patient was given a gram of Rocephin for her UTI symptoms.  [facility] about UTI medications given previous cultures.  We agreed upon Omnicef twice daily for the next 7 days as this is safe in  breastfeeding.  She does not have any CVA tenderness palpation, doubt any pyelonephritis nor is or any seen on CT imaging.  She is afebrile with a normal heart rate.  Given her dysuria, this is likely a UTI.  Could have had a catheterization or with recent childbirth could have been causing her UTIs.  She is not having any vaginal bleeding or vaginal discharge, I do not think any pelvic exam is necessary at this time.  Encouraged her to follow-up with her OB/GYN, surgeon, PCP.  Her incisions are well-appearing with the surgical glue still intact.  There is no surrounding erythema, fluctuance, or induration.  She does have some minor suprapubic and left lower quadrant belly pain however abdomen remains soft without any guarding or rebound.  She has normal active bowel sounds.  Given her constipation, I did recommend she take some MiraLAX for her symptoms.  Encouraged staying hydrated with oral hydration as well.  We discussed the results of the labs/imaging. The plan is . We discussed strict return precautions and red flag symptoms. The patient verbalized their understanding and agrees to the plan. The patient is stable and being  discharged home in good condition.  Portions of this report may have been transcribed using voice recognition software. Every effort was made to ensure accuracy; however, inadvertent computerized transcription errors may be present.   Translator used during this encounter. Final Clinical Impression(s) / ED Diagnoses Final diagnoses:  Acute cystitis without hematuria  Drug-induced constipation    Rx / DC Orders ED Discharge Orders          Ordered    cefdinir (OMNICEF) 300 MG capsule  2 times daily        02/10/23 2103              Achille Rich, PA-C 02/10/23 2136    Achille Rich, PA-C 02/10/23 2139    Laurence Spates, MD 02/11/23 2253

## 2023-02-13 LAB — URINE CULTURE: Culture: >=100000 — AB

## 2023-02-14 ENCOUNTER — Telehealth (HOSPITAL_BASED_OUTPATIENT_CLINIC_OR_DEPARTMENT_OTHER): Payer: Self-pay | Admitting: *Deleted

## 2023-02-14 ENCOUNTER — Other Ambulatory Visit: Payer: Self-pay | Admitting: Internal Medicine

## 2023-02-14 ENCOUNTER — Other Ambulatory Visit: Payer: Self-pay | Admitting: Certified Nurse Midwife

## 2023-02-14 DIAGNOSIS — G8918 Other acute postprocedural pain: Secondary | ICD-10-CM

## 2023-02-14 NOTE — Telephone Encounter (Signed)
Post ED Visit - Positive Culture Follow-up  Culture report reviewed by antimicrobial stewardship pharmacist: [facility] []  Enzo Bi, Pharm.D. []  Celedonio Miyamoto, Pharm.D., BCPS AQ-ID []  Garvin Fila, Pharm.D., BCPS []  Georgina Pillion, Pharm.D., BCPS []  Savage, [address].D., BCPS, AAHIVP []  Estella Husk, Pharm.D., BCPS, AAHIVP []  Lysle Pearl, PharmD, BCPS []  Phillips Climes, PharmD, BCPS []  Agapito Games, PharmD, BCPS []  Verlan Friends, PharmD []  Mervyn Gay, PharmD, BCPS [x]  l, [facility] []  Len Childs, PharmD []  Greer Pickerel, PharmD []  Adalberto Cole, PharmD []  Perlie Gold, Rph []  Lonell Face) Jean Rosenthal, PharmD []  Earl Many, PharmD []  Junita Push, PharmD []  Dorna Leitz, PharmD []  Terrilee Files, PharmD []  Lynann Beaver, PharmD []  Keturah Barre, PharmD []  Loralee Pacas, PharmD []  Bernadene Person, PharmD   Positive urine culture Treated with Cefdinir, organism sensitive to the same and no further patient follow-up is required at this time.  Patsey Berthold 02/14/2023, 7:52 AM

## 2023-02-16 LAB — SURGICAL PATHOLOGY

## 2023-02-17 ENCOUNTER — Other Ambulatory Visit: Payer: Self-pay

## 2023-02-17 ENCOUNTER — Ambulatory Visit: Payer: Medicaid Other | Admitting: Obstetrics and Gynecology

## 2023-02-17 ENCOUNTER — Other Ambulatory Visit: Payer: Self-pay | Admitting: Obstetrics and Gynecology

## 2023-02-17 ENCOUNTER — Other Ambulatory Visit: Payer: Self-pay | Admitting: Advanced Practice Midwife

## 2023-02-17 ENCOUNTER — Other Ambulatory Visit (HOSPITAL_COMMUNITY)
Admission: RE | Admit: 2023-02-17 | Discharge: 2023-02-17 | Disposition: A | Discharge status: Home / Self Care | Payer: Medicaid Other | Source: Ambulatory Visit | Attending: Obstetrics and Gynecology | Admitting: Obstetrics and Gynecology

## 2023-02-17 ENCOUNTER — Encounter: Payer: Self-pay | Admitting: Obstetrics and Gynecology

## 2023-02-17 DIAGNOSIS — Z3009 Encounter for other general counseling and advice on contraception: Secondary | ICD-10-CM | POA: Diagnosis not present

## 2023-02-17 DIAGNOSIS — O219 Vomiting of pregnancy, unspecified: Secondary | ICD-10-CM

## 2023-02-17 MED ORDER — NORETHINDRONE 0.35 MG PO TABS: 1.0000 | ORAL_TABLET | Freq: Every day | ORAL | 4 refills | Status: DC

## 2023-02-17 NOTE — Progress Notes (Signed)
Post Partum Visit Note  Deborah Lawson is a 32 y.o. [id number] female who presents for a postpartum visit. She is  5.3  weeks postpartum following a repeat cesarean section.  I have fully reviewed the prenatal and intrapartum course. The delivery was at 38.1 gestational weeks.  Anesthesia: spinal. Postpartum course has been good. Baby is doing well. Baby is feeding by both breast and bottle - Similac Advance. Bleeding no bleeding. Bowel function is normal. Bladder function is normal. Patient is not sexually active. Contraception method is none. Postpartum depression screening: negative.   The pregnancy intention screening data noted above was reviewed. Potential methods of contraception were discussed. The patient elected to proceed with No data recorded.    Health Maintenance Due  Topic Date Due   Cervical Cancer Screening (HPV/Pap Cotest)  Never done   COVID-19 Vaccine (3 - 2023-24 season) 11/14/2022   Last pap 04/30/2019 NILM ([facility])  The following portions of the patient's history were reviewed and updated as appropriate: allergies, current medications, past family history, past medical history, past social history, past surgical history, and problem list.  Review of Systems Pertinent items are noted in HPI.  Objective:  BP 120/84   Pulse 99   Wt 193 lb 8 oz (87.8 kg)   LMP 04/18/2022 (Exact Date)   Breastfeeding Yes Comment: and bottle feeding  BMI 32.20 kg/m    General:  alert, cooperative, and no distress   Breasts:  not indicated  Lungs: Normal effort  GU exam:   Surgically absent labia minora and clitoris, not able to completely visualize cervix, no masses appreciated        Edinburgh Postnatal Depression Scale - 02/17/23 1627       Edinburgh Postnatal Depression Scale:  In the Past 7 Days   I have been able to laugh and see the funny side of things. 0    I have looked forward with enjoyment to things. 0    I have blamed myself unnecessarily when things  went wrong. 0    I have been anxious or worried for no good reason. 0    I have felt scared or panicky for no good reason. 0    Things have been getting on top of me. 0    I have been so unhappy that I have had difficulty sleeping. 0    I have felt sad or miserable. 0    I have been so unhappy that I have been crying. 0    The thought of harming myself has occurred to me. 0    Edinburgh Postnatal Depression Scale Total 0              Assessment:   Routine postpartum exam.   Plan:   Essential components of care per ACOG recommendations:  1.  Mood and well being: Patient with negative depression screening today. Reviewed local resources for support.  - Patient tobacco use? No.   - hx of drug use? No.    2. Infant care and feeding:  -Patient currently breastmilk feeding? Yes. Reviewed importance of draining breast regularly to support lactation.  -Social determinants of health (SDOH) reviewed in EPIC. No concerns  3. Sexuality, contraception and birth spacing - Patient does not want a pregnancy in the next year.  Desired family size is 4 children.  - Reviewed reproductive life planning. Reviewed contraceptive methods based on pt preferences and effectiveness.  Patient desired Oral Contraceptive today.   - Discussed birth  spacing of 18 months - has migraines and concerned about them while using contraception; used to be on pills; was on pills back in 2020 About to start gabapentin for migraines; was not on this medication when on micronor before  - previously had nexplanon: amenorrhea and worsened migraines  4. Sleep and fatigue -Encouraged family/partner/community support of 4 hrs of uninterrupted sleep to help with mood and fatigue  5. Physical Recovery  - Discussed patients delivery and complications. She describes her labor as good. - Patient had a C-section. Patient had a did not have a laceration. Perineal healing reviewed. Patient expressed understanding - Patient  has urinary incontinence? No. - Patient is safe to resume physical and sexual activity  6.  Health Maintenance - HM due items addressed Yes - Last pap smear No results found for: "DIAGPAP" Pap smear done at today's visit.  -Breast Cancer screening indicated? No.   7. Chronic Disease/Pregnancy Condition follow up: Hypertension  - PCP follow up  Deborah Lawson, CMA [facility] for [employer], [facility]   Lorriane Shire, MD, FACOG Minimally Invasive Gynecologic Surgery  Obstetrics and Gynecology, [facility] for [facility], [facility] 02/17/2023

## 2023-02-21 ENCOUNTER — Encounter: Payer: Self-pay | Admitting: Obstetrics and Gynecology

## 2023-02-21 ENCOUNTER — Other Ambulatory Visit: Payer: Self-pay | Admitting: Advanced Practice Midwife

## 2023-02-21 LAB — CYTOLOGY - PAP
Comment: NEGATIVE
Diagnosis: NEGATIVE
High risk HPV: NEGATIVE

## 2023-02-22 ENCOUNTER — Other Ambulatory Visit: Payer: Self-pay

## 2023-02-22 ENCOUNTER — Telehealth: Payer: Self-pay | Admitting: Lactation Services

## 2023-02-22 ENCOUNTER — Other Ambulatory Visit: Payer: Self-pay | Admitting: Advanced Practice Midwife

## 2023-02-22 NOTE — Telephone Encounter (Signed)
Called and spoke with Pharmacy and they report patient picked up yesterday. Pharmacy reports it did not go through insurance. Medicaid is the one that is saying she is pregnant and would not cover it, patient paid out of pocket $32.99.   Messages patient back that Medicaid needs to be called and informed she is no longer pregnant for them to cover it.   Called [facility] to inform them patient is no longer pregnant. Spoke with Sue Lush, she reports there is soft reject due to also having PNV picked up on 11/18. She reports if Pharmacy had called they could have gotten it released with correct DUR codes. Reference # [id number].

## 2023-02-22 NOTE — Telephone Encounter (Signed)
Called patient with assistance of [employer]phone [employer]pre[employer], # [id number].   Patient did not answer. LM for patient to call the office at her convenience. Will reply to Mychart message.

## 2023-02-23 ENCOUNTER — Other Ambulatory Visit: Payer: Self-pay

## 2023-02-23 NOTE — Progress Notes (Unsigned)
NEUROLOGY FOLLOW UP OFFICE NOTE  Deborah Lawson [id number]  Assessment/Plan:   Atypical headache/facial pain, initially left sided but now reports right sided pain as well, unclear etiology- MRI brain/trigeminal nerves/cervical spine and CT sinuses do not reveal etiology.  She does have tenderness in the right TMJ, raising possibility of TMJ dysfunction.  She states that she may grind her teeth in her sleep      Will restart gabapentin 300mg  at bedtime.  I advised that she first check with her baby's pediatrician for approval before starting. Concern for rebound headache, advised to limit analgesics such as Tylenol to no more than 10 days out of the month. As she has had right sided headaches that started after last brain MRI, will check MRI of brain without contrast Advised to follow up with her dentist to find out if she may benefit from a mouth guard.  Follow up 5 months.  Total time spent in chart and face to face with patient:  40 minutes.    Subjective:  Deborah Lawson is a 32 year old female who follows up for atypical facial pain.  She is accompanied by Arabic speaking interpreter via phone (ID [phone]).   UPDATE: She delivered a baby girl via C-section on 10/28.  Currently breastfeeding.  During her pregnancy, headaches were mild off gabapentin.  They remain mild.  She has been taking Tylenol two to three times daily everyday.  If she only takes it once, then she gets the headache.    She saw the dentist today who took imaging of her teeth and found no dental cause of her pain.    Current NSAIDS/analgesics:  acetaminophen Current triptans:  none Current ergotamine:  none Current anti-emetic:  none Current muscle relaxants:  none Current Antihypertensive medications:  none Current Antidepressant medications:  none Current Anticonvulsant medications:  gabapentin 300mg  daily Current anti-CGRP:  none Current Vitamins/Herbal/Supplements:  none Current  Antihistamines/Decongestants:  none Other therapy:  none Hormone/birth control:  none   HISTORY: She started having a headache around January 2024.  No prior history of headache.  It is a moderate to severe left sided pressure/aching pain involving the left temple, eye, ear and face, including along the jaw and behind TMJ.  Sometimes with associated left sided/occipital numbness and tingling.  It comes and goes during the day the pain lessens and resolves by next day.  They occurs when she feels stressed or depressed, about 3 to 4 days a week.  Sometimes associated neck pain.  No associated nausea, vomiting, photophobia, phonophobia, visual disturbance, autonomic symptoms, numbness or weakness.  She went to the ED where she was diagnosed with a cavity.  She had the tooth pulled but pain persisted.  She was seen in the ED on 04/23/2020 where CT head personally reviewed was unremarkable and she was treated with headache cocktail.  She returned to the ED on 05/31/2020 for left sided TMJ pain where she was prescribed a prednisone taper and instructed to take Motrin as needed.    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  She was referred to endocrinology. She saw the dentist and had the cavity treated. Noted some improvement.  .  Referred to PT for neck/TMJ pain.  Still with neck pain radiating down left arm.  In March 2023, she was treated for left otitis media.  Still has left ear pain..  Treated for sinusitis  and left otitis media.  Still with pain in left ear.  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  CT sinuses on 09/21/2021 revealed resolution of previous left maxillary sinusitis. She saw endocrinology regarding the pituitary microadenoma with normal workup.   In March 2024, she endorsed right sided stabbing pain involving the face, head and neck.  CT sinuses on 08/23/2022 revealed small volume of frothy secretions in the left sphenoid sinus but otherwise patent sinuses.  MRI of trigeminal nerve without contrast (pregnant) on 09/17/2022 was negative.  Sometimes facial pain comes and goes.  Headaches improved.  Due to ongoing cervical radiculitis, MRI of cervical spine was performed on 07/22/2021, which showed mild cervical spondylosis but no significant spinal canal or foraminal stenosis.  NCV-EMG of left upper extremity on 02/11/2022 was normal.    She has history of chronic low back pain radiating down the left leg.  She has had worsening left leg pain with anterior numbness from the thigh down to the foot.  No trauma.     Past NSAIDS/analgesics:  ibuprofen Past abortive triptans:  none Past abortive ergotamine:  none Past muscle relaxants:  none Past anti-emetic:  none Past antihypertensive medications:  nifedipine Past antidepressant medications:  none Past anticonvulsant medications:  gabapentin 600mg  BID (stopped due to pregnancy) Past anti-CGRP:  none Past vitamins/Herbal/Supplements:  none Past antihistamines/decongestants:  none Other past therapies:  none  PAST MEDICAL HISTORY: 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     azelastine (ASTELIN) 0.1 % nasal spray Place 2 sprays into both nostrils 2 (two) times daily. Use in each nostril as  directed 30 mL 5   docusate sodium (COLACE) 100 MG capsule Take 1 capsule (100 mg total) by mouth 2 (two) times daily. 60 capsule 0   fluticasone (FLONASE) 50 MCG/ACT nasal spray Place into both nostrils.     furosemide (LASIX) 20 MG tablet Take 1 tablet (20 mg total) by mouth daily. 4 tablet 0   ibuprofen (ADVIL) 600 MG tablet Take 1 tablet (600 mg total) by mouth every 6 (six) hours as needed. 30 tablet 0   loperamide (IMODIUM) 2 MG capsule TAKE 1 TABLET (2 MG TOTAL) BY MOUTH AS NEEDED FOR DIARRHEA OR LOOSE STOOLS. 30 capsule 0   meloxicam (MOBIC) 7.5 MG tablet TAKE 1 TABLET BY MOUTH EVERY DAY 30 tablet 0   metoCLOPramide (REGLAN) 10 MG tablet Take 10 mg by mouth every 6 (six) hours as needed.     NIFEdipine (ADALAT CC) 30 MG 24 hr tablet Take 1 tablet (30 mg total) by mouth at bedtime. 30 tablet 1   norethindrone (MICRONOR) 0.35 MG tablet TAKE 1 TABLET BY MOUTH EVERY DAY 84 tablet 4   oxyCODONE (ROXICODONE) 5 MG immediate release tablet Take 1 tablet (5 mg total) by mouth every 8 (eight) hours as needed. 20 tablet 0   Prenatal  Vit-Fe Fumarate-FA (PRENATAL PLUS VITAMIN/MINERAL) 27-1 MG TABS Take 1 tablet by mouth daily. 30 tablet 11   psyllium (METAMUCIL SMOOTH TEXTURE) 58.6 % powder Take 1 packet by mouth daily as needed. 283 g 12   No current facility-administered medications on file prior to visit.    ALLERGIES: Allergies  Allergen Reactions   Pork-Derived Products     Religious    FAMILY HISTORY: Family History  Problem Relation Age of Onset   Allergic rhinitis Mother    Hypertension Father       Objective:  Blood pressure 118/79, pulse 88, SpO2 98%, currently breastfeeding. General: No acute distress.  Patient appears well-groomed.   Head:  Normocephalic/atraumatic, tenderness to palpation of her right TMJ Eyes:  Fundi examined but not visualized Neck: supple, left sided paraspinal tenderness, full range of motion Neurological Exam: alert and oriented.  Speech fluent and  not dysarthric, language intact.  CN II-XII intact. Bulk and tone normal, muscle strength 5/5 throughout.  Sensation to light touch intact.  Deep tendon reflexes 2+ throughout.  Finger to nose testing intact.  Gait normal, Romberg negative.   Shon Millet, DO  CC: Peggye Pitt Acosta,MD

## 2023-02-24 ENCOUNTER — Ambulatory Visit: Payer: Medicaid Other | Admitting: Neurology

## 2023-02-24 ENCOUNTER — Encounter: Payer: Self-pay | Admitting: Neurology

## 2023-02-24 VITALS — BP 118/79 | HR 88

## 2023-02-24 DIAGNOSIS — G501 Atypical facial pain: Secondary | ICD-10-CM

## 2023-02-24 DIAGNOSIS — R519 Headache, unspecified: Secondary | ICD-10-CM | POA: Diagnosis not present

## 2023-02-24 DIAGNOSIS — M26629 Arthralgia of temporomandibular joint, unspecified side: Secondary | ICD-10-CM | POA: Diagnosis not present

## 2023-02-24 MED ORDER — GABAPENTIN 300 MG PO CAPS: 300.0000 mg | ORAL_CAPSULE | Freq: Every day | ORAL | 5 refills | Status: DC

## 2023-02-24 NOTE — Patient Instructions (Addendum)
I prescribed gabapentin 300mg  at bedtime.  First, check with your baby's pediatrician to make sure it is okay to take while breastfeeding. Check MRI of brain without contrast Find out from your dentist if they feel a MOUTH GUARD may be helpful Limit use of pain relievers, such as Tylenol, to no more than 2 days out of week to prevent risk of rebound or medication-overuse headache. Follow up in 5 months.    1. ??? ???? ????????? 300 ??? ??? ?????.  ?????? ??????? ???? ??????? ????? ????? ?????? ?? ??? ?? ??????? ?????? ????? ??????? ????????. 2. ??? ??????? ??????? ?????????? ?????? ???? ????? 3. ?????? ?? ???? ?????? ??? ??? ???? ?? ???? ???? ?? ???? ?????? 4. ???? ?? ??????? ?????? ?????? ??? ????????? ???? ?? ???? ?? ????? ?? ??????? ???? ??? ???? ?????? ?? ??????? ?? ????? ??????. 5. ???????? ???? 5 ????. 1. laqad wusifat jababnatayn 300 muligh eind alnuwmi. awlaan, astishiri tabib al'atfal alkhasi bitiflik lilta'akud min 'anah min almaqbul tanawulah 'athna' alradaeat altabieiati. 2. fahas altaswir bialranin almighnatisii lildimagh bidun tabayun 3. aistafsar min tabib 'asnanik 'iidha kan yasheur 'ana waqi alfam qad yakun mfydan 4. alhadu min aistikhdam musakinat al'almi, mithl taylinul, limudat la tazid ean yawmayn fi al'usbue limane khatar eawdat alsudae 'aw al'iifrat fi tanawul aldawa'i. 5. almutabaeat khilal 5 'ashhuru.

## 2023-02-27 ENCOUNTER — Other Ambulatory Visit: Payer: Self-pay | Admitting: Internal Medicine

## 2023-02-27 DIAGNOSIS — R0789 Other chest pain: Secondary | ICD-10-CM

## 2023-02-27 DIAGNOSIS — M549 Dorsalgia, unspecified: Secondary | ICD-10-CM

## 2023-02-28 ENCOUNTER — Telehealth: Payer: Self-pay | Admitting: Neurology

## 2023-02-28 ENCOUNTER — Telehealth: Payer: Self-pay | Admitting: Family Medicine

## 2023-02-28 NOTE — Telephone Encounter (Signed)
Patient states she did ask her provider but they haven't answer yet.

## 2023-02-28 NOTE — Telephone Encounter (Signed)
Patient has questions regarding a specific medication and taking it while on birth control. She would like for a nurse to call her back at [phone]).

## 2023-02-28 NOTE — Telephone Encounter (Signed)
Patient advised of Dr.Jaffe note, Gabapentin should be okay to take with birth control medication.  However, I want her to contact her baby's pediatrician to see if it is okay to take while breastfeeding.

## 2023-02-28 NOTE — Telephone Encounter (Signed)
Pt called in and left a message with the access nurse. She stated she was prescribed gabapentin and wants to make sure she can take it with birth control?

## 2023-03-01 NOTE — Telephone Encounter (Signed)
Patient was prescribed gabapentin by neurologist for headaches. She would like to confirm this is safe for breastfeeding and will not interact with her birth control tablets. Reviewed with Edd Arbour CNM who states this is safe for breastfeeding and should not interact with birth control tablets. Reports taking birth control tablets for 4 days and then began having bleeding so she stopped. After bleeding stopped she resumed birth control tablets. Reviewed need to take birth control daily as prescribed and that some irregular bleeding can be expected for first 1-3 packs.

## 2023-03-03 ENCOUNTER — Other Ambulatory Visit: Payer: Self-pay

## 2023-03-03 ENCOUNTER — Encounter: Payer: Self-pay | Admitting: Allergy & Immunology

## 2023-03-03 ENCOUNTER — Encounter: Payer: Self-pay | Admitting: Neurology

## 2023-03-03 ENCOUNTER — Ambulatory Visit (INDEPENDENT_AMBULATORY_CARE_PROVIDER_SITE_OTHER): Payer: Medicaid Other | Admitting: Allergy & Immunology

## 2023-03-03 VITALS — BP 118/70 | HR 94 | Temp 98.7°F | Ht 65.0 in | Wt 196.9 lb

## 2023-03-03 DIAGNOSIS — J310 Chronic rhinitis: Secondary | ICD-10-CM | POA: Diagnosis not present

## 2023-03-03 DIAGNOSIS — B999 Unspecified infectious disease: Secondary | ICD-10-CM

## 2023-03-03 MED ORDER — AMOXICILLIN-POT CLAVULANATE 875-125 MG PO TABS: 1.0000 | ORAL_TABLET | Freq: Two times a day (BID) | ORAL | 0 refills | Status: AC

## 2023-03-03 NOTE — Progress Notes (Signed)
FOLLOW UP  Date of Service/Encounter:  03/03/23   Assessment:   Chronic rhinitis - we we will have more options for treatment after her pregnancy such as allergen immunotherapy   Recurrent infections - mostly sinopulmonary in nature  Plan/Recommendations:   1. Chronic rhinitis - Testing was negative when we did the blood work. - We can do skin testing at the next visit.  - This might answer some more questions. - We could not do that at the last visit since you were pregnant.  - In the meantime, continue with Astelin (azelastine) two sprays per nostril twice daily.   2. Recurrent infections - You were not protective to Streptococcus pneumoniae, but we are going to check again today since you had the recent vaccination.  - We will talk about the results at the next visit.   3. Return in about 2 weeks (around 03/17/2023) for SKIN TESTING (all IDs) .   Subjective:   Deborah Lawson is a 32 y.o. 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  We do have an interpreter over the iPad.  Discussed the use of AI scribe software for clinical note transcription with the patient and/or guardian, who gave verbal consent to  proceed.  Deborah Lawson is a 32 y.o. female presenting for a follow up visit.  We last saw her in October 2024.  At that time, we obtain labs to look for environmental allergies.  We added on Astelin 2 sprays per nostril daily to see if that helped at all.  For her history of recurrent infections, we obtained an immune workup.   Her environmental allergy panel via the blood was completely negative.  Her immune workup was normal aside from inadequate protection against Streptococcus pneumonia.  She was only protective to 1 out of 23 serotypes.  We recommended that she get a Pneumovax.  Since last visit, she has been about the same.  She did have a healthy baby around 2 months ago who is doing very well.  Allergic Rhinitis Symptom History: She was surprised that she did not have any allergies when we did the blood work.  She is open to doing skin testing.  She remains on the azelastine which may or may not be helpful.  It is hard to get a good sense of this.  She has seen ENT in the past, who did a sinus CT which was reportedly normal.  She also had a sinus CT in July 2023 that showed resolution of the left maxillary sinus mucosal inflammation.  This was ordered by Dr. Suszanne Conners. Review of the chart shows that she saw PA Montserrat.  No surgery or intervention was recommended.  It looks like she has been on Flonase in the  past.  She has also tried Claritin and Zyrtec over-the-counter.  Sinus CT (June 2024):   IMPRESSION: 1. Small volume frothy secretions in the left sphenoid sinus. No sinus outflow obstruction or mucosal thickening. 2. Right more than left concha bullosa. 3. Uncomplicated right frontal sinus osteoma.   We did spend some time discussing her lab results.  She actually received the Pneumovax on 01/30/23 per the patient.  It has been over a month since she received the vaccination.  She also had appendicitis at the end of November and had an appendectomy.  She does see neurology for management of her  migraines. She sees Dr. Everlena Cooper.   Otherwise, there have been no changes to her past medical history, surgical history, family history, or social history.    Review of systems otherwise negative other than that mentioned in the HPI.    Objective:   Blood pressure 118/70, pulse 94, temperature 98.7 F (37.1 C), temperature source Temporal, weight 196 lb 14.4 oz (89.3 kg), SpO2 99%, currently breastfeeding. Body mass index is 32.77 kg/m.    Physical Exam Vitals reviewed.  Constitutional:      Appearance: She is well-developed.     Comments: Moving air well in all lung fields.  No increased work of breathing.  HENT:     Head: Normocephalic and atraumatic.     Right Ear: Tympanic membrane, ear canal and external ear normal. No drainage, swelling or tenderness. Tympanic membrane is not injected, scarred, erythematous, retracted or bulging.     Left Ear: Tympanic membrane, ear canal and external ear normal. No drainage, swelling or tenderness. Tympanic membrane is not injected, scarred, erythematous, retracted or bulging.     Nose: Rhinorrhea present. No nasal deformity, septal deviation or mucosal edema.     Right Turbinates: Enlarged, swollen and pale.     Left Turbinates: Enlarged, swollen and pale.     Right Sinus: No maxillary sinus tenderness or frontal sinus tenderness.     Left Sinus: No maxillary sinus tenderness or frontal sinus tenderness.     Comments: No polyps.  Copious rhinorrhea.    Mouth/Throat:     Mouth: Mucous membranes are not pale and not dry.     Pharynx: Uvula midline.  Eyes:     General:        Right eye: No discharge.        Left eye: No discharge.     Conjunctiva/sclera: Conjunctivae normal.     Right eye: Right conjunctiva is not injected. No chemosis.    Left eye: Left conjunctiva is not injected. No chemosis.    Pupils: Pupils are equal, round, and reactive to light.  Cardiovascular:     Rate and Rhythm: Normal rate and regular rhythm.     Heart  sounds: Normal heart sounds.  Pulmonary:     Effort: Pulmonary effort is normal. No tachypnea, accessory muscle usage or respiratory distress.     Breath sounds: Normal breath sounds. No wheezing, rhonchi or rales.  Chest:     Chest wall: No tenderness.  Lymphadenopathy:     Head:     Right side of head: No submandibular, tonsillar or occipital adenopathy.     Left side of head: No submandibular, tonsillar or occipital adenopathy.     Cervical: No cervical adenopathy.  Skin:    General: Skin is warm.     Capillary Refill: Capillary refill takes less than 2 seconds.     Coloration: Skin is not pale.  Findings: No abrasion, erythema, petechiae or rash. Rash is not papular, urticarial or vesicular.     Comments: No eczematous or urticarial lesions noted.  Neurological:     Mental Status: She is alert.  Psychiatric:        Behavior: Behavior is cooperative.      Diagnostic studies: labs sent instead       Malachi Bonds, MD  [facility]

## 2023-03-03 NOTE — Patient Instructions (Addendum)
1. Chronic rhinitis - Testing was negative when we did the blood work. - We can do skin testing at the next visit.  - This might answer some more questions. - We could not do that at the last visit since you were pregnant.  - In the meantime, continue with Astelin (azelastine) two sprays per nostril twice daily.   2. Recurrent infections - You were not protective to Streptococcus pneumoniae, but we are going to check again today since you had the recent vaccination.  - We will talk about the results at the next visit.   3. Return in about 2 weeks (around 03/17/2023) for SKIN TESTING (all IDs) .    Please inform us of any Emergency Department visits, hospitalizations, or changes in symptoms. Call us before going to the ED for breathing or allergy symptoms since we might be able to fit you in for a sick visit. Feel free to contact us anytime with any questions, problems, or concerns.  It was a pleasure to see you again today!  Websites that have reliable patient information: 1. American Academy of Asthma, Allergy, and Immunology: www.aaaai.org 2. Food Allergy Research and Education (FARE): foodallergy.org 3. Mothers of Asthmatics: http://www.asthmacommunitynetwork.org 4. American College of Allergy, Asthma, and Immunology: www.acaai.org      "Like" [place] on Facebook and Instagram for our latest updates!      A healthy democracy works best when [employer] participate! Make sure you are registered to vote! If you have moved or changed any of your contact information, you will need to get this updated before voting! Scan the QR codes below to learn more!

## 2023-03-04 ENCOUNTER — Other Ambulatory Visit: Payer: Self-pay

## 2023-03-11 LAB — STREP PNEUMONIAE 23 SEROTYPES IGG
Pneumo Ab Type 1*: <0.1 ug/mL — ABNORMAL LOW (ref >1.3)
Pneumo Ab Type 12 (12F)*: 0.5 ug/mL — ABNORMAL LOW (ref >1.3)
Pneumo Ab Type 14*: 1.2 ug/mL — ABNORMAL LOW (ref >1.3)
Pneumo Ab Type 17 (17F)*: <0.1 ug/mL — ABNORMAL LOW (ref >1.3)
Pneumo Ab Type 19 (19F)*: 4.5 ug/mL (ref >1.3)
Pneumo Ab Type 2*: <0.2 ug/mL — ABNORMAL LOW (ref >1.3)
Pneumo Ab Type 20*: 0.8 ug/mL — ABNORMAL LOW (ref >1.3)
Pneumo Ab Type 22 (22F)*: <0.1 ug/mL — ABNORMAL LOW (ref >1.3)
Pneumo Ab Type 23 (23F)*: 10.7 ug/mL (ref >1.3)
Pneumo Ab Type 26 (6B)*: <0.1 ug/mL — ABNORMAL LOW (ref >1.3)
Pneumo Ab Type 3*: <0.1 ug/mL — ABNORMAL LOW (ref >1.3)
Pneumo Ab Type 34 (10A)*: <0.1 ug/mL — ABNORMAL LOW (ref >1.3)
Pneumo Ab Type 4*: 0.4 ug/mL — ABNORMAL LOW (ref >1.3)
Pneumo Ab Type 43 (11A)*: 0.5 ug/mL — ABNORMAL LOW (ref >1.3)
Pneumo Ab Type 5*: 0.4 ug/mL — ABNORMAL LOW (ref >1.3)
Pneumo Ab Type 51 (7F)*: <0.1 ug/mL — ABNORMAL LOW (ref >1.3)
Pneumo Ab Type 54 (15B)*: 11.5 ug/mL (ref >1.3)
Pneumo Ab Type 56 (18C)*: 0.1 ug/mL — ABNORMAL LOW (ref >1.3)
Pneumo Ab Type 57 (19A)*: 2.5 ug/mL (ref >1.3)
Pneumo Ab Type 68 (9V)*: 0.4 ug/mL — ABNORMAL LOW (ref >1.3)
Pneumo Ab Type 70 (33F)*: 2.8 ug/mL (ref >1.3)
Pneumo Ab Type 8*: 8.1 ug/mL (ref >1.3)
Pneumo Ab Type 9 (9N)*: 0.3 ug/mL — ABNORMAL LOW (ref >1.3)

## 2023-03-12 ENCOUNTER — Ambulatory Visit
Admission: RE | Admit: 2023-03-12 | Discharge: 2023-03-12 | Disposition: A | Discharge status: Home / Self Care | Payer: Medicaid Other | Source: Ambulatory Visit | Attending: Neurology | Admitting: Neurology

## 2023-03-12 DIAGNOSIS — R519 Headache, unspecified: Secondary | ICD-10-CM

## 2023-03-12 DIAGNOSIS — M26629 Arthralgia of temporomandibular joint, unspecified side: Secondary | ICD-10-CM

## 2023-03-12 DIAGNOSIS — G501 Atypical facial pain: Secondary | ICD-10-CM

## 2023-03-17 ENCOUNTER — Other Ambulatory Visit: Payer: Self-pay | Admitting: *Deleted

## 2023-03-17 ENCOUNTER — Ambulatory Visit (INDEPENDENT_AMBULATORY_CARE_PROVIDER_SITE_OTHER): Payer: Medicaid Other | Admitting: Allergy & Immunology

## 2023-03-17 DIAGNOSIS — J3089 Other allergic rhinitis: Secondary | ICD-10-CM

## 2023-03-17 DIAGNOSIS — B999 Unspecified infectious disease: Secondary | ICD-10-CM

## 2023-03-17 MED ORDER — EPINEPHRINE 0.3 MG/0.3ML IJ SOAJ: 0.3000 mg | INTRAMUSCULAR | 1 refills | Status: AC | PRN

## 2023-03-17 NOTE — Patient Instructions (Addendum)
 1. Chronic rhinitis - Testing today showed: dust mites - Copy of test results provided.  - Avoidance measures provided.  - Get the dust mite coverings for the bed and pillows to see if this helps.  - We will write a letter to your apartment asking them to remove the carpeting or at least change it.  - Continue with: Astelin  (azelastine ) 2 sprays per nostril 1-2 times daily as needed - You can use an extra dose of the antihistamine, if needed, for breakthrough symptoms.  - Consider nasal saline rinses 1-2 times daily to remove allergens from the nasal cavities as well as help with mucous clearance (this is especially helpful to do before the nasal sprays are given) - Consider allergy  shots as a means of long-term control. - Allergy  shots re-train and reset the immune system to ignore environmental allergens and decrease the resulting immune response to those allergens (sneezing, itchy watery eyes, runny nose, nasal congestion, etc).    - Allergy  shots improve symptoms in 75-85% of patients.  - We can discuss more at the next appointment if the medications are not working for you.  2. Recurrent infections - You were only protective 6/23 strains of Streptococcus pneumonia.  - This is not a great response and could be a sign of specific antibody deficiency.  - We can do antibiotics to prevent infections or look into starting immunoglobulin therapy.  - Continue to monitor infections and we can be more aggressive if needed in the future.   3. Return in about 6 months (around 09/14/2023).    Please inform us  of any Emergency Department visits, hospitalizations, or changes in symptoms. Call us  before going to the ED for breathing or allergy  symptoms since we might be able to fit you in for a sick visit. Feel free to contact us  anytime with any questions, problems, or concerns.  It was a pleasure to see you again today!  Websites that have reliable patient information: 1. American Academy of  Asthma, Allergy , and Immunology: www.aaaai.org 2. Food Allergy  Research and Education (FARE): foodallergy.org 3. Mothers of Asthmatics: http://www.asthmacommunitynetwork.org 4. American College of Allergy , Asthma, and Immunology: www.acaai.org      "Like" us  on Facebook and Instagram for our latest updates!      A healthy democracy works best when [employer] participate! Make sure you are registered to vote! If you have moved or changed any of your contact information, you will need to get this updated before voting! Scan the QR codes below to learn more!            Control of Dust Mite Allergen    Dust mites play a major role in allergic asthma and rhinitis.  They occur in environments with high humidity wherever human skin is found.  Dust mites absorb humidity from the atmosphere (ie, they do not drink) and feed on organic matter (including shed human and animal skin).  Dust mites are a microscopic type of insect that you cannot see with the naked eye.  High levels of dust mites have been detected from mattresses, pillows, carpets, upholstered furniture, bed covers, clothes, soft toys and any woven material.  The principal allergen of the dust mite is found in its feces.  A gram of dust may contain 1,000 mites and 250,000 fecal particles.  Mite antigen is easily measured in the air during house cleaning activities.  Dust mites do not bite and do not cause harm to humans, other than by triggering allergies/asthma.  Ways to decrease your exposure to dust mites in your home:  Encase mattresses, box springs and pillows with a mite-impermeable barrier or cover   Wash sheets, blankets and drapes weekly in hot water  (130 F) with detergent and dry them in a dryer on the hot setting.  Have the room cleaned frequently with a vacuum cleaner and a damp dust-mop.  For carpeting or rugs, vacuuming with a vacuum cleaner equipped with a high-efficiency particulate air (HEPA) filter.  The dust  mite allergic individual should not be in a room which is being cleaned and should wait 1 hour after cleaning before going into the room. Do not sleep on upholstered furniture (eg, couches).   If possible removing carpeting, upholstered furniture and drapery from the home is ideal.  Horizontal blinds should be eliminated in the rooms where the person spends the most time (bedroom, study, television room).  Washable vinyl, roller-type shades are optimal. Remove all non-washable stuffed toys from the bedroom.  Wash stuffed toys weekly like sheets and blankets above.   Reduce indoor humidity to less than 50%.  Inexpensive humidity monitors can be purchased at most hardware stores.  Do not use a humidifier as can make the problem worse and are not recommended.

## 2023-03-17 NOTE — Progress Notes (Signed)
 Follow-up with  FOLLOW UP  Date of Service/Encounter:  03/17/23   Assessment:   Perennial allergic rhinitis - dust mites only   Recurrent infections - mostly sinopulmonary in nature, but with poor response to Pneumovax  Plan/Recommendations:   1. Chronic rhinitis - Testing today showed: dust mites - Copy of test results provided.  - Avoidance measures provided.  - Get the dust mite coverings for the bed and pillows to see if this helps.  - We will write a letter to your apartment asking them to remove the carpeting or at least change it.  - They can contact us  with any further information if needed.  - Continue with: Astelin  (azelastine ) 2 sprays per nostril 1-2 times daily as needed - You can use an extra dose of the antihistamine, if needed, for breakthrough symptoms.  - Consider nasal saline rinses 1-2 times daily to remove allergens from the nasal cavities as well as help with mucous clearance (this is especially helpful to do before the nasal sprays are given) - Consider allergy  shots as a means of long-term control. - Allergy  shots re-train and reset the immune system to ignore environmental allergens and decrease the resulting immune response to those allergens (sneezing, itchy watery eyes, runny nose, nasal congestion, etc).    - Allergy  shots improve symptoms in 75-85% of patients.  - We can discuss more at the next appointment if the medications are not working for you. - Information on the time course of allergy  shots, including weekly appointments initially, discussed via interpreter.   2. Recurrent infections - You were only protective 6/23 strains of Streptococcus pneumonia.  - This is not a great response and could be a sign of specific antibody deficiency.  - We can do antibiotics to prevent infections or look into starting immunoglobulin therapy.  - Continue to monitor infections and we can be more aggressive if needed in the future.  - Discussed the  implications of specific antibody deficiency with the patient via interpreter.  - I think we can hold off on any antibiotic prophylaxis to see how she does with dust mite avoidance measures. - Copy of testing results provided today.  - We did go into detail regarding immunoglobulin replacement therapy, although I am not sure at this point that we need to pursue this.  3. Return in about 6 months (around 09/14/2023).    Total of 30 minutes, greater than 50% of which was spent in discussion of treatment and management options.    Subjective:   Deborah Lawson is a 33 y.o. female presenting today for follow up of No chief complaint on file.   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  Discussed the use of AI scribe software for clinical note transcription with the patient and/or guardian, who gave verbal consent to proceed.  Deborah Lawson is a 33 y.o. female presenting for skin testing. She was last seen on  December 19.  We did some lab work at that time, including postvaccination streptococcal pneumoniae titers.  This showed a poor response.  She was protective to 1 out of 23 serotypes prior to the vaccine and 6 out of 23 serotypes after the vaccine.  I sent her MyChart message earlier this week with the results of the testing and discussed prophylactic antibiotics versus immunoglobulin  replacement.  She presents today for intradermal testing.  Her previous lab work was negative to the entire environmental panel.  Otherwise, there have been no changes to her past medical history, surgical history, family history, or social history.    Review of systems otherwise negative other than that mentioned in the HPI.    Objective:   currently breastfeeding. There is no height or weight on file to calculate BMI.    Physical exam deferred since this was a skin testing appointment only.   Diagnostic studies:   Spirometry:   Allergy  Studies:    Intradermal - 03/17/23 1341     Time Antigen Placed 1341    Allergen Manufacturer Jestine    Location Arm    Number of Test 16    Control Negative    Bahia Negative    Bermuda Negative    Johnson Negative    7 Grass Negative    Ragweed Mix Negative    Weed Mix Negative    Tree Mix Negative    Mold 1 Negative    Mold 2 Negative    Mold 3 Negative    Mold 4 Negative    Mite Mix 4+    Cat Negative    Dog Negative    Cockroach Negative    Other --   Histamine: 2+            Allergy  testing results were read and interpreted by myself, documented by clinical staff.      Marty Shaggy, MD  [facility] 

## 2023-03-18 ENCOUNTER — Encounter: Payer: Self-pay | Admitting: Allergy & Immunology

## 2023-03-22 NOTE — Addendum Note (Signed)
 Addended by: Alfonse Spruce on: 03/22/2023 06:31 PM   Modules accepted: Orders

## 2023-03-23 DIAGNOSIS — J3089 Other allergic rhinitis: Secondary | ICD-10-CM | POA: Diagnosis not present

## 2023-03-23 NOTE — Progress Notes (Signed)
 VIALS EXP 03-22-24

## 2023-03-23 NOTE — Progress Notes (Signed)
 Aeroallergen Immunotherapy   Ordering Provider: Dr. Marty Shaggy   Patient Details  Name: Deborah Lawson  MRN: [id number]  Date of Birth: 29-Apr-1990   Order 1 of 1   Vial Label: DM   1.0 ml (Volume)   AU Concentration -- Mite Mix (DF 5,000 & DP 5,000)    1.0  ml Extract Subtotal  4.0  ml Diluent   5.0  ml Maintenance Total   Schedule:  B   Blue Vial (1:100,000): Schedule B (6 doses)  Yellow Vial (1:10,000): Schedule B (6 doses)  Green Vial (1:1,000): Schedule B (6 doses)  Red Vial (1:100): Schedule A (14 doses)   Special Instructions: After completion of the first Red Vial, please space to every two weeks. After completion of the second Red Vial, please space to every 4 weeks. Ok to up dose new vials at 0.17mL --> 0.3 mL --> 0.5 mL. Ok to come twice weekly, if desired, as long as there is 48 hours between injections.

## 2023-03-24 ENCOUNTER — Telehealth: Payer: Self-pay | Admitting: *Deleted

## 2023-03-24 ENCOUNTER — Other Ambulatory Visit: Payer: Self-pay | Admitting: Obstetrics and Gynecology

## 2023-03-24 DIAGNOSIS — Z3041 Encounter for surveillance of contraceptive pills: Secondary | ICD-10-CM

## 2023-03-24 MED ORDER — CAMILA 0.35 MG PO TABS: 1.0000 | ORAL_TABLET | Freq: Every day | ORAL | 6 refills | Status: DC

## 2023-03-24 NOTE — Telephone Encounter (Signed)
 Received a phone call transferred from front desk from [facility] representative that she is on phone with patient who is at pharmacy and is trying to get her birth control and is being denied because a pregnancy code was used. I called [facility] pharmacy and they said is insurance issue, they filled it but not covered by insurance. I informed [employer] rep and asked if she can tell me what is covered and that patient is breastfeeding. The representative was unable to tell me what is covered that is safe with breastfeeding. I informed her I will send to provider to ask for different rx.  Rock Skip PEAK

## 2023-03-25 NOTE — Telephone Encounter (Signed)
 Called patient to inform on new Rx of birth control sent that is covered under her insurance and she can take this B/C while breastfeeding.   Patient verified name and DOB.  Patient understood.   Language Resource Arabic [place] [phone] assist with the call.  Ermalinda GRADE CMA

## 2023-03-28 ENCOUNTER — Telehealth: Payer: Self-pay | Admitting: Neurology

## 2023-03-28 NOTE — Telephone Encounter (Signed)
 See results note.

## 2023-03-28 NOTE — Telephone Encounter (Signed)
Pt called in wanting to get her MRI results

## 2023-03-31 NOTE — Progress Notes (Signed)
Patient is coming in for shots on 04/07/2023. I put a note in the appt as an interpreter will be here for that shot visit. Ladies this is just a FYI, as I'm not sure who will be in there that day.

## 2023-04-01 ENCOUNTER — Encounter (HOSPITAL_COMMUNITY): Payer: Self-pay

## 2023-04-01 ENCOUNTER — Ambulatory Visit (HOSPITAL_COMMUNITY)
Admission: EM | Admit: 2023-04-01 | Discharge: 2023-04-01 | Disposition: A | Discharge status: Home / Self Care | Payer: Medicaid Other | Attending: Family Medicine | Admitting: Family Medicine

## 2023-04-01 DIAGNOSIS — N3090 Cystitis, unspecified without hematuria: Secondary | ICD-10-CM

## 2023-04-01 LAB — POCT URINALYSIS DIP (MANUAL ENTRY)
Bilirubin, UA: NEGATIVE
Glucose, UA: NEGATIVE mg/dL
Nitrite, UA: NEGATIVE
Protein Ur, POC: NEGATIVE mg/dL
Spec Grav, UA: >=1.03 — AB
Urobilinogen, UA: 0.2 U/dL
pH, UA: 6

## 2023-04-01 LAB — POCT URINE PREGNANCY: Preg Test, Ur: NEGATIVE

## 2023-04-01 MED ORDER — NITROFURANTOIN MONOHYD MACRO 100 MG PO CAPS: 100.0000 mg | ORAL_CAPSULE | Freq: Two times a day (BID) | ORAL | 0 refills | Status: DC

## 2023-04-01 NOTE — Discharge Instructions (Signed)
The pregnancy test was negative. (??? ?????? ????? ??????  The urinalysis showed some white blood cells, which could be consistent with a bladder infection. (???? ????? ????? ???? ??? ????? ???? ???????? ????? ?? ???? ??????? ?? ???? ???????  Take nitrofurantoin 100 mg--1 capsule 2 times daily for 5 days. (????? ?????????????? 100 ??? - ?????? ????? ????? ?????? ???? 5 ????

## 2023-04-01 NOTE — ED Provider Notes (Signed)
[facility]    CSN: [id number] Arrival date & time: 04/01/23  1322      History   Chief Complaint Chief Complaint  Patient presents with   Back Pain   Fever   Dysuria    HPI Deborah Lawson is a 33 y.o. female.    Back Pain Associated symptoms: dysuria and fever   Fever Associated symptoms: dysuria   Dysuria Associated symptoms: fever   Here for dysuria, some urinary frequency, and subjective fever.  She is having some low back pain also.  No nausea or vomiting  Last menstrual cycle was December 13.  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   History of cesarean delivery, currently pregnant 07/07/2022   Supervision of high risk pregnancy, antepartum 06/23/2022   White matter abnormality on MRI of brain 07/06/2021   History of gestational diabetes in prior pregnancy, currently pregnant    History of gestational hypertension    Language barrier 10/12/2019    Past Surgical History:  Procedure Laterality Date   CESAREAN SECTION N/A 10/26/2019   Procedure: CESAREAN SECTION;  Surgeon: Reva Bores, MD;  Location: [facility];  Service: Obstetrics;  Laterality: N/A;   CESAREAN SECTION N/A 01/10/2023   Procedure: CESAREAN SECTION;  Surgeon: Milas Hock, MD;  Location: [facility];  Service: Obstetrics;  Laterality: N/A;   LAPAROSCOPIC APPENDECTOMY N/A 02/05/2023   Procedure: APPENDECTOMY LAPAROSCOPIC;  Surgeon: Andria Meuse, MD;   Location: [facility];  Service: General;  Laterality: N/A;   LAPAROSCOPIC LYSIS OF ADHESIONS N/A 02/05/2023   Procedure: LAPAROSCOPIC LYSIS OF ADHESIONS;  Surgeon: Andria Meuse, MD;  Location: [facility];  Service: General;  Laterality: N/A;   MANDIBLE SURGERY      OB History     Gravida  5   Para  4   Term  4   Preterm  0   AB  1   Living  4      SAB  1   IAB  0   Ectopic  0   Multiple  0   Live Births  4            Home Medications    Prior to Admission medications   Medication Sig Start Date End Date Taking? Authorizing Provider  nitrofurantoin, macrocrystal-monohydrate, (MACROBID) 100 MG capsule Take 1 capsule (100 mg total) by mouth 2 (two) times daily. 04/01/23  Yes Zenia Resides, MD  acetaminophen (TYLENOL) 325 MG tablet Take 650 mg by mouth every 6 (six) hours as needed for moderate pain (pain score 4-6).    [provider]  azelastine (ASTELIN) 0.1 % nasal spray Place 2 sprays into both nostrils 2 (two) times daily. Use in each nostril as directed 12/28/22   Alfonse Spruce, MD  EPINEPHrine (EPIPEN 2-PAK) 0.3 mg/0.3 mL IJ SOAJ injection Inject 0.3 mg into the muscle as needed for anaphylaxis. 03/17/23   Alfonse Spruce, MD  fluticasone Aleda Grana) 50 MCG/ACT nasal spray Place into both  nostrils. 12/21/22   [provider]  furosemide (LASIX) 20 MG tablet Take 1 tablet (20 mg total) by mouth daily. Patient not taking: Reported on 03/03/2023 01/13/23   Arabella Merles, CNM  gabapentin (NEURONTIN) 300 MG capsule Take 1 capsule (300 mg total) by mouth at bedtime. 02/24/23   Drema Dallas, DO  ibuprofen (ADVIL) 600 MG tablet Take 1 tablet (600 mg total) by mouth every 6 (six) hours as needed. Patient not taking: Reported on 03/03/2023 01/20/23   Carlynn Herald, CNM  loperamide (IMODIUM) 2 MG capsule TAKE 1 TABLET (2 MG TOTAL) BY MOUTH AS NEEDED FOR DIARRHEA OR LOOSE STOOLS. Patient not taking: Reported on 03/03/2023 02/02/23    Sue Lush, FNP  meloxicam (MOBIC) 7.5 MG tablet TAKE 1 TABLET BY MOUTH EVERY DAY Patient not taking: Reported on 03/03/2023 02/28/23   Philip Aspen, Limmie Patricia, MD  metoCLOPramide (REGLAN) 10 MG tablet Take 10 mg by mouth every 6 (six) hours as needed. Patient not taking: Reported on 03/03/2023 01/30/23   [provider]  NIFEdipine (ADALAT CC) 30 MG 24 hr tablet Take 1 tablet (30 mg total) by mouth at bedtime. Patient not taking: Reported on 03/03/2023 01/13/23   Arabella Merles, CNM  norethindrone (CAMILA) 0.35 MG tablet Take 1 tablet (0.35 mg total) by mouth daily. 03/24/23   Lorriane Shire, MD    Family History Family History  Problem Relation Age of Onset   Allergic rhinitis Mother    Hypertension Father     Social History Social History   Tobacco Use   Smoking status: Never    Passive exposure: Never   Smokeless tobacco: Never  Vaping Use   Vaping status: Never Used  Substance Use Topics   Alcohol use: Never   Drug use: Never     Allergies   Pork-derived products   Review of Systems Review of Systems  Constitutional:  Positive for fever.  Genitourinary:  Positive for dysuria.  Musculoskeletal:  Positive for back pain.     Physical Exam Triage Vital Signs ED Triage Vitals  Encounter Vitals Group     BP 04/01/23 1444 116/81     Systolic BP Percentile --      Diastolic BP Percentile --      Pulse Rate 04/01/23 1444 92     Resp 04/01/23 1444 16     Temp 04/01/23 1444 98.1 F (36.7 C)     Temp Source 04/01/23 1444 Oral     SpO2 04/01/23 1444 98 %     Weight --      Height --      Head Circumference --      Peak Flow --      Pain Score 04/01/23 1443 8     Pain Loc --      Pain Education --      Exclude from Growth Chart --    No data found.  Updated Vital Signs BP 116/81 (BP Location: Left Arm)   Pulse 92   Temp 98.1 F (36.7 C) (Oral)   Resp 16   LMP 03/02/2023 (Approximate)   SpO2 98%   Visual Acuity Right Eye  Distance:   Left Eye Distance:   Bilateral Distance:    Right Eye Near:   Left Eye Near:    Bilateral Near:     Physical Exam Vitals reviewed.  Constitutional:      General: She is not in acute distress.    Appearance: She is not  ill-appearing, toxic-appearing or diaphoretic.  HENT:     Mouth/Throat:     Mouth: Mucous membranes are moist.  Eyes:     Extraocular Movements: Extraocular movements intact.     Conjunctiva/sclera: Conjunctivae normal.     Pupils: Pupils are equal, round, and reactive to light.  Cardiovascular:     Rate and Rhythm: Normal rate and regular rhythm.     Heart sounds: No murmur heard. Pulmonary:     Effort: Pulmonary effort is normal.     Breath sounds: Normal breath sounds.  Abdominal:     Palpations: Abdomen is soft.     Comments: There is mild suprapubic tenderness  Musculoskeletal:     Cervical back: Neck supple.  Lymphadenopathy:     Cervical: No cervical adenopathy.  Skin:    Coloration: Skin is not jaundiced or pale.  Neurological:     General: No focal deficit present.     Mental Status: She is alert and oriented to person, place, and time.  Psychiatric:        Behavior: Behavior normal.      UC Treatments / Results  Labs (all labs ordered are listed, but only abnormal results are displayed) Labs Reviewed  POCT URINALYSIS DIP (MANUAL ENTRY) - Abnormal; Notable for the following components:      Result Value   Clarity, UA turbid (*)    Ketones, POC UA trace (5) (*)    Spec Grav, UA >=1.030 (*)    Blood, UA trace-lysed (*)    Leukocytes, UA Small (1+) (*)    All other components within normal limits  POCT URINE PREGNANCY - Normal    EKG   Radiology No results found.  Procedures Procedures (including critical care time)  Medications Ordered in UC Medications - No data to display  Initial Impression / Assessment and Plan / UC Course  I have reviewed the triage vital signs and the nursing notes.  Pertinent labs &  imaging results that were available during my care of the patient were reviewed by me and considered in my medical decision making (see chart for details).     UPT is negative   urinalysis shows a small amount of leukocytes.  Nitrofurantoin is sent in to treat the UTI. Final Clinical Impressions(s) / UC Diagnoses   Final diagnoses:  Cystitis     Discharge Instructions      The pregnancy test was negative. (??? ?????? ????? ??????  The urinalysis showed some white blood cells, which could be consistent with a bladder infection. (???? ????? ????? ???? ??? ????? ???? ???????? ????? ?? ???? ??????? ?? ???? ???????  Take nitrofurantoin 100 mg--1 capsule 2 times daily for 5 days. (????? ?????????????? 100 ??? - ?????? ????? ????? ?????? ???? 5 ????       ED Prescriptions     Medication Sig Dispense Auth. Provider   nitrofurantoin, macrocrystal-monohydrate, (MACROBID) 100 MG capsule Take 1 capsule (100 mg total) by mouth 2 (two) times daily. 10 capsule Zenia Resides, MD      PDMP not reviewed this encounter.   Zenia Resides, MD 04/01/23 1536

## 2023-04-01 NOTE — ED Triage Notes (Signed)
Patient declined an interpreter.  Patient states she has been having bilateral lower back pain, fever, and dysuria x 4 days.

## 2023-04-07 ENCOUNTER — Ambulatory Visit: Payer: Medicaid Other

## 2023-04-07 ENCOUNTER — Ambulatory Visit: Payer: Medicaid Other | Admitting: *Deleted

## 2023-04-07 ENCOUNTER — Other Ambulatory Visit: Payer: Self-pay | Admitting: Certified Nurse Midwife

## 2023-04-07 DIAGNOSIS — G8918 Other acute postprocedural pain: Secondary | ICD-10-CM

## 2023-04-08 ENCOUNTER — Other Ambulatory Visit: Payer: Self-pay

## 2023-04-08 DIAGNOSIS — G8918 Other acute postprocedural pain: Secondary | ICD-10-CM

## 2023-04-08 MED ORDER — IBUPROFEN 600 MG PO TABS: 600.0000 mg | ORAL_TABLET | Freq: Four times a day (QID) | ORAL | 0 refills | Status: DC | PRN

## 2023-04-14 ENCOUNTER — Ambulatory Visit: Payer: Medicaid Other | Admitting: *Deleted

## 2023-04-14 DIAGNOSIS — J309 Allergic rhinitis, unspecified: Secondary | ICD-10-CM

## 2023-04-14 MED ORDER — AZITHROMYCIN 500 MG PO TABS: ORAL_TABLET | ORAL | 1 refills | Status: DC

## 2023-04-14 NOTE — Progress Notes (Signed)
Immunotherapy   Patient Details  Name: Deborah Lawson MRN: [id number] Date of Birth: 03-23-1990  04/14/2023  Deborah Lawson started injections for  DMITE Following schedule: B  Frequency:2 times per week Epi-Pen:Epi-Pen Available  Consent signed and patient instructions given. Patient started allergy injections today and received 0.108mL of DMITE in the RUA. Patient waited 30 minutes in office and did not experience any issues.   [name] 04/14/2023, 2:17 PM

## 2023-04-14 NOTE — Addendum Note (Signed)
Addended by: Robet Leu A on: 04/14/2023 04:40 PM   Modules accepted: Orders

## 2023-04-18 ENCOUNTER — Ambulatory Visit (INDEPENDENT_AMBULATORY_CARE_PROVIDER_SITE_OTHER): Payer: Medicaid Other | Admitting: *Deleted

## 2023-04-18 DIAGNOSIS — J309 Allergic rhinitis, unspecified: Secondary | ICD-10-CM | POA: Diagnosis not present

## 2023-04-21 ENCOUNTER — Other Ambulatory Visit: Payer: Self-pay

## 2023-04-21 ENCOUNTER — Ambulatory Visit (INDEPENDENT_AMBULATORY_CARE_PROVIDER_SITE_OTHER): Payer: Medicaid Other

## 2023-04-21 ENCOUNTER — Emergency Department (HOSPITAL_BASED_OUTPATIENT_CLINIC_OR_DEPARTMENT_OTHER)
Admission: EM | Admit: 2023-04-21 | Discharge: 2023-04-21 | Disposition: A | Discharge status: Home / Self Care | Payer: Medicaid Other | Attending: Emergency Medicine | Admitting: Emergency Medicine

## 2023-04-21 ENCOUNTER — Encounter (HOSPITAL_BASED_OUTPATIENT_CLINIC_OR_DEPARTMENT_OTHER): Payer: Self-pay | Admitting: Emergency Medicine

## 2023-04-21 DIAGNOSIS — J309 Allergic rhinitis, unspecified: Secondary | ICD-10-CM

## 2023-04-21 DIAGNOSIS — Z20822 Contact with and (suspected) exposure to covid-19: Secondary | ICD-10-CM | POA: Insufficient documentation

## 2023-04-21 DIAGNOSIS — R1084 Generalized abdominal pain: Secondary | ICD-10-CM | POA: Insufficient documentation

## 2023-04-21 DIAGNOSIS — R197 Diarrhea, unspecified: Secondary | ICD-10-CM | POA: Diagnosis not present

## 2023-04-21 LAB — URINALYSIS, ROUTINE W REFLEX MICROSCOPIC
Bacteria, UA: NONE SEEN
Bilirubin Urine: NEGATIVE
Glucose, UA: NEGATIVE mg/dL
Hgb urine dipstick: NEGATIVE
Ketones, ur: NEGATIVE mg/dL
Nitrite: NEGATIVE
Protein, ur: NEGATIVE mg/dL
Specific Gravity, Urine: 1.029 (ref 1.005–1.030)
pH: 6 (ref 5.0–8.0)

## 2023-04-21 LAB — RESP PANEL BY RT-PCR (RSV, FLU A&B, COVID)  RVPGX2
Influenza A by PCR: NEGATIVE
Influenza B by PCR: NEGATIVE
Resp Syncytial Virus by PCR: NEGATIVE
SARS Coronavirus 2 by RT PCR: NEGATIVE

## 2023-04-21 LAB — COMPREHENSIVE METABOLIC PANEL
ALT: 20 U/L (ref 0–44)
AST: 16 U/L (ref 15–41)
Albumin: 4.1 g/dL (ref 3.5–5.0)
Alkaline Phosphatase: 79 U/L (ref 38–126)
Anion gap: 9 (ref 5–15)
BUN: 16 mg/dL (ref 6–20)
CO2: 24 mmol/L (ref 22–32)
Calcium: 9.5 mg/dL (ref 8.9–10.3)
Chloride: 104 mmol/L (ref 98–111)
Creatinine, Ser: 0.46 mg/dL (ref 0.44–1.00)
GFR, Estimated: >60 mL/min (ref >60)
Glucose, Bld: 136 mg/dL — ABNORMAL HIGH (ref 70–99)
Potassium: 3.8 mmol/L (ref 3.5–5.1)
Sodium: 137 mmol/L (ref 135–145)
Total Bilirubin: 0.2 mg/dL (ref 0.0–1.2)
Total Protein: 7.9 g/dL (ref 6.5–8.1)

## 2023-04-21 LAB — CBC
HCT: 39.1 % (ref 36.0–46.0)
Hemoglobin: 12.5 g/dL (ref 12.0–15.0)
MCH: 25.8 pg — ABNORMAL LOW (ref 26.0–34.0)
MCHC: 32 g/dL (ref 30.0–36.0)
MCV: 80.6 fL (ref 80.0–100.0)
Platelets: 452 10*3/uL — ABNORMAL HIGH (ref 150–400)
RBC: 4.85 MIL/uL (ref 3.87–5.11)
RDW: 14.3 % (ref 11.5–15.5)
WBC: 9.5 10*3/uL (ref 4.0–10.5)
nRBC: 0 % (ref 0.0–0.2)

## 2023-04-21 LAB — LIPASE, BLOOD: Lipase: 29 U/L (ref 11–51)

## 2023-04-21 LAB — PREGNANCY, URINE: Preg Test, Ur: NEGATIVE

## 2023-04-21 MED ORDER — ONDANSETRON 4 MG PO TBDP: 4.0000 mg | ORAL_TABLET | Freq: Four times a day (QID) | ORAL | 0 refills | Status: DC | PRN

## 2023-04-21 MED ORDER — DICYCLOMINE HCL 20 MG PO TABS: 20.0000 mg | ORAL_TABLET | Freq: Two times a day (BID) | ORAL | 0 refills | Status: DC

## 2023-04-21 NOTE — ED Provider Triage Note (Signed)
 Emergency Medicine Provider Triage Evaluation Note  Deborah Lawson , a 33 y.o. female  was evaluated in triage.  Pt complains of generalized abdominal cramping and body aches.  Review of Systems  Positive: As above  Negative: As above  Physical Exam  BP 125/79   Pulse 76   Temp 98.4 F (36.9 C) (Oral)   Resp 18   Ht 5' 5 (1.651 m)   Wt 89 kg   LMP 03/02/2023 (Approximate)   SpO2 98%   BMI 32.65 kg/m  Gen:   Awake, no distress   Resp:  Normal effort  MSK:   Moves extremities without difficulty  Other:    Medical Decision Making  Medically screening exam initiated at 2:45 PM.  Appropriate orders placed.  Deborah Lawson was informed that the remainder of the evaluation will be completed by another provider, this initial triage assessment does not replace that evaluation, and the importance of remaining in the ED until their evaluation is complete.     Hildegard Loge, PA-C 04/21/23 1445

## 2023-04-21 NOTE — ED Provider Notes (Signed)
 [facility] Provider Note   CSN: [id number] Arrival date & time: 04/21/23  1244     History  Chief Complaint  Patient presents with   Abdominal Pain    Deborah Lawson is a 33 y.o. female with past medical history significant for allergies, Deborah Lawson has some kind of immunosuppression for which Deborah Lawson takes chronic azithromycin  3 times daily, Deborah Lawson reports with some diffuse abdominal pain and diarrhea for 2 days.  Deborah Lawson also endorses body aches.  No cough, sore throat, or recent sick contacts.  Recently treated for urinary tract infection and finished antibiotics.  Deborah Lawson denies any vomiting but does report some mild nausea.  Deborah Lawson denies any blood in her stool.   Abdominal Pain      Home Medications Prior to Admission medications   Medication Sig Start Date End Date Taking? Authorizing Provider  dicyclomine  (BENTYL ) 20 MG tablet Take 1 tablet (20 mg total) by mouth 2 (two) times daily. 04/21/23  Yes Lynnetta Tom H, PA-C  ondansetron  (ZOFRAN -ODT) 4 MG disintegrating tablet Take 1 tablet (4 mg total) by mouth every 6 (six) hours as needed for nausea or vomiting. 04/21/23  Yes Trampas Stettner H, PA-C  acetaminophen  (TYLENOL ) 325 MG tablet Take 650 mg by mouth every 6 (six) hours as needed for moderate pain (pain score 4-6).    [provider]  azelastine  (ASTELIN ) 0.1 % nasal spray Place 2 sprays into both nostrils 2 (two) times daily. Use in each nostril as directed 12/28/22   Iva Marty Saltness, MD  azithromycin  (ZITHROMAX ) 500 MG tablet Take one pill every Monday, Wednesday, and Friday in the morning with food. 04/14/23   Iva Marty Saltness, MD  EPINEPHrine  (EPIPEN  2-PAK) 0.3 mg/0.3 mL IJ SOAJ injection Inject 0.3 mg into the muscle as needed for anaphylaxis. 03/17/23   Iva Marty Saltness, MD  fluticasone  (FLONASE ) 50 MCG/ACT nasal spray Place into both nostrils. 12/21/22   [provider]  furosemide  (LASIX ) 20 MG  tablet Take 1 tablet (20 mg total) by mouth daily. Patient not taking: Reported on 03/03/2023 01/13/23   Loreli Suzen BIRCH, CNM  gabapentin  (NEURONTIN ) 300 MG capsule Take 1 capsule (300 mg total) by mouth at bedtime. 02/24/23   Skeet Juliene SAUNDERS, DO  ibuprofen  (ADVIL ) 600 MG tablet Take 1 tablet (600 mg total) by mouth every 6 (six) hours as needed. 04/08/23   Emilio Delilah HERO, CNM  ibuprofen  (ADVIL ) 600 MG tablet Take 1 tablet (600 mg total) by mouth every 6 (six) hours as needed. 04/08/23   Emilio Delilah HERO, CNM  loperamide  (IMODIUM ) 2 MG capsule TAKE 1 TABLET (2 MG TOTAL) BY MOUTH AS NEEDED FOR DIARRHEA OR LOOSE STOOLS. Patient not taking: Reported on 03/03/2023 02/02/23   Delores Nidia CROME, FNP  meloxicam  (MOBIC ) 7.5 MG tablet TAKE 1 TABLET BY MOUTH EVERY DAY Patient not taking: Reported on 03/03/2023 02/28/23   Theophilus Andrews, Tully GRADE, MD  metoCLOPramide  (REGLAN ) 10 MG tablet Take 10 mg by mouth every 6 (six) hours as needed. Patient not taking: Reported on 03/03/2023 01/30/23   [provider]  NIFEdipine  (ADALAT  CC) 30 MG 24 hr tablet Take 1 tablet (30 mg total) by mouth at bedtime. Patient not taking: Reported on 03/03/2023 01/13/23   Loreli Suzen BIRCH, CNM  nitrofurantoin , macrocrystal-monohydrate, (MACROBID ) 100 MG capsule Take 1 capsule (100 mg total) by mouth 2 (two) times daily. 04/01/23   Vonna Sharlet POUR, MD  norethindrone  (CAMILA ) 0.35 MG tablet Take 1  tablet (0.35 mg total) by mouth daily. 03/24/23   Ajewole, Christana, MD      Allergies    Pork-derived products    Review of Systems   Review of Systems  Gastrointestinal:  Positive for abdominal pain.  All other systems reviewed and are negative.   Physical Exam Updated Vital Signs BP 125/79   Pulse 76   Temp 98.3 F (36.8 C)   Resp 18   Ht 5' 5 (1.651 m)   Wt 89 kg   LMP 03/02/2023 (Approximate)   SpO2 98%   BMI 32.65 kg/m  Physical Exam Vitals and nursing note reviewed.  Constitutional:       General: Deborah Lawson is not in acute distress.    Appearance: Normal appearance.  HENT:     Head: Normocephalic and atraumatic.  Eyes:     General:        Right eye: No discharge.        Left eye: No discharge.  Cardiovascular:     Rate and Rhythm: Normal rate and regular rhythm.  Pulmonary:     Effort: Pulmonary effort is normal. No respiratory distress.  Abdominal:     Comments: Diffuse in the central abdomen, no rebound, rigidity, guarding.  Normal bowel sounds throughout.  Negative Murphy sign.  Negative McBurney's point tenderness.  Musculoskeletal:        General: No deformity.  Skin:    General: Skin is warm and dry.  Neurological:     Mental Status: Deborah Lawson is alert and oriented to person, place, and time.  Psychiatric:        Mood and Affect: Mood normal.        Behavior: Behavior normal.     ED Results / Procedures / Treatments   Labs (all labs ordered are listed, but only abnormal results are displayed) Labs Reviewed  COMPREHENSIVE METABOLIC PANEL - Abnormal; Notable for the following components:      Result Value   Glucose, Bld 136 (*)    All other components within normal limits  CBC - Abnormal; Notable for the following components:   MCH 25.8 (*)    Platelets 452 (*)    All other components within normal limits  URINALYSIS, ROUTINE W REFLEX MICROSCOPIC - Abnormal; Notable for the following components:   Leukocytes,Ua TRACE (*)    All other components within normal limits  RESP PANEL BY RT-PCR (RSV, FLU A&B, COVID)  RVPGX2  LIPASE, BLOOD  PREGNANCY, URINE    EKG None  Radiology No results found.  Procedures Procedures    Medications Ordered in ED Medications - No data to display  ED Course/ Medical Decision Making/ A&P                                 Medical Decision Making Amount and/or Complexity of Data Reviewed Labs: ordered.   This patient is a 33 y.o. female  who presents to the ED for concern of abdominal pain, diarrhea.   Differential  diagnoses prior to evaluation: The emergent differential diagnosis includes, but is not limited to,  The causes of generalized abdominal pain include but are not limited to AAA, mesenteric ischemia, appendicitis, diverticulitis, DKA, gastritis, gastroenteritis, AMI, nephrolithiasis, pancreatitis, peritonitis, adrenal insufficiency,lead poisoning, iron  toxicity, intestinal ischemia, constipation, UTI,SBO/LBO, splenic rupture, biliary disease, IBD, IBS, PUD, or hepatitis . This is not an exhaustive differential.   Past Medical History / Co-morbidities / Social History: Diabetes, hypertension  both during gestation, Deborah Lawson has some chronic immunosuppression and allergy  issues for which Deborah Lawson sees an allergist.  Additional history: Chart reviewed. Pertinent results include: reviewed labwork, imaging from previous ED evaluations  Physical Exam: Physical exam performed. The pertinent findings include: Signs stable in the emergency department, her abdominal exam is benign with some diffuse nonspecific tenderness, no rebound, rigidity, guarding, and normal bowel sounds throughout  Lab Tests/Imaging studies: I personally interpreted labs/imaging and the pertinent results include: CBC with no leukocytosis, mildly elevated platelets of 452 a suggest some degree of concentration versus acute phase reactant.  CMP is overall unremarkable, mildly elevated glucose on nonfasting value.  UA with some squamous cells and trace leukocytes, no bacteria, no nitrites noted, do not think this represents an acute urinary tract infection.  RVP negative for COVID, flu, RSV, pregnancy test negative.  Considered advanced imaging but given benign abdominal exams, vital signs and lab work think that additional imaging is warranted at this time, suspect gastroenteritis, versus reaction to her chronic antibiotic use, no evidence of dehydration or acute electrolyte abnormality.   Medications: Plan for discharge with Bentyl , Zofran , PCP  follow-up, and return precautions given   Disposition: After consideration of the diagnostic results and the patients response to treatment, I feel that patient is stable for discharge with plan as above.   emergency department workup does not suggest an emergent condition requiring admission or immediate intervention beyond what has been performed at this time. The plan is: as above. The patient is safe for discharge and has been instructed to return immediately for worsening symptoms, change in symptoms or any other concerns.  Final Clinical Impression(s) / ED Diagnoses Final diagnoses:  Generalized abdominal pain  Diarrhea, unspecified type    Rx / DC Orders ED Discharge Orders          Ordered    dicyclomine  (BENTYL ) 20 MG tablet  2 times daily        04/21/23 1744    ondansetron  (ZOFRAN -ODT) 4 MG disintegrating tablet  Every 6 hours PRN        04/21/23 1744              Zetta Stoneman, Gardiner H, PA-C 04/21/23 ROSENA Lenor Hollering, MD 04/22/23 1222

## 2023-04-21 NOTE — ED Triage Notes (Signed)
 C/o generalized abd pain w/ diarrhea x 2 days. Also states she has had body aches x 2 days. Denies fever. Recently treated for UTI and finished antibiotics.    Refused interpretor

## 2023-04-21 NOTE — Discharge Instructions (Signed)
 You can take the medication Bentyl  up to twice daily for crampy abdominal pain, you can take the nausea medication up to every 6 hours for nausea.  The nausea medication is somewhat constipating so should also help with diarrhea somewhat.  I recommend that he drink plenty fluids including electrolyte containing fluids such as Pedialyte, Gatorade.

## 2023-04-21 NOTE — ED Notes (Signed)
 Pt alert and oriented X 4 at the time of discharge. RR even and unlabored. No acute distress noted. Pt verbalized understanding of discharge instructions as discussed. Pt ambulatory to lobby at time of discharge.

## 2023-04-22 ENCOUNTER — Other Ambulatory Visit: Payer: Self-pay | Admitting: Internal Medicine

## 2023-04-22 ENCOUNTER — Telehealth: Payer: Self-pay

## 2023-04-22 ENCOUNTER — Other Ambulatory Visit: Payer: Self-pay | Admitting: Advanced Practice Midwife

## 2023-04-22 ENCOUNTER — Other Ambulatory Visit: Payer: Self-pay | Admitting: Certified Nurse Midwife

## 2023-04-22 DIAGNOSIS — G8918 Other acute postprocedural pain: Secondary | ICD-10-CM

## 2023-04-22 NOTE — Telephone Encounter (Addendum)
 Called patient using [employer] Arabic interpreter ID [id number] regarding her BC pills. Patient stated she has not been able to pick up her BC pills because she was told that she was pregnant on filled. UPT was done at her family medicine and it was negative. Informed patient that I will call pharmacy of status of the medication so that she can pick up her BC pills.    1015 Called [facility] pharmacy at [phone] and they stated that patient picked up her BC medication on 04/20/23.  Will message patient that she has refills once she finish this months BC pack.  Rosaline, RN

## 2023-04-25 ENCOUNTER — Telehealth: Payer: Self-pay | Admitting: *Deleted

## 2023-04-25 ENCOUNTER — Ambulatory Visit (INDEPENDENT_AMBULATORY_CARE_PROVIDER_SITE_OTHER): Payer: Self-pay | Admitting: *Deleted

## 2023-04-25 DIAGNOSIS — J309 Allergic rhinitis, unspecified: Secondary | ICD-10-CM | POA: Diagnosis not present

## 2023-04-25 NOTE — Telephone Encounter (Signed)
 Patient came in for her allergy  injection today and stated that sometimes when she takes her antibiotic it will make her nauseas and sometimes throw up. She states that she does take it with food. I spoke with Dr. Cornel Diesel and she stated that it could be a side effect but to discuss with you to see what you would like to do since she has to take the medication three times a week. I advised the patient and she stated that she would like to know of any other side effects with that medication so that she is aware. Patient is aware that she may not hear a response until Wednesday.

## 2023-04-26 ENCOUNTER — Ambulatory Visit: Payer: Medicaid Other | Admitting: Allergy & Immunology

## 2023-04-26 ENCOUNTER — Encounter: Payer: Self-pay | Admitting: Internal Medicine

## 2023-04-26 ENCOUNTER — Ambulatory Visit (INDEPENDENT_AMBULATORY_CARE_PROVIDER_SITE_OTHER): Payer: Medicaid Other | Admitting: Internal Medicine

## 2023-04-26 VITALS — BP 110/70 | HR 103 | Temp 98.3°F | Wt 208.2 lb

## 2023-04-26 DIAGNOSIS — E559 Vitamin D deficiency, unspecified: Secondary | ICD-10-CM

## 2023-04-26 DIAGNOSIS — R5383 Other fatigue: Secondary | ICD-10-CM | POA: Diagnosis not present

## 2023-04-26 DIAGNOSIS — D509 Iron deficiency anemia, unspecified: Secondary | ICD-10-CM | POA: Diagnosis not present

## 2023-04-26 LAB — CBC WITH DIFFERENTIAL/PLATELET
Basophils Absolute: 0.1 10*3/uL (ref 0.0–0.1)
Basophils Relative: 0.8 % (ref 0.0–3.0)
Eosinophils Absolute: 0.8 10*3/uL — ABNORMAL HIGH (ref 0.0–0.7)
Eosinophils Relative: 10.1 % — ABNORMAL HIGH (ref 0.0–5.0)
HCT: 38.8 % (ref 36.0–46.0)
Hemoglobin: 12.7 g/dL (ref 12.0–15.0)
Lymphocytes Relative: 39 % (ref 12.0–46.0)
Lymphs Abs: 3.2 10*3/uL (ref 0.7–4.0)
MCHC: 32.8 g/dL (ref 30.0–36.0)
MCV: 80.3 fL (ref 78.0–100.0)
Monocytes Absolute: 0.6 10*3/uL (ref 0.1–1.0)
Monocytes Relative: 6.9 % (ref 3.0–12.0)
Neutro Abs: 3.5 10*3/uL (ref 1.4–7.7)
Neutrophils Relative %: 43.2 % (ref 43.0–77.0)
Platelets: 449 10*3/uL — ABNORMAL HIGH (ref 150.0–400.0)
RBC: 4.83 Mil/uL (ref 3.87–5.11)
RDW: 15 % (ref 11.5–15.5)
WBC: 8.2 10*3/uL (ref 4.0–10.5)

## 2023-04-26 LAB — IBC + FERRITIN
Ferritin: 13.1 ng/mL (ref 10.0–291.0)
Iron: 32 ug/dL — ABNORMAL LOW (ref 42–145)
Saturation Ratios: 7.2 % — ABNORMAL LOW (ref 20.0–50.0)
TIBC: 443.8 ug/dL (ref 250.0–450.0)
Transferrin: 317 mg/dL (ref 212.0–360.0)

## 2023-04-26 LAB — VITAMIN D 25 HYDROXY (VIT D DEFICIENCY, FRACTURES): VITD: 19.15 ng/mL — ABNORMAL LOW (ref 30.00–100.00)

## 2023-04-26 NOTE — Progress Notes (Signed)
Established Patient Office Visit     CC/Reason for Visit: ED follow-up visit  HPI: Deborah Lawson is a 33 y.o. female who is coming in today for the above mentioned reasons.  She was seen twice in the emergency department since I last saw her.  The first time on January 17 she was treated for UTI the second time she came in on February 6 for abdominal pain without significant etiology that has already improved without treatment.  Her latest child is now 65 months old.  She is complaining of fatigue right sided neck shoulder and upper back pain.  She has been under the care of an allergist.  They have started her on azithromycin 3 days a week for reasons that are not exactly clear to me.  She is wondering whether she needs to discontinue it because it is causing her nausea and diarrhea.  She was told to take this azithromycin until July.   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   Vitamin D deficiency 11/11/2020    Past Surgical History:  Procedure Laterality Date   CESAREAN SECTION N/A 10/26/2019   Procedure: CESAREAN SECTION;  Surgeon: Reva Bores, MD;  Location: [facility];  Service: Obstetrics;  Laterality: N/A;   CESAREAN SECTION N/A 01/10/2023   Procedure: CESAREAN SECTION;  Surgeon: Milas Hock, MD;  Location: [facility];  Service: Obstetrics;  Laterality: N/A;   LAPAROSCOPIC APPENDECTOMY N/A 02/05/2023   Procedure: APPENDECTOMY LAPAROSCOPIC;  Surgeon: Andria Meuse, MD;  Location: [facility] OR;  Service: General;  Laterality: N/A;   LAPAROSCOPIC LYSIS OF ADHESIONS N/A 02/05/2023   Procedure: LAPAROSCOPIC LYSIS OF ADHESIONS;  Surgeon: Andria Meuse, MD;  Location: [facility] OR;  Service:  General;  Laterality: N/A;   MANDIBLE SURGERY      Social History:  reports that she has never smoked. She has never been exposed to tobacco smoke. She has never used smokeless tobacco. She reports that she does not drink alcohol and does not use drugs.  Allergies: Allergies  Allergen Reactions   Pork-Derived Products     Religious    Family History:  Family History  Problem Relation Age of Onset   Allergic rhinitis Mother    Hypertension Father      Current Outpatient Medications:    acetaminophen (TYLENOL) 325 MG tablet, Take 650 mg by mouth every 6 (six) hours as needed for moderate pain (pain score 4-6)., Disp: , Rfl:    azelastine (ASTELIN) 0.1 % nasal spray, Place 2 sprays into both nostrils 2 (two) times daily. Use in each nostril as directed, Disp: 30 mL, Rfl: 5   azithromycin (ZITHROMAX) 500 MG tablet, Take one pill every Monday, Wednesday, and Friday in the morning with food., Disp: 40 tablet, Rfl: 1   EPINEPHrine (EPIPEN 2-PAK) 0.3 mg/0.3 mL IJ SOAJ injection, Inject 0.3 mg into the muscle as needed for anaphylaxis., Disp: 0.3 mL, Rfl: 1   fluticasone (FLONASE) 50 MCG/ACT nasal spray, Place into both nostrils., Disp: , Rfl:    gabapentin (NEURONTIN) 300 MG capsule, Take 1 capsule (300 mg total) by mouth at bedtime., Disp: 30 capsule, Rfl: 5   ibuprofen (ADVIL) 600 MG tablet, Take 1 tablet (600 mg total) by mouth  every 6 (six) hours as needed., Disp: 30 tablet, Rfl: 0   ibuprofen (ADVIL) 600 MG tablet, Take 1 tablet (600 mg total) by mouth every 6 (six) hours as needed., Disp: 30 tablet, Rfl: 0   loperamide (IMODIUM) 2 MG capsule, TAKE 1 TABLET (2 MG TOTAL) BY MOUTH AS NEEDED FOR DIARRHEA OR LOOSE STOOLS., Disp: 30 capsule, Rfl: 0   meloxicam (MOBIC) 7.5 MG tablet, TAKE 1 TABLET BY MOUTH EVERY DAY, Disp: 30 tablet, Rfl: 0   NIFEdipine (ADALAT CC) 30 MG 24 hr tablet, Take 1 tablet (30 mg total) by mouth at bedtime., Disp: 30 tablet, Rfl: 1   norethindrone (CAMILA) 0.35 MG  tablet, Take 1 tablet (0.35 mg total) by mouth daily., Disp: 28 tablet, Rfl: 6   ondansetron (ZOFRAN-ODT) 4 MG disintegrating tablet, Take 1 tablet (4 mg total) by mouth every 6 (six) hours as needed for nausea or vomiting., Disp: 20 tablet, Rfl: 0   dicyclomine (BENTYL) 20 MG tablet, Take 1 tablet (20 mg total) by mouth 2 (two) times daily., Disp: 20 tablet, Rfl: 0  Review of Systems:  Negative unless indicated in HPI.   Physical Exam: Vitals:   04/26/23 0859  BP: 110/70  Pulse: (!) 103  Temp: 98.3 F (36.8 C)  TempSrc: Oral  SpO2: 99%  Weight: 208 lb 3.2 oz (94.4 kg)    Body mass index is 34.65 kg/m.    Impression and Plan:  Vitamin D deficiency -     VITAMIN D 25 Hydroxy (Vit-D Deficiency, Fractures); Future  Fatigue, unspecified type  Iron deficiency anemia, unspecified iron deficiency anemia type -     CBC with Differential/Platelet; Future -     IBC + Ferritin; Future   -Check hemoglobin and iron studies. -Check vitamin D levels. -I suspect her fatigue just has to do with sleepless nights with a newborn baby and many other young children.  Also her neck shoulder and upper back pain are likely related to carrying the baby and positioning for breast-feeding. -I have told her that I cannot ask her to discontinue the azithromycin, although I am not exactly clear as to why this was started.  Have asked her to reach out to her allergist for further recommendations.  Time spent:30 minutes reviewing chart, interviewing and examining patient and formulating plan of care.     Chaya Jan, MD [facility]

## 2023-04-27 ENCOUNTER — Encounter: Payer: Self-pay | Admitting: Internal Medicine

## 2023-04-27 ENCOUNTER — Other Ambulatory Visit: Payer: Self-pay | Admitting: Internal Medicine

## 2023-04-27 ENCOUNTER — Encounter: Payer: Self-pay | Admitting: *Deleted

## 2023-04-27 DIAGNOSIS — E559 Vitamin D deficiency, unspecified: Secondary | ICD-10-CM

## 2023-04-27 DIAGNOSIS — D509 Iron deficiency anemia, unspecified: Secondary | ICD-10-CM

## 2023-04-27 MED ORDER — IRON (FERROUS SULFATE) 325 (65 FE) MG PO TABS: 325.0000 mg | ORAL_TABLET | Freq: Two times a day (BID) | ORAL | Status: DC

## 2023-04-27 MED ORDER — VITAMIN D (ERGOCALCIFEROL) 1.25 MG (50000 UNIT) PO CAPS: 50000.0000 [IU] | ORAL_CAPSULE | ORAL | 0 refills | Status: DC

## 2023-04-27 NOTE — Telephone Encounter (Signed)
MyChart message has been sent to the patient per her request when we spoke in person.

## 2023-04-27 NOTE — Telephone Encounter (Signed)
That is a known side effect. It can also affect the heart (increases the timing between certain beats) in higher doses, but this is less common with the dose she is on. Let's try decreasing to 250mg  on M/W/F.   Can someone call and tell her this? She can just cut the current dose in half. Let's do that for a couple of weeks and see how it goes.

## 2023-04-28 ENCOUNTER — Encounter: Payer: Self-pay | Admitting: Allergy & Immunology

## 2023-04-28 ENCOUNTER — Ambulatory Visit: Payer: Medicaid Other | Admitting: Family Medicine

## 2023-04-28 ENCOUNTER — Other Ambulatory Visit: Payer: Self-pay

## 2023-04-28 ENCOUNTER — Encounter: Payer: Self-pay | Admitting: Internal Medicine

## 2023-04-28 ENCOUNTER — Ambulatory Visit (INDEPENDENT_AMBULATORY_CARE_PROVIDER_SITE_OTHER): Payer: Medicaid Other | Admitting: Allergy & Immunology

## 2023-04-28 VITALS — BP 110/76 | HR 93 | Temp 98.0°F | Resp 18 | Wt 211.6 lb

## 2023-04-28 DIAGNOSIS — J3089 Other allergic rhinitis: Secondary | ICD-10-CM

## 2023-04-28 DIAGNOSIS — D806 Antibody deficiency with near-normal immunoglobulins or with hyperimmunoglobulinemia: Secondary | ICD-10-CM | POA: Diagnosis not present

## 2023-04-28 DIAGNOSIS — J302 Other seasonal allergic rhinitis: Secondary | ICD-10-CM

## 2023-04-28 MED ORDER — AZITHROMYCIN 250 MG PO TABS: ORAL_TABLET | ORAL | 5 refills | Status: DC

## 2023-04-28 NOTE — Progress Notes (Signed)
FOLLOW UP  Date of Service/Encounter:  04/28/23   Assessment:   Perennial allergic rhinitis - dust mites only (on allergy immunotherapy, started January 2025 and has not reached maintenance)   Recurrent infections - mostly sinopulmonary in nature, but with poor response to Pneumovax (on prophylactic azithromycin, decreasing dose to 250mg  three times weekly  GI upset with azithromycin - recommended cutting the dose in half  Plan/Recommendations:   1. Chronic rhinitis - Testing at the last visit showed: dust mites - Get the dust mite coverings for the bed and pillows to see if this helps.  - Continue with: Astelin (azelastine) 2 sprays per nostril 1-2 times daily as needed - You can use an extra dose of the antihistamine, if needed, for breakthrough symptoms.  - Consider nasal saline rinses 1-2 times daily to remove allergens from the nasal cavities as well as help with mucous clearance (this is especially helpful to do before the nasal sprays are given) - Consider allergy shots as a means of long-term control. - Allergy shots "re-train" and "reset" the immune system to ignore environmental allergens and decrease the resulting immune response to those allergens (sneezing, itchy watery eyes, runny nose, nasal congestion, etc).    - Allergy shots improve symptoms in 75-85% of patients.  - We can discuss more at the next appointment if the medications are not working for you.  2. Recurrent infections - You were only protective 6/23 strains of Streptococcus pneumonia.  - This is not a great response and could be a sign of specific antibody deficiency.  - Try decreasing the azithromycin to half a tablet (250 mg) every Monday/Wednesday/Friday.  - You can do this via intravenous route (once a month) or at home via subcutaneous route (every other week). - Learn more here:   [email or web address]  -  Continue to monitor infections and we can be more aggressive if needed in the future.  - Think about your options and let us know what you want to do.   3. Return in about 3 months (around 07/26/2023).    Subjective:   Deborah Lawson is a 33 y.o. female presenting today for follow up of  Chief Complaint  Patient presents with   Allergic Reaction    Stomach pain, occasional vomiting, and shaking with antibiotics.     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   Discussed the use of AI scribe software for clinical note transcription with the patient and/or guardian, who gave verbal consent to proceed.  Deborah Lawson is a 33 y.o. female presenting for a follow up visit.  She was last seen in January 2025.  At that time, she continued on Astelin 2 sprays per nostril up to twice daily.  She also decided to start allergy shots  for dust mite allergy.  For her history of recurrent infections, she was only protective to 6 out of 23 strains of Streptococcus pneumonia.  This was not a great response, so we talked about immunoglobulin replacement versus prophylactic antibiotics.  She preferred to start the prophylactic antibiotic instead.  Since the last visit,  she has been about the same.   Allergic Rhinitis Symptom History: She remains on her allergy shots which are going well. She is also using the Astelin nasal spray 1-2 times daily. She is not sure that the allergy shots at doing much at this point. She has not had any large local reactions.   [name] is on allergen immunotherapy. She receives one injection. Immunotherapy script #1 contains dust mites. She currently receives 0.18mL of the BLUE vial (1/100,000). She started shots January of 2025 and not yet reached maintenance. She has not had any large local reactions at all.   Infection Symptom History: She has been on her azithromycin three times weekly. She reports that she is having symptoms consistent with stomach pain from her azithromycin. She is having some vomiting as well. This is her second week of the antibiotics. She has a lot of questions about side effects and we spent some more time discussing the immunoglobulin therapy versus prophylactic antibiotics. She is wondering whether she needs to do this life long.   Otherwise, there have been no changes to her past medical history, surgical history, family history, or social history.    Review of systems otherwise negative other than that mentioned in the HPI.    Objective:   Blood pressure 110/76, pulse 93, temperature 98 F (36.7 C), temperature source Temporal, resp. rate 18, weight 211 lb 9.6 oz (96 kg), last menstrual period 03/02/2023, SpO2 99%, currently breastfeeding. Body mass index is 35.21 kg/m.    Physical Exam Vitals reviewed.  Constitutional:      Appearance: She is well-developed.     Comments: Moving air well in all lung fields.  No increased work of breathing.  HENT:     Head: Normocephalic and atraumatic.     Right Ear: Tympanic membrane, ear canal and external ear normal. No drainage, swelling or tenderness. Tympanic membrane is not injected, scarred, erythematous, retracted or bulging.     Left Ear: Tympanic  membrane, ear canal and external ear normal. No drainage, swelling or tenderness. Tympanic membrane is not injected, scarred, erythematous, retracted or bulging.     Nose: Rhinorrhea present. No nasal deformity, septal deviation or mucosal edema.     Right Turbinates: Enlarged, swollen and pale.     Left Turbinates: Enlarged, swollen and pale.     Right Sinus: No maxillary sinus tenderness or frontal sinus tenderness.     Left Sinus: No maxillary sinus tenderness or frontal sinus tenderness.     Comments: No polyps.     Mouth/Throat:     Mouth: Mucous membranes are not pale and not dry.     Pharynx: Uvula midline.  Eyes:     General:        Right eye: No discharge.        Left eye: No discharge.     Conjunctiva/sclera: Conjunctivae normal.     Right eye: Right conjunctiva is not injected. No chemosis.    Left eye: Left conjunctiva is not injected. No chemosis.    Pupils: Pupils are equal, round, and reactive to light.  Cardiovascular:     Rate and Rhythm: Normal rate and regular rhythm.  Heart sounds: Normal heart sounds.  Pulmonary:     Effort: Pulmonary effort is normal. No tachypnea, accessory muscle usage or respiratory distress.     Breath sounds: Normal breath sounds. No wheezing, rhonchi or rales.     Comments: Moving air well in al lung fields. No increased work of breathing noted.  Chest:     Chest wall: No tenderness.  Lymphadenopathy:     Head:     Right side of head: No submandibular, tonsillar or occipital adenopathy.     Left side of head: No submandibular, tonsillar or occipital adenopathy.     Cervical: No cervical adenopathy.  Skin:    General: Skin is warm.     Capillary Refill: Capillary refill takes less than 2 seconds.     Coloration: Skin is not pale.     Findings: No abrasion, erythema, petechiae or rash. Rash is not papular, urticarial or vesicular.     Comments: No eczematous or urticarial lesions noted.  Neurological:     Mental Status: She is  alert.  Psychiatric:        Behavior: Behavior is cooperative.      Diagnostic studies: none      Malachi Bonds, MD  [facility]

## 2023-04-28 NOTE — Patient Instructions (Addendum)
1. Chronic rhinitis - Testing at the last visit showed: dust mites - Get the dust mite coverings for the bed and pillows to see if this helps.  - Continue with: Astelin (azelastine) 2 sprays per nostril 1-2 times daily as needed - You can use an extra dose of the antihistamine, if needed, for breakthrough symptoms.  - Consider nasal saline rinses 1-2 times daily to remove allergens from the nasal cavities as well as help with mucous clearance (this is especially helpful to do before the nasal sprays are given) - Consider allergy shots as a means of long-term control. - Allergy shots "re-train" and "reset" the immune system to ignore environmental allergens and decrease the resulting immune response to those allergens (sneezing, itchy watery eyes, runny nose, nasal congestion, etc).    - Allergy shots improve symptoms in 75-85% of patients.  - We can discuss more at the next appointment if the medications are not working for you.  2. Recurrent infections - You were only protective 6/23 strains of Streptococcus pneumonia.  - This is not a great response and could be a sign of specific antibody deficiency.  - Try decreasing the azithromycin to half a tablet (250 mg) every Monday/Wednesday/Friday.  - You can do this via intravenous route (once a month) or at home via subcutaneous route (every other week). - Learn more here:   [email or web address]  - Continue to monitor infections and we can be more aggressive if needed in the future.  - Think about your options and let us know what you want to do.   3. Return in about 3 months (around 07/26/2023).    Please inform us of any Emergency Department visits, hospitalizations, or changes in symptoms. Call us before going to the ED for breathing or allergy symptoms since we might be able to fit you in for a sick visit. Feel free to contact us anytime with any  questions, problems, or concerns.  It was a pleasure to see you again today!  Websites that have reliable patient information: 1. American Academy of Asthma, Allergy, and Immunology: www.aaaai.org 2. Food Allergy Research and Education (FARE): foodallergy.org 3. Mothers of Asthmatics: http://www.asthmacommunitynetwork.org 4. American College of Allergy, Asthma, and Immunology: www.acaai.org      "Like" [place] on Facebook and Instagram for our latest updates!      A healthy democracy works best when [employer] participate! Make sure you are registered to vote! If you have moved or changed any of your contact information, you will need to get this updated before voting! Scan the QR codes below to learn more!            Control of Dust Mite Allergen    Dust mites play a major role in allergic asthma and rhinitis.  They occur in environments with high humidity wherever human skin is found.  Dust mites absorb humidity from the atmosphere (ie, they do not drink) and feed on organic matter (including shed human and animal skin).  Dust mites are a microscopic type of insect that you cannot see with the naked eye.  High levels of dust mites have been detected from mattresses, pillows, carpets, upholstered furniture, bed covers, clothes, soft toys and any woven material.  The principal allergen of the dust mite is found in its feces.  A gram of dust may contain 1,000 mites and 250,000 fecal particles.  Mite antigen is easily measured in the air during house cleaning activities.  Dust mites do not bite and do  not cause harm to humans, other than by triggering allergies/asthma.    Ways to decrease your exposure to dust mites in your home:  Encase mattresses, box springs and pillows with a mite-impermeable barrier or cover   Wash sheets, blankets and drapes weekly in hot water (130 F) with detergent and dry them in a dryer on the hot setting.  Have the room cleaned frequently with a vacuum  cleaner and a damp dust-mop.  For carpeting or rugs, vacuuming with a vacuum cleaner equipped with a high-efficiency particulate air (HEPA) filter.  The dust mite allergic individual should not be in a room which is being cleaned and should wait 1 hour after cleaning before going into the room. Do not sleep on upholstered furniture (eg, couches).   If possible removing carpeting, upholstered furniture and drapery from the home is ideal.  Horizontal blinds should be eliminated in the rooms where the person spends the most time (bedroom, study, television room).  Washable vinyl, roller-type shades are optimal. Remove all non-washable stuffed toys from the bedroom.  Wash stuffed toys weekly like sheets and blankets above.   Reduce indoor humidity to less than 50%.  Inexpensive humidity monitors can be purchased at most hardware stores.  Do not use a humidifier as can make the problem worse and are not recommended.

## 2023-05-03 ENCOUNTER — Ambulatory Visit (INDEPENDENT_AMBULATORY_CARE_PROVIDER_SITE_OTHER): Payer: Self-pay | Admitting: *Deleted

## 2023-05-03 DIAGNOSIS — J309 Allergic rhinitis, unspecified: Secondary | ICD-10-CM | POA: Diagnosis not present

## 2023-05-06 ENCOUNTER — Ambulatory Visit (INDEPENDENT_AMBULATORY_CARE_PROVIDER_SITE_OTHER): Payer: Medicaid Other

## 2023-05-06 DIAGNOSIS — J309 Allergic rhinitis, unspecified: Secondary | ICD-10-CM | POA: Diagnosis not present

## 2023-05-09 ENCOUNTER — Ambulatory Visit (INDEPENDENT_AMBULATORY_CARE_PROVIDER_SITE_OTHER): Payer: Self-pay | Admitting: *Deleted

## 2023-05-09 DIAGNOSIS — J309 Allergic rhinitis, unspecified: Secondary | ICD-10-CM

## 2023-05-12 ENCOUNTER — Ambulatory Visit (INDEPENDENT_AMBULATORY_CARE_PROVIDER_SITE_OTHER): Payer: Self-pay

## 2023-05-12 DIAGNOSIS — J309 Allergic rhinitis, unspecified: Secondary | ICD-10-CM | POA: Diagnosis not present

## 2023-05-16 ENCOUNTER — Encounter: Payer: Self-pay | Admitting: Obstetrics and Gynecology

## 2023-05-17 ENCOUNTER — Ambulatory Visit (INDEPENDENT_AMBULATORY_CARE_PROVIDER_SITE_OTHER): Payer: Self-pay

## 2023-05-17 DIAGNOSIS — J309 Allergic rhinitis, unspecified: Secondary | ICD-10-CM

## 2023-05-20 ENCOUNTER — Ambulatory Visit (INDEPENDENT_AMBULATORY_CARE_PROVIDER_SITE_OTHER): Payer: Self-pay | Admitting: *Deleted

## 2023-05-20 DIAGNOSIS — J309 Allergic rhinitis, unspecified: Secondary | ICD-10-CM

## 2023-05-24 ENCOUNTER — Ambulatory Visit (INDEPENDENT_AMBULATORY_CARE_PROVIDER_SITE_OTHER): Payer: Self-pay | Admitting: *Deleted

## 2023-05-24 DIAGNOSIS — J309 Allergic rhinitis, unspecified: Secondary | ICD-10-CM

## 2023-05-25 ENCOUNTER — Other Ambulatory Visit: Payer: Self-pay | Admitting: Internal Medicine

## 2023-05-25 DIAGNOSIS — M549 Dorsalgia, unspecified: Secondary | ICD-10-CM

## 2023-05-25 DIAGNOSIS — R0789 Other chest pain: Secondary | ICD-10-CM

## 2023-05-26 ENCOUNTER — Ambulatory Visit (INDEPENDENT_AMBULATORY_CARE_PROVIDER_SITE_OTHER)

## 2023-05-26 ENCOUNTER — Other Ambulatory Visit: Payer: Self-pay | Admitting: Internal Medicine

## 2023-05-26 DIAGNOSIS — M549 Dorsalgia, unspecified: Secondary | ICD-10-CM

## 2023-05-26 DIAGNOSIS — J309 Allergic rhinitis, unspecified: Secondary | ICD-10-CM

## 2023-05-26 DIAGNOSIS — R0789 Other chest pain: Secondary | ICD-10-CM

## 2023-05-31 ENCOUNTER — Ambulatory Visit (INDEPENDENT_AMBULATORY_CARE_PROVIDER_SITE_OTHER): Payer: Self-pay | Admitting: *Deleted

## 2023-05-31 DIAGNOSIS — J309 Allergic rhinitis, unspecified: Secondary | ICD-10-CM | POA: Diagnosis not present

## 2023-06-03 ENCOUNTER — Ambulatory Visit (INDEPENDENT_AMBULATORY_CARE_PROVIDER_SITE_OTHER): Payer: Self-pay

## 2023-06-03 DIAGNOSIS — J309 Allergic rhinitis, unspecified: Secondary | ICD-10-CM | POA: Diagnosis not present

## 2023-06-07 ENCOUNTER — Ambulatory Visit (INDEPENDENT_AMBULATORY_CARE_PROVIDER_SITE_OTHER): Payer: Self-pay | Admitting: *Deleted

## 2023-06-07 DIAGNOSIS — J309 Allergic rhinitis, unspecified: Secondary | ICD-10-CM

## 2023-06-10 ENCOUNTER — Ambulatory Visit (INDEPENDENT_AMBULATORY_CARE_PROVIDER_SITE_OTHER): Payer: Self-pay

## 2023-06-10 DIAGNOSIS — J309 Allergic rhinitis, unspecified: Secondary | ICD-10-CM

## 2023-06-15 ENCOUNTER — Ambulatory Visit (INDEPENDENT_AMBULATORY_CARE_PROVIDER_SITE_OTHER): Payer: Self-pay | Admitting: *Deleted

## 2023-06-15 DIAGNOSIS — J309 Allergic rhinitis, unspecified: Secondary | ICD-10-CM | POA: Diagnosis not present

## 2023-06-17 ENCOUNTER — Ambulatory Visit (INDEPENDENT_AMBULATORY_CARE_PROVIDER_SITE_OTHER)

## 2023-06-17 DIAGNOSIS — J309 Allergic rhinitis, unspecified: Secondary | ICD-10-CM

## 2023-06-23 ENCOUNTER — Ambulatory Visit (INDEPENDENT_AMBULATORY_CARE_PROVIDER_SITE_OTHER): Payer: Self-pay

## 2023-06-23 DIAGNOSIS — J309 Allergic rhinitis, unspecified: Secondary | ICD-10-CM

## 2023-06-24 ENCOUNTER — Other Ambulatory Visit: Payer: Self-pay | Admitting: Internal Medicine

## 2023-06-24 ENCOUNTER — Other Ambulatory Visit: Payer: Self-pay | Admitting: Certified Nurse Midwife

## 2023-06-24 DIAGNOSIS — G8918 Other acute postprocedural pain: Secondary | ICD-10-CM

## 2023-06-24 DIAGNOSIS — R0789 Other chest pain: Secondary | ICD-10-CM

## 2023-06-24 DIAGNOSIS — M549 Dorsalgia, unspecified: Secondary | ICD-10-CM

## 2023-06-28 ENCOUNTER — Ambulatory Visit (INDEPENDENT_AMBULATORY_CARE_PROVIDER_SITE_OTHER): Payer: Self-pay

## 2023-06-28 DIAGNOSIS — J309 Allergic rhinitis, unspecified: Secondary | ICD-10-CM

## 2023-07-07 ENCOUNTER — Ambulatory Visit (INDEPENDENT_AMBULATORY_CARE_PROVIDER_SITE_OTHER): Payer: Self-pay

## 2023-07-07 DIAGNOSIS — J309 Allergic rhinitis, unspecified: Secondary | ICD-10-CM | POA: Diagnosis not present

## 2023-07-11 ENCOUNTER — Encounter (HOSPITAL_COMMUNITY): Payer: Self-pay

## 2023-07-11 ENCOUNTER — Ambulatory Visit (HOSPITAL_COMMUNITY)
Admission: EM | Admit: 2023-07-11 | Discharge: 2023-07-11 | Disposition: A | Discharge status: Home / Self Care | Attending: Physician Assistant | Admitting: Physician Assistant

## 2023-07-11 DIAGNOSIS — A084 Viral intestinal infection, unspecified: Secondary | ICD-10-CM | POA: Insufficient documentation

## 2023-07-11 DIAGNOSIS — R110 Nausea: Secondary | ICD-10-CM | POA: Insufficient documentation

## 2023-07-11 LAB — POCT URINALYSIS DIP (MANUAL ENTRY)
Bilirubin, UA: NEGATIVE
Glucose, UA: NEGATIVE mg/dL
Ketones, POC UA: NEGATIVE mg/dL
Nitrite, UA: NEGATIVE
Protein Ur, POC: NEGATIVE mg/dL
Spec Grav, UA: 1.015 (ref 1.010–1.025)
Urobilinogen, UA: 0.2 U/dL
pH, UA: 5.5 (ref 5.0–8.0)

## 2023-07-11 LAB — POCT URINE PREGNANCY: Preg Test, Ur: NEGATIVE

## 2023-07-11 MED ORDER — ONDANSETRON 4 MG PO TBDP
ORAL_TABLET | ORAL | Status: AC
Start: 2023-07-11 — End: ?
  Filled 2023-07-11: qty 1

## 2023-07-11 MED ORDER — ONDANSETRON 4 MG PO TBDP
4.0000 mg | ORAL_TABLET | Freq: Three times a day (TID) | ORAL | 0 refills | Status: DC | PRN
Start: 2023-07-11 — End: 2023-07-21

## 2023-07-11 MED ORDER — ONDANSETRON 4 MG PO TBDP
4.0000 mg | ORAL_TABLET | Freq: Once | ORAL | Status: AC
  Administered 2023-07-11: 4 mg via ORAL

## 2023-07-11 NOTE — Discharge Instructions (Signed)
 Use the Zofran  every 8 hours as needed.  It is unclear if this medication is passed through breastmilk so I do recommend using it as infrequently as possible.  Make sure you drink plenty of fluid and eat a bland diet.  Avoid any spicy/acidic/fatty foods.  If you have any severe abdominal pain, nausea/vomiting despite the medication, fever, blood in your stool, blood in your vomit, weakness you need to go to the emergency room.  ??????? ?????? ?? 8 ????? ??? ??????. ?? ??? ?????? ?? ??? ??? ??? ?????? ????? ??? ???? ????? ??? ???? ????????? ??? ????? ??????? ??? ???????. ????? ??? ??? ????? ????? ?? ??????? ?????? ???? ????? ?????. ????? ??????? ??????/???????/???????. ?? ??? ?????? ???? ???? ?? ?????? ?? ?????/???? ??? ????? ??????? ?? ?????? ?? ???? ???????? ?? ???? ?? ?? ??????? ?? ?????? ?? ?????? ??? ????? ?????? ??? ??? ???????. aistakhdimi zufran kula 8 saeat hasab alhajati. min ghayr alwadih ma 'iidha kan hadha aldawa' yantaqil eabr halib al'umi, lidha 'ansah biaistikhdamih ealaa fatarat mutabaeidat qadr Aurelia Leeks. aihrsi ealaa shurb kamiyaat kabirat min alsawayil watanawal nizam ghidhayiyin muetadilin. tajanubii al'ateimat alharata/alhamidat/aldhnia. fi hal alshueur bi'alam shadid fi albatni, 'aw ghathian/tqyw raghm tanawul aldawa'i, 'aw airtifae fi darajat alhararati, 'aw wujud dam fi albarazi, 'aw alqay', 'aw aldaefi, yajib elyk altawajuh 'iilaa qism altawarii.

## 2023-07-11 NOTE — ED Triage Notes (Signed)
 Patient c/o abdominal pain and diarrhea x 2 today.  Patient denies taking any medication for her symptoms.

## 2023-07-11 NOTE — ED Provider Notes (Signed)
 [facility]    CSN: [id number] Arrival date & time: 07/11/23  1545      History   Chief Complaint Chief Complaint  Patient presents with   Abdominal Pain   Diarrhea    HPI Deborah Lawson is a 33 y.o. female.   Patient presents today with a several hour history of GI symptoms.  She reports abdominal pain, diarrhea, nausea.  Denies any vomiting, fever, cough, congestion, shortness of breath, chest pain.  Reports that she has a daughter that was recently diagnosed with viral gastroenteritis.  Denies additional known sick contacts.  She denies any recent travel, changes to diet, medication changes, antibiotic use.  Denies history of gastrointestinal disorder.  She does report having 2 C-sections and appendectomy but denies additional abdominal surgery.  She does not believe that she is pregnant but is unsure so is open to testing.  She reports that abdominal discomfort is rated 6 on a 0-10 pain scale, described as cramping, no aggravating alleviating factors identified.  She is breast-feeding.    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) 04/28/2023   Seasonal and perennial allergic rhinitis 04/28/2023   Gestational hypertension 01/11/2023   Pregnancy 01/10/2023   Cesarean delivery delivered 01/10/2023   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   History of cesarean delivery, currently pregnant 07/07/2022   Supervision of high risk pregnancy, antepartum 06/23/2022   White matter abnormality on MRI of brain 07/06/2021    Vitamin D  deficiency 11/11/2020   Iron  deficiency 11/11/2020   History of gestational diabetes in prior pregnancy, currently pregnant    History of gestational hypertension    Language barrier 10/12/2019    Past Surgical History:  Procedure Laterality Date   CESAREAN SECTION N/A 10/26/2019   Procedure: CESAREAN SECTION;  Surgeon: Granville Layer, MD;  Location: [facility];  Service: Obstetrics;  Laterality: N/A;   CESAREAN SECTION N/A 01/10/2023   Procedure: CESAREAN SECTION;  Surgeon: Lacey Pian, MD;  Location: [facility];  Service: Obstetrics;  Laterality: N/A;   LAPAROSCOPIC APPENDECTOMY N/A 02/05/2023   Procedure: APPENDECTOMY LAPAROSCOPIC;  Surgeon: Melvenia Stabs, MD;  Location: [facility];  Service: General;  Laterality: N/A;   LAPAROSCOPIC LYSIS OF ADHESIONS N/A 02/05/2023   Procedure: LAPAROSCOPIC LYSIS OF ADHESIONS;  Surgeon: Melvenia Stabs, MD;  Location: [facility];  Service: General;  Laterality: N/A;   MANDIBLE SURGERY      OB History     Gravida  5   Para  4   Term  4   Preterm  0   AB  1   Living  4      SAB  1   IAB  0   Ectopic  0   Multiple  0   Live Births  4            Home Medications    Prior to Admission medications   Medication Sig Start Date End Date Taking? Authorizing Provider  ondansetron  (ZOFRAN -ODT) 4 MG disintegrating tablet Take 1 tablet (4 mg total) by mouth  every 8 (eight) hours as needed for nausea or vomiting. 07/11/23  Yes Rishav Rockefeller K, PA-C  acetaminophen  (TYLENOL ) 325 MG tablet Take 650 mg by mouth every 6 (six) hours as needed for moderate pain (pain score 4-6).    [provider]  cromolyn (OPTICROM) 4 % ophthalmic solution SMARTSIG:1 Drop(s) In Eye(s) Twice Daily PRN 03/21/23   [provider]  dicyclomine  (BENTYL ) 20 MG tablet Take 1 tablet (20 mg total) by mouth 2 (two) times daily. 04/21/23   Prosperi, Christian H, PA-C  EPINEPHrine  (EPIPEN  2-PAK) 0.3 mg/0.3 mL IJ SOAJ injection Inject 0.3 mg  into the muscle as needed for anaphylaxis. 03/17/23   Rochester Chuck, MD  fluticasone  (FLONASE ) 50 MCG/ACT nasal spray Place into both nostrils. 12/21/22   [provider]  ibuprofen  (ADVIL ) 600 MG tablet Take 1 tablet (600 mg total) by mouth every 6 (six) hours as needed. 04/08/23   Tari Fare, CNM  Iron , Ferrous Sulfate , 325 (65 Fe) MG TABS Take 325 mg by mouth 2 (two) times daily. 04/27/23   Zilphia Hilt, Charyl Coppersmith, MD  loperamide  (IMODIUM ) 2 MG capsule TAKE 1 TABLET (2 MG TOTAL) BY MOUTH AS NEEDED FOR DIARRHEA OR LOOSE STOOLS. 02/02/23   Zelma Hidden, FNP  NIFEdipine  (ADALAT  CC) 30 MG 24 hr tablet Take 1 tablet (30 mg total) by mouth at bedtime. 01/13/23   Jolayne Natter, CNM  norethindrone  (CAMILA ) 0.35 MG tablet Take 1 tablet (0.35 mg total) by mouth daily. 03/24/23   Ajewole, Christana, MD  RESTASIS 0.05 % ophthalmic emulsion 1 drop 2 (two) times daily. 03/22/23   [provider]    Family History Family History  Problem Relation Age of Onset   Allergic rhinitis Mother    Hypertension Father     Social History Social History   Tobacco Use   Smoking status: Never    Passive exposure: Never   Smokeless tobacco: Never  Vaping Use   Vaping status: Never Used  Substance Use Topics   Alcohol use: Never   Drug use: Never     Allergies   Pork-derived products   Review of Systems Review of Systems  Constitutional:  Positive for activity change. Negative for appetite change, fatigue and fever.  HENT:  Negative for congestion.   Respiratory:  Negative for cough and shortness of breath.   Cardiovascular:  Negative for chest pain.  Gastrointestinal:  Positive for abdominal pain, diarrhea and nausea. Negative for blood in stool, constipation and vomiting.  Neurological:  Negative for dizziness, light-headedness and headaches.     Physical Exam Triage Vital Signs ED Triage Vitals  Encounter Vitals Group     BP 07/11/23 1629 109/74      Systolic BP Percentile --      Diastolic BP Percentile --      Pulse Rate 07/11/23 1629 95     Resp 07/11/23 1629 16     Temp 07/11/23 1629 98.1 F (36.7 C)     Temp Source 07/11/23 1629 Oral     SpO2 07/11/23 1629 98 %     Weight --      Height --      Head Circumference --      Peak Flow --      Pain Score 07/11/23 1628 7     Pain Loc --      Pain Education --      Exclude from Growth Chart --    No data found.  Updated  Vital Signs BP 109/74 (BP Location: Right Arm)   Pulse 95   Temp 98.1 F (36.7 C) (Oral)   Resp 16   SpO2 98%   Breastfeeding Yes   Visual Acuity Right Eye Distance:   Left Eye Distance:   Bilateral Distance:    Right Eye Near:   Left Eye Near:    Bilateral Near:     Physical Exam Vitals reviewed.  Constitutional:      General: She is awake. She is not in acute distress.    Appearance: Normal appearance. She is well-developed. She is not ill-appearing.     Comments: Very pleasant female presented age in no acute distress sitting comfortably in exam room  HENT:     Head: Normocephalic and atraumatic.     Mouth/Throat:     Mouth: Mucous membranes are moist.     Pharynx: Uvula midline. No oropharyngeal exudate or posterior oropharyngeal erythema.  Cardiovascular:     Rate and Rhythm: Normal rate and regular rhythm.     Heart sounds: Normal heart sounds, S1 normal and S2 normal. No murmur heard. Pulmonary:     Effort: Pulmonary effort is normal.     Breath sounds: Normal breath sounds. No wheezing, rhonchi or rales.     Comments: Clear to auscultation bilaterally Abdominal:     General: Bowel sounds are normal.     Palpations: Abdomen is soft.     Tenderness: There is generalized abdominal tenderness. There is no right CVA tenderness, left CVA tenderness, guarding or rebound. Negative signs include Murphy's sign.     Comments: Mild tenderness palpation throughout abdomen.  No evidence of acute abdomen on physical exam  Psychiatric:         Behavior: Behavior is cooperative.      UC Treatments / Results  Labs (all labs ordered are listed, but only abnormal results are displayed) Labs Reviewed  POCT URINALYSIS DIP (MANUAL ENTRY) - Abnormal; Notable for the following components:      Result Value   Clarity, UA cloudy (*)    Blood, UA trace-intact (*)    Leukocytes, UA Moderate (2+) (*)    All other components within normal limits  URINE CULTURE  POCT URINE PREGNANCY    EKG   Radiology No results found.  Procedures Procedures (including critical care time)  Medications Ordered in UC Medications  ondansetron  (ZOFRAN -ODT) disintegrating tablet 4 mg (4 mg Oral Given 07/11/23 1803)    Initial Impression / Assessment and Plan / UC Course  I have reviewed the triage vital signs and the nursing notes.  Pertinent labs & imaging results that were available during my care of the patient were reviewed by me and considered in my medical decision making (see chart for details).     Patient is well-appearing, afebrile, nontoxic, nontachycardic.  Vital signs and physical exam are reassuring.  Urine pregnancy was negative.  UA did not show any evidence of dehydration but did show moderate leukocytes.  Will send this for culture but defer antibiotics until culture results are available.  Given known sick contacts with similar symptoms concern for viral gastroenteritis.  She was given a dose of Zofran  in clinic with improvement of symptoms.  We discussed that this medication can passed through breastmilk and she should monitor baby for any adverse reactions.  She is to eat a bland diet and drink plenty of fluid.  We discussed that if her symptoms worsen in any way and she has focal abdominal pain, fever, nausea,  vomiting, weakness she needs to be seen immediately.  Strict return precautions given.    Final Clinical Impressions(s) / UC Diagnoses   Final diagnoses:  Viral gastroenteritis  Nausea without vomiting     Discharge  Instructions      Use the Zofran  every 8 hours as needed.  It is unclear if this medication is passed through breastmilk so I do recommend using it as infrequently as possible.  Make sure you drink plenty of fluid and eat a bland diet.  Avoid any spicy/acidic/fatty foods.  If you have any severe abdominal pain, nausea/vomiting despite the medication, fever, blood in your stool, blood in your vomit, weakness you need to go to the emergency room.  ??????? ?????? ?? 8 ????? ??? ??????. ?? ??? ?????? ?? ??? ??? ??? ?????? ????? ??? ???? ????? ??? ???? ????????? ??? ????? ??????? ??? ???????. ????? ??? ??? ????? ????? ?? ??????? ?????? ???? ????? ?????. ????? ??????? ??????/???????/???????. ?? ??? ?????? ???? ???? ?? ?????? ?? ?????/???? ??? ????? ??????? ?? ?????? ?? ???? ???????? ?? ???? ?? ?? ??????? ?? ?????? ?? ?????? ??? ????? ?????? ??? ??? ???????. aistakhdimi zufran kula 8 saeat hasab alhajati. min ghayr alwadih ma 'iidha kan hadha aldawa' yantaqil eabr halib al'umi, lidha 'ansah biaistikhdamih ealaa fatarat mutabaeidat qadr Aurelia Leeks. aihrsi ealaa shurb kamiyaat kabirat min alsawayil watanawal nizam ghidhayiyin muetadilin. tajanubii al'ateimat alharata/alhamidat/aldhnia. fi hal alshueur bi'alam shadid fi albatni, 'aw ghathian/tqyw raghm tanawul aldawa'i, 'aw airtifae fi darajat alhararati, 'aw wujud dam fi albarazi, 'aw alqay', 'aw aldaefi, yajib elyk altawajuh 'iilaa qism altawarii.     ED Prescriptions     Medication Sig Dispense Auth. Provider   ondansetron  (ZOFRAN -ODT) 4 MG disintegrating tablet Take 1 tablet (4 mg total) by mouth every 8 (eight) hours as needed for nausea or vomiting. 9 tablet Christpoher Sievers K, PA-C      PDMP not reviewed this encounter.   Budd Cargo, PA-C 07/11/23 1825

## 2023-07-12 ENCOUNTER — Ambulatory Visit (INDEPENDENT_AMBULATORY_CARE_PROVIDER_SITE_OTHER): Payer: Self-pay

## 2023-07-12 ENCOUNTER — Other Ambulatory Visit: Payer: Self-pay | Admitting: Internal Medicine

## 2023-07-12 DIAGNOSIS — J309 Allergic rhinitis, unspecified: Secondary | ICD-10-CM | POA: Diagnosis not present

## 2023-07-12 DIAGNOSIS — R0789 Other chest pain: Secondary | ICD-10-CM

## 2023-07-12 DIAGNOSIS — M549 Dorsalgia, unspecified: Secondary | ICD-10-CM

## 2023-07-12 LAB — URINE CULTURE: Culture: NO GROWTH

## 2023-07-13 ENCOUNTER — Other Ambulatory Visit: Payer: Self-pay | Admitting: Internal Medicine

## 2023-07-13 DIAGNOSIS — E559 Vitamin D deficiency, unspecified: Secondary | ICD-10-CM

## 2023-07-21 ENCOUNTER — Ambulatory Visit (INDEPENDENT_AMBULATORY_CARE_PROVIDER_SITE_OTHER): Payer: Medicaid Other | Admitting: Allergy & Immunology

## 2023-07-21 VITALS — BP 110/56 | HR 96 | Temp 98.3°F | Resp 14 | Ht 63.78 in | Wt 213.2 lb

## 2023-07-21 DIAGNOSIS — B999 Unspecified infectious disease: Secondary | ICD-10-CM

## 2023-07-21 DIAGNOSIS — J310 Chronic rhinitis: Secondary | ICD-10-CM

## 2023-07-21 DIAGNOSIS — J3089 Other allergic rhinitis: Secondary | ICD-10-CM

## 2023-07-21 DIAGNOSIS — J309 Allergic rhinitis, unspecified: Secondary | ICD-10-CM

## 2023-07-21 MED ORDER — AZITHROMYCIN 250 MG PO TABS
250.0000 mg | ORAL_TABLET | ORAL | 6 refills | Status: DC
Start: 2023-07-22 — End: 2023-08-19

## 2023-07-21 NOTE — Patient Instructions (Addendum)
 1. Chronic rhinitis - Testing at the last visit showed: dust mites - Continue with: Astelin  (azelastine ) 2 sprays per nostril 1-2 times daily as needed - You can use an extra dose of the antihistamine, if needed, for breakthrough symptoms.  - Consider nasal saline rinses 1-2 times daily to remove allergens from the nasal cavities as well as help with mucous clearance (this is especially helpful to do before the nasal sprays are given) - We will continue with the allergy  shots at the same schedule (maintenance reached in April 2025, so we will continue for at least through April 2028).  - I am glad that the Astelin  has been helping with the symptoms.    2. Recurrent infections - You were only protective 6/23 strains of Streptococcus pneumonia (rechecking today).  - This is not a great response and could be a sign of specific antibody deficiency.  - Continue with the azithromycin  250mg  on Mon/Wed/Fri.  - Continue to monitor infections and we can be more aggressive if needed in the future.  - Get your Pneumovax or Prevnar-20 at your PCP's office and we can recheck you values 4-6 weeks afterwards.   3. Return in about 1 year (around 07/20/2024).    Please inform us  of any Emergency Department visits, hospitalizations, or changes in symptoms. Call us  before going to the ED for breathing or allergy  symptoms since we might be able to fit you in for a sick visit. Feel free to contact us  anytime with any questions, problems, or concerns.  It was a pleasure to see you again today!  Websites that have reliable patient information: 1. American Academy of Asthma, Allergy , and Immunology: www.aaaai.org 2. Food Allergy  Research and Education (FARE): foodallergy.org 3. Mothers of Asthmatics: http://www.asthmacommunitynetwork.org 4. American College of Allergy , Asthma, and Immunology: www.acaai.org      "Like" us  on Facebook and Instagram for our latest updates!      A healthy democracy works best  when [employer] participate! Make sure you are registered to vote! If you have moved or changed any of your contact information, you will need to get this updated before voting! Scan the QR codes below to learn more!            Control of Dust Mite Allergen    Dust mites play a major role in allergic asthma and rhinitis.  They occur in environments with high humidity wherever human skin is found.  Dust mites absorb humidity from the atmosphere (ie, they do not drink) and feed on organic matter (including shed human and animal skin).  Dust mites are a microscopic type of insect that you cannot see with the naked eye.  High levels of dust mites have been detected from mattresses, pillows, carpets, upholstered furniture, bed covers, clothes, soft toys and any woven material.  The principal allergen of the dust mite is found in its feces.  A gram of dust may contain 1,000 mites and 250,000 fecal particles.  Mite antigen is easily measured in the air during house cleaning activities.  Dust mites do not bite and do not cause harm to humans, other than by triggering allergies/asthma.    Ways to decrease your exposure to dust mites in your home: This is going [employer] mattresses, box springs and pillows with a mite-impermeable barrier or cover   Wash sheets, blankets and drapes weekly in hot water  (130 F) with detergent and dry them in a dryer on the hot setting.  Have the room cleaned frequently with  a vacuum cleaner and a damp dust-mop.  For carpeting or rugs, vacuuming with a vacuum cleaner equipped with a high-efficiency particulate air (HEPA) filter.  The dust mite allergic individual should not be in a room which is being cleaned and should wait 1 hour after cleaning before going into the room. Do not sleep on upholstered furniture (eg, couches).   If possible removing carpeting, upholstered furniture and drapery from the home is ideal.  Horizontal blinds should be eliminated in the rooms where  the person spends the most time (bedroom, study, television room).  Washable vinyl, roller-type shades are optimal. Remove all non-washable stuffed toys from the bedroom.  Wash stuffed toys weekly like sheets and blankets above.   Reduce indoor humidity to less than 50%.  Inexpensive humidity monitors can be purchased at most hardware stores.  Do not use a humidifier as can make the problem worse and are not recommended.

## 2023-07-21 NOTE — Progress Notes (Signed)
 FOLLOW UP  Date of Service/Encounter:  07/21/23   Assessment:   Perennial allergic rhinitis - dust mites only (on allergy  immunotherapy, started January 2025 and has not reached maintenance)   Recurrent infections - mostly sinopulmonary in nature, but with poor response to Pneumovax (on prophylactic azithromycin , decreasing dose to 250mg  three times weekly   GI upset with azithromycin  - recommended cutting the dose in half  Plan/Recommendations:   1. Chronic rhinitis - Testing at the last visit showed: dust mites - Continue with: Astelin  (azelastine ) 2 sprays per nostril 1-2 times daily as needed - You can use an extra dose of the antihistamine, if needed, for breakthrough symptoms.  - Consider nasal saline rinses 1-2 times daily to remove allergens from the nasal cavities as well as help with mucous clearance (this is especially helpful to do before the nasal sprays are given) - We will continue with the allergy  shots at the same schedule (maintenance reached in April 2025, so we will continue for at least through April 2028).  - I am glad that the Astelin  has been helping with the symptoms.    2. Recurrent infections - You were only protective 6/23 strains of Streptococcus pneumonia (rechecking today).  - This is not a great response and could be a sign of specific antibody deficiency.  - Continue with the azithromycin  250mg  on Mon/Wed/Fri.  - Continue to monitor infections and we can be more aggressive if needed in the future.  - Get your Pneumovax or Prevnar-20 at your PCP's office and we can recheck you values 4-6 weeks afterwards.   3. Return in about 1 year (around 07/20/2024).   Subjective:   Deborah Lawson is a 33 y.o. female presenting today for follow up of  Chief Complaint  Patient presents with   Allergic Rhinitis     She expresses that se is doing well.     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   Discussed the use of AI scribe software for clinical note transcription with the patient and/or guardian, who gave verbal consent to proceed.  Deborah Lawson is a 33 y.o. female presenting for a follow up visit.  She was last seen in February 2025.  At that time, she remained on her allergen immunotherapy as well as Astelin .  For her recurrent infections, we continued her on azithromycin  but decrease the dose to 250 mg 3 times a week.  Since last visit, she has done well.   She has not experienced any recent ear infections, though she occasionally feels discomfort on the right side, which resolves within two days and sometimes requires nasal spray for relief. She has discontinued her antibiotic, previously taken  three times a week, without any issues since stopping. She has not been using her prophylactic antibiotic routinely and she feels no worsening without this on board.   She is currently receiving allergy  shots, which she finds beneficial. She recently replaced the carpet in her apartment with wood flooring, which she believes has also improved her allergy  symptoms. Occasionally, she experiences  bruising at the site of her allergy  shots and prefers one nurse for administration due to perceived skill.  She is interested in understanding if she can stop her antibiotic medication if her lab results show a good response to the vaccine. She is considering repeating the vaccine to further increase her antibody levels. It seems that she has not received her Streptococcal vaccine, although I was under the impression at the last visit that she had done so. She will talk to her PCP about getting it and we can get repeat vaccination titers after that.   No rashes are present.     Otherwise, there have been no changes to her past medical history, surgical history, family history, or social history.    Review of systems otherwise negative other than that mentioned in the HPI.    Objective:   Blood pressure (!) 110/56, pulse 96, temperature 98.3 F (36.8 C), temperature source Temporal, resp. rate 14, height 5' 3.78" (1.62 m), weight 213 lb 3.2 oz (96.7 kg), SpO2 98%, currently breastfeeding. Body mass index is 36.85 kg/m.    Physical Exam Vitals reviewed.  Constitutional:      Appearance: She is well-developed.     Comments: Moving air well in all lung fields.  No increased work of breathing.  HENT:     Head: Normocephalic and atraumatic.     Right Ear: Tympanic membrane, ear canal and external ear normal. No drainage, swelling or tenderness. Tympanic membrane is not injected, scarred, erythematous, retracted or bulging.     Left Ear: Tympanic membrane, ear canal and external ear normal. No drainage, swelling or tenderness. Tympanic membrane is not injected, scarred, erythematous, retracted or bulging.     Nose: No nasal deformity, septal deviation, mucosal edema or rhinorrhea.     Right Turbinates: Enlarged, swollen and pale.     Left Turbinates: Enlarged, swollen and pale.     Right Sinus: No maxillary sinus tenderness or frontal sinus tenderness.     Left Sinus: No maxillary sinus  tenderness or frontal sinus tenderness.     Comments: No polyps.     Mouth/Throat:     Lips: Pink.     Mouth: Mucous membranes are moist. Mucous membranes are not pale and not dry.     Pharynx: Uvula midline.  Eyes:     General:        Right eye: No discharge.        Left eye: No discharge.     Conjunctiva/sclera: Conjunctivae normal.     Right eye: Right conjunctiva is not injected. No chemosis.    Left eye: Left conjunctiva is not injected. No chemosis.    Pupils: Pupils are equal, round, and reactive to light.  Cardiovascular:     Rate and Rhythm: Normal rate and regular rhythm.     Heart sounds: Normal heart sounds.  Pulmonary:     Effort: Pulmonary effort is normal. No tachypnea, accessory muscle usage or respiratory distress.     Breath sounds: Normal breath sounds. No wheezing, rhonchi or rales.     Comments: Moving air well in al lung fields. No increased  work of breathing noted.  Chest:     Chest wall: No tenderness.  Lymphadenopathy:     Head:     Right side of head: No submandibular, tonsillar or occipital adenopathy.     Left side of head: No submandibular, tonsillar or occipital adenopathy.     Cervical: No cervical adenopathy.  Skin:    General: Skin is warm.     Capillary Refill: Capillary refill takes less than 2 seconds.     Coloration: Skin is not pale.     Findings: No abrasion, erythema, petechiae or rash. Rash is not papular, urticarial or vesicular.     Comments: No eczematous or urticarial lesions noted.  Neurological:     Mental Status: She is alert.  Psychiatric:        Behavior: Behavior is cooperative.      Diagnostic studies: none      Drexel Gentles, MD  [facility] 

## 2023-07-26 ENCOUNTER — Ambulatory Visit: Admitting: Internal Medicine

## 2023-07-26 ENCOUNTER — Encounter: Payer: Self-pay | Admitting: Internal Medicine

## 2023-07-26 ENCOUNTER — Encounter: Payer: Self-pay | Admitting: Allergy & Immunology

## 2023-07-26 VITALS — BP 110/60 | HR 110 | Temp 98.1°F | Wt 215.0 lb

## 2023-07-26 DIAGNOSIS — Z23 Encounter for immunization: Secondary | ICD-10-CM | POA: Diagnosis not present

## 2023-07-26 DIAGNOSIS — Z2989 Encounter for other specified prophylactic measures: Secondary | ICD-10-CM | POA: Diagnosis not present

## 2023-07-26 NOTE — Progress Notes (Signed)
 Established Patient Office Visit     CC/Reason for Visit: Needs Prevnar 20  HPI: Deborah Lawson is a 33 y.o. female who is coming in today for the above mentioned reasons.  She was sent here by her allergist to receive the second dose to Prevnar 20.  After her first dose in November she only had protection for 6 of the 23 serotypes and as such a second vaccine was recommended.   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   Vitamin D  deficiency 11/11/2020    Past Surgical History:  Procedure Laterality Date   CESAREAN SECTION N/A 10/26/2019   Procedure: CESAREAN SECTION;  Surgeon: Granville Layer, MD;  [facility]: [facility];  Service: Obstetrics;  Laterality: N/A;   CESAREAN SECTION N/A 01/10/2023   Procedure: CESAREAN SECTION;  Surgeon: Lacey Pian, MD;  Location: [facility];  Service: Obstetrics;  Laterality: N/A;   LAPAROSCOPIC APPENDECTOMY N/A 02/05/2023   Procedure: APPENDECTOMY LAPAROSCOPIC;  Surgeon: Melvenia Stabs, MD;  Location: [facility];  Service: General;  Laterality: N/A;   LAPAROSCOPIC LYSIS OF ADHESIONS N/A 02/05/2023   Procedure: LAPAROSCOPIC LYSIS OF ADHESIONS;  Surgeon: Melvenia Stabs, MD;  Location: [facility];  Service: General;  Laterality: N/A;   MANDIBLE SURGERY      Social History:  reports that she has never smoked. She has never been exposed to tobacco smoke. She has never used smokeless tobacco. She reports that she does not drink alcohol and does not use drugs.  Allergies: Allergies  Allergen Reactions   Pork-Derived Products Other (See Comments)    Religious    Family History:  Family History  Problem Relation Age of Onset   Allergic rhinitis Mother     Hypertension Father      Current Outpatient Medications:    azithromycin  (ZITHROMAX ) 250 MG tablet, Take 1 tablet (250 mg total) by mouth every Monday, Wednesday, and Friday for 90 doses., Disp: 14.571 each, Rfl: 6   cromolyn (OPTICROM) 4 % ophthalmic solution, SMARTSIG:1 Drop(s) In Eye(s) Twice Daily PRN, Disp: , Rfl:    EPINEPHrine  (EPIPEN  2-PAK) 0.3 mg/0.3 mL IJ SOAJ injection, Inject 0.3 mg into the muscle as needed for anaphylaxis., Disp: 0.3 mL, Rfl: 1   fluticasone  (FLONASE ) 50 MCG/ACT nasal spray, Place into both nostrils., Disp: , Rfl:    gabapentin  (NEURONTIN ) 300 MG capsule, Take 300 mg by mouth at bedtime., Disp: , Rfl:    Iron , Ferrous Sulfate , 325 (65 Fe) MG TABS, Take 325 mg by mouth 2 (two) times daily., Disp: , Rfl:    norethindrone  (CAMILA ) 0.35 MG tablet, Take 1 tablet (0.35 mg total) by mouth daily., Disp: 28 tablet, Rfl: 6   RESTASIS 0.05 % ophthalmic emulsion, 1 drop 2 (two) times daily., Disp: , Rfl:   Review of Systems:  Negative unless indicated in HPI.   Physical Exam: Vitals:   07/26/23 0850  BP: 110/60  Pulse: (!) 110  Temp: 98.1 F (36.7 C)  TempSrc: Oral  SpO2: 97%  Weight: 215 lb (97.5 kg)    Body mass index is 37.16 kg/m.   Impression and Plan:  Immunization due   - Prevnar 20 given in office today.  Time spent:22 minutes reviewing chart, interviewing and  examining patient and formulating plan of care.     Marguerita Shih, MD [facility] at [facility]

## 2023-07-26 NOTE — Addendum Note (Signed)
 Addended by: Nicolina Barrios B on: 07/26/2023 09:53 AM   Modules accepted: Orders

## 2023-07-29 ENCOUNTER — Other Ambulatory Visit: Payer: Self-pay | Admitting: Advanced Practice Midwife

## 2023-07-29 DIAGNOSIS — O0990 Supervision of high risk pregnancy, unspecified, unspecified trimester: Secondary | ICD-10-CM

## 2023-08-02 ENCOUNTER — Other Ambulatory Visit: Payer: Self-pay | Admitting: Internal Medicine

## 2023-08-02 DIAGNOSIS — R0789 Other chest pain: Secondary | ICD-10-CM

## 2023-08-02 DIAGNOSIS — M549 Dorsalgia, unspecified: Secondary | ICD-10-CM

## 2023-08-04 ENCOUNTER — Ambulatory Visit (INDEPENDENT_AMBULATORY_CARE_PROVIDER_SITE_OTHER)

## 2023-08-04 DIAGNOSIS — J309 Allergic rhinitis, unspecified: Secondary | ICD-10-CM

## 2023-08-09 ENCOUNTER — Ambulatory Visit (INDEPENDENT_AMBULATORY_CARE_PROVIDER_SITE_OTHER): Payer: Self-pay

## 2023-08-09 DIAGNOSIS — J309 Allergic rhinitis, unspecified: Secondary | ICD-10-CM

## 2023-08-15 NOTE — Progress Notes (Signed)
 NEUROLOGY FOLLOW UP OFFICE NOTE  Deborah Lawson [id number]  Assessment/Plan:   Atypical headache/facial pain, initially left sided but now reports right sided pain as well, unclear etiology- MRI brain/trigeminal nerves/cervical spine and CT sinuses do not reveal etiology.  She does have tenderness in the right TMJ, raising possibility of TMJ dysfunction.  She states that she may grind her teeth in her sleep      Will try to taper off gabapentin  to 2 to 3 times a week.  If still doing well at follow up, consider discontinuing.   Limit use of pain relievers to no more than 9 days out of the month to prevent risk of rebound or medication-overuse headache. Follow up 6 months.   Total time spent in chart and face to face with patient:  40 minutes.    Subjective:  Deborah Lawson is a 33 year old female who follows up for atypical facial pain.  She is accompanied by Arabic speaking interpreter via phone (ID [phone]).  MRI of brain from December personally reviewed.   UPDATE: Currently breastfeeding.  Recently started Camila .  Because her headache became left sided, she had MRI of brain without contrast on 03/12/2023 was unremarkable.    Restarted gabapentin  300mg  at bedtime. She previously took antibiotic and is currently taking injections for allergic rhinitis which she thinks has helped with facial pain as well.   Headache and facial pain improved.  Headaches only occur when tired or sleep deprived.   Sometimes she has blurred vision with white floaters less than a second.  Occurs only when having a headache.    Current NSAIDS/analgesics:  acetaminophen  Current triptans:  none Current ergotamine:  none Current anti-emetic:  none Current muscle relaxants:  none Current Antihypertensive medications:  none Current Antidepressant medications:  none Current Anticonvulsant medications:  gabapentin  300mg  daily Current anti-CGRP:  none Current Vitamins/Herbal/Supplements:   none Current Antihistamines/Decongestants:  none Other therapy:  none Hormone/birth control:  Camila    HISTORY: She started having a headache around January 2024.  No prior history of headache.  It is a moderate to severe left sided pressure/aching pain involving the left temple, eye, ear and face, including along the jaw and behind TMJ.  Sometimes with associated left sided/occipital numbness and tingling.  It comes and goes during the day the pain lessens and resolves by next day.  They occurs when she feels stressed or depressed, about 3 to 4 days a week.  Sometimes associated neck pain.  No associated nausea, vomiting, photophobia, phonophobia, visual disturbance, autonomic symptoms, numbness or weakness.  She went to the ED where she was diagnosed with a cavity.  She had the tooth pulled but pain persisted.  She was seen in the ED on 04/23/2020 where CT head personally reviewed was unremarkable and she was treated with headache cocktail.  She returned to the ED on 05/31/2020 for left sided TMJ pain where she was prescribed a prednisone  taper and instructed to take Motrin  as needed.    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  She was referred to endocrinology. She saw the dentist and had the cavity treated. Noted some improvement.  .  Referred to PT for neck/TMJ pain.  Still with neck pain radiating down left arm.  In March 2023, she was treated for left otitis media.  Still has left ear pain..  Treated for sinusitis and left otitis media.  Still with pain in left ear.  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  CT sinuses on 09/21/2021 revealed resolution of previous left maxillary sinusitis. She saw endocrinology regarding the pituitary microadenoma  with normal workup.  In March 2024, she endorsed right sided stabbing pain involving the face, head and neck.  CT sinuses on 08/23/2022 revealed small volume of frothy secretions in the left sphenoid sinus but otherwise patent sinuses.  MRI of trigeminal nerve without contrast (pregnant) on 09/17/2022 was negative.  Sometimes facial pain comes and goes.  She saw her dentist in December 2024 who took images and told her that her teeth looked fine.  Headaches improved.  Due to ongoing cervical radiculitis, MRI of cervical spine was performed on 07/22/2021, which showed mild cervical spondylosis but no significant spinal canal or foraminal stenosis.  NCV-EMG of left upper extremity on 02/11/2022 was normal.    She has history of chronic low back pain radiating down the left leg.  She has had worsening left leg pain with anterior numbness from the thigh down to the foot.  No trauma.     Past NSAIDS/analgesics:  ibuprofen  Past abortive triptans:  none Past abortive ergotamine:  none Past muscle relaxants:  none Past anti-emetic:  none Past antihypertensive medications:  nifedipine  Past antidepressant medications:  none Past anticonvulsant medications:  gabapentin  600mg  BID (stopped due to pregnancy) Past anti-CGRP:  none Past vitamins/Herbal/Supplements:  none Past antihistamines/decongestants:  none Other past therapies:  none  PAST MEDICAL HISTORY: 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 14.571  each 6   cromolyn (OPTICROM) 4 % ophthalmic solution SMARTSIG:1 Drop(s) In Eye(s) Twice Daily PRN     EPINEPHrine  (EPIPEN  2-PAK) 0.3 mg/0.3 mL IJ SOAJ injection Inject 0.3 mg into the muscle as needed for anaphylaxis. 0.3 mL 1   fluticasone  (FLONASE ) 50 MCG/ACT nasal spray Place into both nostrils.     gabapentin  (NEURONTIN ) 300 MG capsule Take 300 mg by mouth at bedtime.     Iron , Ferrous Sulfate , 325 (65 Fe) MG TABS Take 325 mg by mouth 2 (two) times daily.     norethindrone  (CAMILA ) 0.35 MG tablet Take 1 tablet (0.35 mg total) by mouth daily. 28 tablet 6   RESTASIS 0.05 % ophthalmic emulsion 1 drop 2 (two) times daily.     No current facility-administered medications on file prior to visit.    ALLERGIES: Allergies  Allergen Reactions   Pork-Derived Products Other (See Comments)    Religious    FAMILY HISTORY: Family History  Problem Relation Age of Onset   Allergic rhinitis Mother    Hypertension Father       Objective:  Blood pressure 112/75, pulse 90, height 5\' 5"  (1.651 m), weight 213 lb (96.6 kg), SpO2 99%, not currently breastfeeding. General: No acute distress.  Patient appears well-groomed.      Janne Members, DO  CC: Rene Carrier Acosta,MD

## 2023-08-16 ENCOUNTER — Other Ambulatory Visit

## 2023-08-16 ENCOUNTER — Encounter: Payer: Self-pay | Admitting: Neurology

## 2023-08-16 ENCOUNTER — Encounter: Payer: Self-pay | Admitting: Internal Medicine

## 2023-08-16 ENCOUNTER — Ambulatory Visit (INDEPENDENT_AMBULATORY_CARE_PROVIDER_SITE_OTHER): Admitting: Internal Medicine

## 2023-08-16 ENCOUNTER — Ambulatory Visit (INDEPENDENT_AMBULATORY_CARE_PROVIDER_SITE_OTHER): Payer: Medicaid Other | Admitting: Neurology

## 2023-08-16 VITALS — BP 112/75 | HR 90 | Ht 65.0 in | Wt 213.0 lb

## 2023-08-16 VITALS — BP 110/80 | HR 94 | Temp 98.4°F | Wt 215.4 lb

## 2023-08-16 DIAGNOSIS — R519 Headache, unspecified: Secondary | ICD-10-CM

## 2023-08-16 DIAGNOSIS — N3001 Acute cystitis with hematuria: Secondary | ICD-10-CM

## 2023-08-16 DIAGNOSIS — G501 Atypical facial pain: Secondary | ICD-10-CM

## 2023-08-16 DIAGNOSIS — E559 Vitamin D deficiency, unspecified: Secondary | ICD-10-CM

## 2023-08-16 LAB — POC URINALSYSI DIPSTICK (AUTOMATED)
Bilirubin, UA: NEGATIVE
Glucose, UA: NEGATIVE
Ketones, UA: NEGATIVE
Nitrite, UA: NEGATIVE
Protein, UA: POSITIVE — AB
Spec Grav, UA: 1.02 (ref 1.010–1.025)
Urobilinogen, UA: 1 U/dL
pH, UA: 7 (ref 5.0–8.0)

## 2023-08-16 MED ORDER — GABAPENTIN 300 MG PO CAPS: 300.0000 mg | ORAL_CAPSULE | Freq: Every day | ORAL | 5 refills | Status: DC

## 2023-08-16 MED ORDER — SULFAMETHOXAZOLE-TRIMETHOPRIM 800-160 MG PO TABS: 1.0000 | ORAL_TABLET | Freq: Two times a day (BID) | ORAL | 0 refills | Status: AC

## 2023-08-16 NOTE — Patient Instructions (Signed)
 Decrease gabapentin  to 2 to 3 days a week.  If headaches increase, then increase to daily Follow up in 6 months.

## 2023-08-16 NOTE — Progress Notes (Signed)
 Established Patient Office Visit     CC/Reason for Visit: Dysuria, vitamin D  check  HPI: Deborah Lawson is a 33 y.o. female who is coming in today for the above mentioned reasons.  For the past 4 days she has been describing dysuria and suprapubic pain.  She feels like she has a low-grade temperature.  She is also due for her vitamin D  recheck after completing 12 weeks of high-dose supplementation.   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   Vitamin D  deficiency 11/11/2020    Past Surgical History:  Procedure Laterality Date   CESAREAN SECTION N/A 10/26/2019   Procedure: CESAREAN SECTION;  Surgeon: Granville Layer, MD;  [facility]: [facility];  Service: Obstetrics;  Laterality: N/A;   CESAREAN SECTION N/A 01/10/2023   Procedure: CESAREAN SECTION;  Surgeon: Lacey Pian, MD;  Location: [facility];  Service: Obstetrics;  Laterality: N/A;   LAPAROSCOPIC APPENDECTOMY N/A 02/05/2023   Procedure: APPENDECTOMY LAPAROSCOPIC;  Surgeon: Melvenia Stabs, MD;  Location: [facility];  Service: General;  Laterality: N/A;   LAPAROSCOPIC LYSIS OF ADHESIONS N/A 02/05/2023   Procedure: LAPAROSCOPIC LYSIS OF ADHESIONS;  Surgeon: Melvenia Stabs, MD;  Location: [facility];  Service: General;  Laterality: N/A;   MANDIBLE SURGERY      Social History:  reports that she has never smoked. She has never been exposed to tobacco smoke. She has never used smokeless tobacco. She reports that she does not drink alcohol and does not use drugs.  Allergies: Allergies  Allergen Reactions   Pork-Derived Products Other (See Comments)    Religious    Family History:  Family History  Problem Relation Age of Onset   Allergic  rhinitis Mother    Hypertension Father      Current Outpatient Medications:    azithromycin  (ZITHROMAX ) 250 MG tablet, Take 1 tablet (250 mg total) by mouth every Monday, Wednesday, and Friday for 90 doses., Disp: 14.571 each, Rfl: 6   cromolyn (OPTICROM) 4 % ophthalmic solution, SMARTSIG:1 Drop(s) In Eye(s) Twice Daily PRN, Disp: , Rfl:    EPINEPHrine  (EPIPEN  2-PAK) 0.3 mg/0.3 mL IJ SOAJ injection, Inject 0.3 mg into the muscle as needed for anaphylaxis., Disp: 0.3 mL, Rfl: 1   fluticasone  (FLONASE ) 50 MCG/ACT nasal spray, Place into both nostrils., Disp: , Rfl:    gabapentin  (NEURONTIN ) 300 MG capsule, Take 1 capsule (300 mg total) by mouth at bedtime., Disp: 30 capsule, Rfl: 5   Iron , Ferrous Sulfate , 325 (65 Fe) MG TABS, Take 325 mg by mouth 2 (two) times daily., Disp: , Rfl:    norethindrone  (CAMILA ) 0.35 MG tablet, Take 1 tablet (0.35 mg total) by mouth daily., Disp: 28 tablet, Rfl: 6   RESTASIS 0.05 % ophthalmic emulsion, 1 drop 2 (two) times daily., Disp: , Rfl:    sulfamethoxazole -trimethoprim  (BACTRIM  DS) 800-160 MG tablet, Take 1 tablet by mouth 2 (two) times daily for 7 days., Disp: 14 tablet, Rfl: 0  Review of Systems:  Negative unless indicated in HPI.   Physical Exam: Vitals:   08/16/23 1524  BP: 110/80  Pulse: 94  Temp: 98.4 F (36.9 C)  TempSrc: Oral  SpO2: 97%  Weight: 215 lb 6.4 oz (97.7 kg)    Body  mass index is 35.84 kg/m.   Impression and Plan:  Acute cystitis with hematuria -     POCT Urinalysis Dipstick (Automated) -     Urinalysis; Future -     Urine Culture; Future -     Sulfamethoxazole -Trimethoprim ; Take 1 tablet by mouth 2 (two) times daily for 7 days.  Dispense: 14 tablet; Refill: 0  Vitamin D  deficiency -     VITAMIN D  25 Hydroxy (Vit-D Deficiency, Fractures); Future   - In office urine dipstick positive for blood, leukocytes and protein.  Treated with Bactrim  for 7 days, sent for urine culture. - Check vitamin D  levels.  Time spent:30  minutes reviewing chart, interviewing and examining patient and formulating plan of care.     Marguerita Shih, MD [facility] at [facility]

## 2023-08-17 ENCOUNTER — Ambulatory Visit: Payer: Self-pay | Admitting: Internal Medicine

## 2023-08-17 DIAGNOSIS — E559 Vitamin D deficiency, unspecified: Secondary | ICD-10-CM

## 2023-08-17 LAB — URINALYSIS, ROUTINE W REFLEX MICROSCOPIC
Bilirubin Urine: NEGATIVE
Nitrite: NEGATIVE
Specific Gravity, Urine: 1.015 (ref 1.000–1.030)
Total Protein, Urine: NEGATIVE
Urine Glucose: NEGATIVE
Urobilinogen, UA: 1 (ref 0.0–1.0)
pH: 7 (ref 5.0–8.0)

## 2023-08-17 LAB — URINE CULTURE
MICRO NUMBER:: 16532588
SPECIMEN QUALITY:: ADEQUATE

## 2023-08-17 LAB — VITAMIN D 25 HYDROXY (VIT D DEFICIENCY, FRACTURES): VITD: 25.25 ng/mL — ABNORMAL LOW (ref 30.00–100.00)

## 2023-08-17 MED ORDER — VITAMIN D (ERGOCALCIFEROL) 1.25 MG (50000 UNIT) PO CAPS: 50000.0000 [IU] | ORAL_CAPSULE | ORAL | 0 refills | Status: AC

## 2023-08-18 ENCOUNTER — Ambulatory Visit (INDEPENDENT_AMBULATORY_CARE_PROVIDER_SITE_OTHER): Payer: Self-pay

## 2023-08-18 DIAGNOSIS — J309 Allergic rhinitis, unspecified: Secondary | ICD-10-CM

## 2023-08-19 ENCOUNTER — Ambulatory Visit (HOSPITAL_COMMUNITY): Admission: EM | Admit: 2023-08-19 | Discharge: 2023-08-19 | Disposition: A | Discharge status: Home / Self Care

## 2023-08-19 ENCOUNTER — Encounter (HOSPITAL_COMMUNITY): Payer: Self-pay

## 2023-08-19 DIAGNOSIS — H00014 Hordeolum externum left upper eyelid: Secondary | ICD-10-CM

## 2023-08-19 NOTE — ED Triage Notes (Signed)
 Patient here today with c/o small bump on left eye lid X 2 days.

## 2023-08-19 NOTE — ED Provider Notes (Signed)
 [facility]    CSN: [id number] Arrival date & time: 08/19/23  0810      History   Chief Complaint Chief Complaint  Patient presents with   Eye Problem    HPI Deborah Lawson is a 33 y.o. female.   Patient presents with small bump to her left upper eyelid x 2 days.  Patient states that it is tender to touch.  Denies any visual disturbances.  The history is provided by the patient and medical records.  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   Seasonal and perennial allergic rhinitis 04/28/2023   Gestational hypertension 01/11/2023   Pregnancy 01/10/2023   Cesarean delivery delivered 01/10/2023   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   History of cesarean delivery, currently pregnant 07/07/2022   Supervision of high risk pregnancy, antepartum 06/23/2022   White matter abnormality on MRI of brain 07/06/2021   Vitamin D  deficiency 11/11/2020   Iron  deficiency 11/11/2020   History of gestational diabetes in prior pregnancy, currently pregnant    History of gestational hypertension    Language barrier 10/12/2019    Past Surgical History:  Procedure Laterality Date   CESAREAN SECTION N/A 10/26/2019   Procedure: CESAREAN SECTION;  Surgeon: Granville Layer, MD;  [facility]: [facility];  Service: Obstetrics;  Laterality: N/A;   CESAREAN SECTION N/A 01/10/2023   Procedure: CESAREAN SECTION;  Surgeon: Lacey Pian, MD;  Location: [facility];  Service: Obstetrics;  Laterality: N/A;    LAPAROSCOPIC APPENDECTOMY N/A 02/05/2023   Procedure: APPENDECTOMY LAPAROSCOPIC;  Surgeon: Melvenia Stabs, MD;  Location: [facility];  Service: General;  Laterality: N/A;   LAPAROSCOPIC LYSIS OF ADHESIONS N/A 02/05/2023   Procedure: LAPAROSCOPIC LYSIS OF ADHESIONS;  Surgeon: Melvenia Stabs, MD;  Location: [facility];  Service: General;  Laterality: N/A;   MANDIBLE SURGERY      OB History     Gravida  5   Para  4   Term  4   Preterm  0   AB  1   Living  4      SAB  1   IAB  0   Ectopic  0   Multiple  0   Live Births  4            Home Medications    Prior to Admission medications   Medication Sig Start Date End Date Taking? Authorizing Provider  cromolyn (OPTICROM) 4 % ophthalmic solution SMARTSIG:1 Drop(s) In Eye(s) Twice Daily PRN 03/21/23   [provider]  EPINEPHrine  (EPIPEN  2-PAK) 0.3 mg/0.3 mL IJ SOAJ injection Inject 0.3 mg into the muscle as needed for anaphylaxis. 03/17/23   Rochester Chuck, MD  fluticasone  (FLONASE ) 50 MCG/ACT nasal spray Place into both nostrils. 12/21/22   [provider]  gabapentin  (NEURONTIN ) 300 MG capsule Take 1 capsule (300 mg total) by mouth at bedtime. 08/16/23   Merriam Abbey, DO  norethindrone  (CAMILA ) 0.35 MG tablet Take 1 tablet (0.35 mg total) by mouth daily. 03/24/23  Ajewole, Christana, MD  RESTASIS 0.05 % ophthalmic emulsion 1 drop 2 (two) times daily. 03/22/23   [provider]  sulfamethoxazole -trimethoprim  (BACTRIM  DS) 800-160 MG tablet Take 1 tablet by mouth 2 (two) times daily for 7 days. 08/16/23 08/23/23  Zilphia Hilt, Charyl Coppersmith, MD  Vitamin D , Ergocalciferol , (DRISDOL ) 1.25 MG (50000 UNIT) CAPS capsule Take 1 capsule (50,000 Units total) by mouth every 7 (seven) days for 12 doses. 08/17/23 11/03/23  Raeanne Bull, MD    Family History Family History  Problem Relation Age of Onset   Allergic rhinitis Mother    Hypertension Father     Social History Social History    Tobacco Use   Smoking status: Never    Passive exposure: Never   Smokeless tobacco: Never  Vaping Use   Vaping status: Never Used  Substance Use Topics   Alcohol use: Never   Drug use: Never     Allergies   Pork-derived products   Review of Systems Review of Systems  Per HPI  Physical Exam Triage Vital Signs ED Triage Vitals  Encounter Vitals Group     BP 08/19/23 0853 117/79     Systolic BP Percentile --      Diastolic BP Percentile --      Pulse Rate 08/19/23 0853 91     Resp 08/19/23 0853 16     Temp 08/19/23 0853 98.1 F (36.7 C)     Temp Source 08/19/23 0853 Oral     SpO2 08/19/23 0853 98 %     Weight --      Height --      Head Circumference --      Peak Flow --      Pain Score 08/19/23 0855 8     Pain Loc --      Pain Education --      Exclude from Growth Chart --    No data found.  Updated Vital Signs BP 117/79 (BP Location: Left Arm)   Pulse 91   Temp 98.1 F (36.7 C) (Oral)   Resp 16   LMP  (LMP Unknown)   SpO2 98%   Breastfeeding Yes   Visual Acuity Right Eye Distance:   Left Eye Distance:   Bilateral Distance:    Right Eye Near:   Left Eye Near:    Bilateral Near:     Physical Exam Vitals and nursing note reviewed.  Constitutional:      General: She is awake. She is not in acute distress.    Appearance: Normal appearance. She is well-developed and well-groomed. She is not ill-appearing.  Eyes:     General:        Left eye: Hordeolum present.    Comments: Small hordeolum noted to left upper eyelid  Neurological:     Mental Status: She is alert.  Psychiatric:        Behavior: Behavior is cooperative.      UC Treatments / Results  Labs (all labs ordered are listed, but only abnormal results are displayed) Labs Reviewed - No data to display  EKG   Radiology No results found.  Procedures Procedures (including critical care time)  Medications Ordered in UC Medications - No data to display  Initial Impression /  Assessment and Plan / UC Course  I have reviewed the triage vital signs and the nursing notes.  Pertinent labs & imaging results that were available during my care of the patient were reviewed by me and considered in  my medical decision making (see chart for details).     Patient is well-appearing.  Vitals are stable.  Small large lump noted to left upper eyelid.  Discussed with patient that this does not require any antibiotics at this time.  Recommended applying warm wet washcloths.  Discussed follow-up and return precautions. Final Clinical Impressions(s) / UC Diagnoses   Final diagnoses:  Hordeolum externum of left upper eyelid   Discharge Instructions      Apply warm wet washcloths to the area to help promote drainage and decrease swelling. Follow-up with primary care provider or return here as needed.  ED Prescriptions   None    PDMP not reviewed this encounter.   Levora Reas A, NP 08/19/23 [phone]

## 2023-08-19 NOTE — Discharge Instructions (Signed)
 Apply warm wet washcloths to the area to help promote drainage and decrease swelling. Follow-up with primary care provider or return here as needed.

## 2023-08-22 ENCOUNTER — Emergency Department (HOSPITAL_COMMUNITY)

## 2023-08-22 ENCOUNTER — Encounter (HOSPITAL_COMMUNITY): Payer: Self-pay

## 2023-08-22 ENCOUNTER — Emergency Department (HOSPITAL_COMMUNITY)
Admission: EM | Admit: 2023-08-22 | Discharge: 2023-08-22 | Disposition: A | Discharge status: Home / Self Care | Attending: Emergency Medicine | Admitting: Emergency Medicine

## 2023-08-22 DIAGNOSIS — M25571 Pain in right ankle and joints of right foot: Secondary | ICD-10-CM | POA: Diagnosis present

## 2023-08-22 DIAGNOSIS — W06XXXA Fall from bed, initial encounter: Secondary | ICD-10-CM | POA: Insufficient documentation

## 2023-08-22 MED ORDER — ONDANSETRON 4 MG PO TBDP
4.0000 mg | ORAL_TABLET | Freq: Once | ORAL | Status: AC
  Administered 2023-08-22: 4 mg via ORAL
  Filled 2023-08-22: qty 1

## 2023-08-22 NOTE — Progress Notes (Signed)
 Orthopedic Tech Progress Note Patient Details:  Brilynn Mutasim Sidig Lye Feb 03, 1991 [id number]  Ortho Devices Type of Ortho Device: ASO Ortho Device/Splint Location: rle Ortho Device/Splint Interventions: Ordered, Application, Adjustment   Post Interventions Patient Tolerated: Well Instructions Provided: Care of device, Adjustment of device  Terryann Fiddler 08/22/2023, 5:43 AM

## 2023-08-22 NOTE — ED Triage Notes (Signed)
 Pt BIB [facility] from bed and c/o Right leg pain - on wooden floor.

## 2023-08-22 NOTE — Discharge Instructions (Signed)
 Today you were seen for right ankle pain.  You may rest, ice, elevate, and compress your affected ankle.  You may also alternate taking Tylenol /Motrin  as needed for pain and swelling.  Thank you for letting us  treat you today. After reviewing your imaging, I feel you are safe to go home. Please follow up with your PCP in the next several days and provide them with your records from this visit. Return to the Emergency Room if pain becomes severe or symptoms worsen.

## 2023-08-22 NOTE — ED Provider Notes (Signed)
 [place] EMERGENCY DEPARTMENT AT [facility] Provider Note   CSN: [id number] Arrival date & time: 08/22/23  9562     History  Chief Complaint  Patient presents with   Fall    Deborah Lawson is a 33 y.o. female presents today for a fall from her bed onto wooden floor.  Patient complains of right ankle pain.  Patient denies numbness, tingling, swelling, ecchymosis.  Patient also endorses some nausea and states that she is taking an antibiotic.  Patient also states that she is breast-feeding.  Certified language interpreter used to gather HPI.   Fall       Home Medications Prior to Admission medications   Medication Sig Start Date End Date Taking? Authorizing Provider  cromolyn (OPTICROM) 4 % ophthalmic solution SMARTSIG:1 Drop(s) In Eye(s) Twice Daily PRN 03/21/23   [provider]  EPINEPHrine  (EPIPEN  2-PAK) 0.3 mg/0.3 mL IJ SOAJ injection Inject 0.3 mg into the muscle as needed for anaphylaxis. 03/17/23   Rochester Chuck, MD  fluticasone  (FLONASE ) 50 MCG/ACT nasal spray Place into both nostrils. 12/21/22   [provider]  gabapentin  (NEURONTIN ) 300 MG capsule Take 1 capsule (300 mg total) by mouth at bedtime. 08/16/23   Merriam Abbey, DO  norethindrone  (CAMILA ) 0.35 MG tablet Take 1 tablet (0.35 mg total) by mouth daily. 03/24/23   Ajewole, Christana, MD  RESTASIS 0.05 % ophthalmic emulsion 1 drop 2 (two) times daily. 03/22/23   [provider]  sulfamethoxazole -trimethoprim  (BACTRIM  DS) 800-160 MG tablet Take 1 tablet by mouth 2 (two) times daily for 7 days. 08/16/23 08/23/23  Zilphia Hilt, Charyl Coppersmith, MD  Vitamin D , Ergocalciferol , (DRISDOL ) 1.25 MG (50000 UNIT) CAPS capsule Take 1 capsule (50,000 Units total) by mouth every 7 (seven) days for 12 doses. 08/17/23 11/03/23  Raeanne Bull, MD      Allergies    Pork-derived products    Review of Systems   Review of Systems  Musculoskeletal:  Positive for arthralgias.     Physical Exam Updated Vital Signs BP 132/86 (BP Location: Right Arm)   Pulse (!) 110   Temp 99.8 F (37.7 C) (Oral)   Resp 14   Ht 5\' 5"  (1.651 m)   Wt 97.7 kg   LMP  (LMP Unknown)   SpO2 99%   BMI 35.84 kg/m  Physical Exam Vitals and nursing note reviewed.  Constitutional:      General: She is not in acute distress.    Appearance: Normal appearance. She is well-developed. She is not ill-appearing, toxic-appearing or diaphoretic.  HENT:     Head: Normocephalic and atraumatic.     Right Ear: External ear normal.     Left Ear: External ear normal.     Nose: Nose normal.     Mouth/Throat:     Mouth: Mucous membranes are moist.  Eyes:     Conjunctiva/sclera: Conjunctivae normal.  Cardiovascular:     Rate and Rhythm: Normal rate and regular rhythm.     Pulses: Normal pulses.  Pulmonary:     Effort: Pulmonary effort is normal. No respiratory distress.  Abdominal:     General: There is no distension.     Palpations: Abdomen is soft.  Musculoskeletal:        General: Tenderness present. No swelling or deformity.     Cervical back: Neck supple.     Comments: Patient with tenderness to palpation of the right lateral malleolus.  No tenderness to palpation of the medial  malleolus, proximal right fifth metatarsal, or navicular.  No swelling, erythema, warmth, or ecchymosis noted on exam.  Patient neurovascularly intact.  +2 dorsalis pedis pulses.  Skin:    General: Skin is warm and dry.     Capillary Refill: Capillary refill takes less than 2 seconds.  Neurological:     General: No focal deficit present.     Mental Status: She is alert and oriented to person, place, and time.  Psychiatric:        Mood and Affect: Mood normal.     ED Results / Procedures / Treatments   Labs (all labs ordered are listed, but only abnormal results are displayed) Labs Reviewed - No data to display  EKG None  Radiology DG Ankle Complete Right Result Date: 08/22/2023 CLINICAL DATA:   33 year old female status post fall with right lateral ankle pain. EXAM: RIGHT ANKLE - COMPLETE 3+ VIEW COMPARISON:  None Available. FINDINGS: Bone mineralization is within normal limits. Maintained mortise joint alignment. Talar dome intact. No joint effusion evident on the lateral view. No fracture or dislocation identified. Calcaneus appears intact. No discrete soft tissue injury. IMPRESSION: Negative. Electronically Signed   By: Marlise Simpers M.D.   On: 08/22/2023 04:44    Procedures Procedures    Medications Ordered in ED Medications  ondansetron  (ZOFRAN -ODT) disintegrating tablet 4 mg (4 mg Oral Given 08/22/23 0428)    ED Course/ Medical Decision Making/ A&P                                 Medical Decision Making  This patient presents to the ED for concern of right ankle pain differential diagnosis includes fracture, sprain, strain, musculoskeletal pain   Imaging Studies ordered:  I ordered imaging studies including right ankle x-ray I independently visualized and interpreted imaging which showed negative I agree with the radiologist interpretation   Medicines ordered and prescription drug management:  I ordered medication including Zofran  for nausea    I have reviewed the patients home medicines and have made adjustments as needed   Problem List / ED Course:  Considered for admission or further workup however patient's vital signs, physical exam, and imaging were reassuring.  Patient's symptoms likely due to musculoskeletal pain.  Patient placed in lace up ankle brace and advised to rest, ice, compress, and elevate the affected limb.  Patient may also take Tylenol  Motrin  as needed for pain.  Patient given return precautions.  I feel patient is safe for discharge at this time.          Final Clinical Impression(s) / ED Diagnoses Final diagnoses:  Acute right ankle pain    Rx / DC Orders ED Discharge Orders     None         Carie Charity, PA-C 08/22/23  0447    Iva Mariner, MD 08/22/23 [phone]

## 2023-08-23 ENCOUNTER — Ambulatory Visit (INDEPENDENT_AMBULATORY_CARE_PROVIDER_SITE_OTHER): Payer: Self-pay

## 2023-08-23 DIAGNOSIS — J309 Allergic rhinitis, unspecified: Secondary | ICD-10-CM

## 2023-08-25 ENCOUNTER — Telehealth: Payer: Self-pay | Admitting: Allergy & Immunology

## 2023-08-25 DIAGNOSIS — D806 Antibody deficiency with near-normal immunoglobulins or with hyperimmunoglobulinemia: Secondary | ICD-10-CM

## 2023-08-25 DIAGNOSIS — B999 Unspecified infectious disease: Secondary | ICD-10-CM

## 2023-08-25 NOTE — Telephone Encounter (Signed)
 Patient was here with her husband and requested follow up labs for her Streptococcal titers.   Drexel Gentles, MD [facility] 

## 2023-08-30 ENCOUNTER — Encounter (HOSPITAL_COMMUNITY): Payer: Self-pay

## 2023-08-30 ENCOUNTER — Ambulatory Visit (HOSPITAL_COMMUNITY)
Admission: EM | Admit: 2023-08-30 | Discharge: 2023-08-30 | Disposition: A | Discharge status: Home / Self Care | Attending: Emergency Medicine | Admitting: Emergency Medicine

## 2023-08-30 DIAGNOSIS — R197 Diarrhea, unspecified: Secondary | ICD-10-CM

## 2023-08-30 DIAGNOSIS — A084 Viral intestinal infection, unspecified: Secondary | ICD-10-CM

## 2023-08-30 MED ORDER — ACETAMINOPHEN 325 MG PO TABS: 650.0000 mg | ORAL_TABLET | Freq: Four times a day (QID) | ORAL | 0 refills | Status: AC | PRN

## 2023-08-30 MED ORDER — LOPERAMIDE HCL 2 MG PO CAPS: 2.0000 mg | ORAL_CAPSULE | Freq: Four times a day (QID) | ORAL | 0 refills | Status: DC | PRN

## 2023-08-30 NOTE — ED Provider Notes (Signed)
 [facility]    CSN: [id number] Arrival date & time: 08/30/23  1112      History   Chief Complaint Chief Complaint  Patient presents with   Abdominal Pain    HPI Deborah Lawson is a 33 y.o. female.   Patient presents with intermittent abdominal pain and diarrhea x 3 days.  Patient states that on average she has had approximately 2 episodes of diarrhea each day.  Patient denies nausea, vomiting, severe abdominal pain, fever, bodies, chills, chest pain, and shortness of breath.  Patient reports that her husband and her 2 children have the same symptoms.  Patient denies taking any medication for her symptoms.  Patient is currently breastfeeding.  The history is provided by the patient and medical records.  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/2023   Seasonal and perennial allergic rhinitis 04/28/2023   Gestational hypertension 01/11/2023   Pregnancy 01/10/2023   Cesarean delivery delivered 01/10/2023   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   History of cesarean delivery, currently pregnant 07/07/2022   Supervision of high risk pregnancy, antepartum 06/23/2022   White matter abnormality on MRI of brain 07/06/2021   Vitamin D  deficiency 11/11/2020   Iron  deficiency 11/11/2020   History of gestational diabetes in prior pregnancy, currently pregnant    History of gestational hypertension    Language barrier 10/12/2019    Past Surgical History:  Procedure Laterality Date    CESAREAN SECTION N/A 10/26/2019   Procedure: CESAREAN SECTION;  Surgeon: Granville Layer, MD;  Location: [facility];  Service: Obstetrics;  Laterality: N/A;   CESAREAN SECTION N/A 01/10/2023   Procedure: CESAREAN SECTION;  Surgeon: Lacey Pian, MD;  Location: [facility];  Service: Obstetrics;  Laterality: N/A;   LAPAROSCOPIC APPENDECTOMY N/A 02/05/2023   Procedure: APPENDECTOMY LAPAROSCOPIC;  Surgeon: Melvenia Stabs, MD;  Location: [facility] OR;  Service: General;  Laterality: N/A;   LAPAROSCOPIC LYSIS OF ADHESIONS N/A 02/05/2023   Procedure: LAPAROSCOPIC LYSIS OF ADHESIONS;  Surgeon: Melvenia Stabs, MD;  Location: [facility] OR;  Service: General;  Laterality: N/A;   MANDIBLE SURGERY      OB History     Gravida  5   Para  4   Term  4   Preterm  0   AB  1   Living  4      SAB  1   IAB  0   Ectopic  0   Multiple  0   Live Births  4            Home Medications    Prior to Admission medications   Medication Sig Start Date End Date Taking? Authorizing Provider  acetaminophen  (TYLENOL ) 325 MG tablet Take 2 tablets (650 mg total) by mouth every 6 (six) hours as needed. 08/30/23  Yes Levora Reas A, NP  cromolyn (OPTICROM) 4 % ophthalmic solution SMARTSIG:1 Drop(s) In Eye(s) Twice Daily PRN 03/21/23   [provider]  EPINEPHrine  (EPIPEN  2-PAK) 0.3 mg/0.3 mL IJ SOAJ injection Inject 0.3  mg into the muscle as needed for anaphylaxis. 03/17/23   Rochester Chuck, MD  fluticasone  (FLONASE ) 50 MCG/ACT nasal spray Place into both nostrils. 12/21/22   [provider]  gabapentin  (NEURONTIN ) 300 MG capsule Take 1 capsule (300 mg total) by mouth at bedtime. 08/16/23   Merriam Abbey, DO  loperamide  (IMODIUM ) 2 MG capsule Take 1 capsule (2 mg total) by mouth 4 (four) times daily as needed for diarrhea or loose stools. 08/30/23  Yes Rosevelt Constable, Benno Brensinger A, NP  norethindrone  (CAMILA ) 0.35 MG tablet Take 1 tablet (0.35 mg total) by mouth daily. 03/24/23   Ajewole, Christana, MD   RESTASIS 0.05 % ophthalmic emulsion 1 drop 2 (two) times daily. 03/22/23   [provider]  Vitamin D , Ergocalciferol , (DRISDOL ) 1.25 MG (50000 UNIT) CAPS capsule Take 1 capsule (50,000 Units total) by mouth every 7 (seven) days for 12 doses. 08/17/23 11/03/23  Raeanne Bull, MD    Family History Family History  Problem Relation Age of Onset   Allergic rhinitis Mother    Hypertension Father     Social History Social History   Tobacco Use   Smoking status: Never    Passive exposure: Never   Smokeless tobacco: Never  Vaping Use   Vaping status: Never Used  Substance Use Topics   Alcohol use: Never   Drug use: Never     Allergies   Pork-derived products   Review of Systems Review of Systems  Gastrointestinal:  Positive for abdominal pain.   Per HPI  Physical Exam Triage Vital Signs ED Triage Vitals  Encounter Vitals Group     BP 08/30/23 1136 97/69     Girls Systolic BP Percentile --      Girls Diastolic BP Percentile --      Boys Systolic BP Percentile --      Boys Diastolic BP Percentile --      Pulse Rate 08/30/23 1136 84     Resp 08/30/23 1136 16     Temp 08/30/23 1136 98.2 F (36.8 C)     Temp Source 08/30/23 1136 Oral     SpO2 08/30/23 1136 97 %     Weight --      Height --      Head Circumference --      Peak Flow --      Pain Score 08/30/23 1135 9     Pain Loc --      Pain Education --      Exclude from Growth Chart --    No data found.  Updated Vital Signs BP 97/69 (BP Location: Left Arm)   Pulse 84   Temp 98.2 F (36.8 C) (Oral)   Resp 16   LMP  (LMP Unknown)   SpO2 97%   Breastfeeding Yes   Visual Acuity Right Eye Distance:   Left Eye Distance:   Bilateral Distance:    Right Eye Near:   Left Eye Near:    Bilateral Near:     Physical Exam Vitals and nursing note reviewed.  Constitutional:      General: She is awake. She is not in acute distress.    Appearance: Normal appearance. She is well-developed and  well-groomed. She is not ill-appearing.   Cardiovascular:     Rate and Rhythm: Normal rate and regular rhythm.  Pulmonary:     Effort: Pulmonary effort is normal.     Breath sounds: Normal breath sounds.  Abdominal:     General: Abdomen is  protuberant. Bowel sounds are normal. There is no distension.     Palpations: Abdomen is soft.     Tenderness: There is generalized abdominal tenderness. There is no guarding or rebound.   Skin:    General: Skin is warm and dry.   Neurological:     Mental Status: She is alert.   Psychiatric:        Behavior: Behavior is cooperative.      UC Treatments / Results  Labs (all labs ordered are listed, but only abnormal results are displayed) Labs Reviewed - No data to display  EKG   Radiology No results found.  Procedures Procedures (including critical care time)  Medications Ordered in UC Medications - No data to display  Initial Impression / Assessment and Plan / UC Course  I have reviewed the triage vital signs and the nursing notes.  Pertinent labs & imaging results that were available during my care of the patient were reviewed by me and considered in my medical decision making (see chart for details).     Patient is overall well-appearing.  Vitals are stable.  Upon assessment mild generalized tenderness noted throughout abdomen.  Abdomen is soft and bowel sounds are normal.  No signs of acute abdomen at this time.  Symptoms likely viral in nature considering family members have the same symptoms.  Prescribed Imodium  as needed for diarrhea.  Prescribed Tylenol  as needed for abdominal pain.  Discussed importance of hydration.  Discussed follow-up, return, and strict ER precautions. Final Clinical Impressions(s) / UC Diagnoses   Final diagnoses:  Viral gastroenteritis  Diarrhea, unspecified type     Discharge Instructions      As discussed I believe your symptoms are likely related to a viral stomach illness. You can  take Imodium  4 times daily as needed for diarrhea. You can take 650 mg of Tylenol  every 6 hours as needed for abdominal pain. Make sure you are staying well-hydrated and getting plenty of rest. If you develop worsening abdominal pain, excessive diarrhea, excessive vomiting, fever, weakness, or passing out please seek immediate medical treatment in the emergency department.    ED Prescriptions     Medication Sig Dispense Auth. Provider   loperamide  (IMODIUM ) 2 MG capsule Take 1 capsule (2 mg total) by mouth 4 (four) times daily as needed for diarrhea or loose stools. 12 capsule Levora Reas A, NP   acetaminophen  (TYLENOL ) 325 MG tablet Take 2 tablets (650 mg total) by mouth every 6 (six) hours as needed. 30 tablet Levora Reas A, NP      PDMP not reviewed this encounter.   Levora Reas A, NP 08/30/23 1243

## 2023-08-30 NOTE — Discharge Instructions (Addendum)
 As discussed I believe your symptoms are likely related to a viral stomach illness. You can take Imodium  4 times daily as needed for diarrhea. You can take 650 mg of Tylenol  every 6 hours as needed for abdominal pain. Make sure you are staying well-hydrated and getting plenty of rest. If you develop worsening abdominal pain, excessive diarrhea, excessive vomiting, fever, weakness, or passing out please seek immediate medical treatment in the emergency department.

## 2023-08-30 NOTE — ED Triage Notes (Signed)
 Pt states abdominal pain and diarrhea for the past 3 days.

## 2023-09-01 ENCOUNTER — Ambulatory Visit (INDEPENDENT_AMBULATORY_CARE_PROVIDER_SITE_OTHER): Payer: Self-pay

## 2023-09-01 ENCOUNTER — Encounter: Payer: Self-pay | Admitting: Allergy & Immunology

## 2023-09-01 DIAGNOSIS — J309 Allergic rhinitis, unspecified: Secondary | ICD-10-CM | POA: Diagnosis not present

## 2023-09-01 LAB — STREP PNEUMONIAE 23 SEROTYPES IGG
Pneumo Ab Type 1*: 0.8 ug/mL — ABNORMAL LOW (ref >1.3)
Pneumo Ab Type 12 (12F)*: 0.5 ug/mL — ABNORMAL LOW (ref >1.3)
Pneumo Ab Type 14*: 2.4 ug/mL (ref >1.3)
Pneumo Ab Type 17 (17F)*: <0.1 ug/mL — ABNORMAL LOW (ref >1.3)
Pneumo Ab Type 19 (19F)*: 7.5 ug/mL (ref >1.3)
Pneumo Ab Type 2*: <0.2 ug/mL — ABNORMAL LOW (ref >1.3)
Pneumo Ab Type 20*: 1.6 ug/mL (ref >1.3)
Pneumo Ab Type 22 (22F)*: 0.4 ug/mL — ABNORMAL LOW (ref >1.3)
Pneumo Ab Type 23 (23F)*: >31.2 ug/mL (ref >1.3)
Pneumo Ab Type 26 (6B)*: 0.3 ug/mL — ABNORMAL LOW (ref >1.3)
Pneumo Ab Type 3*: 0.5 ug/mL — ABNORMAL LOW (ref >1.3)
Pneumo Ab Type 34 (10A)*: 0.2 ug/mL — ABNORMAL LOW (ref >1.3)
Pneumo Ab Type 4*: 1.1 ug/mL — ABNORMAL LOW (ref >1.3)
Pneumo Ab Type 43 (11A)*: 3 ug/mL (ref >1.3)
Pneumo Ab Type 5*: 2 ug/mL (ref >1.3)
Pneumo Ab Type 51 (7F)*: 1.1 ug/mL — ABNORMAL LOW (ref >1.3)
Pneumo Ab Type 54 (15B)*: 9.3 ug/mL (ref >1.3)
Pneumo Ab Type 56 (18C)*: 0.4 ug/mL — ABNORMAL LOW (ref >1.3)
Pneumo Ab Type 57 (19A)*: 5.1 ug/mL (ref >1.3)
Pneumo Ab Type 68 (9V)*: 0.9 ug/mL — ABNORMAL LOW (ref >1.3)
Pneumo Ab Type 70 (33F)*: 3.7 ug/mL (ref >1.3)
Pneumo Ab Type 8*: 6.3 ug/mL (ref >1.3)
Pneumo Ab Type 9 (9N)*: 0.1 ug/mL — ABNORMAL LOW (ref >1.3)

## 2023-09-01 NOTE — Telephone Encounter (Signed)
 Forwarding patient myChart message to have lab results that just resulted today read by provider.

## 2023-09-05 DIAGNOSIS — J3089 Other allergic rhinitis: Secondary | ICD-10-CM | POA: Diagnosis not present

## 2023-09-05 NOTE — Progress Notes (Signed)
 VIAL MADE 09-05-23

## 2023-09-07 ENCOUNTER — Ambulatory Visit: Payer: Self-pay | Admitting: Allergy & Immunology

## 2023-09-15 ENCOUNTER — Ambulatory Visit (INDEPENDENT_AMBULATORY_CARE_PROVIDER_SITE_OTHER)

## 2023-09-15 DIAGNOSIS — J309 Allergic rhinitis, unspecified: Secondary | ICD-10-CM

## 2023-09-20 ENCOUNTER — Ambulatory Visit: Payer: Medicaid Other | Admitting: Allergy & Immunology

## 2023-09-29 ENCOUNTER — Ambulatory Visit (INDEPENDENT_AMBULATORY_CARE_PROVIDER_SITE_OTHER)

## 2023-09-29 DIAGNOSIS — J309 Allergic rhinitis, unspecified: Secondary | ICD-10-CM

## 2023-10-13 ENCOUNTER — Ambulatory Visit (INDEPENDENT_AMBULATORY_CARE_PROVIDER_SITE_OTHER)

## 2023-10-13 DIAGNOSIS — J309 Allergic rhinitis, unspecified: Secondary | ICD-10-CM | POA: Diagnosis not present

## 2023-10-17 ENCOUNTER — Ambulatory Visit: Payer: Self-pay

## 2023-10-17 NOTE — Telephone Encounter (Signed)
 Noted

## 2023-10-17 NOTE — Telephone Encounter (Signed)
 FYI Only or Action Required?: FYI only for provider.  Patient was last seen in primary care on 08/16/2023 by Theophilus Andrews, Tully GRADE, MD.  Called Nurse Triage reporting Generalized Body Aches.  Symptoms began 3 days ago.  Interventions attempted: Nothing.  Symptoms are: unchanged.  Triage Disposition: See PCP When Office is Open (Within 3 Days)  Patient/caregiver understands and will follow disposition?: Yes             Copied from CRM #[id number]. Topic: Clinical - Red Word Triage >> Oct 17, 2023  8:00 AM Robinson H wrote: [facility] that prompted transfer to Nurse Triage: Santina to urgent care for bad cough, took prednisone  and cough medicine still has cough sore throat. Reason for Disposition  [1] MODERATE pain (e.g., interferes with normal activities) AND [2] present > 3 days  Answer Assessment - Initial Assessment Questions 1. ONSET: When did the muscle aches or body pains start?      X 3 days 2. LOCATION: What part of your body is hurting? (e.g., entire body, arms, legs)      Generalized 3. SEVERITY: How bad is the pain? (Scale 1-10; or mild, moderate, severe)     9/10 4. CAUSE: What do you think is causing the pains?     Unknown 5. FEVER: Do you have a fever? If Yes, ask: What is your temperature, how was it measured, and  when did it start?      None 6. OTHER SYMPTOMS: Do you have any other symptoms? (e.g., chest pain, cold or flu symptoms, rash, weakness, weight loss)     HA 7. PREGNANCY: Is there any chance you are pregnant? When was your last menstrual period?     None 8. TRAVEL: Have you traveled out of the country in the last month? (e.g., exposures, travel history)     None  Protocols used: Muscle Aches and Body Pain-A-AH

## 2023-10-18 ENCOUNTER — Ambulatory Visit: Admitting: Internal Medicine

## 2023-10-18 ENCOUNTER — Encounter: Payer: Self-pay | Admitting: Internal Medicine

## 2023-10-18 VITALS — BP 110/84 | HR 56 | Temp 98.3°F | Wt 213.8 lb

## 2023-10-18 DIAGNOSIS — R5383 Other fatigue: Secondary | ICD-10-CM | POA: Diagnosis not present

## 2023-10-18 MED ORDER — MELOXICAM 7.5 MG PO TABS: 7.5000 mg | ORAL_TABLET | Freq: Every day | ORAL | 0 refills | Status: DC

## 2023-10-18 NOTE — Progress Notes (Signed)
 Established Patient Office Visit     CC/Reason for Visit: Chronic fatigue  HPI: Deborah Lawson is a 33 y.o. female who is coming in today for the above mentioned reasons.  Her fatigue has worsened since having her last baby.  She is still breast-feeding throughout the night.  Has not been sleeping well at all.  She is not taking her vitamin D  or iron  supplements.   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   Vitamin D  deficiency 11/11/2020    Past Surgical History:  Procedure Laterality Date   CESAREAN SECTION N/A 10/26/2019   Procedure: CESAREAN SECTION;  Surgeon: Fredirick Glenys RAMAN, MD;  [facility]: [facility];  Service: Obstetrics;  Laterality: N/A;   CESAREAN SECTION N/A 01/10/2023   Procedure: CESAREAN SECTION;  Surgeon: Cleatus Moccasin, MD;  Location: [facility];  Service: Obstetrics;  Laterality: N/A;   LAPAROSCOPIC APPENDECTOMY N/A 02/05/2023   Procedure: APPENDECTOMY LAPAROSCOPIC;  Surgeon: Teresa Lonni HERO, MD;  Location: [facility];  Service: General;  Laterality: N/A;   LAPAROSCOPIC LYSIS OF ADHESIONS N/A 02/05/2023   Procedure: LAPAROSCOPIC LYSIS OF ADHESIONS;  Surgeon: Teresa Lonni HERO, MD;  Location: [facility];  Service: General;  Laterality: N/A;   MANDIBLE SURGERY      Social History:  reports that she has never smoked. She has never been exposed to tobacco smoke. She has never used smokeless tobacco. She reports that she does not drink alcohol and does not use drugs.  Allergies: Allergies  Allergen Reactions   Pork-Derived Products Other (See Comments)    Religious    Family History:  Family History  Problem Relation Age of Onset   Allergic rhinitis Mother    Hypertension Father       Current Outpatient Medications:    acetaminophen  (TYLENOL ) 325 MG tablet, Take 2 tablets (650 mg total) by mouth every 6 (six) hours as needed., Disp: 30 tablet, Rfl: 0   cromolyn (OPTICROM) 4 % ophthalmic solution, SMARTSIG:1 Drop(s) In Eye(s) Twice Daily PRN, Disp: , Rfl:    EPINEPHrine  (EPIPEN  2-PAK) 0.3 mg/0.3 mL IJ SOAJ injection, Inject 0.3 mg into the muscle as needed for anaphylaxis., Disp: 0.3 mL, Rfl: 1   fluticasone  (FLONASE ) 50 MCG/ACT nasal spray, Place into both nostrils., Disp: , Rfl:    gabapentin  (NEURONTIN ) 300 MG capsule, Take 1 capsule (300 mg total) by mouth at bedtime., Disp: 30 capsule, Rfl: 5   loperamide  (IMODIUM ) 2 MG capsule, Take 1 capsule (2 mg total) by mouth 4 (four) times daily as needed for diarrhea or loose stools., Disp: 12 capsule, Rfl: 0   meloxicam  (MOBIC ) 7.5 MG tablet, Take 1 tablet (7.5 mg total) by mouth daily., Disp: 30 tablet, Rfl: 0   norethindrone  (CAMILA ) 0.35 MG tablet, Take 1 tablet (0.35 mg total) by mouth daily., Disp: 28 tablet, Rfl: 6   RESTASIS 0.05 % ophthalmic emulsion, 1 drop 2 (two) times daily., Disp: , Rfl:    Vitamin D , Ergocalciferol , (DRISDOL ) 1.25 MG (50000 UNIT) CAPS capsule, Take 1 capsule (50,000 Units total) by mouth every 7 (seven) days for 12 doses., Disp: 12 capsule, Rfl: 0  Review of Systems:  Negative unless indicated in HPI.   Physical Exam: Vitals:   10/18/23 9143  BP: 110/84  Pulse: (!) 56  Temp: 98.3 F (36.8 C)  TempSrc: Oral  SpO2: 99%  Weight: 213 lb 12.8 oz (97 kg)    Body mass index is 35.58 kg/m.   Impression and Plan:  Fatigue, unspecified type -     Meloxicam ; Take 1 tablet (7.5 mg total) by mouth daily.  Dispense: 30 tablet; Refill: 0   - Suspect this is mainly due to lack of sleep giving newborn at home, have advised importance of taking prenatal vitamins while breast-feeding as well as supplementing iron  and vitamin D  given her known iron  deficiency and vitamin D  deficiency.  She will  return in 3 months to recheck vitamin D  and iron  stores.  Time spent:30 minutes reviewing chart, interviewing and examining patient and formulating plan of care.     Tully Theophilus Andrews, MD [facility]

## 2023-10-20 ENCOUNTER — Ambulatory Visit (INDEPENDENT_AMBULATORY_CARE_PROVIDER_SITE_OTHER)

## 2023-10-20 DIAGNOSIS — J309 Allergic rhinitis, unspecified: Secondary | ICD-10-CM

## 2023-10-23 ENCOUNTER — Other Ambulatory Visit: Payer: Self-pay | Admitting: Obstetrics and Gynecology

## 2023-10-23 DIAGNOSIS — Z3041 Encounter for surveillance of contraceptive pills: Secondary | ICD-10-CM

## 2023-10-30 ENCOUNTER — Encounter (HOSPITAL_COMMUNITY): Payer: Self-pay

## 2023-10-30 ENCOUNTER — Ambulatory Visit (HOSPITAL_COMMUNITY)
Admission: RE | Admit: 2023-10-30 | Discharge: 2023-10-30 | Disposition: A | Discharge status: Home / Self Care | Source: Ambulatory Visit | Attending: Physician Assistant | Admitting: Physician Assistant

## 2023-10-30 VITALS — BP 138/77 | HR 100 | Temp 98.4°F | Resp 16

## 2023-10-30 DIAGNOSIS — N760 Acute vaginitis: Secondary | ICD-10-CM | POA: Diagnosis not present

## 2023-10-30 LAB — POCT URINALYSIS DIP (MANUAL ENTRY)
Glucose, UA: NEGATIVE mg/dL
Leukocytes, UA: NEGATIVE
Nitrite, UA: NEGATIVE
Spec Grav, UA: >=1.03 — AB (ref 1.010–1.025)
Urobilinogen, UA: 0.2 U/dL
pH, UA: 5.5 (ref 5.0–8.0)

## 2023-10-30 LAB — POCT URINE PREGNANCY: Preg Test, Ur: NEGATIVE

## 2023-10-30 MED ORDER — METRONIDAZOLE 500 MG PO TABS: 500.0000 mg | ORAL_TABLET | Freq: Two times a day (BID) | ORAL | 0 refills | Status: DC

## 2023-10-30 NOTE — ED Triage Notes (Signed)
 Patient here today with c/o burning in urination, vaginal itching, low back pain, and lower abd pain X 2 days.

## 2023-10-30 NOTE — Discharge Instructions (Addendum)
 Return if any problems.

## 2023-10-30 NOTE — ED Provider Notes (Signed)
 [facility]    CSN: [id number] Arrival date & time: 10/30/23  1317      History   Chief Complaint Chief Complaint  Patient presents with   Vaginal Itching    Back pain, belly side pain. - Entered by patient    HPI Deborah Lawson is a 33 y.o. female.   Pt complains of a vaginal discharge.  Pt reports discomfort with urinating.  Pt denies fever or chills.  Pt denies fever or chills.  No std risk.  Pt denies nausea vomiting or diarrhea.    The history is provided by the patient. No language interpreter was used.  Vaginal Itching This is a new problem. The problem occurs constantly. The problem has not changed since onset.Nothing aggravates the symptoms. Nothing relieves the symptoms. She has tried nothing for the symptoms.    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) 04/28/2023   Seasonal and perennial allergic rhinitis 04/28/2023   Gestational hypertension 01/11/2023   Pregnancy 01/10/2023   Cesarean delivery delivered 01/10/2023   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   History of cesarean delivery, currently pregnant 07/07/2022   Supervision of high risk pregnancy, antepartum 06/23/2022   White matter abnormality on MRI of brain 07/06/2021   Vitamin D  deficiency 11/11/2020   Iron  deficiency 11/11/2020   History of gestational diabetes in prior pregnancy, currently pregnant    History of gestational hypertension    Language barrier 10/12/2019    Past Surgical History:  Procedure Laterality Date    CESAREAN SECTION N/A 10/26/2019   Procedure: CESAREAN SECTION;  Surgeon: Fredirick Glenys RAMAN, MD;  [facility];  Service: Obstetrics;  Laterality: N/A;   CESAREAN SECTION N/A 01/10/2023   Procedure: CESAREAN SECTION;  Surgeon: Cleatus Moccasin, MD;  [facility];  Service: Obstetrics;  Laterality: N/A;   LAPAROSCOPIC APPENDECTOMY N/A 02/05/2023   Procedure: APPENDECTOMY LAPAROSCOPIC;  Surgeon: Teresa Lonni HERO, MD;  [facility]: [facility];  Service: General;  Laterality: N/A;   LAPAROSCOPIC LYSIS OF ADHESIONS N/A 02/05/2023   Procedure: LAPAROSCOPIC LYSIS OF ADHESIONS;  Surgeon: Teresa Lonni HERO, MD;  [facility];  Service: General;  Laterality: N/A;   MANDIBLE SURGERY      OB History     Gravida  5   Para  4   Term  4   Preterm  0   AB  1   Living  4      SAB  1   IAB  0   Ectopic  0   Multiple  0   Live Births  4            Home Medications    Prior to Admission medications   Medication Sig Start Date End Date Taking? Authorizing Provider  acetaminophen  (TYLENOL ) 325 MG tablet Take 2 tablets (650 mg total) by mouth every 6 (six) hours as needed. 08/30/23   Johnie Flaming A, NP  EPINEPHrine  (EPIPEN  2-PAK) 0.3 mg/0.3 mL IJ SOAJ injection Inject 0.3 mg into the muscle as needed for anaphylaxis. 03/17/23  Iva Marty Saltness, MD  fluticasone  (FLONASE ) 50 MCG/ACT nasal spray Place into both nostrils. 12/21/22   [provider]  gabapentin  (NEURONTIN ) 300 MG capsule Take 1 capsule (300 mg total) by mouth at bedtime. 08/16/23   Skeet Juliene SAUNDERS, DO  meloxicam  (MOBIC ) 7.5 MG tablet Take 1 tablet (7.5 mg total) by mouth daily. 10/18/23   Theophilus Andrews, Tully GRADE, MD  norethindrone  (MICRONOR ) 0.35 MG tablet TAKE 1 TABLET BY MOUTH EVERY DAY 10/24/23   Ajewole, Christana, MD  RESTASIS 0.05 % ophthalmic emulsion 1 drop 2 (two) times daily. 03/22/23   [provider]  Vitamin D , Ergocalciferol , (DRISDOL ) 1.25 MG (50000 UNIT) CAPS capsule Take 1 capsule  (50,000 Units total) by mouth every 7 (seven) days for 12 doses. 08/17/23 11/03/23  Theophilus Andrews Tully GRADE, MD    Family History Family History  Problem Relation Age of Onset   Allergic rhinitis Mother    Hypertension Father     Social History Social History   Tobacco Use   Smoking status: Never    Passive exposure: Never   Smokeless tobacco: Never  Vaping Use   Vaping status: Never Used  Substance Use Topics   Alcohol use: Never   Drug use: Never     Allergies   Pork-derived products   Review of Systems Review of Systems  All other systems reviewed and are negative.    Physical Exam Triage Vital Signs ED Triage Vitals  Encounter Vitals Group     BP 10/30/23 1343 138/77     Girls Systolic BP Percentile --      Girls Diastolic BP Percentile --      Boys Systolic BP Percentile --      Boys Diastolic BP Percentile --      Pulse Rate 10/30/23 1343 100     Resp 10/30/23 1343 16     Temp 10/30/23 1343 98.4 F (36.9 C)     Temp Source 10/30/23 1343 Oral     SpO2 10/30/23 1343 97 %     Weight --      Height --      Head Circumference --      Peak Flow --      Pain Score 10/30/23 1345 8     Pain Loc --      Pain Education --      Exclude from Growth Chart --    No data found.  Updated Vital Signs BP 138/77 (BP Location: Left Arm)   Pulse 100   Temp 98.4 F (36.9 C) (Oral)   Resp 16   LMP 02/12/2023 (Approximate)   SpO2 97%   Breastfeeding Yes   Visual Acuity Right Eye Distance:   Left Eye Distance:   Bilateral Distance:    Right Eye Near:   Left Eye Near:    Bilateral Near:     Physical Exam Vitals and nursing note reviewed.  Constitutional:      Appearance: She is well-developed.  HENT:     Head: Normocephalic.  Cardiovascular:     Rate and Rhythm: Normal rate.  Pulmonary:     Effort: Pulmonary effort is normal.  Abdominal:     General: There is no distension.  Musculoskeletal:        General: Normal range of motion.  Skin:     General: Skin is warm.  Neurological:     General: No focal deficit present.     Mental Status: She is alert and oriented to person, place,  and time.      UC Treatments / Results  Labs (all labs ordered are listed, but only abnormal results are displayed) Labs Reviewed  POCT URINALYSIS DIP (MANUAL ENTRY) - Abnormal; Notable for the following components:      Result Value   Clarity, UA turbid (*)    Bilirubin, UA small (*)    Ketones, POC UA trace (5) (*)    Spec Grav, UA >=1.030 (*)    Blood, UA trace-intact (*)    Protein Ur, POC trace (*)    All other components within normal limits  POCT URINE PREGNANCY  GC/CHLAMYDIA PROBE AMP ([place]) NOT AT [facility]    EKG   Radiology No results found.  Procedures Procedures (including critical care time)  Medications Ordered in UC Medications - No data to display  Initial Impression / Assessment and Plan / UC Course  I have reviewed the triage vital signs and the nursing notes.  Pertinent labs & imaging results that were available during my care of the patient were reviewed by me and considered in my medical decision making (see chart for details).      Final Clinical Impressions(s) / UC Diagnoses   Final diagnoses:  Vaginitis and vulvovaginitis     Discharge Instructions      Return if any problems.      ED Prescriptions     Medication Sig Dispense Auth. Provider   metroNIDAZOLE  (FLAGYL ) 500 MG tablet Take 1 tablet (500 mg total) by mouth 2 (two) times daily. 14 tablet Zaelynn Fuchs K, PA-C      PDMP not reviewed this encounter. An After Visit Summary was printed and given to the patient.    Flint Sonny POUR, [place] 10/30/23 1446

## 2023-10-31 ENCOUNTER — Telehealth: Payer: Self-pay | Admitting: Family Medicine

## 2023-10-31 ENCOUNTER — Other Ambulatory Visit: Payer: Self-pay | Admitting: Internal Medicine

## 2023-10-31 ENCOUNTER — Other Ambulatory Visit: Payer: Self-pay | Admitting: Obstetrics and Gynecology

## 2023-10-31 DIAGNOSIS — R5383 Other fatigue: Secondary | ICD-10-CM

## 2023-10-31 LAB — GC/CHLAMYDIA PROBE AMP (~~LOC~~) NOT AT ARMC
Chlamydia: NEGATIVE
Comment: NEGATIVE
Comment: NORMAL
Neisseria Gonorrhea: NEGATIVE

## 2023-10-31 NOTE — Telephone Encounter (Signed)
 Patient called because she has a question about the medication Flagyl  that she was prescribed yesterday at the urgent care

## 2023-10-31 NOTE — Telephone Encounter (Signed)
 Patient also left a message on nurse voicemail stating she is calling about a birth control medicine and has a question. I called patient with [employer] Interpreter # [phone] and left a message we are returning her call and she may call our office back.  Rock Skip PEAK

## 2023-11-01 ENCOUNTER — Ambulatory Visit (INDEPENDENT_AMBULATORY_CARE_PROVIDER_SITE_OTHER)

## 2023-11-01 DIAGNOSIS — J309 Allergic rhinitis, unspecified: Secondary | ICD-10-CM

## 2023-11-13 ENCOUNTER — Other Ambulatory Visit: Payer: Self-pay | Admitting: Internal Medicine

## 2023-11-13 DIAGNOSIS — E559 Vitamin D deficiency, unspecified: Secondary | ICD-10-CM

## 2023-11-24 ENCOUNTER — Ambulatory Visit (INDEPENDENT_AMBULATORY_CARE_PROVIDER_SITE_OTHER)

## 2023-11-24 DIAGNOSIS — J309 Allergic rhinitis, unspecified: Secondary | ICD-10-CM

## 2023-11-30 ENCOUNTER — Other Ambulatory Visit: Payer: Self-pay | Admitting: Internal Medicine

## 2023-11-30 ENCOUNTER — Other Ambulatory Visit: Payer: Self-pay | Admitting: Obstetrics and Gynecology

## 2023-11-30 DIAGNOSIS — R5383 Other fatigue: Secondary | ICD-10-CM

## 2023-12-14 ENCOUNTER — Ambulatory Visit: Payer: Medicaid Other | Admitting: Internal Medicine

## 2023-12-14 ENCOUNTER — Encounter: Payer: Self-pay | Admitting: Internal Medicine

## 2023-12-14 VITALS — BP 110/70 | HR 91 | Ht 65.0 in | Wt 205.0 lb

## 2023-12-14 DIAGNOSIS — E237 Disorder of pituitary gland, unspecified: Secondary | ICD-10-CM

## 2023-12-14 MED ORDER — DEXAMETHASONE 1 MG PO TABS: 1.0000 mg | ORAL_TABLET | Freq: Once | ORAL | 0 refills | Status: AC

## 2023-12-14 NOTE — Progress Notes (Unsigned)
 Name: Deborah Lawson  MRN/ DOB: [id number], 01-15-1991    Age/ Sex: 33 y.o., female    PCP: Theophilus Andrews, Tully GRADE, MD   Reason for Endocrinology Evaluation: Pituitary cyst     Date of Initial Endocrinology Evaluation: 08/05/2020    HPI: Deborah Lawson is a 33 y.o. female with unremarkable past medical history . The patient presented for initial endocrinology clinic visit on 08/05/2020 for consultative assistance with her pituitary cyst.   During evaluation of left sided facial pain through neurology she was found to have a 3 mm pituitary cyst during MRI in 06/2020 Pituitary work-up has been normal 07/2020   Pt noted atypical left facial pain and migraine headaches since having the nexplanon . Historically she has had occasional headaches but these became worse associated with left visual changes .  Her headaches have improved since removing the Nexplanon 5/11th 2022  She is S/P C-section in 10/2019 ,this was her 3rd pregnancy.   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  Patient receives immunotherapy for allergic rhinitis  She follows with neurology for atypical headache/facial pain, they are tapering off gabapentin  She was evaluated by her PCP in August for fatigue, but the patient continues to breast-feed throughout the night and has not been sleeping well and was not taking her vitamins  She did have appendectomy in November, 2024 She is s/p C-section 01/10/2023  Headaches are improving  No visual changes  She continues with fatigue  No palpitations  No constipation or diarrhea  No LE edema  She is on Microner     HISTORY:  Past Medical History:  Past Medical History:  Diagnosis Date   Abnormal computed tomography of paranasal sinus 07/06/2021   Chronic left-sided low back pain 08/04/2022   Gestational diabetes    Gestational hypertension     Referred otalgia, left 08/06/2020   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   Vitamin D  deficiency 11/11/2020   Past Surgical History:  Past Surgical History:  Procedure Laterality Date   CESAREAN SECTION N/A 10/26/2019   Procedure: CESAREAN SECTION;  Surgeon: Fredirick Glenys RAMAN, MD;  [facility];  Service: Obstetrics;  Laterality: N/A;   CESAREAN SECTION N/A 01/10/2023   Procedure: CESAREAN SECTION;  Surgeon: Cleatus Moccasin, MD;  [facility];  Service: Obstetrics;  Laterality: N/A;   LAPAROSCOPIC APPENDECTOMY N/A 02/05/2023   Procedure: APPENDECTOMY LAPAROSCOPIC;  Surgeon: Teresa Lonni HERO, MD;  [facility];  Service: General;  Laterality: N/A;   LAPAROSCOPIC LYSIS OF ADHESIONS N/A 02/05/2023   Procedure: LAPAROSCOPIC LYSIS OF ADHESIONS;  Surgeon: Teresa Lonni HERO, MD;  [facility];  Service: General;  Laterality: N/A;   MANDIBLE SURGERY      Social History:  reports that she has never smoked. She has never been exposed to tobacco smoke. She has never used smokeless tobacco. She reports that she does not drink alcohol and does not use drugs. Family History: family history includes Allergic rhinitis in her mother; Hypertension in her father.   HOME MEDICATIONS: Allergies as of 12/14/2023       Reactions   Pork-derived Products Other (See Comments)   Religious        Medication List        Accurate as of December 14, 2023  8:20 AM. If you have any questions, ask your nurse or doctor.  acetaminophen  325 MG tablet Commonly known as: Tylenol  Take 2 tablets (650 mg total) by mouth every 6 (six) hours as needed.   EPINEPHrine  0.3 mg/0.3 mL Soaj injection Commonly known as: EpiPen  2-Pak Inject 0.3 mg into the muscle as needed for anaphylaxis.   fluticasone  50 MCG/ACT nasal spray Commonly known as: FLONASE  Place into both nostrils.   gabapentin  300 MG capsule Commonly known as: NEURONTIN  Take 1 capsule (300 mg total) by mouth at  bedtime.   loperamide  2 MG capsule Commonly known as: IMODIUM  TAKE 1 CAPSULE (2 MG TOTAL) BY MOUTH AS NEEDED FOR DIARRHEA OR LOOSE STOOLS.   meloxicam  7.5 MG tablet Commonly known as: MOBIC  TAKE 1 TABLET BY MOUTH EVERY DAY   metroNIDAZOLE  500 MG tablet Commonly known as: FLAGYL  Take 1 tablet (500 mg total) by mouth 2 (two) times daily.   norethindrone  0.35 MG tablet Commonly known as: MICRONOR  TAKE 1 TABLET BY MOUTH EVERY DAY   Restasis 0.05 % ophthalmic emulsion Generic drug: cycloSPORINE 1 drop 2 (two) times daily.          REVIEW OF SYSTEMS: A comprehensive ROS was conducted with the patient and is negative except as per HPI    OBJECTIVE:  VS: There were no vitals taken for this visit.   Wt Readings from Last 3 Encounters:  10/18/23 213 lb 12.8 oz (97 kg)  08/22/23 215 lb 6.4 oz (97.7 kg)  08/16/23 215 lb 6.4 oz (97.7 kg)     EXAM: General: Pt appears well and is in NAD  Neck:  Thyroid : Thyroid  size normal.  No goiter or nodules appreciated.  Lungs: Clear with good BS bilat   Heart: Auscultation: RRR.  Abdomen:  soft, nontender  Extremities:  BL LE: No pretibial edema   Mental Status: Judgment, insight: Intact Orientation: Oriented to time, place, and person Mood and affect: No depression, anxiety, or agitation     DATA REVIEWED:  ***     MRI Brain 07/03/2021  Dedicated pituitary protocol imaging was performed, including dynamic post-contrast imaging. 2-3 mm T2 hyperintense and transiently hypoenhancing focus within the left inferior aspect of the pituitary gland (for instance as seen on series 11, image 6) (series 17, image 5)(series 20, image 6). This is compatible with the provided history of pituitary microadenoma. There is no cavernous sinus invasion. No suprasellar extension. The pituitary stalk is midline and is not abnormally thickened.   Cerebral volume is normal.   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  IMPRESSION: 2-3 mm T2 hyperintense and transiently hypoenhancing lesion within the left inferior aspect of the pituitary gland, as described and compatible with the provided history of pituitary microdenoma.   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   Otherwise unremarkable MRI appearance of the brain.   ASSESSMENT/PLAN/RECOMMENDATIONS:   Pituitary microadenoma :  - This was an incidental finding on MRI for evaluation of headaches.  - Repeat pituitary  imaging was stable 06/2021 - No clinical concern for hypo or hyperpituitarism  -Historically biochemical testing has been normal, limited biochemical testing today as the patient is in third trimester of pregnancy -BMP and TFTs are normal   F/Y in 1 year  Signed electronically by: Stefano Redgie Butts, MD  [facility] Endocrinology  [facility] [address] [place] [place], [place] [address] Phone: [phone] FAX: [phone]   CC: Theophilus Andrews, Tully GRADE, MD [address] [place] [address] Phone: [phone] Fax: [phone]  Return to Endocrinology clinic as below: Future Appointments  Date Time Provider Department Center  12/14/2023  8:30 AM Kiely Cousar, Donell Cardinal, MD [facility] None  01/18/2024  9:30 AM Theophilus Andrews, Tully GRADE, MD [facility] [address]  03/12/2024  2:10 PM Skeet Juliene SAUNDERS, DO [facility] None  07/24/2024 10:30 AM Iva Marty Saltness, MD [facility] None

## 2023-12-14 NOTE — Patient Instructions (Signed)
   Instructions for Dexamethasone Suppression Test   Step 1: Choose a morning when you can come to our lab at 8:00 am for a blood draw.   Step 2: On the night before the blood draw, take one 1 mg tablet of dexamethasone at 11:30 pm.  The timing is VERY important!   Step 3: The next morning, go to the lab for blood work at 8:00 am.  Bonita Quin do not have to be on an empty stomach, but the timing is VERY important!

## 2023-12-16 ENCOUNTER — Other Ambulatory Visit: Payer: Self-pay

## 2023-12-16 ENCOUNTER — Ambulatory Visit: Payer: Self-pay | Admitting: Internal Medicine

## 2023-12-16 DIAGNOSIS — E237 Disorder of pituitary gland, unspecified: Secondary | ICD-10-CM

## 2023-12-18 LAB — BASIC METABOLIC PANEL WITH GFR
BUN: 11 mg/dL (ref 7–25)
CO2: 29 mmol/L (ref 20–32)
Calcium: 9.7 mg/dL (ref 8.6–10.2)
Chloride: 104 mmol/L (ref 98–110)
Creat: 0.54 mg/dL (ref 0.50–0.97)
Glucose, Bld: 95 mg/dL (ref 65–99)
Potassium: 4.7 mmol/L (ref 3.5–5.3)
Sodium: 140 mmol/L (ref 135–146)
eGFR: 126 mL/min/1.73m2 (ref >=60)

## 2023-12-18 LAB — T4, FREE: Free T4: 1 ng/dL (ref 0.8–1.8)

## 2023-12-18 LAB — CORTISOL: Cortisol, Plasma: 9 ug/dL

## 2023-12-18 LAB — INSULIN-LIKE GROWTH FACTOR
IGF-I, LC/MS: 215 ng/mL (ref 53–331)
Z-Score (Female): 0.8 {STDV} (ref ?–2.0)

## 2023-12-18 LAB — TSH: TSH: 1.83 m[IU]/L

## 2023-12-18 LAB — ACTH: C206 ACTH: 23 pg/mL (ref 6–50)

## 2023-12-19 ENCOUNTER — Other Ambulatory Visit

## 2023-12-23 ENCOUNTER — Other Ambulatory Visit

## 2023-12-28 ENCOUNTER — Ambulatory Visit: Payer: Self-pay | Admitting: Internal Medicine

## 2023-12-28 LAB — ACTH: C206 ACTH: 8 pg/mL (ref 6–50)

## 2024-01-06 ENCOUNTER — Ambulatory Visit

## 2024-01-06 DIAGNOSIS — J309 Allergic rhinitis, unspecified: Secondary | ICD-10-CM

## 2024-01-10 ENCOUNTER — Telehealth: Payer: Self-pay | Admitting: Family Medicine

## 2024-01-10 DIAGNOSIS — Z3041 Encounter for surveillance of contraceptive pills: Secondary | ICD-10-CM

## 2024-01-10 NOTE — Telephone Encounter (Signed)
 Good afternoon,   Deborah Lawson called us  today to schedule a visit for birth control. She mentioned that she is currently on the pill and would like to know if she will be put on the same pill (birth control) as she is currently taking. Patient is expecting a call to further discuss.   Thanks! Elex

## 2024-01-11 MED ORDER — NORETHINDRONE 0.35 MG PO TABS: 1.0000 | ORAL_TABLET | Freq: Every day | ORAL | 1 refills | Status: DC

## 2024-01-11 NOTE — Telephone Encounter (Signed)
 RN returned pt call using [employer] [phone].  Pt is requesting to know if her birth control pills can be refilled.  Pt has annual visit scheduled for 03/05/24.  Advised pt that refill will be provided to last until her annual visit.  Pt verbalized understanding.   [place], RN

## 2024-01-18 ENCOUNTER — Ambulatory Visit (INDEPENDENT_AMBULATORY_CARE_PROVIDER_SITE_OTHER): Admitting: Internal Medicine

## 2024-01-18 ENCOUNTER — Encounter: Payer: Self-pay | Admitting: Internal Medicine

## 2024-01-18 VITALS — BP 120/84 | HR 95 | Temp 98.2°F | Wt 201.9 lb

## 2024-01-18 DIAGNOSIS — Z23 Encounter for immunization: Secondary | ICD-10-CM | POA: Diagnosis not present

## 2024-01-18 DIAGNOSIS — R5383 Other fatigue: Secondary | ICD-10-CM | POA: Diagnosis not present

## 2024-01-18 DIAGNOSIS — E559 Vitamin D deficiency, unspecified: Secondary | ICD-10-CM

## 2024-01-18 DIAGNOSIS — D509 Iron deficiency anemia, unspecified: Secondary | ICD-10-CM

## 2024-01-18 LAB — CBC WITH DIFFERENTIAL/PLATELET
Basophils Absolute: 0 K/uL (ref 0.0–0.1)
Basophils Relative: 0.4 % (ref 0.0–3.0)
Eosinophils Absolute: 0.1 K/uL (ref 0.0–0.7)
Eosinophils Relative: 1.4 % (ref 0.0–5.0)
HCT: 38.8 % (ref 36.0–46.0)
Hemoglobin: 13.1 g/dL (ref 12.0–15.0)
Lymphocytes Relative: 42.1 % (ref 12.0–46.0)
Lymphs Abs: 3.6 K/uL (ref 0.7–4.0)
MCHC: 33.7 g/dL (ref 30.0–36.0)
MCV: 79.3 fl (ref 78.0–100.0)
Monocytes Absolute: 0.6 K/uL (ref 0.1–1.0)
Monocytes Relative: 7.2 % (ref 3.0–12.0)
Neutro Abs: 4.2 K/uL (ref 1.4–7.7)
Neutrophils Relative %: 48.9 % (ref 43.0–77.0)
Platelets: 429 K/uL — ABNORMAL HIGH (ref 150.0–400.0)
RBC: 4.89 Mil/uL (ref 3.87–5.11)
RDW: 15.6 % — ABNORMAL HIGH (ref 11.5–15.5)
WBC: 8.5 K/uL (ref 4.0–10.5)

## 2024-01-18 MED ORDER — MELOXICAM 7.5 MG PO TABS: 7.5000 mg | ORAL_TABLET | Freq: Every day | ORAL | 0 refills | Status: DC

## 2024-01-18 NOTE — Progress Notes (Signed)
 Established Patient Office Visit     CC/Reason for Visit: Vitamin D  check, flu vaccine  HPI: Deborah Lawson is a 33 y.o. female who is coming in today for the above mentioned reasons.  She is here today to have her vitamin D  level checked after completing 3 months of high-dose supplementation.  Also requesting a flu vaccine.   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   Vitamin D  deficiency 11/11/2020    Past Surgical History:  Procedure Laterality Date   CESAREAN SECTION N/A 10/26/2019   Procedure: CESAREAN SECTION;  Surgeon: Fredirick Glenys RAMAN, MD;  [facility];  Service: Obstetrics;  Laterality: N/A;   CESAREAN SECTION N/A 01/10/2023   Procedure: CESAREAN SECTION;  Surgeon: Cleatus Moccasin, MD;  [facility];  Service: Obstetrics;  Laterality: N/A;   LAPAROSCOPIC APPENDECTOMY N/A 02/05/2023   Procedure: APPENDECTOMY LAPAROSCOPIC;  Surgeon: Teresa Lonni HERO, MD;  [facility];  Service: General;  Laterality: N/A;   LAPAROSCOPIC LYSIS OF ADHESIONS N/A 02/05/2023   Procedure: LAPAROSCOPIC LYSIS OF ADHESIONS;  Surgeon: Teresa Lonni HERO, MD;  [facility];  Service: General;  Laterality: N/A;   MANDIBLE SURGERY      Social History:  reports that she has never smoked. She has never been exposed to tobacco smoke. She has never used smokeless tobacco. She reports that she does not drink alcohol and does not use drugs.  Allergies: Allergies  Allergen Reactions   Porcine (Pork) Protein-Containing Drug Products Other (See Comments)    Religious    Family History:  Family History  Problem Relation Age of Onset   Allergic rhinitis Mother    Hypertension Father       Current Outpatient Medications:    EPINEPHrine  (EPIPEN  2-PAK) 0.3 mg/0.3 mL IJ SOAJ injection, Inject 0.3 mg into the muscle as needed for anaphylaxis., Disp: 0.3 mL, Rfl: 1   fluticasone  (FLONASE ) 50 MCG/ACT nasal spray, Place into both nostrils., Disp: , Rfl:    gabapentin  (NEURONTIN ) 300 MG capsule, Take 1 capsule (300 mg total) by mouth at bedtime., Disp: 30 capsule, Rfl: 5   norethindrone  (MICRONOR ) 0.35 MG tablet, Take 1 tablet (0.35 mg total) by mouth daily., Disp: 84 tablet, Rfl: 1   RESTASIS 0.05 % ophthalmic emulsion, 1 drop 2 (two) times daily., Disp: , Rfl:    acetaminophen  (TYLENOL ) 325 MG tablet, Take 2 tablets (650 mg total) by mouth every 6 (six) hours as needed., Disp: 30 tablet, Rfl: 0   meloxicam  (MOBIC ) 7.5 MG tablet, Take 1 tablet (7.5 mg total) by mouth daily., Disp: 30 tablet, Rfl: 0  Review of Systems:  Negative unless indicated in HPI.   Physical Exam: Vitals:   01/18/24 0904  BP: 120/84  Pulse: 95  Temp: 98.2 F (36.8 C)  TempSrc: Oral  SpO2: 99%  Weight: 201 lb 14.4 oz (91.6 kg)    Body mass index is 33.6 kg/m.   Physical Exam Vitals reviewed.  Constitutional:      Appearance: Normal appearance.  HENT:     Head: Normocephalic and atraumatic.  Eyes:     Conjunctiva/sclera: Conjunctivae normal.  Cardiovascular:     Rate and Rhythm: Normal rate and regular rhythm.  Pulmonary:  Effort: Pulmonary effort is normal.     Breath sounds: Normal breath sounds.  Skin:    General: Skin is warm and dry.  Neurological:     General: No focal deficit present.     Mental Status: She is alert and oriented to person, place, and time.  Psychiatric:        Mood and Affect: Mood normal.        Behavior: Behavior normal.        Thought Content: Thought content normal.        Judgment: Judgment normal.      Impression and Plan:  Vitamin D  deficiency -     VITAMIN D  25 Hydroxy (Vit-D Deficiency, Fractures); Future  Fatigue, unspecified type -      Meloxicam ; Take 1 tablet (7.5 mg total) by mouth daily.  Dispense: 30 tablet; Refill: 0  Immunization due   - Flu vaccine administered in office today. - Check vitamin D  levels.  Time spent:22 minutes reviewing chart, interviewing and examining patient and formulating plan of care.     Tully Theophilus Andrews, MD  [facility]

## 2024-01-18 NOTE — Addendum Note (Signed)
 Addended by: KATHRYNE MILLMAN B on: 01/18/2024 04:33 PM   Modules accepted: Orders

## 2024-01-19 ENCOUNTER — Ambulatory Visit: Payer: Self-pay | Admitting: Internal Medicine

## 2024-01-19 DIAGNOSIS — E559 Vitamin D deficiency, unspecified: Secondary | ICD-10-CM

## 2024-01-19 LAB — IBC + FERRITIN
Ferritin: 39.6 ng/mL (ref 10.0–291.0)
Iron: 48 ug/dL (ref 42–145)
Saturation Ratios: 13 % — ABNORMAL LOW (ref 20.0–50.0)
TIBC: 369.6 ug/dL (ref 250.0–450.0)
Transferrin: 264 mg/dL (ref 212.0–360.0)

## 2024-01-19 LAB — VITAMIN D 25 HYDROXY (VIT D DEFICIENCY, FRACTURES): VITD: 20.56 ng/mL — ABNORMAL LOW (ref 30.00–100.00)

## 2024-01-19 MED ORDER — VITAMIN D (ERGOCALCIFEROL) 1.25 MG (50000 UNIT) PO CAPS: 50000.0000 [IU] | ORAL_CAPSULE | ORAL | 0 refills | Status: AC

## 2024-02-07 ENCOUNTER — Ambulatory Visit (INDEPENDENT_AMBULATORY_CARE_PROVIDER_SITE_OTHER)

## 2024-02-07 DIAGNOSIS — J309 Allergic rhinitis, unspecified: Secondary | ICD-10-CM | POA: Diagnosis not present

## 2024-02-23 ENCOUNTER — Other Ambulatory Visit: Payer: Self-pay

## 2024-02-23 ENCOUNTER — Emergency Department (HOSPITAL_BASED_OUTPATIENT_CLINIC_OR_DEPARTMENT_OTHER)
Admission: EM | Admit: 2024-02-23 | Discharge: 2024-02-23 | Disposition: A | Discharge status: Home / Self Care | Attending: Emergency Medicine | Admitting: Emergency Medicine

## 2024-02-23 ENCOUNTER — Other Ambulatory Visit (HOSPITAL_BASED_OUTPATIENT_CLINIC_OR_DEPARTMENT_OTHER): Payer: Self-pay

## 2024-02-23 ENCOUNTER — Ambulatory Visit (HOSPITAL_COMMUNITY): Payer: Self-pay

## 2024-02-23 ENCOUNTER — Encounter (HOSPITAL_BASED_OUTPATIENT_CLINIC_OR_DEPARTMENT_OTHER): Payer: Self-pay

## 2024-02-23 DIAGNOSIS — R509 Fever, unspecified: Secondary | ICD-10-CM | POA: Diagnosis present

## 2024-02-23 DIAGNOSIS — J101 Influenza due to other identified influenza virus with other respiratory manifestations: Secondary | ICD-10-CM | POA: Diagnosis not present

## 2024-02-23 LAB — RESP PANEL BY RT-PCR (RSV, FLU A&B, COVID)  RVPGX2
Influenza A by PCR: POSITIVE — AB
Influenza B by PCR: NEGATIVE
Resp Syncytial Virus by PCR: NEGATIVE
SARS Coronavirus 2 by RT PCR: NEGATIVE

## 2024-02-23 MED ORDER — IBUPROFEN 600 MG PO TABS
600.0000 mg | ORAL_TABLET | Freq: Four times a day (QID) | ORAL | 0 refills | Status: DC | PRN
  Filled 2024-02-23: qty 30, 8d supply, fill #0

## 2024-02-23 MED ORDER — OSELTAMIVIR PHOSPHATE 75 MG PO CAPS
75.0000 mg | ORAL_CAPSULE | Freq: Two times a day (BID) | ORAL | 0 refills | Status: DC
  Filled 2024-02-23: qty 10, 5d supply, fill #0

## 2024-02-23 MED ORDER — IBUPROFEN 400 MG PO TABS
600.0000 mg | ORAL_TABLET | Freq: Once | ORAL | Status: AC
  Administered 2024-02-23: 600 mg via ORAL
  Filled 2024-02-23 (×2): qty 1

## 2024-02-23 NOTE — Discharge Instructions (Addendum)
Alternate tylenol and ibuprofen for fever. °

## 2024-02-23 NOTE — ED Triage Notes (Signed)
 Presents to ED with family for c/o fever and body aches for two days. Son at home has flu. Eating and drinking as normal.

## 2024-02-23 NOTE — ED Provider Notes (Signed)
 Deborah Lawson   CSN: [id number] Arrival date & time: 02/23/24  1016     Patient presents with: Fever   Deborah Lawson is a 33 y.o. female.   Pt is a 33 yo female with no significant pmhx.  She is here with her son who also has sx.  She developed fever and uri sx with body aches yesterday.  She has a different son at home who has the flu.  She has not taken anything for sx.       Prior to Admission medications  Medication Sig Start Date End Date Taking? Authorizing Provider  ibuprofen  (ADVIL ) 600 MG tablet Take 1 tablet (600 mg total) by mouth every 6 (six) hours as needed. 02/23/24  Yes Dean Clarity, MD  oseltamivir (TAMIFLU) 75 MG capsule Take 1 capsule (75 mg total) by mouth every 12 (twelve) hours. 02/23/24  Yes Dean Clarity, MD  acetaminophen  (TYLENOL ) 325 MG tablet Take 2 tablets (650 mg total) by mouth every 6 (six) hours as needed. 08/30/23   Johnie Flaming A, NP  EPINEPHrine  (EPIPEN  2-PAK) 0.3 mg/0.3 mL IJ SOAJ injection Inject 0.3 mg into the muscle as needed for anaphylaxis. 03/17/23   Iva Marty Saltness, MD  fluticasone  (FLONASE ) 50 MCG/ACT nasal spray Place into both nostrils. 12/21/22   [provider]  gabapentin  (NEURONTIN ) 300 MG capsule Take 1 capsule (300 mg total) by mouth at bedtime. 08/16/23   Skeet Juliene SAUNDERS, DO  meloxicam  (MOBIC ) 7.5 MG tablet Take 1 tablet (7.5 mg total) by mouth daily. 01/18/24   Theophilus Andrews, Tully GRADE, MD  norethindrone  (MICRONOR ) 0.35 MG tablet Take 1 tablet (0.35 mg total) by mouth daily. 01/11/24   Eldonna Suzen Octave, MD  RESTASIS 0.05 % ophthalmic emulsion 1 drop 2 (two) times daily. 03/22/23   [provider]  Vitamin D , Ergocalciferol , (DRISDOL ) 1.25 MG (50000 UNIT) CAPS capsule Take 1 capsule (50,000 Units total) by mouth every 7 (seven) days for 12 doses. 01/19/24 04/06/24  Theophilus Andrews, Tully GRADE, MD    Allergies: Porcine (pork)  protein-containing drug products    Review of Systems  Constitutional:  Positive for chills and fever.  HENT:  Positive for rhinorrhea.   Musculoskeletal:  Positive for arthralgias and myalgias.  All other systems reviewed and are negative.   Updated Vital Signs BP (!) 135/94   Pulse (!) 111   Temp 98.3 F (36.8 C) (Oral)   Resp 18   Wt 92 kg   SpO2 100%   BMI 33.75 kg/m   Physical Exam Vitals and nursing Lawson reviewed.  Constitutional:      Appearance: Normal appearance. She is obese.  HENT:     Head: Normocephalic and atraumatic.     Right Ear: External ear normal.     Left Ear: External ear normal.     Nose: Rhinorrhea present.     Mouth/Throat:     Mouth: Mucous membranes are moist.     Pharynx: Oropharynx is clear.  Cardiovascular:     Rate and Rhythm: Normal rate and regular rhythm.     Pulses: Normal pulses.     Heart sounds: Normal heart sounds.  Pulmonary:     Effort: Pulmonary effort is normal.     Breath sounds: Normal breath sounds.  Abdominal:     General: Abdomen is flat. Bowel sounds are normal.     Palpations: Abdomen is soft.  Musculoskeletal:  General: Normal range of motion.     Cervical back: Normal range of motion and neck supple.  Skin:    General: Skin is warm.     Capillary Refill: Capillary refill takes less than 2 seconds.  Neurological:     General: No focal deficit present.     Mental Status: She is alert and oriented to person, place, and time.  Psychiatric:        Mood and Affect: Mood normal.        Behavior: Behavior normal.     (all labs ordered are listed, but only abnormal results are displayed) Labs Reviewed  RESP PANEL BY RT-PCR (RSV, FLU A&B, COVID)  RVPGX2 - Abnormal; Notable for the following components:      Result Value   Influenza A by PCR POSITIVE (*)    All other components within normal limits    EKG: None  Radiology: No results found.   Procedures   Medications Ordered in the ED  ibuprofen   (ADVIL ) tablet 600 mg (600 mg Oral Given 02/23/24 1101)                                    Medical Decision Making Risk Prescription drug management.   This patient presents to the ED for concern of uri sx, fever, chills, this involves an extensive number of treatment options, and is a complaint that carries with it a high risk of complications and morbidity.  The differential diagnosis includes covid/flu/rsv   Co morbidities that complicate the patient evaluation  none   Additional history obtained:  Additional history obtained from epic chart review    Lab Tests:  I Ordered, and personally interpreted labs.  The pertinent results include:  influenza A +; covid/rsv neg   Medicines ordered and prescription drug management:  I ordered medication including ibuprofen   for sx  Reevaluation of the patient after these medicines showed that the patient improved I have reviewed the patients home medicines and have made adjustments as needed  Problem List / ED Course:  Influenza A:  pt's sx have improved.  She is stable for d/c.  Return if worse. F/u with pcp.   Reevaluation:  After the interventions noted above, I reevaluated the patient and found that they have :improved   Social Determinants of Health:  Lives at home   Dispostion:  After consideration of the diagnostic results and the patients response to treatment, I feel that the patent would benefit from discharge with outpatient f/u.       Final diagnoses:  Influenza A    ED Discharge Orders          Ordered    oseltamivir (TAMIFLU) 75 MG capsule  Every 12 hours        02/23/24 1127    ibuprofen  (ADVIL ) 600 MG tablet  Every 6 hours PRN        02/23/24 1127               Dean Clarity, MD 02/23/24 1140

## 2024-02-24 ENCOUNTER — Ambulatory Visit: Admitting: Physician Assistant

## 2024-03-01 ENCOUNTER — Ambulatory Visit (HOSPITAL_COMMUNITY)
Admission: EM | Admit: 2024-03-01 | Discharge: 2024-03-01 | Disposition: A | Discharge status: Home / Self Care | Source: Home / Self Care

## 2024-03-01 ENCOUNTER — Encounter (HOSPITAL_COMMUNITY): Payer: Self-pay | Admitting: Emergency Medicine

## 2024-03-01 DIAGNOSIS — H66001 Acute suppurative otitis media without spontaneous rupture of ear drum, right ear: Secondary | ICD-10-CM

## 2024-03-01 MED ORDER — AMOXICILLIN 500 MG PO CAPS: 500.0000 mg | ORAL_CAPSULE | Freq: Three times a day (TID) | ORAL | 0 refills | Status: DC

## 2024-03-01 NOTE — ED Triage Notes (Signed)
 Patient presents today for complaints of (R) ear pain in addition to nasal congestion, runny nose. Patient reports being diagnosed with flu last week, denies any fever. Patient denies any PO meds, but did use nasal spray.

## 2024-03-01 NOTE — ED Provider Notes (Signed)
 PCP: Theophilus Andrews, Tully GRADE, MD Chief Complaint: Otalgia    Subjective:   HPI: Patient is a 33 y.o. female here for right sided ear pain and facial pressure since yesterday.  Patient was diagnosed with the flu on 02/23/2024 and had been doing well until yesterday when she had increased right ear pain, right sided facial pressure.  She takes Flonase  for allergies at a baseline and has only taken this for treatment.  She is unaware of any fevers.  She has not taken any Tylenol  or ibuprofen .  She denies any other symptoms at this time including cough, mucus production.  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 HEENT: Pupils equal, round, and reactive to light.  Conjunctiva non-injected.  Nares patent without discharge.  Oral mucosa is moist and pink.  Posterior pharynx clear without erythema.  Right tympanic membrane is bulging, erythematous with purulent fluid present, sinus pressure tenderness present as well on the right side CV: Regular rate and rhythm without murmurs, gallops, or rubs. Lungs: Normal respiratory effort, speaks in clear sentences MSK: Joints and muscles are symmetrical with no swelling, redness, or deformity. Ext: No cyanosis, clubbing, or edema.  Assessment/Plan:   Deborah Lawson is a 33 y.o. female who was seen today for the following: 1. Non-recurrent acute suppurative otitis media of right ear without spontaneous rupture of  tympanic membrane (Primary) - Patient is exam significant for purulent otitis media on the right - Will send in amoxicillin  500 mg 3 times daily for 7 days - Likely related to her recent viral illness - She can follow-up with her PCP as needed - Handout given about otitis media and when to seek further care - Welcome to return at anytime - Arabic interpreter used for the entire duration of her visit  Follow-up/Education:   May return sooner as needed and encouraged to call/e-mail for additional questions or  worsening symptoms in the interim.  Krystal Lowing, DO Sports Medicine Fellow 03/01/2024 8:56 AM  Disclaimer: This transcription was electronically signed. It was transcribed by [place] and may contain errors in the text that were not recognized on proofreading.      [1]  No current facility-administered medications on file prior to encounter.   Current Outpatient Medications on File Prior to Encounter  Medication Sig Dispense Refill   acetaminophen  (TYLENOL ) 325 MG tablet Take 2 tablets (650 mg total) by mouth every 6 (six) hours as needed. 30 tablet 0   EPINEPHrine  (EPIPEN  2-PAK) 0.3 mg/0.3 mL IJ SOAJ injection Inject 0.3 mg into the muscle as needed for anaphylaxis. 0.3 mL 1   fluticasone  (FLONASE ) 50 MCG/ACT nasal spray Place into both nostrils.     gabapentin  (NEURONTIN ) 300 MG capsule Take 1 capsule (300 mg total) by mouth at bedtime. 30 capsule 5   norethindrone  (MICRONOR ) 0.35 MG tablet Take 1 tablet (0.35 mg total) by mouth daily. 84 tablet 1  RESTASIS 0.05 % ophthalmic emulsion 1 drop 2 (two) times daily.     Vitamin D , Ergocalciferol , (DRISDOL ) 1.25 MG (50000 UNIT) CAPS capsule Take 1 capsule (50,000 Units total) by mouth every 7 (seven) days for 12 doses. 12 capsule 0     Jilda Krystal HERO, [place] 03/01/24 9143

## 2024-03-02 ENCOUNTER — Ambulatory Visit (INDEPENDENT_AMBULATORY_CARE_PROVIDER_SITE_OTHER)

## 2024-03-02 DIAGNOSIS — J309 Allergic rhinitis, unspecified: Secondary | ICD-10-CM

## 2024-03-05 ENCOUNTER — Other Ambulatory Visit: Payer: Self-pay

## 2024-03-05 ENCOUNTER — Encounter: Payer: Self-pay | Admitting: Obstetrics and Gynecology

## 2024-03-05 ENCOUNTER — Ambulatory Visit: Admitting: Obstetrics and Gynecology

## 2024-03-05 VITALS — BP 112/81 | HR 90 | Wt 199.0 lb

## 2024-03-05 DIAGNOSIS — R8290 Unspecified abnormal findings in urine: Secondary | ICD-10-CM | POA: Diagnosis not present

## 2024-03-05 DIAGNOSIS — Z1239 Encounter for other screening for malignant neoplasm of breast: Secondary | ICD-10-CM

## 2024-03-05 DIAGNOSIS — Z01419 Encounter for gynecological examination (general) (routine) without abnormal findings: Secondary | ICD-10-CM

## 2024-03-05 DIAGNOSIS — Z3009 Encounter for other general counseling and advice on contraception: Secondary | ICD-10-CM

## 2024-03-05 DIAGNOSIS — Z3041 Encounter for surveillance of contraceptive pills: Secondary | ICD-10-CM

## 2024-03-05 LAB — POCT URINALYSIS DIP (DEVICE)
Bilirubin Urine: NEGATIVE
Glucose, UA: NEGATIVE mg/dL
Ketones, ur: NEGATIVE mg/dL
Nitrite: NEGATIVE
Protein, ur: NEGATIVE mg/dL
Specific Gravity, Urine: >=1.03 (ref 1.005–1.030)
Urobilinogen, UA: 0.2 mg/dL (ref 0.0–1.0)
pH: 6 (ref 5.0–8.0)

## 2024-03-05 MED ORDER — NORETHINDRONE 0.35 MG PO TABS: 1.0000 | ORAL_TABLET | Freq: Every day | ORAL | 3 refills | Status: AC

## 2024-03-05 NOTE — Progress Notes (Signed)
 "   GYNECOLOGY ANNUAL PREVENTATIVE CARE ENCOUNTER NOTE  Subjective:   Deborah Lawson is a 33 y.o. [id number] female here for a annual gynecologic exam. Current complaints: needs refill on POP.  Wants to discuss an alternative method of contraception, doesn't necessarily want to change today but wants to know options. Concerned about worsening migraines, she takes gabapentin  for migraines and states she was put on POP to avoid migraines. Had worsening migraines on nexplanon.   Not getting periods, breastfeeding baby born 12/2022 and taking POPs.  Denies abnormal vaginal bleeding, discharge, pelvic pain, or other gynecologic concerns. Declines STI screen.  Having dysuria for 2 days.   Gynecologic History No LMP recorded. (Menstrual status: Oral contraceptives). Contraception: oral progesterone-only contraceptive Last Pap: 02/2023. Results: normal Last mammogram: n/a Gardisil: has not received  Obstetric History OB History  Gravida Para Term Preterm AB Living  5 4 4  0 1 4  SAB IAB Ectopic Multiple Live Births  1 0 0 0 4    # Outcome Date GA Lbr Len/2nd Weight Sex Type Anes PTL Lv  5 Term 01/10/23 [redacted]w[redacted]d  7 lb 4.1 oz (3.29 kg) F CS-LTranv Spinal  LIV  4 Term 10/26/19 [redacted]w[redacted]d  6 lb 15 oz (3.147 kg) F CS-LTranv Spinal  LIV     Complications: Breech presentation  3 SAB           2 Term      Vag-Spont     1 Term      Vag-Spont       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   Vitamin D  deficiency 11/11/2020    Past Surgical History:  Procedure Laterality Date   CESAREAN SECTION N/A 10/26/2019   Procedure: CESAREAN SECTION;  Surgeon: Fredirick Glenys RAMAN, MD;  Location: [facility];  Service: Obstetrics;  Laterality: N/A;   CESAREAN SECTION N/A  01/10/2023   Procedure: CESAREAN SECTION;  Surgeon: Cleatus Moccasin, MD;  Location: [facility];  Service: Obstetrics;  Laterality: N/A;   LAPAROSCOPIC APPENDECTOMY N/A 02/05/2023   Procedure: APPENDECTOMY LAPAROSCOPIC;  Surgeon: Teresa Lonni HERO, MD;  Location: [facility] OR;  Service: General;  Laterality: N/A;   LAPAROSCOPIC LYSIS OF ADHESIONS N/A 02/05/2023   Procedure: LAPAROSCOPIC LYSIS OF ADHESIONS;  Surgeon: Teresa Lonni HERO, MD;  Location: [facility] OR;  Service: General;  Laterality: N/A;   MANDIBLE SURGERY      Medications Ordered Prior to Encounter[1]  Allergies[2]  Social History   Socioeconomic History   Marital status: Married    Spouse name: Not on file   Number of children: Not on file   Years of education: Not on file   Highest education level: GED or equivalent  Occupational History   Not on file  Tobacco Use   Smoking status: Never    Passive exposure: Never   Smokeless tobacco: Never  Vaping Use   Vaping status: Never Used  Substance and Sexual Activity   Alcohol use: Never   Drug use: Never   Sexual activity: Not Currently    Birth control/protection: None    Comment: last ic 3 days ago  Other Topics Concern   Not on file  Social History Narrative   Right handed   Social Drivers of Health   Tobacco Use:  Low Risk (02/23/2024)   Patient History    Smoking Tobacco Use: Never    Smokeless Tobacco Use: Never    Passive Exposure: Never  Financial Resource Strain: Low Risk (01/17/2024)   Overall Financial Resource Strain (CARDIA)    Difficulty of Paying Living Expenses: Not hard at all  Food Insecurity: No Food Insecurity (01/17/2024)   Epic    Worried About Programme Researcher, Broadcasting/film/video in the Last Year: Never true    Ran Out of Food in the Last Year: Never true  Transportation Needs: No Transportation Needs (01/17/2024)   Epic    Lack of Transportation (Medical): No    Lack of Transportation (Non-Medical): No  Physical Activity: Inactive (01/17/2024)   Exercise  Vital Sign    Days of Exercise per Week: 0 days    Minutes of Exercise per Session: Not on file  Stress: No Stress Concern Present (01/17/2024)   [employer] of Occupational Health - Occupational Stress Questionnaire    Feeling of Stress: Not at all  Social Connections: Moderately Isolated (01/17/2024)   Social Connection and Isolation Panel    Frequency of Communication with Friends and Family: Twice a week    Frequency of Social Gatherings with Friends and Family: Twice a week    Attends Religious Services: Never    Database Administrator or Organizations: No    Attends Engineer, Structural: Not on file    Marital Status: Married  Catering Manager Violence: Not At Risk (03/17/2022)   Humiliation, Afraid, Rape, and Kick questionnaire    Fear of Current or Ex-Partner: No    Emotionally Abused: No    Physically Abused: No    Sexually Abused: No  Depression (PHQ2-9): Low Risk (03/05/2024)   Depression (PHQ2-9)    PHQ-2 Score: 0  Alcohol Screen: Not on file  Housing: Unknown (01/17/2024)   Epic    Unable to Pay for Housing in the Last Year: No    Number of Times Moved in the Last Year: Not on file    Homeless in the Last Year: No  Utilities: Low Risk (08/19/2022)   Received from [facility]   Utilities    In the past 12 months has the electric, gas, oil, or water  company threatened to shut off services in your home? : No  Health Literacy: Not on file    Family History  Problem Relation Age of Onset   Allergic rhinitis Mother    Hypertension Father      The following portions of the patient's history were reviewed and updated as appropriate: allergies, current medications, past family history, past medical history, past social history, past surgical history and problem list.  Review of Systems Pertinent items are noted in HPI.   Objective:  BP 112/81   Pulse 90   Wt 199 lb (90.3 kg)   Breastfeeding Yes   BMI 33.12 kg/m  CONSTITUTIONAL: Well-developed,  well-nourished female in no acute distress.  HENT:  Normocephalic, atraumatic, External right and left ear normal. Oropharynx is clear and moist EYES: Conjunctivae and EOM are normal. Pupils are equal, round, and reactive to light. No scleral icterus.  NECK: Normal range of motion, supple, no masses.  Normal thyroid .  SKIN: Skin is warm and dry. No rash noted. Not diaphoretic. No erythema. No pallor. NEUROLOGIC: Alert and oriented to person, place, and time. Normal reflexes, muscle tone coordination. No cranial nerve deficit noted. PSYCHIATRIC: Normal mood and affect. Normal behavior. Normal judgment and thought content. CARDIOVASCULAR:  Normal heart rate noted RESPIRATORY: Effort normal, no problems with respiration noted. BREASTS: Symmetric in size. No masses, skin changes, nipple drainage, or lymphadenopathy. ABDOMEN: Soft, no distention noted.  No tenderness, rebound or guarding.  PELVIC: deferred MUSCULOSKELETAL: Normal range of motion. No tenderness.  No cyanosis, clubbing, or edema.   Exam done with chaperone present.  Assessment and Plan:   1. Abnormal urinalysis (Primary) - Urine Culture  2. Well woman exam Got flu shot with primary care  3. Encounter for counseling regarding contraception -Currently taking POPs for contraception, she is very concerned about worsening her migraines -h/o gHTN, would not recommend estrogen containing contraception -Reviewed that most progesterone only contraception options do not commonly worsen migraines but it is a risk, reviewed option for Slynd (alternate progesterone only pill), depo-provera, nexplanon, levonorgestrel IUD and non-hormonal IUD) - she wants to discuss with her neurologist but will consider options - wants to complete 2 years breastfeeding prior to changing contraception  4. Encounter for breast cancer screening using non-mammogram modality Reviewed breast cancer screening and starting mammograms age 89   Encouraged  improvement in diet and exercise.    Routine preventative health maintenance measures emphasized. Please refer to After Visit Summary for other counseling recommendations.     LOIS Yolanda Moats, MD, [facility] Attending [facility])     [1]  Current Outpatient Medications on File Prior to Visit  Medication Sig Dispense Refill   amoxicillin  (AMOXIL ) 500 MG capsule Take 1 capsule (500 mg total) by mouth 3 (three) times daily. 21 capsule 0   EPINEPHrine  (EPIPEN  2-PAK) 0.3 mg/0.3 mL IJ SOAJ injection Inject 0.3 mg into the muscle as needed for anaphylaxis. 0.3 mL 1   fluticasone  (FLONASE ) 50 MCG/ACT nasal spray Place into both nostrils.     gabapentin  (NEURONTIN ) 300 MG capsule Take 1 capsule (300 mg total) by mouth at bedtime. 30 capsule 5   RESTASIS 0.05 % ophthalmic emulsion 1 drop 2 (two) times daily.     Vitamin D , Ergocalciferol , (DRISDOL ) 1.25 MG (50000 UNIT) CAPS capsule Take 1 capsule (50,000 Units total) by mouth every 7 (seven) days for 12 doses. 12 capsule 0   acetaminophen  (TYLENOL ) 325 MG tablet Take 2 tablets (650 mg total) by mouth every 6 (six) hours as needed. 30 tablet 0   No current facility-administered medications on file prior to visit.  [2]  Allergies Allergen Reactions   Porcine (Pork) Protein-Containing Drug Products Other (See Comments)    Religious   "

## 2024-03-07 LAB — URINE CULTURE

## 2024-03-09 NOTE — Progress Notes (Signed)
 "  NEUROLOGY FOLLOW UP OFFICE NOTE  Deborah Lawson [id number]  Assessment/Plan:   Atypical headache/facial pain, initially left sided but now reports right sided pain as well, unclear etiology- MRI brain/trigeminal nerves/cervical spine and CT sinuses do not reveal etiology.  She does have tenderness in the right TMJ, raising possibility of TMJ dysfunction.  She states that she may grind her teeth in her sleep      Gabapentin  300mg  three times weekly Limit use of pain relievers to no more than 9 days out of the month to prevent risk of rebound or medication-overuse headache. Follow up 6 months.   Total time spent in chart and face to face with patient:  40 minutes.    Subjective:  Deborah Lawson is a 33 year old female who follows up for atypical facial pain.     UPDATE: Currently breastfeeding.  Taking gabapentin  but only 300mg  at bedtime.  Sometimes if she does not sleep well or feels tired, she has left sided facial pain  Current NSAIDS/analgesics:  acetaminophen  Current triptans:  none Current ergotamine:  none Current anti-emetic:  none Current muscle relaxants:  none Current Antihypertensive medications:  none Current Antidepressant medications:  none Current Anticonvulsant medications:  none Current anti-CGRP:  none Current Vitamins/Herbal/Supplements:  none Current Antihistamines/Decongestants:  none Other therapy:  none Hormone/birth control:  Camila    HISTORY: She started having a headache around January 2024.  No prior history of headache.  It is a moderate to severe left sided pressure/aching pain involving the left temple, eye, ear and face, including along the jaw and behind TMJ.  Sometimes with associated left sided/occipital numbness and tingling.  It comes and goes during the day the pain lessens and resolves by next day.  They occurs when she feels stressed or depressed, about 3 to 4 days a week.  Sometimes associated neck pain.  No associated nausea,  vomiting, photophobia, phonophobia, visual disturbance, autonomic symptoms, numbness or weakness.  On occasion may have brief visual disturbance of seeing floaters for a couple of seconds with the headache.  She went to the ED where she was diagnosed with a cavity.  She had the tooth pulled but pain persisted.  She was seen in the ED on 04/23/2020 where CT head personally reviewed was unremarkable and she was treated with headache cocktail.  She returned to the ED on 05/31/2020 for left sided TMJ pain where she was prescribed a prednisone  taper and instructed to take Motrin  as needed.    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  She was referred to endocrinology. She saw the dentist and had the cavity treated. Noted some improvement.  .  Referred to PT for neck/TMJ pain.  Still with neck pain radiating down left arm.  In March 2023, she was treated for left otitis media.  Still has left ear pain..  Treated for sinusitis and left otitis media.  Still with pain in left ear.  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  CT sinuses on 09/21/2021 revealed resolution of previous left maxillary sinusitis. She saw endocrinology regarding the pituitary microadenoma with normal workup.  In March 2024, she endorsed right sided stabbing pain involving the face, head and neck.  CT sinuses on 08/23/2022 revealed small volume of  frothy secretions in the left sphenoid sinus but otherwise patent sinuses.  MRI of trigeminal nerve without contrast (pregnant) on 09/17/2022 was negative.  Sometimes facial pain comes and goes.  She saw her dentist in December 2024 who took images and told her that her teeth looked fine.  Headaches improved.  Due to ongoing cervical radiculitis, MRI of  cervical spine was performed on 07/22/2021, which showed mild cervical spondylosis but no significant spinal canal or foraminal stenosis.  NCV-EMG of left upper extremity on 02/11/2022 was normal.  Because her headache became left sided, she had MRI of brain without contrast on 03/12/2023 was unremarkable.    She has history of chronic low back pain radiating down the left leg.  She has had worsening left leg pain with anterior numbness from the thigh down to the foot.  No trauma.     Past NSAIDS/analgesics:  ibuprofen  Past abortive triptans:  none Past abortive ergotamine:  none Past muscle relaxants:  none Past anti-emetic:  none Past antihypertensive medications:  nifedipine  Past antidepressant medications:  none Past anticonvulsant medications:  gabapentin  600mg  BID (stopped due to pregnancy) Past anti-CGRP:  none Past vitamins/Herbal/Supplements:  none Past antihistamines/decongestants:  none Other past therapies:  none  PAST MEDICAL HISTORY: 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 30 tablet 0   amoxicillin  (AMOXIL ) 500 MG capsule Take 1 capsule (500 mg total) by mouth 3 (three) times daily. 21 capsule 0   EPINEPHrine  (EPIPEN  2-PAK) 0.3 mg/0.3 mL IJ SOAJ injection Inject 0.3 mg into the muscle as needed for anaphylaxis. 0.3 mL 1   fluticasone  (FLONASE ) 50 MCG/ACT nasal spray Place into both nostrils.     gabapentin  (NEURONTIN ) 300 MG capsule Take 1 capsule (300 mg total) by mouth at bedtime. 30  capsule 5   norethindrone  (MICRONOR ) 0.35 MG tablet Take 1 tablet (0.35 mg total) by mouth daily. 84 tablet 3   RESTASIS 0.05 % ophthalmic emulsion 1 drop 2 (two) times daily.     Vitamin D , Ergocalciferol , (DRISDOL ) 1.25 MG (50000 UNIT) CAPS capsule Take 1 capsule (50,000 Units total) by mouth every 7 (seven) days for 12 doses. 12 capsule 0   No current facility-administered medications on file prior to visit.    ALLERGIES: Allergies  Allergen Reactions   Porcine (Pork) Protein-Containing Drug Products Other (See Comments)    Religious    FAMILY HISTORY: Family History  Problem Relation Age of Onset   Allergic rhinitis Mother    Hypertension Father       Objective:  Blood pressure 108/74, pulse 97, weight 202 lb (91.6 kg), SpO2 92%, currently breastfeeding. General: No acute distress.  Patient appears well-groomed.   Head:  Normocephalic/atraumatic Neck:  Supple.  No paraspinal tenderness.  Full range of motion. Heart:  Regular rate and rhythm. Neuro:  Alert and oriented.  Speech fluent and not dysarthric.  Language intact.  CN II-XII intact.  Bulk and tone normal.  Muscle strength 5/5 throughout.  Sensation to light touch intact.  Deep tendon reflexes 2+ throughout,  toes downgoing.  Gait normal.  Romberg negative.    Juliene Dunnings, DO  CC: Tully Nap Acosta,MD     "

## 2024-03-12 ENCOUNTER — Encounter: Payer: Self-pay | Admitting: Neurology

## 2024-03-12 ENCOUNTER — Ambulatory Visit (INDEPENDENT_AMBULATORY_CARE_PROVIDER_SITE_OTHER): Admitting: Neurology

## 2024-03-12 ENCOUNTER — Ambulatory Visit: Payer: Self-pay | Admitting: Obstetrics and Gynecology

## 2024-03-12 VITALS — BP 108/74 | HR 97 | Wt 202.0 lb

## 2024-03-12 DIAGNOSIS — G501 Atypical facial pain: Secondary | ICD-10-CM | POA: Diagnosis not present

## 2024-03-12 MED ORDER — GABAPENTIN 300 MG PO CAPS: ORAL_CAPSULE | ORAL | 5 refills | Status: AC

## 2024-03-12 NOTE — Patient Instructions (Signed)
 Gabapentin  300mg  three times a week (Monday, Wednesday and Friday)

## 2024-03-24 ENCOUNTER — Other Ambulatory Visit: Payer: Self-pay | Admitting: Internal Medicine

## 2024-03-24 ENCOUNTER — Inpatient Hospital Stay (HOSPITAL_COMMUNITY): Admission: RE | Admit: 2024-03-24 | Discharge: 2024-03-24

## 2024-03-24 ENCOUNTER — Encounter (HOSPITAL_COMMUNITY): Payer: Self-pay

## 2024-03-24 VITALS — BP 140/89 | HR 98 | Temp 98.2°F | Resp 17

## 2024-03-24 DIAGNOSIS — M545 Low back pain, unspecified: Secondary | ICD-10-CM | POA: Diagnosis present

## 2024-03-24 DIAGNOSIS — R5383 Other fatigue: Secondary | ICD-10-CM

## 2024-03-24 DIAGNOSIS — N3001 Acute cystitis with hematuria: Secondary | ICD-10-CM | POA: Diagnosis present

## 2024-03-24 LAB — POCT URINALYSIS DIP (MANUAL ENTRY)
Bilirubin, UA: NEGATIVE
Glucose, UA: NEGATIVE mg/dL
Nitrite, UA: NEGATIVE
Protein Ur, POC: NEGATIVE mg/dL
Spec Grav, UA: 1.025
Urobilinogen, UA: 0.2 U/dL
pH, UA: 5.5

## 2024-03-24 MED ORDER — AMOXICILLIN-POT CLAVULANATE 875-125 MG PO TABS: 1.0000 | ORAL_TABLET | Freq: Two times a day (BID) | ORAL | 0 refills | Status: AC

## 2024-03-24 MED ORDER — ONDANSETRON 4 MG PO TBDP: 4.0000 mg | ORAL_TABLET | Freq: Three times a day (TID) | ORAL | 0 refills | Status: AC | PRN

## 2024-03-24 NOTE — ED Triage Notes (Signed)
 Pt has c/o lower back pain x 3 days, Pt also states has some dysuria as well and odor from urine. Pt denies medication use at home.

## 2024-03-24 NOTE — Discharge Instructions (Addendum)
 Your urine shows you possibly have a urinary tract infection.   I have sent your urine for culture to confirm this.   We will call you if we need to change your antibiotic when we find out the type of bacteria growing in your bladder.  Take Augmentin  (antibiotic) as directed with a snack/food to avoid stomach upset. To avoid GI upset please take this medication with food.  Take ondansetron  every 8 hours as needed for nausea, vomiting.  Avoid drinking beverages that irritate the urinary tract like sodas, tea, coffee, or juice. Drink plenty of water  to stay well hydrated and prevent severe infection.  Your back pain is likely due to a muscle strain which will improve on its own with time.   You may take tylenol  as needed for aches and pains.  Apply heat to the pulled muscle 20 minutes on 20 minutes off as needed, heat relaxes muscles.  Perform gentle exercises and stretches to area of tenderness.  I would like for you to rest, however I do not want you to avoid moving the area. Movement and stretching will help with healing.  Red flag symptoms to watch out for are numbness/tingling to the legs, weakness, loss of bowel/bladder control, and/or worsening pain that does not respond well to medicines.  Follow-up with your primary care provider or return to urgent care if your symptoms do not improve in the next 3 to 4 days with medications and interventions recommended today. If your symptoms are severe (red flag), please go to the emergency room.   If you develop pain in your upper back on one side, fever despite taking antibiotic, nausea and vomiting where you cannot keep anything down for 24 hours, dizziness/severe headache, or decreased urinary output, please go to the ER. Follow-up with PCP.

## 2024-03-24 NOTE — ED Provider Notes (Signed)
 " [facility]    CSN: [id number] Arrival date & time: 03/24/24  1740      History   Chief Complaint Chief Complaint  Patient presents with   Back Pain    Entered by patient    HPI Deborah Lawson is Lawson 34 y.o. female.   This 34 year old female is being seen for complaints of fatigue, nausea, bilateral low back pain radiating to the left and right side, dysuria, frequency, malodorous urine for 3 days.  She has not taken anything for symptoms.  She denies fever, chills, headache, dizziness.  She denies chest pain, shortness of breath.  She denies abdominal pain, vomiting, diarrhea.  She denies numbness, tingling, weakness in extremities.  She denies injury, trauma, falls.  She has 4 children at bedside with her today.  She is currently breast-feeding.  No language interpreter was used (Patient declines).  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   Seasonal and perennial allergic rhinitis 04/28/2023   Gestational hypertension 01/11/2023   Pregnancy 01/10/2023   Cesarean delivery delivered 01/10/2023   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   History of cesarean delivery, currently pregnant 07/07/2022   Supervision of high risk pregnancy, antepartum 06/23/2022   White matter abnormality on MRI of brain 07/06/2021   Vitamin D  deficiency 11/11/2020   Iron  deficiency 11/11/2020    History of gestational diabetes in prior pregnancy, currently pregnant    History of gestational hypertension    Language barrier 10/12/2019    Past Surgical History:  Procedure Laterality Date   CESAREAN SECTION N/Lawson 10/26/2019   Procedure: CESAREAN SECTION;  Surgeon: Fredirick Glenys RAMAN, MD;  Location: [facility];  Service: Obstetrics;  Laterality: N/Lawson;   CESAREAN SECTION N/Lawson 01/10/2023   Procedure: CESAREAN SECTION;  Surgeon: Cleatus Moccasin, MD;  Location: [facility];  Service: Obstetrics;  Laterality: N/Lawson;   LAPAROSCOPIC APPENDECTOMY N/Lawson 02/05/2023   Procedure: APPENDECTOMY LAPAROSCOPIC;  Surgeon: Teresa Lonni HERO, MD;  Location: [facility];  Service: General;  Laterality: N/Lawson;   LAPAROSCOPIC LYSIS OF ADHESIONS N/Lawson 02/05/2023   Procedure: LAPAROSCOPIC LYSIS OF ADHESIONS;  Surgeon: Teresa Lonni HERO, MD;  Location: [facility];  Service: General;  Laterality: N/Lawson;   MANDIBLE SURGERY      OB History     Gravida  5   Para  4   Term  4   Preterm  0   AB  1   Living  4      SAB  1   IAB  0   Ectopic  0   Multiple  0   Live Births  4            Home Medications    Prior to Admission medications  Medication Sig Start Date End Date Taking? Authorizing Provider  amoxicillin -clavulanate (AUGMENTIN ) 875-125 MG tablet Take 1 tablet by mouth every 12 (twelve) hours for 5 days. 03/24/24 03/29/24 Yes  Deborah Nordstrom C, FNP  ondansetron  (ZOFRAN -ODT) 4 MG disintegrating tablet Take 1 tablet (4 mg total) by mouth every 8 (eight) hours as needed for nausea or vomiting. 03/24/24  Yes Deborah Whitson C, FNP  acetaminophen  (TYLENOL ) 325 MG tablet Take 2 tablets (650 mg total) by mouth every 6 (six) hours as needed. 08/30/23   Deborah Flaming A, NP  EPINEPHrine  (EPIPEN  2-PAK) 0.3 mg/0.3 mL IJ SOAJ injection Inject 0.3 mg into the muscle as needed for anaphylaxis. 03/17/23   Deborah Marty Saltness, MD  fluticasone  (FLONASE ) 50 MCG/ACT nasal spray Place into both nostrils. 12/21/22   [provider]  gabapentin  (NEURONTIN ) 300 MG capsule Take 1 capsule three times Lawson week (Monday, Wednesday, Friday) 03/12/24   Deborah Juliene SAUNDERS, DO  norethindrone  (MICRONOR ) 0.35 MG tablet Take 1 tablet (0.35 mg total) by mouth daily. 03/05/24   Deborah Burnard HERO, MD  RESTASIS 0.05 % ophthalmic emulsion 1 drop 2 (two) times daily. 03/22/23   [provider]  Vitamin D , Ergocalciferol , (DRISDOL ) 1.25 MG (50000 UNIT) CAPS capsule Take 1 capsule (50,000 Units total) by mouth every 7 (seven) days for 12 doses. 01/19/24 04/06/24  Deborah Delma Tully CINDERELLA, MD    Family History Family History  Problem Relation Age of Onset   Allergic rhinitis Mother    Hypertension Father     Social History Social History[1]   Allergies   Porcine (pork) protein-containing drug products   Review of Systems Review of Systems  Constitutional:  Positive for fatigue. Negative for activity change, chills and fever.  Respiratory:  Negative for shortness of breath.   Cardiovascular:  Negative for chest pain.  Gastrointestinal:  Positive for nausea. Negative for abdominal pain, diarrhea and vomiting.  Genitourinary:  Positive for dysuria, frequency and urgency. Negative for difficulty urinating.  Musculoskeletal:  Positive for back pain.  Neurological:  Negative for dizziness and headaches.  All other systems reviewed and are negative.    Physical Exam Triage Vital Signs ED Triage Vitals  Encounter Vitals Group     BP 03/24/24 1831 (!) 140/89     Girls Systolic BP Percentile --      Girls Diastolic BP Percentile --      Boys Systolic BP Percentile --      Boys Diastolic BP Percentile --      Pulse Rate 03/24/24 1831 98     Resp 03/24/24 1831 17     Temp 03/24/24 1831 98.2 F (36.8 Lawson)     Temp Source 03/24/24 1831 Oral     SpO2 03/24/24 1831 96 %     Weight --      Height --      Head Circumference --      Peak Flow --      Pain Score 03/24/24 1829 8     Pain Loc --      Pain Education --       Exclude from Growth Chart --    No data found.  Updated Vital Signs BP (!) 140/89 (BP Location: Right Arm)   Pulse 98   Temp 98.2 F (36.8 Lawson) (Oral)   Resp 17   SpO2 96%   Visual Acuity Right Eye Distance:   Left Eye Distance:   Bilateral Distance:    Right Eye Near:   Left Eye Near:    Bilateral Near:     Physical Exam Vitals and nursing note reviewed.  Constitutional:      General: She is not in acute  distress.    Appearance: She is well-developed. She is not toxic-appearing.     Comments: Pleasant female appearing stated age found sitting in chair in no acute distress.  HENT:     Head: Normocephalic and atraumatic.     Mouth/Throat:     Lips: Pink.  Eyes:     Conjunctiva/sclera: Conjunctivae normal.  Cardiovascular:     Rate and Rhythm: Normal rate and regular rhythm.     Heart sounds: Normal heart sounds. No murmur heard. Pulmonary:     Effort: Pulmonary effort is normal. No respiratory distress.     Breath sounds: Normal breath sounds.  Abdominal:     General: Bowel sounds are normal.     Palpations: Abdomen is soft.     Tenderness: There is no abdominal tenderness.  Musculoskeletal:     Cervical back: Normal.     Thoracic back: Normal.     Lumbar back: Tenderness present.       Back:  Skin:    General: Skin is warm and dry.  Neurological:     Mental Status: She is alert.  Psychiatric:        Mood and Affect: Mood normal.      UC Treatments / Results  Labs (all labs ordered are listed, but only abnormal results are displayed) Labs Reviewed  POCT URINALYSIS DIP (MANUAL ENTRY) - Abnormal; Notable for the following components:      Result Value   Clarity, UA cloudy (*)    Ketones, POC UA trace (5) (*)    Blood, UA small (*)    Leukocytes, UA Trace (*)    All other components within normal limits  URINE CULTURE    EKG   Radiology No results found.  Procedures Procedures (including critical care time)  Medications Ordered in  UC Medications - No data to display  Initial Impression / Assessment and Plan / UC Course  I have reviewed the triage vital signs and the nursing notes.  Pertinent labs & imaging results that were available during my care of the patient were reviewed by me and considered in my medical decision making (see chart for details).     Vitals and triage reviewed, patient is hemodynamically stable.  UA collected which reveals cloudy urine with trace ketones, small blood, trace leukocytes.  She reports symptoms consistent with urinary tract infection.  She is currently breast-feeding.  Prescription given for Augmentin , ondansetron .  Urine is sent for culture.  Suspect lumbar strain.  She is advised Tylenol , heat application, gentle stretches.  Plan of care, follow-up care, return precautions given, no questions at this time. Final Clinical Impressions(s) / UC Diagnoses   Final diagnoses:  Acute bilateral low back pain without sciatica  Acute cystitis with hematuria     Discharge Instructions      Your urine shows you possibly have Lawson urinary tract infection.   I have sent your urine for culture to confirm this.   We will call you if we need to change your antibiotic when we find out the type of bacteria growing in your bladder.  Take Augmentin  (antibiotic) as directed with Lawson snack/food to avoid stomach upset. To avoid GI upset please take this medication with food.  Take ondansetron  every 8 hours as needed for nausea, vomiting.  Avoid drinking beverages that irritate the urinary tract like sodas, tea, coffee, or juice. Drink plenty of water  to stay well hydrated and prevent severe infection.  Your back pain is likely due to Lawson  muscle strain which will improve on its own with time.   You may take tylenol  as needed for aches and pains.  Apply heat to the pulled muscle 20 minutes on 20 minutes off as needed, heat relaxes muscles.  Perform gentle exercises and stretches to area of  tenderness.  I would like for you to rest, however I do not want you to avoid moving the area. Movement and stretching will help with healing.  Red flag symptoms to watch out for are numbness/tingling to the legs, weakness, loss of bowel/bladder control, and/or worsening pain that does not respond well to medicines.  Follow-up with your primary care provider or return to urgent care if your symptoms do not improve in the next 3 to 4 days with medications and interventions recommended today. If your symptoms are severe (red flag), please go to the emergency room.   If you develop pain in your upper back on one side, fever despite taking antibiotic, nausea and vomiting where you cannot keep anything down for 24 hours, dizziness/severe headache, or decreased urinary output, please go to the ER. Follow-up with PCP.       ED Prescriptions     Medication Sig Dispense Auth. Provider   amoxicillin -clavulanate (AUGMENTIN ) 875-125 MG tablet Take 1 tablet by mouth every 12 (twelve) hours for 5 days. 10 tablet Deborah Frier C, FNP   ondansetron  (ZOFRAN -ODT) 4 MG disintegrating tablet Take 1 tablet (4 mg total) by mouth every 8 (eight) hours as needed for nausea or vomiting. 10 tablet Deborah Morss C, FNP      PDMP not reviewed this encounter.    [1]  Social History Tobacco Use   Smoking status: Never    Passive exposure: Never   Smokeless tobacco: Never  Vaping Use   Vaping status: Never Used  Substance Use Topics   Alcohol use: Never   Drug use: Never     Deborah Jon BROCKS, FNP 03/24/24 1918  "

## 2024-03-26 LAB — URINE CULTURE

## 2024-03-27 ENCOUNTER — Ambulatory Visit (HOSPITAL_COMMUNITY): Payer: Self-pay

## 2024-04-02 ENCOUNTER — Ambulatory Visit: Payer: Self-pay | Admitting: Neurology

## 2024-04-12 ENCOUNTER — Ambulatory Visit

## 2024-04-12 DIAGNOSIS — J302 Other seasonal allergic rhinitis: Secondary | ICD-10-CM

## 2024-07-24 ENCOUNTER — Ambulatory Visit: Admitting: Allergy & Immunology

## 2024-08-21 ENCOUNTER — Ambulatory Visit: Admitting: Neurology

## 2024-12-13 ENCOUNTER — Ambulatory Visit: Admitting: Internal Medicine
